# Patient Record
Sex: Female | Born: 1935
Health system: Southern US, Community
[De-identification: ages and names within clinical notes are randomized; demographics above are authoritative.]

## PROBLEM LIST (undated history)

## (undated) DIAGNOSIS — E785 Hyperlipidemia, unspecified: Secondary | ICD-10-CM

## (undated) DIAGNOSIS — K219 Gastro-esophageal reflux disease without esophagitis: Secondary | ICD-10-CM

## (undated) DIAGNOSIS — G8929 Other chronic pain: Secondary | ICD-10-CM

## (undated) DIAGNOSIS — F0390 Unspecified dementia without behavioral disturbance: Secondary | ICD-10-CM

## (undated) DIAGNOSIS — I10 Essential (primary) hypertension: Secondary | ICD-10-CM

## (undated) DIAGNOSIS — R519 Headache, unspecified: Secondary | ICD-10-CM

## (undated) DIAGNOSIS — C4490 Unspecified malignant neoplasm of skin, unspecified: Secondary | ICD-10-CM

## (undated) DIAGNOSIS — I219 Acute myocardial infarction, unspecified: Secondary | ICD-10-CM

## (undated) DIAGNOSIS — J449 Chronic obstructive pulmonary disease, unspecified: Secondary | ICD-10-CM

## (undated) DIAGNOSIS — I639 Cerebral infarction, unspecified: Secondary | ICD-10-CM

## (undated) DIAGNOSIS — E079 Disorder of thyroid, unspecified: Secondary | ICD-10-CM

## (undated) DIAGNOSIS — C801 Malignant (primary) neoplasm, unspecified: Secondary | ICD-10-CM

## (undated) HISTORY — DX: Chronic obstructive pulmonary disease, unspecified: J44.9

## (undated) HISTORY — DX: Other chronic pain: G89.29

## (undated) HISTORY — DX: Gastro-esophageal reflux disease without esophagitis: K21.9

## (undated) HISTORY — DX: Cerebral infarction, unspecified: I63.9

## (undated) HISTORY — DX: Headache, unspecified: R51.9

## (undated) HISTORY — DX: Essential (primary) hypertension: I10

## (undated) HISTORY — DX: Malignant (primary) neoplasm, unspecified: C80.1

## (undated) HISTORY — DX: Unspecified malignant neoplasm of skin, unspecified: C44.90

## (undated) HISTORY — DX: Disorder of thyroid, unspecified: E07.9

## (undated) HISTORY — DX: Unspecified dementia, unspecified severity, without behavioral disturbance, psychotic disturbance, mood disturbance, and anxiety: F03.90

## (undated) HISTORY — DX: Acute myocardial infarction, unspecified: I21.9

## (undated) HISTORY — DX: Hyperlipidemia, unspecified: E78.5

---

## 1973-01-25 HISTORY — PX: DILATION AND CURETTAGE OF UTERUS: SHX78

## 1991-01-26 HISTORY — PX: GALLBLADDER SURGERY: SHX652

## 1993-01-25 HISTORY — PX: BREAST LUMPECTOMY: SHX2

## 1996-01-26 DIAGNOSIS — I219 Acute myocardial infarction, unspecified: Secondary | ICD-10-CM

## 1996-01-26 HISTORY — DX: Acute myocardial infarction, unspecified: I21.9

## 1997-12-09 ENCOUNTER — Other Ambulatory Visit: Admission: RE | Admit: 1997-12-09 | Discharge: 1997-12-09 | Payer: Self-pay | Admitting: Family Medicine

## 1998-06-17 ENCOUNTER — Inpatient Hospital Stay (HOSPITAL_COMMUNITY): Admission: EM | Admit: 1998-06-17 | Discharge: 1998-06-20 | Payer: Self-pay | Admitting: Emergency Medicine

## 1998-06-17 ENCOUNTER — Encounter: Payer: Self-pay | Admitting: Family Medicine

## 1998-06-18 ENCOUNTER — Encounter: Payer: Self-pay | Admitting: General Surgery

## 1998-12-11 ENCOUNTER — Other Ambulatory Visit: Admission: RE | Admit: 1998-12-11 | Discharge: 1998-12-11 | Payer: Self-pay | Admitting: Family Medicine

## 1999-01-05 ENCOUNTER — Encounter: Admission: RE | Admit: 1999-01-05 | Discharge: 1999-01-05 | Payer: Self-pay | Admitting: Family Medicine

## 1999-01-05 ENCOUNTER — Encounter: Payer: Self-pay | Admitting: Family Medicine

## 2000-01-07 ENCOUNTER — Encounter: Payer: Self-pay | Admitting: Family Medicine

## 2000-01-07 ENCOUNTER — Encounter: Admission: RE | Admit: 2000-01-07 | Discharge: 2000-01-07 | Payer: Self-pay | Admitting: Family Medicine

## 2001-01-09 ENCOUNTER — Encounter: Admission: RE | Admit: 2001-01-09 | Discharge: 2001-01-09 | Payer: Self-pay | Admitting: Family Medicine

## 2001-01-09 ENCOUNTER — Encounter: Payer: Self-pay | Admitting: Family Medicine

## 2002-01-10 ENCOUNTER — Encounter: Admission: RE | Admit: 2002-01-10 | Discharge: 2002-01-10 | Payer: Self-pay | Admitting: Family Medicine

## 2002-01-10 ENCOUNTER — Encounter: Payer: Self-pay | Admitting: Family Medicine

## 2003-01-15 ENCOUNTER — Encounter: Payer: Self-pay | Admitting: Family Medicine

## 2003-05-21 ENCOUNTER — Inpatient Hospital Stay (HOSPITAL_COMMUNITY): Payer: Self-pay | Admitting: Internal Medicine

## 2004-01-07 ENCOUNTER — Ambulatory Visit: Payer: Self-pay | Admitting: Family Medicine

## 2004-01-24 ENCOUNTER — Encounter: Payer: Self-pay | Admitting: Family Medicine

## 2004-02-24 ENCOUNTER — Ambulatory Visit: Payer: Self-pay | Admitting: Family Medicine

## 2004-03-09 ENCOUNTER — Ambulatory Visit: Payer: Self-pay | Admitting: Internal Medicine

## 2004-03-23 ENCOUNTER — Ambulatory Visit: Payer: Self-pay | Admitting: Internal Medicine

## 2004-11-05 ENCOUNTER — Ambulatory Visit: Payer: Self-pay | Admitting: Family Medicine

## 2005-02-15 ENCOUNTER — Encounter: Payer: Self-pay | Admitting: Family Medicine

## 2005-02-22 ENCOUNTER — Ambulatory Visit: Payer: Self-pay | Admitting: Family Medicine

## 2005-07-19 ENCOUNTER — Ambulatory Visit: Payer: Self-pay | Admitting: Internal Medicine

## 2005-07-26 ENCOUNTER — Ambulatory Visit: Payer: Self-pay | Admitting: Family Medicine

## 2005-08-16 ENCOUNTER — Ambulatory Visit: Payer: Self-pay | Admitting: Family Medicine

## 2005-12-09 ENCOUNTER — Ambulatory Visit: Payer: Self-pay | Admitting: Family Medicine

## 2006-02-21 ENCOUNTER — Ambulatory Visit: Payer: Self-pay | Admitting: Family Medicine

## 2006-02-21 LAB — CONVERTED CEMR LAB
AST: 23 units/L (ref 0–37)
BUN: 12 mg/dL (ref 6–23)
Basophils Relative: 0.9 % (ref 0.0–1.0)
Direct LDL: 140.6 mg/dL
Eosinophils Absolute: 0.1 10*3/uL (ref 0.0–0.6)
HCT: 42.8 % (ref 36.0–46.0)
Hemoglobin: 14.7 g/dL (ref 12.0–15.0)
Lymphocytes Relative: 34.7 % (ref 12.0–46.0)
MCHC: 34.3 g/dL (ref 30.0–36.0)
Neutrophils Relative %: 55.2 % (ref 43.0–77.0)
Potassium: 3.6 meq/L (ref 3.5–5.1)
RBC: 4.5 M/uL (ref 3.87–5.11)
RDW: 13.7 % (ref 11.5–14.6)
TSH: 4.64 microintl units/mL (ref 0.35–5.50)
WBC: 5.6 10*3/uL (ref 4.5–10.5)

## 2006-02-28 ENCOUNTER — Encounter: Payer: Self-pay | Admitting: Family Medicine

## 2006-10-07 DIAGNOSIS — K219 Gastro-esophageal reflux disease without esophagitis: Secondary | ICD-10-CM | POA: Insufficient documentation

## 2006-10-07 DIAGNOSIS — F0391 Unspecified dementia with behavioral disturbance: Secondary | ICD-10-CM

## 2006-10-07 DIAGNOSIS — J449 Chronic obstructive pulmonary disease, unspecified: Secondary | ICD-10-CM | POA: Insufficient documentation

## 2006-10-07 DIAGNOSIS — I10 Essential (primary) hypertension: Secondary | ICD-10-CM

## 2006-10-07 DIAGNOSIS — J441 Chronic obstructive pulmonary disease with (acute) exacerbation: Secondary | ICD-10-CM | POA: Insufficient documentation

## 2006-10-21 ENCOUNTER — Telehealth: Payer: Self-pay | Admitting: Family Medicine

## 2006-11-17 ENCOUNTER — Ambulatory Visit: Payer: Self-pay | Admitting: Family Medicine

## 2006-12-29 ENCOUNTER — Ambulatory Visit: Payer: Self-pay | Admitting: Family Medicine

## 2006-12-30 DIAGNOSIS — J069 Acute upper respiratory infection, unspecified: Secondary | ICD-10-CM | POA: Insufficient documentation

## 2007-02-23 ENCOUNTER — Ambulatory Visit: Payer: Self-pay | Admitting: Family Medicine

## 2007-02-23 LAB — CONVERTED CEMR LAB
Alkaline Phosphatase: 93 units/L (ref 39–117)
BUN: 13 mg/dL (ref 6–23)
Basophils Absolute: 0 10*3/uL (ref 0.0–0.1)
Bilirubin Urine: NEGATIVE
Calcium: 9.8 mg/dL (ref 8.4–10.5)
Chloride: 105 meq/L (ref 96–112)
Eosinophils Relative: 2.1 % (ref 0.0–5.0)
GFR calc Af Amer: 56 mL/min
Hemoglobin: 14.4 g/dL (ref 12.0–15.0)
Ketones, urine, test strip: NEGATIVE
Lymphocytes Relative: 35.8 % (ref 12.0–46.0)
MCHC: 33.2 g/dL (ref 30.0–36.0)
Neutro Abs: 3.2 10*3/uL (ref 1.4–7.7)
Neutrophils Relative %: 53.1 % (ref 43.0–77.0)
Platelets: 170 10*3/uL (ref 150–400)
Protein, U semiquant: NEGATIVE
RDW: 13.7 % (ref 11.5–14.6)
Sodium: 147 meq/L — ABNORMAL HIGH (ref 135–145)
TSH: 3.87 microintl units/mL (ref 0.35–5.50)
Total Bilirubin: 0.8 mg/dL (ref 0.3–1.2)
Total Protein: 7.1 g/dL (ref 6.0–8.3)

## 2007-03-09 ENCOUNTER — Ambulatory Visit: Payer: Self-pay | Admitting: Family Medicine

## 2007-03-20 ENCOUNTER — Encounter: Payer: Self-pay | Admitting: Family Medicine

## 2007-04-25 ENCOUNTER — Emergency Department (HOSPITAL_COMMUNITY): Payer: Self-pay | Admitting: Emergency Medicine

## 2007-04-25 ENCOUNTER — Ambulatory Visit: Payer: Self-pay | Admitting: Family Medicine

## 2007-05-02 ENCOUNTER — Ambulatory Visit: Payer: Self-pay | Admitting: Family Medicine

## 2007-05-02 ENCOUNTER — Telehealth: Payer: Self-pay | Admitting: Family Medicine

## 2007-09-22 ENCOUNTER — Ambulatory Visit: Payer: Self-pay | Admitting: Internal Medicine

## 2007-09-22 DIAGNOSIS — G459 Transient cerebral ischemic attack, unspecified: Secondary | ICD-10-CM

## 2007-09-26 ENCOUNTER — Ambulatory Visit: Payer: Self-pay

## 2007-09-29 ENCOUNTER — Ambulatory Visit: Payer: Self-pay | Admitting: Family Medicine

## 2007-10-27 ENCOUNTER — Ambulatory Visit: Payer: Self-pay | Admitting: Family Medicine

## 2008-02-26 ENCOUNTER — Ambulatory Visit: Payer: Self-pay | Admitting: Family Medicine

## 2008-02-26 LAB — CONVERTED CEMR LAB
Glucose, Urine, Semiquant: NEGATIVE
Ketones, urine, test strip: NEGATIVE
Nitrite: NEGATIVE
Protein, U semiquant: NEGATIVE
pH: 5.5

## 2008-02-27 LAB — CONVERTED CEMR LAB
ALT: 17 units/L (ref 0–35)
Alkaline Phosphatase: 74 units/L (ref 39–117)
Bilirubin, Direct: 0.2 mg/dL (ref 0.0–0.3)
CO2: 29 meq/L (ref 19–32)
Calcium: 9.5 mg/dL (ref 8.4–10.5)
Eosinophils Relative: 2.2 % (ref 0.0–5.0)
Glucose, Bld: 92 mg/dL (ref 70–99)
Hemoglobin: 14.8 g/dL (ref 12.0–15.0)
Lymphocytes Relative: 32.2 % (ref 12.0–46.0)
Monocytes Relative: 6.1 % (ref 3.0–12.0)
Neutro Abs: 3.6 10*3/uL (ref 1.4–7.7)
Potassium: 3.9 meq/L (ref 3.5–5.1)
RDW: 13.7 % (ref 11.5–14.6)
Sodium: 141 meq/L (ref 135–145)
Total Protein: 7.2 g/dL (ref 6.0–8.3)
WBC: 6 10*3/uL (ref 4.5–10.5)

## 2008-03-28 ENCOUNTER — Encounter: Payer: Self-pay | Admitting: Family Medicine

## 2008-04-08 ENCOUNTER — Ambulatory Visit: Payer: Self-pay | Admitting: Family Medicine

## 2008-10-21 ENCOUNTER — Ambulatory Visit: Payer: Self-pay | Admitting: Family Medicine

## 2008-10-21 ENCOUNTER — Encounter: Payer: Self-pay | Admitting: Family Medicine

## 2009-03-10 ENCOUNTER — Ambulatory Visit: Payer: Self-pay | Admitting: Family Medicine

## 2009-03-10 LAB — CONVERTED CEMR LAB
AST: 22 units/L (ref 0–37)
Alkaline Phosphatase: 89 units/L (ref 39–117)
BUN: 22 mg/dL (ref 6–23)
Basophils Absolute: 0 10*3/uL (ref 0.0–0.1)
Calcium: 9.7 mg/dL (ref 8.4–10.5)
GFR calc non Af Amer: 45.86 mL/min (ref >60)
Glucose, Bld: 97 mg/dL (ref 70–99)
Lymphocytes Relative: 29 % (ref 12.0–46.0)
Lymphs Abs: 1.8 10*3/uL (ref 0.7–4.0)
Monocytes Relative: 6.6 % (ref 3.0–12.0)
Platelets: 197 10*3/uL (ref 150.0–400.0)
RDW: 13.9 % (ref 11.5–14.6)
TSH: 5.98 microintl units/mL — ABNORMAL HIGH (ref 0.35–5.50)
Total Bilirubin: 0.5 mg/dL (ref 0.3–1.2)

## 2009-04-21 ENCOUNTER — Encounter: Payer: Self-pay | Admitting: Family Medicine

## 2009-07-20 ENCOUNTER — Emergency Department (HOSPITAL_COMMUNITY): Payer: Self-pay | Admitting: Family Medicine

## 2009-07-31 ENCOUNTER — Ambulatory Visit: Payer: Self-pay | Admitting: Family Medicine

## 2009-08-01 DIAGNOSIS — M549 Dorsalgia, unspecified: Secondary | ICD-10-CM

## 2009-09-01 ENCOUNTER — Ambulatory Visit: Payer: Self-pay | Admitting: Family Medicine

## 2009-12-01 ENCOUNTER — Ambulatory Visit: Payer: Self-pay | Admitting: Family Medicine

## 2010-01-22 ENCOUNTER — Telehealth: Payer: Self-pay | Admitting: Family Medicine

## 2010-02-14 ENCOUNTER — Encounter: Payer: Self-pay | Admitting: Family Medicine

## 2010-02-24 NOTE — Assessment & Plan Note (Signed)
Summary: flu shot/njr   Nurse Visit   Allergies: 1)  ! Codeine Sulfate (Codeine Sulfate) 2)  ! * Antihistamine Group  Orders Added: 1)  Flu Vaccine 2yrs + MEDICARE PATIENTS [Q2039] 2)  Administration Flu vaccine - MCR [G0008] Flu Vaccine Consent Questions     Do you have a history of severe allergic reactions to this vaccine? no    Any prior history of allergic reactions to egg and/or gelatin? no    Do you have a sensitivity to the preservative Thimersol? no    Do you have a past history of Guillan-Barre Syndrome? no    Do you currently have an acute febrile illness? no    Have you ever had a severe reaction to latex? no    Vaccine information given and explained to patient? yes    Are you currently pregnant? no    Lot Number:[id number]   Exp Date:07/25/2010   Site Given  Left Deltoid [email or web address]

## 2010-02-24 NOTE — Assessment & Plan Note (Signed)
Summary: pt will come in fasting/njr   Vital Signs:  Patient profile:   77 year old female Height:      65 inches Weight:      156 pounds BMI:     26.05 Temp:     98.0 degrees F oral BP sitting:   172 / 98  (left arm) Cuff size:   regular  Vitals Entered By: Kern Reap CMA Duncan Dull) (March 10, 2009 8:45 AM)  Reason for Visit cpx  History of Present Illness: Donna Arias is a 77 year old female, married, nonsmoker, who comes in today accompanied by her daughter, Donna Arias, who is her primary caregiver.  She lives with her husband Donna Arias and Donna Arias handles all there affairs.  We discussed the concepts of health care power-of-attorney, financial power-of-attorney and living wills.  Donna Arias is working with an Pensions consultant to make that happen as we speak.  Her dementia is to you with risperidone 1 mg b.i.d. doing well.  Her hypertension.  History with atenolol 50 mg daily BP at home 137/84.  Last night.  She also has venous insufficiency for which he takes Lasix 30 mg daily and one potassium tablet daily.  She gets routine eye care.  She has an upper dental plate.  Able to food okay.  Tetanus 2010 seasonal flu 2010 Pneumovax 2005.  Allergies: 1)  ! Codeine Sulfate (Codeine Sulfate) 2)  ! * Antihistamine Group  Past History:  Past medical, surgical, family and social histories (including risk factors) reviewed, and no changes noted (except as noted below).  Past Medical History: Reviewed history from 10/07/2006 and no changes required. COPD Dementia GERD Hypertension  Past Surgical History: Reviewed history from 10/07/2006 and no changes required. CB X1  Family History: Reviewed history and no changes required.  Social History: Reviewed history from 02/23/2007 and no changes required. Retired Married Former Smoker Alcohol use-no Drug use-no Regular exercise-no  Review of Systems      See HPI  Physical Exam  General:  Well-developed,well-nourished,in no acute distress;  alert,appropriate and cooperative throughout examination Head:  Normocephalic and atraumatic without obvious abnormalities. No apparent alopecia or balding. Eyes:  No corneal or conjunctival inflammation noted. EOMI. Perrla. Funduscopic exam benign, without hemorrhages, exudates or papilledema. Vision grossly normal.bilateral cataracts Ears:  External ear exam shows no significant lesions or deformities.  Otoscopic examination reveals clear canals, tympanic membranes are intact bilaterally without bulging, retraction, inflammation or discharge. Hearing is grossly normal bilaterally. Nose:  External nasal examination shows no deformity or inflammation. Nasal mucosa are pink and moist without lesions or exudates. Mouth:  Oral mucosa and oropharynx without lesions or exudates.  Teeth in good repair. Neck:  No deformities, masses, or tenderness noted. Chest Wall:  No deformities, masses, or tenderness noted. Breasts:  No mass, nodules, thickening, tenderness, bulging, retraction, inflamation, nipple discharge or skin changes noted.   Lungs:  Normal respiratory effort, chest expands symmetrically. Lungs are clear to auscultation, no crackles or wheezes. Heart:  Normal rate and regular rhythm. S1 and S2 normal without gallop, murmur, click, rub or other extra sounds. Abdomen:  Bowel sounds positive,abdomen soft and non-tender without masses, organomegaly or hernias noted. Msk:  No deformity or scoliosis noted of thoracic or lumbar spine.   Pulses:  R and L carotid,radial,femoral,dorsalis pedis and posterior tibial pulses are full and equal bilaterally Extremities:  No clubbing, cyanosis, edema, or deformity noted with normal full range of motion of all joints.   Skin:  Intact without suspicious lesions or rashes Cervical Nodes:  No lymphadenopathy noted Axillary Nodes:  No palpable lymphadenopathy Inguinal Nodes:  No significant adenopathy Psych:  Cognition and judgment appear intact. Alert and  cooperative with normal attention span and concentration. No apparent delusions, illusions, hallucinations   Impression & Recommendations:  Problem # 1:  HYPERTENSION (ICD-401.9) Assessment Improved  Her updated medication list for this problem includes:    Furosemide 20 Mg Tabs (Furosemide) .[name]... 1 1/2 tabs once daily    Atenolol 50 Mg Tabs (Atenolol) .[name]... Take 1 tablet by mouth every morning  Orders: Venipuncture ([address]) Prescription Created Electronically [phone]) EKG w/ Interpretation (93000) TLB-BMP (Basic Metabolic Panel-BMET) (80048-METABOL) TLB-CBC Platelet - w/Differential (85025-CBCD) TLB-Hepatic/Liver Function Pnl (80076-HEPATIC) TLB-TSH (Thyroid Stimulating Hormone) (84443-TSH)  Problem # 2:  GERD (ICD-530.81) Assessment: Unchanged  Problem # 3:  COPD (ICD-496) Assessment: Unchanged  Orders: Venipuncture (09811) Prescription Created Electronically [id number]) TLB-BMP (Basic Metabolic Panel-BMET) (80048-METABOL) TLB-CBC Platelet - w/Differential (85025-CBCD) TLB-Hepatic/Liver Function Pnl (80076-HEPATIC) TLB-TSH (Thyroid Stimulating Hormone) (84443-TSH)  Complete Medication List: 1)  Furosemide 20 Mg Tabs (Furosemide) .[name].. 1 1/2 tabs once daily 2)  Klor-con M20 20 Meq Tbcr (Potassium chloride crys cr) .... Take 1 tablet by mouth once a day 3)  Risperdal 1 Mg Tabs (Risperidone) .... Take 1 tablet by mouth two times a day 4)  Tylenol Extra Strength 500 Mg Tabs (Acetaminophen) .... One hs as needed 5)  Atenolol 50 Mg Tabs (Atenolol) .... Take 1 tablet by mouth every morning  Patient Instructions: 1)  Please schedule a follow-up appointment in 1 year. Prescriptions: ATENOLOL 50 MG  TABS (ATENOLOL) Take 1 tablet by mouth every morning  #100 x 4   Entered and Authorized by:   Roderick Pee MD   Signed by:   Roderick Pee MD on 03/10/2009   Method used:   [facility] [address] [phone]* (retail)       [address]       [place], [place]  [address]       Ph: [phone] or [phone]       Fax: [phone]   RxID:   [id number] RISPERDAL 1 MG  TABS (RISPERIDONE) Take 1 tablet by mouth two times a day  #200 x 4   Entered and Authorized by:   Roderick Pee MD   Signed by:   Roderick Pee MD on 03/10/2009   Method used:   [facility]        [facility]  [address] [phone]* (retail)       [address]       [place], [place]  [address]       Ph: [phone] or [phone]       Fax: [phone]   RxID:   [id number] KLOR-CON M20 20 MEQ TBCR (POTASSIUM CHLORIDE CRYS CR) Take 1 tablet by mouth once a day  #100 x 4   Entered and Authorized by:   Roderick Pee MD   Signed by:   Roderick Pee MD on 03/10/2009   Method used:   [facility]        [facility] [phone]* (retail)       [address]       [place], [place]  [address]       Ph: [phone] or  [phone]       Fax: [phone]   RxID:   [id number] FUROSEMIDE 20 MG TABS (FUROSEMIDE) 1 1/2 TABS once daily  #150 x 4   Entered and Authorized by:   Roderick Pee MD   Signed by:   Roderick Pee MD on 03/10/2009   Method used:   [facility]        [facility] [phone]* (retail)       [address]       [place], [place]  [address]       Ph: [phone] or [phone]       Fax: [phone]   RxID:   [id number]

## 2010-02-24 NOTE — Assessment & Plan Note (Signed)
Summary: 1 month rov/njr   Vital Signs:  Patient profile:   77 year old female Weight:      168 pounds Temp:     98.3 degrees F oral Pulse rate:   60 / minute BP sitting:   110 / 70  (right arm) Cuff size:   regular  Vitals Entered By: Romualdo Bolk, CMA (AAMA) (September 01, 2009 8:51 AM) CC: follow-up visit   CC:  follow-up visit.  History of Present Illness: Mrs. sharp is a 77 year old, married female, nonsmoker, who comes in today for follow-up of hypertension.  We have adjusted her medication.  Now her blood pressure is back to normal.  It's averaging 135/85.  No hypotension.  Preventive Screening-Counseling & Management  Alcohol-Tobacco     Smoking Status: quit     Year Quit: 1993  Caffeine-Diet-Exercise     Does Patient Exercise: no  Current Medications (verified): 1)  Furosemide 20 Mg Tabs (Furosemide) .[name].. 1 1/2 Tabs Once Daily 2)  Klor-Con M20 20 Meq Tbcr (Potassium Chloride Crys Cr) .... Take 1 Tablet By Mouth Once A Day 3)  Risperdal 1 Mg  Tabs (Risperidone) .... Take 1 Tablet By Mouth Two Times A Day 4)  Tylenol Extra Strength 500 Mg  Tabs (Acetaminophen) .... One Hs As Needed 5)  Atenolol 50 Mg  Tabs (Atenolol) .[name].. 1 & 1/2 Qam 6)  Vicodin Es 7.5-750 Mg Tabs (Hydrocodone-Acetaminophen) .... 1/2 Three Times A Day As Needed 7)  Flexeril 5 Mg Tabs (Cyclobenzaprine Hcl) .... 1/2 Three Times A Day As Needed  Allergies (verified): 1)  ! Codeine Sulfate (Codeine Sulfate) 2)  ! * Antihistamine Group PMH-FH-SH reviewed for relevance  Social History: Reviewed history from 02/23/2007 and no changes required. Retired Married Former Smoker Alcohol use-no Drug use-no Regular exercise-no  Review of Systems      See HPI  Physical Exam  General:  Well-developed,well-nourished,in no acute distress; alert,appropriate and cooperative throughout examination Heart:  140/85   Impression & Recommendations:  Problem # 1:  HYPERTENSION (ICD-401.9) Assessment  Improved  Her updated medication list for this problem includes:    Furosemide 20 Mg Tabs (Furosemide) .[name]... 1 1/2 tabs once daily    Atenolol 50 Mg Tabs (Atenolol) .[name]... 1 & 1/2 qam  Complete Medication List: 1)  Furosemide 20 Mg Tabs (Furosemide) .[name].. 1 1/2 tabs once daily 2)  Klor-con M20 20 Meq Tbcr (Potassium chloride crys cr) .... Take 1 tablet by mouth once a day 3)  Risperdal 1 Mg Tabs (Risperidone) .... Take 1 tablet by mouth two times a day 4)  Tylenol Extra Strength 500 Mg Tabs (Acetaminophen) .... One hs as needed 5)  Atenolol 50 Mg Tabs (Atenolol) .[name].. 1 & 1/2 qam 6)  Vicodin Es 7.5-750 Mg Tabs (Hydrocodone-acetaminophen) .... 1/2 three times a day as needed 7)  Flexeril 5 Mg Tabs (Cyclobenzaprine hcl) .... 1/2 three times a day as needed  Patient Instructions: 1)  continue your current medications.  Check your blood pressure weekly.  Return p.r.n.

## 2010-02-24 NOTE — Assessment & Plan Note (Signed)
Summary: severe neck pain/cjr   Vital Signs:  Patient profile:   77 year old female Weight:      167 pounds Temp:     97.8 degrees F oral BP sitting:   212 / 150  (right arm) Cuff size:   regular  Vitals Entered By: Kathrynn Speed CMA (July 31, 2009 4:30 PM) CC: severe neck pain, src   CC:  severe neck pain and src.  History of Present Illness: brady Donna Arias is here today for evaluation of two problems.  A couple weeks ago.  Her daughter had to take her to an urgent care center for evaluation of severe neck pain.  There was no history of trauma.  She just woke up, turned her head and had severe pain.  Evaluation was negative.  She was told she had a muscle strain.  She was given Flexeril and some pain pills and now is free much pain free.  Neurologic review of systems negative.  Her blood pressure has been slowly creeping up over the last couple weeks.  Today is 212/150.  Married user.  Her medications, which are Lasix, 30 mg daily, and atenolol 50 mg daily.  Current Medications (verified): 1)  Furosemide 20 Mg Tabs (Furosemide) .[name].. 1 1/2 Tabs Once Daily 2)  Klor-Con M20 20 Meq Tbcr (Potassium Chloride Crys Cr) .... Take 1 Tablet By Mouth Once A Day 3)  Risperdal 1 Mg  Tabs (Risperidone) .... Take 1 Tablet By Mouth Two Times A Day 4)  Tylenol Extra Strength 500 Mg  Tabs (Acetaminophen) .... One Hs As Needed 5)  Atenolol 50 Mg  Tabs (Atenolol) .... Take 1 Tablet By Mouth Every Morning  Allergies (verified): 1)  ! Codeine Sulfate (Codeine Sulfate) 2)  ! * Antihistamine Group  Past History:  Past medical, surgical, family and social histories (including risk factors) reviewed for relevance to current acute and chronic problems.  Past Medical History: Reviewed history from 10/07/2006 and no changes required. COPD Dementia GERD Hypertension  Past Surgical History: Reviewed history from 10/07/2006 and no changes required. CB X1  Family History: Reviewed history and no  changes required.  Social History: Reviewed history from 02/23/2007 and no changes required. Retired Married Former Smoker Alcohol use-no Drug use-no Regular exercise-no  Review of Systems      See HPI  Physical Exam  General:  Well-developed,well-nourished,in no acute distress; alert,appropriate and cooperative throughout examination Msk:  No deformity or scoliosis noted of thoracic or lumbar spine.     Impression & Recommendations:  Problem # 1:  HYPERTENSION (ICD-401.9) Assessment Deteriorated  Her updated medication list for this problem includes:    Furosemide 20 Mg Tabs (Furosemide) .[name]... 1 1/2 tabs once daily    Atenolol 50 Mg Tabs (Atenolol) .[name]... 1 & 1/2 qam  Problem # 2:  CERVICAL DISC DISORDER (ICD-722.91) Assessment: New  Complete Medication List: 1)  Furosemide 20 Mg Tabs (Furosemide) .[name].. 1 1/2 tabs once daily 2)  Klor-con M20 20 Meq Tbcr (Potassium chloride crys cr) .... Take 1 tablet by mouth once a day 3)  Risperdal 1 Mg Tabs (Risperidone) .... Take 1 tablet by mouth two times a day 4)  Tylenol Extra Strength 500 Mg Tabs (Acetaminophen) .... One hs as needed 5)  Atenolol 50 Mg Tabs (Atenolol) .[name].. 1 & 1/2 qam 6)  Vicodin Es 7.5-750 Mg Tabs (Hydrocodone-acetaminophen) .... 1/2 three times a day as needed 7)  Flexeril 5 Mg Tabs (Cyclobenzaprine hcl) .... 1/2 three times a day  as needed  Other Orders: Prescription Created Electronically [id number])  Patient Instructions: 1)  1/2 .  Atenolol when you get home now,then starting tomorrow morning take a tablet a half every day.  Check blood pressure every morning.  Return in 4 weeks for follow-up. 2)  If you have a flare of the neck pain, take a half of a 5-mg Flexeril and a half of a Vicodin up to 3 times a day as needed Prescriptions: ATENOLOL 50 MG  TABS (ATENOLOL) 1 & 1/2 qam  #150 x 3   Entered and Authorized by:   Roderick Pee MD   Signed by:   Roderick Pee MD on 07/31/2009   Method used:    [facility]        [facility] [address] [phone]* (retail)       [address]       [place], [place]  [address]       Ph: [phone] or [phone]       Fax: [phone]   RxID:   [id number] FLEXERIL 5 MG TABS (CYCLOBENZAPRINE HCL) 1/2 three times a day as needed  #50 x 2   Entered and Authorized by:   Roderick Pee MD   Signed by:   Roderick Pee MD on 07/31/2009   Method used:   Print then Give to Patient   RxID:   [id number] VICODIN ES 7.5-750 MG TABS (HYDROCODONE-ACETAMINOPHEN) 1/2 three times a day as needed  #50 x 2   Entered and Authorized by:   Roderick Pee MD   Signed by:   Roderick Pee MD on 07/31/2009   Method used:   Print then Give to Patient   RxID:   [id number]

## 2010-02-26 NOTE — Progress Notes (Signed)
Summary: refill request  Phone Note Refill Request Message from:  Fax from Pharmacy on January 22, 2010 4:46 PM  Refills Requested: Medication #1:  RISPERDAL 1 MG  TABS Take 1 tablet by mouth two times a day  Medication #2:  ATENOLOL 50 MG  TABS 1 & 1/2 qam Initial call taken by: Kern Reap CMA Duncan Dull),  January 22, 2010 4:46 PM    Prescriptions: ATENOLOL 50 MG  TABS (ATENOLOL) 1 & 1/2 qam  #150 x 1   Entered by:   Kern Reap CMA (AAMA)   Authorized by:   Roderick Pee MD   Signed by:   Kern Reap CMA (AAMA) on 01/22/2010   Method used:   Electronically to        [facility]* (retail)       [address] [place], [place]  [address]       Ph: [phone] or [phone]       Fax: [phone]   RxID:   [id number] RISPERDAL 1 MG  TABS (RISPERIDONE) Take 1 tablet by mouth two times a day  #100 Tablet x 1   Entered by:   Kern Reap CMA (AAMA)   Authorized by:   Roderick Pee MD   Signed by:   Kern Reap CMA (AAMA) on 01/22/2010   Method used:   Electronically to        [facility]* (retail)       [address] [place], [place]  [address]       Ph: [phone] or [phone]       Fax: [phone]   RxID:   [id number]

## 2010-03-16 ENCOUNTER — Encounter: Payer: Self-pay | Admitting: Family Medicine

## 2010-03-16 ENCOUNTER — Ambulatory Visit (INDEPENDENT_AMBULATORY_CARE_PROVIDER_SITE_OTHER): Payer: Medicare Other | Admitting: Family Medicine

## 2010-03-16 VITALS — BP 140/92 | Temp 97.5°F | Ht 65.0 in | Wt 168.0 lb

## 2010-03-16 DIAGNOSIS — K219 Gastro-esophageal reflux disease without esophagitis: Secondary | ICD-10-CM

## 2010-03-16 DIAGNOSIS — E039 Hypothyroidism, unspecified: Secondary | ICD-10-CM

## 2010-03-16 DIAGNOSIS — I10 Essential (primary) hypertension: Secondary | ICD-10-CM

## 2010-03-16 DIAGNOSIS — F068 Other specified mental disorders due to known physiological condition: Secondary | ICD-10-CM

## 2010-03-16 LAB — POCT URINALYSIS DIPSTICK
Leukocytes, UA: NEGATIVE
Nitrite, UA: NEGATIVE
Protein, UA: NEGATIVE
Urobilinogen, UA: 0.2

## 2010-03-16 LAB — HEPATIC FUNCTION PANEL
AST: 17 U/L (ref 0–37)
Alkaline Phosphatase: 93 U/L (ref 39–117)
Bilirubin, Direct: 0.1 mg/dL (ref 0.0–0.3)

## 2010-03-16 LAB — TSH: TSH: 7.47 u[IU]/mL — ABNORMAL HIGH (ref 0.35–5.50)

## 2010-03-16 LAB — CBC WITH DIFFERENTIAL/PLATELET
Basophils Relative: 0.5 % (ref 0.0–3.0)
Eosinophils Absolute: 0.1 10*3/uL (ref 0.0–0.7)
Eosinophils Relative: 2 % (ref 0.0–5.0)
HCT: 43.6 % (ref 36.0–46.0)
Lymphs Abs: 1.9 10*3/uL (ref 0.7–4.0)
MCHC: 33.5 g/dL (ref 30.0–36.0)
MCV: 94.6 fl (ref 78.0–100.0)
Monocytes Absolute: 0.3 10*3/uL (ref 0.1–1.0)
Neutro Abs: 3.9 10*3/uL (ref 1.4–7.7)
RBC: 4.61 Mil/uL (ref 3.87–5.11)
WBC: 6.3 10*3/uL (ref 4.5–10.5)

## 2010-03-16 MED ORDER — POTASSIUM CHLORIDE CRYS ER 20 MEQ PO TBCR: 20.0000 meq | EXTENDED_RELEASE_TABLET | Freq: Every day | ORAL | Status: DC

## 2010-03-16 MED ORDER — ATENOLOL 50 MG PO TABS: 75.0000 mg | ORAL_TABLET | ORAL | Status: DC

## 2010-03-16 MED ORDER — FUROSEMIDE 20 MG PO TABS: 30.0000 mg | ORAL_TABLET | Freq: Every day | ORAL | Status: DC

## 2010-03-16 MED ORDER — RISPERIDONE 1 MG PO TABS: 1.0000 mg | ORAL_TABLET | Freq: Two times a day (BID) | ORAL | Status: DC

## 2010-03-16 NOTE — Patient Instructions (Signed)
Continue current medications follow-up in one year 

## 2010-03-16 NOTE — Progress Notes (Signed)
  Subjective:    Patient ID: Donna Arias, female    DOB: Feb 05, 1926, 77 y.o.   MRN: [id number]  HPI Donna Arias is an 77 year old, married female, nonsmoker, who comes in today accompanied by her daughter, Donna Arias and her husband Donna Arias for general medical examination.  She has a history of underlying hypertension, for which he takes Tenormin, 50 mg daily, Lasix 20 mg daily, and one potassium supplement.  BP 140/92.  She has an occasional bout of cervical neck pain, for which he takes Flexeril p.r.n.  She has some mild dementia, for which he takes Risperdal 1 mg.  Nightly.  Health maintenance activities.  Vaccinations, etc., all up to date.  Healthcare power of attorney, with married daughter   Review of Systems  Constitutional: Negative.   HENT: Negative.   Eyes: Negative.   Respiratory: Negative.   Cardiovascular: Negative.   Gastrointestinal: Negative.   Genitourinary: Negative.   Musculoskeletal: Negative.   Neurological: Negative.   Hematological: Negative.   Psychiatric/Behavioral: Negative.        Objective:   Physical Exam  Constitutional: She appears well-developed and well-nourished.  HENT:  Head: Normocephalic and atraumatic.  Right Ear: External ear normal.  Left Ear: External ear normal.  Nose: Nose normal.  Mouth/Throat: Oropharynx is clear and moist.  Eyes: EOM are normal. Pupils are equal, round, and reactive to light.  Neck: Normal range of motion. Neck supple. No thyromegaly present.  Cardiovascular: Normal rate, regular rhythm, normal heart sounds and intact distal pulses.  Exam reveals no gallop and no friction rub.   No murmur heard. Pulmonary/Chest: Effort normal and breath sounds normal.  Abdominal: Soft. Bowel sounds are normal. She exhibits no distension and no mass. There is no tenderness. There is no rebound.  Genitourinary: Vagina normal and uterus normal. Guaiac negative stool. No vaginal discharge found.  Musculoskeletal: Normal range of motion.    Lymphadenopathy:    She has no cervical adenopathy.  Neurological: She is alert. She has normal reflexes. No cranial nerve deficit. She exhibits normal muscle tone. Coordination normal.  Skin: Skin is warm and dry.  Psychiatric: She has a normal mood and affect. Her behavior is normal. Judgment and thought content normal.          Assessment & Plan:  Hypertension continue medications.  Cervical neck pain, and Flexeril p.r.n.  Mild dementia.  Continue respond all 1 mg daily.

## 2010-03-17 MED ORDER — LEVOTHYROXINE SODIUM 50 MCG PO TABS: 50.0000 ug | ORAL_TABLET | Freq: Every day | ORAL | Status: DC

## 2010-03-17 NOTE — Progress Notes (Signed)
Addended by: Kern Reap on: 03/17/2010 08:44 AM   Modules accepted: Orders

## 2010-03-23 ENCOUNTER — Ambulatory Visit (INDEPENDENT_AMBULATORY_CARE_PROVIDER_SITE_OTHER): Payer: Medicare Other | Admitting: Family Medicine

## 2010-03-23 ENCOUNTER — Encounter: Payer: Self-pay | Admitting: Family Medicine

## 2010-03-23 VITALS — BP 140/78 | Temp 97.4°F | Wt 168.0 lb

## 2010-03-23 DIAGNOSIS — H900 Conductive hearing loss, bilateral: Secondary | ICD-10-CM

## 2010-03-23 DIAGNOSIS — H6120 Impacted cerumen, unspecified ear: Secondary | ICD-10-CM

## 2010-03-23 NOTE — Progress Notes (Signed)
  Subjective:    Patient ID: Donna Arias, female    DOB: 08-Jul-1926, 77 y.o.   MRN: [id number]  HPI Donna Arias Is a 77 year old, married female, nonsmoker, who comes in today for evaluation of hearing loss.  She noticed gradual hearing loss of the last couple months.  She has a history of cerumen impactions   Review of Systems    Negative Objective:   Physical Exam Well-developed well-nourished, female, in no acute distress.  Examination of the ears shows bilateral cerumen impactions, which were removed by irrigation.  Once the cerumen was removed.  The ear canals were appear normal.  Eardrums normal and her hearing improved       Assessment & Plan:  Hearing loss, secondary to cerumen impactions,,,,,,,,,,,,, resolved

## 2010-03-23 NOTE — Patient Instructions (Signed)
Return prn 

## 2010-06-12 NOTE — Discharge Summary (Signed)
Donna Arias, Donna Arias                          ACCOUNT NO.:  [id number]   MEDICAL RECORD NO.:  [id number]                   PATIENT TYPE:  INP   LOCATION:  5735                                 FACILITY:  [facility]   PHYSICIAN:  Rene Paci, M.D. [name]          DATE OF BIRTH:  Jul 26, 1926   DATE OF ADMISSION:  05/21/2003  DATE OF DISCHARGE:  05/22/2003                                 DISCHARGE SUMMARY   DISCHARGE DIAGNOSES:  1. Inability to walk.  2. Avulsion-type fracture of the medial malleolus.  3. Hypotension.  4. Hypokalemia.  5. Mild leukocytosis.   BRIEF ADMISSION HISTORY:  Donna Arias is a 77 year old white female who  states she fell and lost her balance.  She landed on her right knee and  twisted her left ankle.  This occurred on May 20, 2003.  She slept with  ice packs on it during the night and took hydrocodone for pain, but she  continued to have pain and was unable to bear weight on her left foot.  The  patient presented to the emergency department where plain films of her left  ankle revealed avulsion-type fracture of the medial malleolus with  associated soft tissue swelling and edema.   PAST MEDICAL HISTORY:  1. History of a slipped disk diagnosed clinically in the office by Dr.     Eugenio Hoes. Todd.  2. Dementia.  3. History of TIA.  4. Hypertension.  5. Status post cholecystectomy.  6. Status post left mastectomy.   HOSPITAL COURSE:  PROBLEM #1 - ORTHOPEDICS:  The patient fell, sustaining a  left ankle fracture.  The patient was seen in the emergency department by  orthopedist from Dr. Lenard Galloway[facility].  They recommended placing  her in a left Cam walker.  They made recommendations for outpatient  followup.  The patient was seen by physical therapy.  They recommended  rolling walker and home health PT.  This has been arranged.  Currently, the  patient's pain is controlled.   PROBLEM #2 - HYPOTENSION:  The patient has some relative hypotension  with a  blood pressure of 96/49 during this admission.  The patient's  antihypertensives were held.  The patient was asymptomatic.   PROBLEM #3 - HYPOKALEMIA:  This is likely secondary to her diuretics and we  have been replacing.   PROBLEM #4 - INFECTIOUS DISEASE:  The patient was afebrile but had a mild  white count of 12,000.  There is no evidence of infection and we suspect  this may be secondary to her orthopedic injury.   LABORATORIES AT DISCHARGE:  White count 12.2, hemoglobin 14, potassium 2.6,  BUN 28, creatinine 1.2.  TSH is still pending.   MEDICATIONS AT DISCHARGE:  1. Atenolol and chlorthalidone 50/25 mg daily.  2. Risperidone 1 mg 1/2 in the morning, 1 in the evening.  3. Lasix 20 mg daily.  4. Vicodin and Flexeril as needed.  5. Prednisone as instructed as at home.  6. Potassium 20 mEq daily.   FOLLOWUP:  Follow up with Dr. Tawanna Cooler within 1-2 weeks and follow up with  orthopedist on Thursday, May 23, 2003, as previously instructed.      Cornell Barman, P.A. [facility]                  Rene Paci, M.D. [facility]    LC/MEDQ  D:  05/22/2003  T:  05/22/2003  Job:  [id number]   cc:   Tinnie Gens A. Tawanna Cooler, M.D. [facility]   Thereasa Distance A. Chaney Malling, M.D.  [address] [place]  [place] [address]  Fax: [phone]

## 2010-06-12 NOTE — Consult Note (Signed)
NAME:  Donna Arias, Donna Arias                          ACCOUNT NO.:  [id number]   MEDICAL RECORD NO.:  [id number]                   PATIENT TYPE:  EMS   LOCATION:  [facility]                                 FACILITY:  [facility]   PHYSICIAN:  Mila Homer. Sherlean Foot, M.D.              DATE OF BIRTH:  24-Sep-1926   DATE OF CONSULTATION:  05/21/2003  DATE OF DISCHARGE:                                   CONSULTATION   ADMITTING DIAGNOSIS:  Left ankle pain, right knee pain, giving way, shaking,  weakness.   HISTORY OF PRESENT ILLNESS:  The patient is a 77 year old white female  patient of Dr. Nelida Meuse and has previously been seen by  Dr. Chaney Malling, my partner.  They spoke with Dr. Kelle Darting this morning  regarding her dizziness, weakness, and shaking yesterday.  Evidently, she  has been worked up for a slipped disk in her back.  She also has some  question of whether there is a right knee problem.  She has not been to see  Dr. Chaney Malling recently.  She has a long list of medical problems and  medications listed in the chart.   PHYSICAL EXAMINATION:  GENERAL:  Well-nourished, well-developed and in  minimal distress.  EXTREMITIES:  The left leg is bruised medially and laterally but stable.  X-  rays show a small type 3 ankle sprain.  The right knee is ligamentously  stable with no swelling, full range of motion, and good strength and there  is neurovascularly intact.   IMPRESSION:  Grade 3 ankle sprain of left ankle.  The right knee is  ligamentously stable and strong.  I think that this could be coming from  lumbar pathology or other systemic condition.   TREATMENT:  She needs an equalizer on the left ankle and Dr. Tawanna Cooler is coming  to the emergency room to evaluate her.  Followup with me will most likely  either be in the hospital if he admits her or in the office.  In my opinion,  she needs x-rays and an MRI scan of the lumbar spine as well as MRI scan of  the right knee.               Mila Homer. Sherlean Foot, M.D.    SDL/MEDQ  D:  05/21/2003  T:  05/21/2003  Job:  [id number]

## 2010-10-19 LAB — POCT CARDIAC MARKERS
Operator id: 277751
Troponin i, poc: <0.05

## 2010-10-19 LAB — DIFFERENTIAL
Basophils Absolute: 0
Basophils Relative: 1
Eosinophils Absolute: 0.1
Eosinophils Relative: 1
Lymphocytes Relative: 30
Lymphs Abs: 2.3
Monocytes Absolute: 0.7
Monocytes Relative: 9
Neutro Abs: 4.5
Neutrophils Relative %: 59

## 2010-10-19 LAB — CBC
HCT: 40.8
Platelets: 160
RDW: 14.6

## 2010-10-19 LAB — URINALYSIS, ROUTINE W REFLEX MICROSCOPIC
Bilirubin Urine: NEGATIVE
Glucose, UA: NEGATIVE
Hgb urine dipstick: NEGATIVE
Ketones, ur: NEGATIVE
Nitrite: NEGATIVE
Protein, ur: NEGATIVE
Specific Gravity, Urine: 1.007
Urobilinogen, UA: 0.2
pH: 6

## 2010-10-19 LAB — POCT I-STAT, CHEM 8
BUN: 18
Calcium, Ion: 1.19
Chloride: 106
Creatinine, Ser: 1.2
Glucose, Bld: 85
HCT: 41
Hemoglobin: 13.9
Potassium: 3.5
Sodium: 141
TCO2: 30

## 2010-10-19 LAB — OCCULT BLOOD X 1 CARD TO LAB, STOOL: Fecal Occult Bld: NEGATIVE

## 2010-11-11 ENCOUNTER — Other Ambulatory Visit: Payer: Self-pay | Admitting: *Deleted

## 2010-11-11 DIAGNOSIS — I10 Essential (primary) hypertension: Secondary | ICD-10-CM

## 2010-11-11 MED ORDER — ATENOLOL 50 MG PO TABS: 75.0000 mg | ORAL_TABLET | ORAL | Status: DC

## 2010-12-02 ENCOUNTER — Ambulatory Visit (INDEPENDENT_AMBULATORY_CARE_PROVIDER_SITE_OTHER): Payer: Medicare Other | Admitting: Family Medicine

## 2010-12-02 DIAGNOSIS — Z23 Encounter for immunization: Secondary | ICD-10-CM

## 2011-02-11 ENCOUNTER — Other Ambulatory Visit: Payer: Self-pay

## 2011-02-11 DIAGNOSIS — I10 Essential (primary) hypertension: Secondary | ICD-10-CM

## 2011-02-11 MED ORDER — FUROSEMIDE 20 MG PO TABS: 30.0000 mg | ORAL_TABLET | Freq: Every day | ORAL | Status: DC

## 2011-03-04 ENCOUNTER — Other Ambulatory Visit: Payer: Self-pay | Admitting: Family Medicine

## 2011-03-04 DIAGNOSIS — F068 Other specified mental disorders due to known physiological condition: Secondary | ICD-10-CM

## 2011-03-04 MED ORDER — RISPERIDONE 1 MG PO TABS: 1.0000 mg | ORAL_TABLET | Freq: Two times a day (BID) | ORAL | Status: DC

## 2011-03-04 NOTE — Telephone Encounter (Signed)
Pt needs refill on risperidone 1mg  [facility] [phone]

## 2011-03-29 ENCOUNTER — Ambulatory Visit (INDEPENDENT_AMBULATORY_CARE_PROVIDER_SITE_OTHER): Payer: Medicare Other | Admitting: Family Medicine

## 2011-03-29 ENCOUNTER — Encounter: Payer: Self-pay | Admitting: Family Medicine

## 2011-03-29 VITALS — BP 170/110 | Temp 97.6°F | Wt 166.0 lb

## 2011-03-29 DIAGNOSIS — I10 Essential (primary) hypertension: Secondary | ICD-10-CM

## 2011-03-29 MED ORDER — AMLODIPINE BESYLATE 5 MG PO TABS: 5.0000 mg | ORAL_TABLET | Freq: Every day | ORAL | Status: DC

## 2011-03-29 NOTE — Progress Notes (Signed)
  Subjective:    Patient ID: Donna Arias, female    DOB: 02-03-1926, 79 y.o.   MRN: [id number]  HPI Donna Arias is a 78 year old female who comes in today accompanied by her daughter who is her primary caregiver for evaluation of hypertension  Her blood pressures been in the 130/80 range until about a week ago when it started go up. Today it's 170/110 she is asymptomatic  She takes a atenolol dose is 75 mg daily and she took an extra 50 mg tablet last night because her blood pressure was elevated.   Review of Systems General and neurologic review of systems otherwise negative    Objective:   Physical Exam Well-developed well-nourished female in no acute distress BP right arm sitting position 170/110 pulse 60 and regular       Assessment & Plan:  Hypertension not at goal add Norvasc 5 mg daily followup in 10 days

## 2011-03-29 NOTE — Patient Instructions (Signed)
Continue the atenolol a tab on a half daily in the morning and add 5 mg of Norvasc daily  Blood pressure checked daily the morning  Followup on the 13th

## 2011-04-07 ENCOUNTER — Ambulatory Visit (INDEPENDENT_AMBULATORY_CARE_PROVIDER_SITE_OTHER): Payer: Medicare Other | Admitting: Family Medicine

## 2011-04-07 ENCOUNTER — Encounter: Payer: Self-pay | Admitting: Family Medicine

## 2011-04-07 VITALS — BP 152/84 | Temp 97.4°F | Ht 64.5 in | Wt 166.0 lb

## 2011-04-07 DIAGNOSIS — Z0000 Encounter for general adult medical examination without abnormal findings: Secondary | ICD-10-CM

## 2011-04-07 DIAGNOSIS — M549 Dorsalgia, unspecified: Secondary | ICD-10-CM

## 2011-04-07 DIAGNOSIS — E039 Hypothyroidism, unspecified: Secondary | ICD-10-CM

## 2011-04-07 DIAGNOSIS — F068 Other specified mental disorders due to known physiological condition: Secondary | ICD-10-CM

## 2011-04-07 DIAGNOSIS — G459 Transient cerebral ischemic attack, unspecified: Secondary | ICD-10-CM

## 2011-04-07 DIAGNOSIS — K219 Gastro-esophageal reflux disease without esophagitis: Secondary | ICD-10-CM

## 2011-04-07 DIAGNOSIS — I10 Essential (primary) hypertension: Secondary | ICD-10-CM

## 2011-04-07 DIAGNOSIS — J449 Chronic obstructive pulmonary disease, unspecified: Secondary | ICD-10-CM

## 2011-04-07 LAB — POCT URINALYSIS DIPSTICK
Protein, UA: NEGATIVE
Spec Grav, UA: 1.015
Urobilinogen, UA: 0.2
pH, UA: 5.5

## 2011-04-07 LAB — BASIC METABOLIC PANEL
BUN: 22 mg/dL (ref 6–23)
CO2: 25 mEq/L (ref 19–32)
Calcium: 9.8 mg/dL (ref 8.4–10.5)
Chloride: 102 mEq/L (ref 96–112)
Creatinine, Ser: 1.1 mg/dL (ref 0.4–1.2)
GFR: 53.22 mL/min — ABNORMAL LOW (ref >60.00)
Glucose, Bld: 116 mg/dL — ABNORMAL HIGH (ref 70–99)

## 2011-04-07 LAB — CBC WITH DIFFERENTIAL/PLATELET
Basophils Absolute: 0 10*3/uL (ref 0.0–0.1)
Basophils Relative: 0.5 % (ref 0.0–3.0)
Eosinophils Absolute: 0.1 10*3/uL (ref 0.0–0.7)
Hemoglobin: 14.4 g/dL (ref 12.0–15.0)
Lymphocytes Relative: 24.6 % (ref 12.0–46.0)
MCHC: 32.4 g/dL (ref 30.0–36.0)
MCV: 96.6 fl (ref 78.0–100.0)
Monocytes Absolute: 0.4 10*3/uL (ref 0.1–1.0)
Neutro Abs: 4.9 10*3/uL (ref 1.4–7.7)
RBC: 4.61 Mil/uL (ref 3.87–5.11)
RDW: 15.1 % — ABNORMAL HIGH (ref 11.5–14.6)

## 2011-04-07 LAB — TSH: TSH: 3.12 u[IU]/mL (ref 0.35–5.50)

## 2011-04-07 MED ORDER — HYDROCODONE-ACETAMINOPHEN 7.5-750 MG PO TABS: 0.5000 | ORAL_TABLET | Freq: Three times a day (TID) | ORAL | Status: DC | PRN

## 2011-04-07 MED ORDER — POTASSIUM CHLORIDE CRYS ER 20 MEQ PO TBCR: 20.0000 meq | EXTENDED_RELEASE_TABLET | Freq: Every day | ORAL | Status: DC

## 2011-04-07 MED ORDER — ATENOLOL 50 MG PO TABS: 75.0000 mg | ORAL_TABLET | ORAL | Status: DC

## 2011-04-07 MED ORDER — RISPERIDONE 1 MG PO TABS: 1.0000 mg | ORAL_TABLET | Freq: Two times a day (BID) | ORAL | Status: DC

## 2011-04-07 MED ORDER — LEVOTHYROXINE SODIUM 50 MCG PO TABS: 50.0000 ug | ORAL_TABLET | Freq: Every day | ORAL | Status: DC

## 2011-04-07 MED ORDER — FUROSEMIDE 20 MG PO TABS: 30.0000 mg | ORAL_TABLET | Freq: Every day | ORAL | Status: DC

## 2011-04-07 MED ORDER — AMLODIPINE BESYLATE 5 MG PO TABS: 5.0000 mg | ORAL_TABLET | Freq: Every day | ORAL | Status: DC

## 2011-04-07 MED ORDER — CYCLOBENZAPRINE HCL 5 MG PO TABS: 2.5000 mg | ORAL_TABLET | Freq: Three times a day (TID) | ORAL | Status: DC | PRN

## 2011-04-07 NOTE — Patient Instructions (Signed)
Continue your current medications  Followup in 1 year sooner if any problems 

## 2011-04-07 NOTE — Progress Notes (Signed)
  Subjective:    Patient ID: Donna Arias, female    DOB: 09-Nov-1926, 78 y.o.   MRN: [id number]  HPI Huston Foley is a 78 year old married female ex-smoker who comes in today accompanied by her daughter who is her primary caregiver in addition to her her husband. She's here for a today for a Medicare wellness examination because of a history of hypertension, chronic back pain and neck pain, hypothyroidism, and dementia  Her medications were reviewed in detail and there've been no change see medicine list is accurate. She recently underwent bilateral cataract extraction and lens implants because of severe cataracts.  Cognitive function is marginal she lives at home in with her husband who is also marginal however the daughter [name] watches him and checks them twice a day. She does not walk on a daily basis, home health safety reviewed no issues identified, they do have guns in the house, but not locked up, and they do have a health care power of attorney and living will. Vaccinations up to date   Review of Systems  Constitutional: Negative.   HENT: Negative.   Eyes: Negative.   Respiratory: Negative.   Cardiovascular: Negative.   Gastrointestinal: Negative.   Genitourinary: Negative.   Musculoskeletal: Negative.   Neurological: Negative.   Hematological: Negative.   Psychiatric/Behavioral: Negative.        Objective:   Physical Exam  Constitutional: She appears well-developed and well-nourished.  HENT:  Head: Normocephalic and atraumatic.  Right Ear: External ear normal.  Left Ear: External ear normal.  Nose: Nose normal.  Mouth/Throat: Oropharynx is clear and moist.  Eyes: EOM are normal. Pupils are equal, round, and reactive to light.  Neck: Normal range of motion. Neck supple. No thyromegaly present.  Cardiovascular: Normal rate, regular rhythm, normal heart sounds and intact distal pulses.  Exam reveals no gallop and no friction rub.   No murmur heard. Pulmonary/Chest: Effort  normal and breath sounds normal.  Abdominal: Soft. Bowel sounds are normal. She exhibits no distension and no mass. There is no tenderness. There is no rebound.  Genitourinary:       Bilateral breast exam normal fungal infection in the groin  Musculoskeletal: Normal range of motion.  Lymphadenopathy:    She has no cervical adenopathy.  Neurological: She is alert. She has normal reflexes. No cranial nerve deficit. She exhibits normal muscle tone. Coordination normal.  Skin: Skin is warm and dry.  Psychiatric: She has a normal mood and affect. Her behavior is normal. Judgment and thought content normal.          Assessment & Plan:  Hypertension continue current medication  Bilateral cataract extraction and lens implants 2 weeks ago  Chronic neck and back pain Vicodin and Flexeril when necessary  Hypothyroidism continue Synthroid  Dementia continue Risperdal

## 2011-07-19 ENCOUNTER — Encounter: Payer: Self-pay | Admitting: Family Medicine

## 2011-07-19 ENCOUNTER — Ambulatory Visit (INDEPENDENT_AMBULATORY_CARE_PROVIDER_SITE_OTHER): Payer: Medicare Other | Admitting: Family Medicine

## 2011-07-19 VITALS — BP 180/90 | Temp 97.7°F | Wt 169.0 lb

## 2011-07-19 DIAGNOSIS — N760 Acute vaginitis: Secondary | ICD-10-CM

## 2011-07-19 MED ORDER — NYSTATIN-TRIAMCINOLONE 100000-0.1 UNIT/GM-% EX OINT: TOPICAL_OINTMENT | Freq: Two times a day (BID) | CUTANEOUS | Status: DC

## 2011-07-19 NOTE — Progress Notes (Signed)
  Subjective:    Patient ID: Donna Arias, female    DOB: 1926-11-15, 78 y.o.   MRN: [id number]  HPI Donna Arias is a 78 year old female who comes in today accompanied by her daughter for evaluation of vaginitis  She's had trouble with urinary leakage and has to wear a pad 24 7. Because of this she has chronic fungal infection of the labia. We've been using miconazole twice daily and has improved a lot but it still hasn't completely resolved   Review of Systems Review of systems otherwise negative well-developed well-nourished female in acute distress examination the labia shows erythema externally no internal inflammation    Objective:   Physical Exam Examination the labia show erythema no internal inflammation       Assessment & Plan:  Vaginitis secondary to chronic urinary irritation plan continue antifungal cream add steroid gel

## 2011-07-19 NOTE — Patient Instructions (Signed)
Use small amounts of the medicated cream twice daily  Return when necessary

## 2011-10-07 ENCOUNTER — Ambulatory Visit (INDEPENDENT_AMBULATORY_CARE_PROVIDER_SITE_OTHER): Payer: Medicare Other

## 2011-10-07 DIAGNOSIS — Z23 Encounter for immunization: Secondary | ICD-10-CM

## 2011-11-23 ENCOUNTER — Other Ambulatory Visit: Payer: Self-pay | Admitting: *Deleted

## 2011-11-23 DIAGNOSIS — N760 Acute vaginitis: Secondary | ICD-10-CM

## 2011-11-23 MED ORDER — NYSTATIN-TRIAMCINOLONE 100000-0.1 UNIT/GM-% EX OINT: TOPICAL_OINTMENT | Freq: Two times a day (BID) | CUTANEOUS | Status: DC

## 2012-02-21 ENCOUNTER — Other Ambulatory Visit: Payer: Self-pay | Admitting: *Deleted

## 2012-02-21 DIAGNOSIS — I10 Essential (primary) hypertension: Secondary | ICD-10-CM

## 2012-02-21 MED ORDER — FUROSEMIDE 20 MG PO TABS: 30.0000 mg | ORAL_TABLET | Freq: Every day | ORAL | Status: DC

## 2012-03-20 ENCOUNTER — Telehealth: Payer: Self-pay | Admitting: Family Medicine

## 2012-03-20 MED ORDER — TRAMADOL HCL 50 MG PO TABS: 50.0000 mg | ORAL_TABLET | Freq: Every evening | ORAL | Status: DC | PRN

## 2012-03-20 NOTE — Telephone Encounter (Signed)
Dr Tawanna Cooler spoke with daughter - Tramadol sent

## 2012-03-20 NOTE — Telephone Encounter (Signed)
Patient Information:  Caller Name: [name]  Phone: [phone]  Patient: Donna Arias, Donna Arias  Gender: Female  DOB: 07/29/1926  Age: 79 Years  PCP: Kelle Darting [facility]  Office Follow Up:  Does the office need to follow up with this patient?: Yes  Instructions For The Office: See RN note.  RN Note:  No appointments available today. Daughter of patient says that they will see any physician if needed. Patient is not having any pain at this time, but it was a sudden onset and very severe pain yesterday. Please call daughter with further instructions about what time to bring the patient in for an appointment today. Thanks.  Symptoms  Reason For Call & Symptoms: Reports neck pain, both shoulder, back and hips. No injury reported. Reports severe pain at times. BP 165/88 at 4:30pm yesterday.  Reviewed Health History In EMR: Yes  Reviewed Medications In EMR: Yes  Reviewed Allergies In EMR: Yes  Reviewed Surgeries / Procedures: Yes  Date of Onset of Symptoms: 03/19/2012  Guideline(s) Used:  Back Pain  Disposition Per Guideline:   Go to ED Now (or to Office with PCP Approval)  Reason For Disposition Reached:   Sudden onset of severe back pain and age > 98  Advice Given:  Call Back If:  You become worse.

## 2012-03-20 NOTE — Telephone Encounter (Signed)
Fleet Contras, please advise re possible work in.

## 2012-04-11 ENCOUNTER — Other Ambulatory Visit: Payer: Self-pay

## 2012-04-11 MED ORDER — LEVOTHYROXINE SODIUM 50 MCG PO TABS: 50.0000 ug | ORAL_TABLET | Freq: Every day | ORAL | Status: DC

## 2012-04-12 ENCOUNTER — Other Ambulatory Visit: Payer: Self-pay

## 2012-04-12 MED ORDER — POTASSIUM CHLORIDE CRYS ER 20 MEQ PO TBCR: 20.0000 meq | EXTENDED_RELEASE_TABLET | Freq: Every day | ORAL | Status: DC

## 2012-04-14 ENCOUNTER — Ambulatory Visit (INDEPENDENT_AMBULATORY_CARE_PROVIDER_SITE_OTHER): Payer: Medicare Other | Admitting: Family

## 2012-04-14 ENCOUNTER — Encounter: Payer: Self-pay | Admitting: Family

## 2012-04-14 VITALS — BP 150/90 | HR 55 | Ht 64.0 in | Wt 164.5 lb

## 2012-04-14 DIAGNOSIS — E039 Hypothyroidism, unspecified: Secondary | ICD-10-CM

## 2012-04-14 DIAGNOSIS — Z0000 Encounter for general adult medical examination without abnormal findings: Secondary | ICD-10-CM

## 2012-04-14 DIAGNOSIS — I10 Essential (primary) hypertension: Secondary | ICD-10-CM

## 2012-04-14 DIAGNOSIS — Z1231 Encounter for screening mammogram for malignant neoplasm of breast: Secondary | ICD-10-CM

## 2012-04-14 LAB — TSH: TSH: 1.99 u[IU]/mL (ref 0.35–5.50)

## 2012-04-14 LAB — CBC WITH DIFFERENTIAL/PLATELET
Basophils Absolute: 0 10*3/uL (ref 0.0–0.1)
Lymphocytes Relative: 27.8 % (ref 12.0–46.0)
Lymphs Abs: 1.9 10*3/uL (ref 0.7–4.0)
Monocytes Relative: 6.4 % (ref 3.0–12.0)
Neutrophils Relative %: 63.9 % (ref 43.0–77.0)
Platelets: 188 10*3/uL (ref 150.0–400.0)
RDW: 14.8 % — ABNORMAL HIGH (ref 11.5–14.6)

## 2012-04-14 LAB — LIPID PANEL
HDL: 56.8 mg/dL (ref >39.00)
Triglycerides: 159 mg/dL — ABNORMAL HIGH (ref 0.0–149.0)

## 2012-04-14 LAB — HEPATIC FUNCTION PANEL
AST: 18 U/L (ref 0–37)
Albumin: 4.1 g/dL (ref 3.5–5.2)
Total Protein: 7.9 g/dL (ref 6.0–8.3)

## 2012-04-14 LAB — BASIC METABOLIC PANEL
BUN: 23 mg/dL (ref 6–23)
Calcium: 9.4 mg/dL (ref 8.4–10.5)
GFR: 54.9 mL/min — ABNORMAL LOW (ref >60.00)
Glucose, Bld: 124 mg/dL — ABNORMAL HIGH (ref 70–99)

## 2012-04-14 LAB — LDL CHOLESTEROL, DIRECT: Direct LDL: 148.4 mg/dL

## 2012-04-14 MED ORDER — ATENOLOL 50 MG PO TABS: 75.0000 mg | ORAL_TABLET | ORAL | Status: DC

## 2012-04-14 NOTE — Progress Notes (Signed)
Subjective:    Patient ID: Donna Arias, female    DOB: 1926-03-06, 79 y.o.   MRN: [id number]  HPI   This is a routine physical examination for this healthy  Female. Reviewed all health maintenance protocols including mammography colonoscopy bone density and reviewed appropriate screening labs. Her immunization history was reviewed as well as her current medications and allergies refills of her chronic medications were given and the plan for yearly health maintenance was discussed all orders and referrals were made as appropriate.   Review of Systems  Constitutional: Negative.   Eyes: Negative.   Respiratory: Negative.   Cardiovascular: Negative.   Gastrointestinal: Negative.   Endocrine: Negative.   Genitourinary: Negative.   Musculoskeletal: Negative.   Skin: Negative.   Allergic/Immunologic: Negative.   Neurological: Negative.   Hematological: Negative.   Psychiatric/Behavioral: Negative.    Past Medical History  Diagnosis Date  . COPD (chronic obstructive pulmonary disease)   . Dementia   . GERD (gastroesophageal reflux disease)   . Hypertension     History   Social History  . Marital Status: Married    Spouse Name: N/A    Number of Children: N/A  . Years of Education: N/A   Occupational History  . RETIRED    Social History Main Topics  . Smoking status: Former Games developer  . Smokeless tobacco: Not on file  . Alcohol Use: No  . Drug Use: No  . Sexually Active: Not on file   Other Topics Concern  . Not on file   Social History Narrative   CB x 1      Regular exercise - NO    No past surgical history on file.  No family history on file.  Allergies  Allergen Reactions  . Codeine Sulfate     REACTION: unspecified    Current Outpatient Prescriptions on File Prior to Visit  Medication Sig Dispense Refill  . Acetaminophen (TYLENOL 8 HOUR PO) Take by mouth.      [name] amLODipine (NORVASC) 5 MG tablet Take 1 tablet (5 mg total) by mouth daily.  100 tablet   3  . aspirin 81 MG tablet Take 81 mg by mouth daily.      [name] Besifloxacin HCl (BESIVANCE) 0.6 % SUSP Apply to eye.      . bromfenac (XIBROM) 0.09 % ophthalmic solution 1 drop 2 (two) times daily.      . cyclobenzaprine (FLEXERIL) 5 MG tablet Take 0.5 tablets (2.5 mg total) by mouth 3 (three) times daily as needed.  30 tablet  3  . HYDROcodone-acetaminophen (VICODIN ES) 7.5-750 MG per tablet Take 0.5 tablets by mouth 3 (three) times daily as needed.  30 tablet  3  . ketorolac (ACULAR) 0.4 % SOLN 1 drop 4 (four) times daily.      . NON FORMULARY Ear pain drops      . NON FORMULARY cloracidine      . prednisoLONE acetate (PRED MILD) 0.12 % ophthalmic suspension 1 drop 4 (four) times daily.      . risperiDONE (RISPERDAL) 1 MG tablet Take 1 tablet (1 mg total) by mouth 2 (two) times daily.  200 tablet  3  . furosemide (LASIX) 20 MG tablet Take 1.5 tablets (30 mg total) by mouth daily.  100 tablet  3  . levothyroxine (SYNTHROID, LEVOTHROID) 50 MCG tablet Take 1 tablet (50 mcg total) by mouth daily.  100 tablet  3  . nystatin-triamcinolone ointment (MYCOLOG) Apply topically 2 (two) times daily.  60  g  3  . potassium chloride SA (K-DUR,KLOR-CON) 20 MEQ tablet Take 1 tablet (20 mEq total) by mouth daily.  100 tablet  3  . traMADol (ULTRAM) 50 MG tablet Take 1 tablet (50 mg total) by mouth at bedtime as needed for pain.  50 tablet  2   No current facility-administered medications on file prior to visit.    BP 150/90  Pulse 55  Ht 5\' 4"  (1.626 m)  Wt 164 lb 8 oz (74.617 kg)  BMI 28.22 kg/m2  SpO2 96%chart and    Objective:   Physical Exam  Constitutional: She is oriented to person, place, and time. She appears well-developed and well-nourished.  HENT:  Right Ear: External ear normal.  Left Ear: External ear normal.  Nose: Nose normal.  Mouth/Throat: Oropharynx is clear and moist.  Eyes: Conjunctivae and EOM are normal. Pupils are equal, round, and reactive to light.  Neck: Normal range of  motion. Neck supple. No thyromegaly present.  Cardiovascular: Normal rate, regular rhythm and normal heart sounds.   Pulmonary/Chest: Effort normal and breath sounds normal.  Abdominal: Soft. Bowel sounds are normal. She exhibits no distension. There is no tenderness. There is no rebound.  Musculoskeletal: Normal range of motion.  Neurological: She is alert and oriented to person, place, and time. She has normal reflexes. No cranial nerve deficit.  Skin: Skin is warm and dry.  Psychiatric: She has a normal mood and affect.          Assessment & Plan:  Assessment: 1. CPX 2. Hypertension 3. Dementia  Plan: Lab sent to include CBC, lipids, TSH, LFTs and BMP. Will notify patient pending results. Mammogram ordered. Encouraged healthy diet and exercise. Monthly self breast exams. We'll follow up patient pending results of her labs, in 3 months, and as needed.

## 2012-04-17 ENCOUNTER — Encounter: Payer: Medicare Other | Admitting: Family Medicine

## 2012-06-09 ENCOUNTER — Other Ambulatory Visit: Payer: Self-pay | Admitting: *Deleted

## 2012-06-09 DIAGNOSIS — I10 Essential (primary) hypertension: Secondary | ICD-10-CM

## 2012-06-09 MED ORDER — ATENOLOL 50 MG PO TABS: 75.0000 mg | ORAL_TABLET | ORAL | Status: DC

## 2012-06-13 ENCOUNTER — Other Ambulatory Visit: Payer: Self-pay | Admitting: *Deleted

## 2012-06-13 DIAGNOSIS — I10 Essential (primary) hypertension: Secondary | ICD-10-CM

## 2012-06-13 MED ORDER — AMLODIPINE BESYLATE 5 MG PO TABS: 5.0000 mg | ORAL_TABLET | Freq: Every day | ORAL | Status: DC

## 2012-06-22 ENCOUNTER — Other Ambulatory Visit: Payer: Self-pay | Admitting: *Deleted

## 2012-06-22 DIAGNOSIS — F068 Other specified mental disorders due to known physiological condition: Secondary | ICD-10-CM

## 2012-06-22 MED ORDER — RISPERIDONE 1 MG PO TABS: 1.0000 mg | ORAL_TABLET | Freq: Two times a day (BID) | ORAL | Status: DC

## 2012-11-10 ENCOUNTER — Ambulatory Visit (INDEPENDENT_AMBULATORY_CARE_PROVIDER_SITE_OTHER): Payer: Medicare Other

## 2012-11-10 DIAGNOSIS — Z23 Encounter for immunization: Secondary | ICD-10-CM

## 2012-11-27 ENCOUNTER — Other Ambulatory Visit: Payer: Self-pay | Admitting: *Deleted

## 2012-11-27 DIAGNOSIS — I10 Essential (primary) hypertension: Secondary | ICD-10-CM

## 2012-11-27 MED ORDER — FUROSEMIDE 20 MG PO TABS: 30.0000 mg | ORAL_TABLET | Freq: Every day | ORAL | Status: DC

## 2013-03-12 ENCOUNTER — Other Ambulatory Visit: Payer: Self-pay | Admitting: Family Medicine

## 2013-04-14 ENCOUNTER — Other Ambulatory Visit: Payer: Self-pay | Admitting: Family Medicine

## 2013-04-30 ENCOUNTER — Ambulatory Visit (INDEPENDENT_AMBULATORY_CARE_PROVIDER_SITE_OTHER): Payer: Commercial Managed Care - HMO | Admitting: Family Medicine

## 2013-04-30 ENCOUNTER — Encounter: Payer: Self-pay | Admitting: Family Medicine

## 2013-04-30 VITALS — BP 124/80 | Temp 97.9°F | Ht 66.0 in | Wt 164.0 lb

## 2013-04-30 DIAGNOSIS — M549 Dorsalgia, unspecified: Secondary | ICD-10-CM

## 2013-04-30 DIAGNOSIS — K219 Gastro-esophageal reflux disease without esophagitis: Secondary | ICD-10-CM

## 2013-04-30 DIAGNOSIS — Z23 Encounter for immunization: Secondary | ICD-10-CM

## 2013-04-30 DIAGNOSIS — F068 Other specified mental disorders due to known physiological condition: Secondary | ICD-10-CM

## 2013-04-30 DIAGNOSIS — N760 Acute vaginitis: Secondary | ICD-10-CM

## 2013-04-30 DIAGNOSIS — E039 Hypothyroidism, unspecified: Secondary | ICD-10-CM

## 2013-04-30 DIAGNOSIS — M5090 Cervical disc disorder, unspecified, unspecified cervical region: Secondary | ICD-10-CM

## 2013-04-30 DIAGNOSIS — I10 Essential (primary) hypertension: Secondary | ICD-10-CM

## 2013-04-30 DIAGNOSIS — J449 Chronic obstructive pulmonary disease, unspecified: Secondary | ICD-10-CM

## 2013-04-30 LAB — TSH: TSH: 6.38 u[IU]/mL — AB (ref 0.35–5.50)

## 2013-04-30 LAB — CBC WITH DIFFERENTIAL/PLATELET
BASOS ABS: 0 10*3/uL (ref 0.0–0.1)
Basophils Relative: 0.5 % (ref 0.0–3.0)
EOS ABS: 0.1 10*3/uL (ref 0.0–0.7)
Eosinophils Relative: 1.6 % (ref 0.0–5.0)
HCT: 40.6 % (ref 36.0–46.0)
Hemoglobin: 13.5 g/dL (ref 12.0–15.0)
Lymphocytes Relative: 21.6 % (ref 12.0–46.0)
Lymphs Abs: 1.6 10*3/uL (ref 0.7–4.0)
MCHC: 33.3 g/dL (ref 30.0–36.0)
MCV: 93.7 fl (ref 78.0–100.0)
MONOS PCT: 7.2 % (ref 3.0–12.0)
Monocytes Absolute: 0.5 10*3/uL (ref 0.1–1.0)
NEUTROS PCT: 69.1 % (ref 43.0–77.0)
Neutro Abs: 5.2 10*3/uL (ref 1.4–7.7)
PLATELETS: 191 10*3/uL (ref 150.0–400.0)
RBC: 4.33 Mil/uL (ref 3.87–5.11)
RDW: 15.3 % — ABNORMAL HIGH (ref 11.5–14.6)
WBC: 7.5 10*3/uL (ref 4.5–10.5)

## 2013-04-30 LAB — BASIC METABOLIC PANEL
BUN: 27 mg/dL — AB (ref 6–23)
CO2: 31 mEq/L (ref 19–32)
Calcium: 9.5 mg/dL (ref 8.4–10.5)
Chloride: 104 mEq/L (ref 96–112)
Creatinine, Ser: 1.2 mg/dL (ref 0.4–1.2)
GFR: 44.96 mL/min — ABNORMAL LOW (ref >60.00)
Glucose, Bld: 102 mg/dL — ABNORMAL HIGH (ref 70–99)
Potassium: 4.1 mEq/L (ref 3.5–5.1)
Sodium: 141 mEq/L (ref 135–145)

## 2013-04-30 MED ORDER — TRAMADOL HCL 50 MG PO TABS: 50.0000 mg | ORAL_TABLET | Freq: Every evening | ORAL | Status: DC | PRN

## 2013-04-30 MED ORDER — ATENOLOL 50 MG PO TABS: 75.0000 mg | ORAL_TABLET | ORAL | Status: DC

## 2013-04-30 MED ORDER — FUROSEMIDE 20 MG PO TABS: 30.0000 mg | ORAL_TABLET | Freq: Every day | ORAL | Status: DC

## 2013-04-30 MED ORDER — LEVOTHYROXINE SODIUM 50 MCG PO TABS: 50.0000 ug | ORAL_TABLET | Freq: Every day | ORAL | Status: DC

## 2013-04-30 MED ORDER — AMLODIPINE BESYLATE 5 MG PO TABS: 5.0000 mg | ORAL_TABLET | Freq: Every day | ORAL | Status: DC

## 2013-04-30 MED ORDER — POTASSIUM CHLORIDE CRYS ER 20 MEQ PO TBCR: EXTENDED_RELEASE_TABLET | ORAL | Status: DC

## 2013-04-30 MED ORDER — RISPERIDONE 1 MG PO TABS: 1.0000 mg | ORAL_TABLET | Freq: Two times a day (BID) | ORAL | Status: DC

## 2013-04-30 MED ORDER — NYSTATIN-TRIAMCINOLONE 100000-0.1 UNIT/GM-% EX OINT: TOPICAL_OINTMENT | CUTANEOUS | Status: DC

## 2013-04-30 MED ORDER — CYCLOBENZAPRINE HCL 5 MG PO TABS: 2.5000 mg | ORAL_TABLET | Freq: Three times a day (TID) | ORAL | Status: DC | PRN

## 2013-04-30 NOTE — Progress Notes (Signed)
Pre visit review using our clinic review tool, if applicable. No additional management support is needed unless otherwise documented below in the visit note. 

## 2013-04-30 NOTE — Patient Instructions (Signed)
Continue her current medications  Followup in 1 year sooner if any problems  Use small amounts of the hormonal cream nightly for 4-6 weeks

## 2013-04-30 NOTE — Progress Notes (Signed)
   Subjective:    Patient ID: Donna Arias, female    DOB: August 12, 1926, 80 y.o.   MRN: [id number]  HPI [name] is a 80 year old married female who comes in  Today accompanied by [name]........ who now is her primary caregiver. The sharp and her husband both live with [facility].... for a Medicare wellness examination  Her med list reviewed in detail there've been no changes.  He takes a Vicodin one half tab at bedtime we'll transition to oral TRAM.  The biggest problem is her recurrent vaginitis despite using triamcinolone and a fungal for antifungal cream twice daily. She wears a. Diaper  She gets routine eye care, dental care, BSE monthly, mammogram and colonoscopy no longer required, vaccinations updated by Apolonio Schneiders. Cognitive function stable.......... she has underlying dementia , she does exercise on a regular basis she is homebound, home health home health safety reviewed no issues identified, no guns in the house, she does have a health care power of attorney and living will    Review of Systems  Constitutional: Negative.   HENT: Negative.   Eyes: Negative.   Respiratory: Negative.   Cardiovascular: Negative.   Gastrointestinal: Negative.   Genitourinary: Negative.   Musculoskeletal: Negative.   Neurological: Negative.   Psychiatric/Behavioral: Negative.        Objective:   Physical Exam  Nursing note and vitals reviewed. Constitutional: She appears well-developed and well-nourished.  HENT:  Head: Normocephalic and atraumatic.  Right Ear: External ear normal.  Left Ear: External ear normal.  Nose: Nose normal.  Mouth/Throat: Oropharynx is clear and moist.  Eyes: EOM are normal. Pupils are equal, round, and reactive to light.  Neck: Normal range of motion. Neck supple. No thyromegaly present.  Cardiovascular: Normal rate, regular rhythm, normal heart sounds and intact distal pulses.  Exam reveals no gallop and no friction rub.   No murmur  heard. Pulmonary/Chest: Effort normal and breath sounds normal.  Abdominal: Soft. Bowel sounds are normal. She exhibits no distension and no mass. There is no tenderness. There is no rebound.  Genitourinary: Vagina normal.  Erythema of the labia. Bilateral breast exam normal  Musculoskeletal: Normal range of motion.  Lymphadenopathy:    She has no cervical adenopathy.  Neurological: She is alert. She has normal reflexes. No cranial nerve deficit. She exhibits normal muscle tone. Coordination normal.  Skin: Skin is warm and dry.  Psychiatric: She has a normal mood and affect. Her behavior is normal. Judgment and thought content normal.          Assessment & Hypertension at goal continue current therapy  Hypothyroidism continue current therapy  Chronic pain transition fromPlan: healthy female  Healthy female  Hypertension at goal continue current therapy  Hypothyroidism continue current therapy  Persistent vaginitis had hormonal cream  Dementia continue Risperdal

## 2013-05-01 ENCOUNTER — Telehealth: Payer: Self-pay | Admitting: Family Medicine

## 2013-05-01 NOTE — Telephone Encounter (Signed)
Relevant patient education mailed to patient.  

## 2013-09-18 ENCOUNTER — Ambulatory Visit (INDEPENDENT_AMBULATORY_CARE_PROVIDER_SITE_OTHER): Payer: Commercial Managed Care - HMO | Admitting: *Deleted

## 2013-09-18 DIAGNOSIS — Z23 Encounter for immunization: Secondary | ICD-10-CM

## 2013-11-14 ENCOUNTER — Ambulatory Visit (INDEPENDENT_AMBULATORY_CARE_PROVIDER_SITE_OTHER): Payer: Commercial Managed Care - HMO | Admitting: Family Medicine

## 2013-11-14 ENCOUNTER — Telehealth: Payer: Self-pay | Admitting: Family Medicine

## 2013-11-14 ENCOUNTER — Encounter: Payer: Self-pay | Admitting: Family Medicine

## 2013-11-14 VITALS — BP 150/100

## 2013-11-14 DIAGNOSIS — I10 Essential (primary) hypertension: Secondary | ICD-10-CM

## 2013-11-14 DIAGNOSIS — F0391 Unspecified dementia with behavioral disturbance: Secondary | ICD-10-CM

## 2013-11-14 DIAGNOSIS — F03918 Unspecified dementia, unspecified severity, with other behavioral disturbance: Secondary | ICD-10-CM

## 2013-11-14 MED ORDER — FLUOXETINE HCL 10 MG PO CAPS: 10.0000 mg | ORAL_CAPSULE | Freq: Every day | ORAL | Status: DC

## 2013-11-14 MED ORDER — AMLODIPINE BESYLATE 5 MG PO TABS: 5.0000 mg | ORAL_TABLET | Freq: Every day | ORAL | Status: DC

## 2013-11-14 NOTE — Progress Notes (Signed)
Pre visit review using our clinic review tool, if applicable. No additional management support is needed unless otherwise documented below in the visit note. 

## 2013-11-14 NOTE — Progress Notes (Signed)
   Subjective:    Patient ID: Donna Arias, female    DOB: [birth year], 80 y.o.   MRN: [id number]  HPI Donna Arias is a 80 year old female who comes in today accompanied by her daughter for evaluation depression anxiety and hypertension  Her husband [name] died yesterday at hospice. This morning Donna Arias miles blood pressure was 202/107. She gave her next dose of atenolol. She was supposed to be taking Norvasc 5 mg daily but she doesn't have that prescription. She takes Lasix 20 mg daily.  She has some mild dementia and is on respired all 1 mg twice a day [name] gave her one of her Klonopin sedated because of severe anxiety. She wants to know if a mild antidepressant might be helpful for her mother.  I encouraged both of them to enroll in hospice aftercare grief counseling program   Review of Systems Review of systems otherwise negative    Objective:   Physical Exam  Well-developed well-nourished female no acute distress BP today 150/100...Marland KitchenMarland KitchenMarland Kitchen pulse 70 and regular.      Assessment & Plan:Hypertension............... hypertension hypertension  Hypertension.......[name] elevated because of the death of her husband yesterday........ continue the 100 mg of atenolol daily along with the Lasix. Add the Norvasc 5 mg daily  Depression secondary to the death of her husband....... add Prozac 10 mg daily bedtime....... hospice grief counseling program.

## 2013-11-14 NOTE — Patient Instructions (Signed)
Norvasc 5 mg........[name] 1 daily in the morning  Atenolol 50 mg......... one half tablets daily in the morning  Lasix 20 mg..... one in the morning  BP checked daily  Followup in 2 weeks  Prozac 10 mg at bedtime  Call and enroll in the hospice grief counseling program

## 2013-11-14 NOTE — Telephone Encounter (Signed)
Patient Information:  Caller Name: [name]  Phone: [phone]  Patient: Donna Arias, Donna Arias  Gender: Female  DOB: 1926-07-07  Age: 80 Years  PCP: Stevie Kern [facility])  Office Follow Up:  Does the office need to follow up with this patient?: No  Instructions For The Office: N/A   Symptoms  Reason For Call & Symptoms: Hypertension r/t news of  husband's death this morning @ 0800 November 27, 2013. BP = 202/107 after she was told husband had passed overnight. They had been married for 60 years. She was visibly upset. Daughter, who cares for her, gave her Atenolol 50 mgs 2 tabs( instructed to given extra 1/2 tab if needed) at 0800 and Clonipin 0.5 mgs - took 1 at 08:10. BP now = 205/106 @ 0830. Needing Rx to help her with anxiety and coping with adjustment and grief.   Reviewed Health History In EMR: Yes  Reviewed Medications In EMR: Yes  Reviewed Allergies In EMR: Yes  Reviewed Surgeries / Procedures: No  Date of Onset of Symptoms: 11/27/13  Treatments Tried: Clonipin 0.5 mgs - took 1 at 08:10  Treatments Tried Worked: No  Guideline(s) Used:  High Blood Pressure  Disposition Per Guideline:   See Today in Office  Reason For Disposition Reached:   BP > 180/110  Advice Given:  General:  Untreated high blood pressure may cause damage to the heart, brain, kidneys, and eyes.  Treatment of high blood pressure can reduce the risk of stroke, heart attack, and heart failure.  The goal of blood pressure treatment for most patients with hypertension is to keep the blood pressure under 140/90.  Call Back If:  Headache, blurred vision, difficulty talking, or difficulty walking occurs  Chest pain or difficulty breathing occurs  You want to go in to the office for a blood pressure check  You become worse.  Patient Will Follow Care Advice:  YES  Appointment Scheduled:  Nov 27, 2013 09:15:00 Appointment Scheduled Provider:  Stevie Kern [facility])

## 2013-11-14 NOTE — Telephone Encounter (Signed)
Pt request refill amLODipine (NORVASC) 5 MG tablet [facility] [address]

## 2013-11-15 ENCOUNTER — Telehealth: Payer: Self-pay | Admitting: Family Medicine

## 2013-11-15 NOTE — Telephone Encounter (Signed)
emmi mailed  °

## 2013-11-28 ENCOUNTER — Ambulatory Visit (INDEPENDENT_AMBULATORY_CARE_PROVIDER_SITE_OTHER): Payer: Commercial Managed Care - HMO | Admitting: Family Medicine

## 2013-11-28 ENCOUNTER — Encounter: Payer: Self-pay | Admitting: Family Medicine

## 2013-11-28 VITALS — BP 110/68 | Temp 98.0°F | Wt 162.0 lb

## 2013-11-28 DIAGNOSIS — I10 Essential (primary) hypertension: Secondary | ICD-10-CM

## 2013-11-28 DIAGNOSIS — M5090 Cervical disc disorder, unspecified, unspecified cervical region: Secondary | ICD-10-CM

## 2013-11-28 NOTE — Progress Notes (Signed)
   Subjective:    Patient ID: Donna Arias, female    DOB: 1926-07-19, 80 y.o.   MRN: [id number]  HPI Donna Arias is a 80 year old recently widowed female,,,,,,,, husband [name] died with hospice 2 weeks ago,,,,,,, who comes in today accompanied by [name] her daughter who is her primary caregiver .  Donna Arias lives with [facility] full-time  She's currently taking Tenormin 75 mg daily, Lasix 30 mg daily and Norvasc 5 mg daily for hypertension. [name] brings in a list of her blood pressure reading some of which are in the 100/70 range. BP today 110/68. She says she is not lightheaded when she stands up however I'm concerned about her blood pressure being this low. We will make some adjustments in her medication  She has a history of cervical disc disease has pain in her neck that she is down into her shoulders. She taken tramadol when necessary. We discussed for his options including massage therapy physical therapy and a soft cervical collar.  Her dementia is stable.   Review of Systems    review of systems otherwise negative Objective:   Physical Exam  Well-developed well-nourished female no acute distress vital signs stable she's afebrile BP right arm sitting position 110/68      Assessment & Plan:  Hypertension....... BP too low.......[name] Decrease Tenormin to 50 mg daily and decrease Lasix to 20 mg daily, continue Norvasc 5 mg daily, monitor BP....... BP: 140/80,,,,,, return when necessary  Cervical disc disease,,,,,,,,, conservative therapy with soft cervical collar, Flexeril 5 mg daily at bedtime, tramadol when necessary for pain, question PT and/or massage therapy  Allergic rhinitis,,,,,,,,, OTC Zyrtec plain daily at bedtime

## 2013-11-28 NOTE — Progress Notes (Signed)
Pre visit review using our clinic review tool, if applicable. No additional management support is needed unless otherwise documented below in the visit note. 

## 2013-11-28 NOTE — Patient Instructions (Signed)
Norvasc 5 mg.......[name] 1 daily in the morning  Tenormin 50 mg,,,,,,, 1 daily in the morning  Lasix 20 mg,,,,, 1 daily in the morning  Blood pressure check daily about 5 PM,,,,,,,,,,, blood pressure goal 140/90 range  Plain Claritin and Zyrtec or Allegra,,,,,,,,,,,, 1 at bedtime for allergy symptoms  For her neck I would recommend Flexeril 5 mg at bedtime, soft cervical collar, massage or physical therapy

## 2014-02-25 ENCOUNTER — Ambulatory Visit (INDEPENDENT_AMBULATORY_CARE_PROVIDER_SITE_OTHER): Payer: Commercial Managed Care - HMO | Admitting: Family Medicine

## 2014-02-25 ENCOUNTER — Encounter: Payer: Self-pay | Admitting: Family Medicine

## 2014-02-25 VITALS — BP 164/82 | HR 53 | Temp 97.2°F | Wt 155.6 lb

## 2014-02-25 DIAGNOSIS — G458 Other transient cerebral ischemic attacks and related syndromes: Secondary | ICD-10-CM | POA: Diagnosis not present

## 2014-02-25 DIAGNOSIS — I10 Essential (primary) hypertension: Secondary | ICD-10-CM

## 2014-02-25 NOTE — Patient Instructions (Signed)
Stop the atenolol completely  Norvasc 5 mg,,,,,,,,,, one half tab daily in the morning  Lasix 20 mg,,,,,,,,,,, 1 tab daily in the morning  Check your blood pressure daily in the morning,,,,,,,,, BP goal 140/90,,,,,,,, if in 2 weeks her blood pressure still low then stop the Norvasc completely  Return in one month for follow-up as outlined  Plain Claritin in the morning for allergy symptoms  Tramadol 50 mg,,,,,,,,, 1/2-1 tablet twice daily when necessary for back pain

## 2014-02-25 NOTE — Progress Notes (Signed)
Pre visit review using our clinic review tool, if applicable. No additional management support is needed unless otherwise documented below in the visit note. 

## 2014-02-25 NOTE — Progress Notes (Signed)
   Subjective:    Patient ID: Donna Arias, female    DOB: Aug 29, 1926, 81 y.o.   MRN: [id number]  HPI Donna Arias is a 81 year old widowed female who comes in today accompanied by her daughter Donna Arias who is her primary caregiver with some issues  She has a history of underlying hypertension but recently her blood pressures dropped too low. Saturday she had a TIA last about 60 seconds. She was in the sitting position 100 did occur. Donna Arias appropriately has decreased her blood pressure medicine. She cut her Norvasc from 5 mg to 2.5 and her atenolol from 50 mg to 25 mg daily and decreased her Lasix from 30 mg daily to 20 mg however BP is still too low. Home blood pressures in the morning around [id number] systolic 86-38 diastolic. Like to see her blood pressure in the 140/90 range because of her history of cerebrovascular disease.  She also has allergic rhinitis however married started some plain Claritin which seems to help. She does have recurrent chronic low back pain from degenerative disc disease. She has tramadol at home to take when necessary   Review of Systems Review of systems otherwise negative    Objective:   Physical Exam  Well-developed well-nourished thin female no acute distress BP here today 164/82 however when she goes home her blood pressures drops. So let's go by her home blood pressure readings Donna Arias a great job with that  Neurologic exam unchanged no focal deficit      Assessment & Plan:  Hypertension,,,,,,, BP too low. Stop the beta blocker, continue Norvasc 2.5 mg daily and Lasix 20 mg daily.......[name] monitor BP,,,,,,,,, BP goal 140/90,,,,,,,, if not at goal in 2 weeks stop the Norvasc completely  Allergic rhinitis,,,,,, plain Claritin 10 mg in the morning  Recurrent chronic low back pain,,,,,,,, tramadol twice a day when necessary

## 2014-03-25 ENCOUNTER — Encounter: Payer: Self-pay | Admitting: Family Medicine

## 2014-03-25 ENCOUNTER — Ambulatory Visit (INDEPENDENT_AMBULATORY_CARE_PROVIDER_SITE_OTHER): Payer: Commercial Managed Care - HMO | Admitting: Family Medicine

## 2014-03-25 VITALS — BP 130/90 | HR 63 | Temp 98.2°F | Wt 162.0 lb

## 2014-03-25 DIAGNOSIS — I10 Essential (primary) hypertension: Secondary | ICD-10-CM | POA: Diagnosis not present

## 2014-03-25 NOTE — Patient Instructions (Signed)
Omron pump up digital blood pressure cuff...Marland KitchenMarland KitchenMarland Kitchen [employer]  BP check weekly  Follow-up in May

## 2014-03-25 NOTE — Progress Notes (Signed)
   Subjective:    Patient ID: Donna Arias, female    DOB: 09/19/1926, 81 y.o.   MRN: [id number]  HPI bradycardia is a 81 year old widowed female who comes in today copy by [name] who is her primary caregiver.  On her last visit her blood pressure was too low. We've gradually been decreasing her medications. She's now lost the Norvasc and the Tenormin. BP today 130/90. There additional machine read 162/9 6.......... asked her to get a new machine    Review of Systems Review of systems otherwise negative except she aspirated last week no fever    Objective:   Physical Exam  Well-developed well-nourished female no acute distress vital signs stable she's afebrile BP right arm sitting position 130/90 lungs are clear years normal except for dry ear canals      Assessment & Plan:  Normal pressure............ continue not to give the Norvasc or the beta blocker........ BP check weekly follow-up in one month

## 2014-04-01 ENCOUNTER — Telehealth: Payer: Self-pay | Admitting: Family Medicine

## 2014-04-01 DIAGNOSIS — I10 Essential (primary) hypertension: Secondary | ICD-10-CM

## 2014-04-01 NOTE — Telephone Encounter (Signed)
Pt request refill of the following: potassium chloride SA (K-DUR,KLOR-CON) 20 MEQ tablet, furosemide (LASIX) 20 MG tablet   Phamacy: [facility]

## 2014-04-02 MED ORDER — FUROSEMIDE 20 MG PO TABS: 30.0000 mg | ORAL_TABLET | Freq: Every day | ORAL | Status: DC

## 2014-04-02 MED ORDER — POTASSIUM CHLORIDE CRYS ER 20 MEQ PO TBCR: EXTENDED_RELEASE_TABLET | ORAL | Status: DC

## 2014-04-02 NOTE — Telephone Encounter (Signed)
rx sent

## 2014-06-06 ENCOUNTER — Other Ambulatory Visit: Payer: Self-pay | Admitting: Family Medicine

## 2014-06-13 ENCOUNTER — Encounter: Payer: Self-pay | Admitting: Family Medicine

## 2014-06-13 ENCOUNTER — Ambulatory Visit (INDEPENDENT_AMBULATORY_CARE_PROVIDER_SITE_OTHER): Payer: Commercial Managed Care - HMO | Admitting: Family Medicine

## 2014-06-13 VITALS — BP 130/80 | Temp 98.4°F | Wt 162.0 lb

## 2014-06-13 DIAGNOSIS — M5489 Other dorsalgia: Secondary | ICD-10-CM | POA: Diagnosis not present

## 2014-06-13 DIAGNOSIS — I10 Essential (primary) hypertension: Secondary | ICD-10-CM | POA: Diagnosis not present

## 2014-06-13 DIAGNOSIS — M5090 Cervical disc disorder, unspecified, unspecified cervical region: Secondary | ICD-10-CM

## 2014-06-13 MED ORDER — CYCLOBENZAPRINE HCL 5 MG PO TABS: ORAL_TABLET | ORAL | Status: DC

## 2014-06-13 MED ORDER — TRAMADOL HCL 50 MG PO TABS: ORAL_TABLET | ORAL | Status: DC

## 2014-06-13 MED ORDER — NYSTATIN-TRIAMCINOLONE 100000-0.1 UNIT/GM-% EX OINT
TOPICAL_OINTMENT | CUTANEOUS | Status: DC
Start: 2014-06-13 — End: 2015-08-01

## 2014-06-13 NOTE — Patient Instructions (Signed)
Flexeril 5 mg....... one half tab at bedtime when necessary  Tramadol 50 mg.....[name] one half tab at bedtime when necessary  Continue your other medications  Follow-up in April 2017 for your annual physical exam with [facility]....Marland KitchenMarland Kitchen he is on new nurse practitioner from [employer] extension is [phone]

## 2014-06-13 NOTE — Progress Notes (Signed)
   Subjective:    Patient ID: Donna Arias, female    DOB: May 20, 1926, 81 y.o.   MRN: [id number]  HPI Bradycardia is a 81 year old female.......[name] birthday today.... Who comes in today for follow-up of hypertension  On her last visit we stopped her Norvasc because her blood pressure was too low. Her blood pressures now 130/80.  Advised only to take the Flexeril and tramadol bedtime. When necessary for neck pain   Review of Systems    review of systems otherwise negative Objective:   Physical Exam  Well-developed well-nourished female no acute distress vital signs stable she's afebrile BP 130/80      Assessment & Plan:  Hypertension ago continue current therapy follow-up when necessary

## 2014-06-13 NOTE — Progress Notes (Signed)
Pre visit review using our clinic review tool, if applicable. No additional management support is needed unless otherwise documented below in the visit note. 

## 2014-07-30 ENCOUNTER — Other Ambulatory Visit: Payer: Self-pay | Admitting: Family Medicine

## 2014-10-01 ENCOUNTER — Telehealth: Payer: Self-pay | Admitting: Family Medicine

## 2014-10-01 NOTE — Telephone Encounter (Signed)
error 

## 2014-10-08 ENCOUNTER — Ambulatory Visit: Payer: Medicare HMO | Admitting: *Deleted

## 2014-10-15 ENCOUNTER — Ambulatory Visit (INDEPENDENT_AMBULATORY_CARE_PROVIDER_SITE_OTHER): Payer: Medicare HMO | Admitting: *Deleted

## 2014-10-15 DIAGNOSIS — Z23 Encounter for immunization: Secondary | ICD-10-CM | POA: Diagnosis not present

## 2014-11-08 ENCOUNTER — Other Ambulatory Visit: Payer: Self-pay | Admitting: Family Medicine

## 2014-11-16 ENCOUNTER — Other Ambulatory Visit: Payer: Self-pay | Admitting: Family Medicine

## 2015-01-06 ENCOUNTER — Other Ambulatory Visit: Payer: Self-pay | Admitting: Family Medicine

## 2015-03-27 ENCOUNTER — Encounter: Payer: Self-pay | Admitting: Family Medicine

## 2015-03-27 ENCOUNTER — Ambulatory Visit (INDEPENDENT_AMBULATORY_CARE_PROVIDER_SITE_OTHER): Payer: Commercial Managed Care - HMO | Admitting: Family Medicine

## 2015-03-27 VITALS — BP 138/82 | HR 67 | Temp 97.7°F | Ht 66.0 in | Wt 166.9 lb

## 2015-03-27 DIAGNOSIS — J988 Other specified respiratory disorders: Secondary | ICD-10-CM

## 2015-03-27 DIAGNOSIS — J441 Chronic obstructive pulmonary disease with (acute) exacerbation: Secondary | ICD-10-CM | POA: Diagnosis not present

## 2015-03-27 MED ORDER — BENZONATATE 100 MG PO CAPS: 100.0000 mg | ORAL_CAPSULE | Freq: Three times a day (TID) | ORAL | Status: DC | PRN

## 2015-03-27 MED ORDER — DOXYCYCLINE HYCLATE 100 MG PO CAPS: 100.0000 mg | ORAL_CAPSULE | Freq: Two times a day (BID) | ORAL | Status: DC

## 2015-03-27 MED ORDER — PREDNISONE 10 MG PO TABS: 30.0000 mg | ORAL_TABLET | Freq: Every day | ORAL | Status: DC

## 2015-03-27 NOTE — Patient Instructions (Signed)
Before you leave: Schedule follow-up in 2-3 weeks  Please take the prednisone and the antibiotic, doxycycline, as instructed.  Use the cough medication as needed per instructions.  Please seek care immediately if symptoms are worsening or you have new concerns and follow up sooner if symptoms are not improving on treatment.

## 2015-03-27 NOTE — Progress Notes (Signed)
HPI:  Donna Arias is a very pleasant 82 year old here for an acute visit with her daughter for cough and congestion. Her daughter reports she has had a bad cough productive of white and yellow sputum, low-grade fever up to 100.5 last night and some wheezing for about 3 days. No body aches, lethargy, vomiting, nausea, flu exposure, shortness of breath or decreased appetite. Daughter reports a remote history of smoking, with some underlying COPD and the need for prednisone and antibiotics when she gets sick. She does not use any inhalers on a regular basis.  ROS: See pertinent positives and negatives per HPI.  Past Medical History  Diagnosis Date  . COPD (chronic obstructive pulmonary disease) ([place])   . Dementia   . GERD (gastroesophageal reflux disease)   . Hypertension     No past surgical history on file.  No family history on file.  Social History   Social History  . Marital Status: Married    Spouse Name: N/A  . Number of Children: N/A  . Years of Education: N/A   Occupational History  . RETIRED    Social History Main Topics  . Smoking status: Former Research scientist (life sciences)  . Smokeless tobacco: None  . Alcohol Use: No  . Drug Use: No  . Sexual Activity: Not Asked   Other Topics Concern  . None   Social History Narrative   CB x 1      Regular exercise - NO     Current outpatient prescriptions:  .  Acetaminophen (TYLENOL 8 HOUR PO), Take by mouth., Disp: , Rfl:  .  amLODipine (NORVASC) 5 MG tablet, TAKE 1 TABLET BY MOUTH DAILY, Disp: 100 tablet, Rfl: 3 .  aspirin 81 MG tablet, Take 81 mg by mouth daily., Disp: , Rfl:  .  Besifloxacin HCl (BESIVANCE) 0.6 % SUSP, Apply to eye., Disp: , Rfl:  .  bromfenac (XIBROM) 0.09 % ophthalmic solution, 1 drop 2 (two) times daily., Disp: , Rfl:  .  cyclobenzaprine (FLEXERIL) 5 MG tablet, One half tab daily at bedtime when necessary, Disp: 50 tablet, Rfl: 3 .  FLUoxetine (PROZAC) 10 MG capsule, TAKE 1 CAPSULE BY MOUTH DAILY, Disp: 100 capsule,  Rfl: 3 .  furosemide (LASIX) 20 MG tablet, Take 1.5 tablets (30 mg total) by mouth daily., Disp: 100 tablet, Rfl: 3 .  ketorolac (ACULAR) 0.4 % SOLN, 1 drop 4 (four) times daily., Disp: , Rfl:  .  levothyroxine (SYNTHROID, LEVOTHROID) 50 MCG tablet, TAKE 1 TABLET BY MOUTH DAILY, Disp: 100 tablet, Rfl: 3 .  NON FORMULARY, Ear pain drops, Disp: , Rfl:  .  NON FORMULARY, cloracidine, Disp: , Rfl:  .  nystatin-triamcinolone ointment (MYCOLOG), APPLY TOPICALLY TWICE DAILY, Disp: 60 g, Rfl: 3 .  potassium chloride SA (K-DUR,KLOR-CON) 20 MEQ tablet, TAKE 1 TABLET BY MOUTH ONCE DAILY, Disp: 90 tablet, Rfl: 3 .  prednisoLONE acetate (PRED MILD) 0.12 % ophthalmic suspension, 1 drop 4 (four) times daily., Disp: , Rfl:  .  risperiDONE (RISPERDAL) 1 MG tablet, TAKE 1 TABLET BY MOUTH TWICE DAILY, Disp: 200 tablet, Rfl: 3 .  traMADol (ULTRAM) 50 MG tablet, One half tab daily at bedtime when necessary, Disp: 50 tablet, Rfl: 2 .  benzonatate (TESSALON PERLES) 100 MG capsule, Take 1 capsule (100 mg total) by mouth 3 (three) times daily as needed for cough., Disp: 20 capsule, Rfl: 0 .  doxycycline (VIBRAMYCIN) 100 MG capsule, Take 1 capsule (100 mg total) by mouth 2 (two) times daily., Disp: 20 capsule, Rfl:  0 .  predniSONE (DELTASONE) 10 MG tablet, Take 3 tablets (30 mg total) by mouth daily with breakfast., Disp: 12 tablet, Rfl: 0  EXAM:  Filed Vitals:   03/27/15 1433  BP: 138/82  Pulse: 67  Temp: 97.7 F (36.5 C)    Body mass index is 26.95 kg/(m^2).  GENERAL: vitals reviewed and listed above, alert, oriented, appears well hydrated and in no acute distress  HEENT: atraumatic, conjunttiva clear, no obvious abnormalities on inspection of external nose and ears, normal appearance of ear canals and TMs, clear nasal congestion, mild post oropharyngeal erythema with PND, no tonsillar edema or exudate, no sinus TTP  NECK: no obvious masses on inspection  LUNGS: Scattered expiratory wheezing, rhonchorous  breath sounds RLL  CV: HRRR, no peripheral edema  MS: moves all extremities without noticeable abnormality  PSYCH: pleasant and cooperative, no obvious depression or anxiety  ASSESSMENT AND PLAN:  Discussed the following assessment and plan:  Respiratory infection  COPD exacerbation (HCC)  Suspect acute COPD exacerbation triggered by a respiratory infection and possible mild CAP. She appears well, is breathing comfortably and her oxygen sats are normal on room air. Opted to treat with prednisone and an antibiotic after discussion of risks given her exam findings and the report of a fever and thick sputum. Discussed obtaining chest x-ray and flu testing, however daughter declined. Out of likely helpful treatment window for Tamiflu if this were the flu. Advised follow-up in 2 weeks for prompt evaluation sooner if worsening or not improving as expected.  -of course, we advised to return or notify a doctor immediately if symptoms worsen or persist or new concerns arise.    Patient Instructions  Before you leave: Schedule follow-up in 2-3 weeks  Please take the prednisone and the antibiotic, doxycycline, as instructed.  Use the cough medication as needed per instructions.  Please seek care immediately if symptoms are worsening or you have new concerns and follow up sooner if symptoms are not improving on treatment.     Colin Benton R.

## 2015-03-27 NOTE — Progress Notes (Signed)
Pre visit review using our clinic review tool, if applicable. No additional management support is needed unless otherwise documented below in the visit note. 

## 2015-04-10 ENCOUNTER — Ambulatory Visit (INDEPENDENT_AMBULATORY_CARE_PROVIDER_SITE_OTHER): Payer: Commercial Managed Care - HMO | Admitting: Family Medicine

## 2015-04-10 ENCOUNTER — Encounter: Payer: Self-pay | Admitting: Family Medicine

## 2015-04-10 VITALS — BP 124/80 | HR 75 | Temp 97.7°F | Ht 66.0 in | Wt 164.6 lb

## 2015-04-10 DIAGNOSIS — J449 Chronic obstructive pulmonary disease, unspecified: Secondary | ICD-10-CM

## 2015-04-10 DIAGNOSIS — J309 Allergic rhinitis, unspecified: Secondary | ICD-10-CM

## 2015-04-10 MED ORDER — FLUTICASONE PROPIONATE 50 MCG/ACT NA SUSP: 2.0000 | Freq: Every day | NASAL | Status: DC

## 2015-04-10 MED ORDER — ALBUTEROL SULFATE HFA 108 (90 BASE) MCG/ACT IN AERS: 2.0000 | INHALATION_SPRAY | Freq: Four times a day (QID) | RESPIRATORY_TRACT | Status: DC | PRN

## 2015-04-10 NOTE — Progress Notes (Signed)
Pre visit review using our clinic review tool, if applicable. No additional management support is needed unless otherwise documented below in the visit note. 

## 2015-04-10 NOTE — Patient Instructions (Signed)
I am so glad that you are feeling better.   Please use the Flonase, 2 sprays each nostril daily for 1 month for allergy symptoms, nasal dripping and drainage in the throat.  Can try the albuterol for mild wheezing or excessive cough.   Follow-up with your primary doctor if any persistent or worsening symptoms and for your regular follow-up appointments.

## 2015-04-10 NOTE — Progress Notes (Signed)
HPI:  Donna Arias  here for follow-up regarding her recent respiratory illness. She reports she is doing remarkably well. She has had significant improvement in her energy, breathing and cough. Occasionally has postnasal drip, clear nasal congestion and cough. Occasional wheezing sensation with this, but no shortness of breath, fatigue, fevers, colored sputum or malaise.  ROS: See pertinent positives and negatives per HPI.  Past Medical History  Diagnosis Date  . COPD (chronic obstructive pulmonary disease) ([place])   . Dementia   . GERD (gastroesophageal reflux disease)   . Hypertension     No past surgical history on file.  No family history on file.  Social History   Social History  . Marital Status: Married    Spouse Name: N/A  . Number of Children: N/A  . Years of Education: N/A   Occupational History  . RETIRED    Social History Main Topics  . Smoking status: Former Research scientist (life sciences)  . Smokeless tobacco: None  . Alcohol Use: No  . Drug Use: No  . Sexual Activity: Not Asked   Other Topics Concern  . None   Social History Narrative   CB x 1      Regular exercise - NO     Current outpatient prescriptions:  .  Acetaminophen (TYLENOL 8 HOUR PO), Take by mouth., Disp: , Rfl:  .  amLODipine (NORVASC) 5 MG tablet, TAKE 1 TABLET BY MOUTH DAILY, Disp: 100 tablet, Rfl: 3 .  aspirin 81 MG tablet, Take 81 mg by mouth daily., Disp: , Rfl:  .  benzonatate (TESSALON PERLES) 100 MG capsule, Take 1 capsule (100 mg total) by mouth 3 (three) times daily as needed for cough., Disp: 20 capsule, Rfl: 0 .  Besifloxacin HCl (BESIVANCE) 0.6 % SUSP, Apply to eye., Disp: , Rfl:  .  bromfenac (XIBROM) 0.09 % ophthalmic solution, 1 drop 2 (two) times daily., Disp: , Rfl:  .  cyclobenzaprine (FLEXERIL) 5 MG tablet, One half tab daily at bedtime when necessary, Disp: 50 tablet, Rfl: 3 .  doxycycline (VIBRAMYCIN) 100 MG capsule, Take 1 capsule (100 mg total) by mouth 2 (two) times daily., Disp: 20  capsule, Rfl: 0 .  FLUoxetine (PROZAC) 10 MG capsule, TAKE 1 CAPSULE BY MOUTH DAILY, Disp: 100 capsule, Rfl: 3 .  furosemide (LASIX) 20 MG tablet, Take 1.5 tablets (30 mg total) by mouth daily., Disp: 100 tablet, Rfl: 3 .  ketorolac (ACULAR) 0.4 % SOLN, 1 drop 4 (four) times daily., Disp: , Rfl:  .  levothyroxine (SYNTHROID, LEVOTHROID) 50 MCG tablet, TAKE 1 TABLET BY MOUTH DAILY, Disp: 100 tablet, Rfl: 3 .  NON FORMULARY, Ear pain drops, Disp: , Rfl:  .  NON FORMULARY, cloracidine, Disp: , Rfl:  .  nystatin-triamcinolone ointment (MYCOLOG), APPLY TOPICALLY TWICE DAILY, Disp: 60 g, Rfl: 3 .  potassium chloride SA (K-DUR,KLOR-CON) 20 MEQ tablet, TAKE 1 TABLET BY MOUTH ONCE DAILY, Disp: 90 tablet, Rfl: 3 .  prednisoLONE acetate (PRED MILD) 0.12 % ophthalmic suspension, 1 drop 4 (four) times daily., Disp: , Rfl:  .  predniSONE (DELTASONE) 10 MG tablet, Take 3 tablets (30 mg total) by mouth daily with breakfast., Disp: 12 tablet, Rfl: 0 .  risperiDONE (RISPERDAL) 1 MG tablet, TAKE 1 TABLET BY MOUTH TWICE DAILY, Disp: 200 tablet, Rfl: 3 .  traMADol (ULTRAM) 50 MG tablet, One half tab daily at bedtime when necessary, Disp: 50 tablet, Rfl: 2 .  albuterol (PROVENTIL HFA;VENTOLIN HFA) 108 (90 Base) MCG/ACT inhaler, Inhale 2 puffs into  the lungs every 6 (six) hours as needed., Disp: 1 Inhaler, Rfl: 0 .  fluticasone (FLONASE) 50 MCG/ACT nasal spray, Place 2 sprays into both nostrils daily., Disp: 16 g, Rfl: 1  EXAM:  Filed Vitals:   04/10/15 1346  BP: 124/80  Pulse: 75  Temp: 97.7 F (36.5 C)    Body mass index is 26.58 kg/(m^2).  GENERAL: vitals reviewed and listed above, alert, oriented, appears well hydrated and in no acute distress  HEENT: atraumatic, conjunttiva clear, no obvious abnormalities on inspection of external nose and ears, normal appearance of ear canals and TMs, clear nasal congestion, mild post oropharyngeal erythema with PND, no tonsillar edema or exudate, no sinus TTP  NECK:  no obvious masses on inspection  LUNGS: clear to auscultation bilaterally, no wheezes, rales or rhonchi, good air movement  CV: HRRR, no peripheral edema  MS: moves all extremities without noticeable abnormality  PSYCH: pleasant and cooperative, no obvious depression or anxiety  ASSESSMENT AND PLAN:  Discussed the following assessment and plan:  Allergic rhinitis, unspecified allergic rhinitis type  Chronic obstructive pulmonary disease, unspecified COPD type ([place])  - Her lungs sound great today and she is feeling much better -Trial INS  For the postnasal drip,  Likely seasonal allergies. Trial of albuterol for occasional wheezing. -Patient advised to return or notify a doctor immediately if symptoms worsen or persist or new concerns arise.  Patient Instructions   I am so glad that you are feeling better.   Please use the Flonase, 2 sprays each nostril daily for 1 month for allergy symptoms, nasal dripping and drainage in the throat.  Can try the albuterol for mild wheezing or excessive cough.   Follow-up with your primary doctor if any persistent or worsening symptoms and for your regular follow-up appointments.     Colin Benton R.

## 2015-05-03 DIAGNOSIS — R062 Wheezing: Secondary | ICD-10-CM | POA: Diagnosis not present

## 2015-05-03 DIAGNOSIS — J208 Acute bronchitis due to other specified organisms: Secondary | ICD-10-CM | POA: Diagnosis not present

## 2015-05-03 DIAGNOSIS — R05 Cough: Secondary | ICD-10-CM | POA: Diagnosis not present

## 2015-05-07 ENCOUNTER — Encounter: Payer: Self-pay | Admitting: Adult Health

## 2015-05-07 ENCOUNTER — Ambulatory Visit (INDEPENDENT_AMBULATORY_CARE_PROVIDER_SITE_OTHER): Payer: Commercial Managed Care - HMO | Admitting: Adult Health

## 2015-05-07 VITALS — BP 126/80 | Temp 97.7°F | Wt 158.8 lb

## 2015-05-07 DIAGNOSIS — J441 Chronic obstructive pulmonary disease with (acute) exacerbation: Secondary | ICD-10-CM | POA: Diagnosis not present

## 2015-05-07 MED ORDER — IPRATROPIUM-ALBUTEROL 0.5-2.5 (3) MG/3ML IN SOLN
3.0000 mL | Freq: Once | RESPIRATORY_TRACT | Status: AC
  Administered 2015-05-07: 3 mL via RESPIRATORY_TRACT

## 2015-05-07 MED ORDER — ALBUTEROL SULFATE (2.5 MG/3ML) 0.083% IN NEBU: 2.5000 mg | INHALATION_SOLUTION | Freq: Four times a day (QID) | RESPIRATORY_TRACT | Status: DC | PRN

## 2015-05-07 MED ORDER — PREDNISONE 20 MG PO TABS: ORAL_TABLET | ORAL | Status: DC

## 2015-05-07 NOTE — Patient Instructions (Signed)
It was great meeting you today!  I have sent in a new prescription for prednisone. Take 40 mg ( 2 pills) daily x 7 days.   Use the nebulizer every 6 hours as needed  Use the Anoro Inhaler each morning  Follow up in one week

## 2015-05-07 NOTE — Progress Notes (Signed)
   Subjective:    Patient ID: Donna Arias, female    DOB: 04-Jul-1926, 82 y.o.   MRN: [id number]  HPI  82 year old female who presents to the office for follow up since being seen at [place] on 4 days ago and was diagnosed with bronchitis and was started on Doxycycline and Prednisone 30 mg taper.   Previous to this she was seen bu Dr. Maudie Mercury on 03/27/2015 and was given prednisone and doxycycline for COPD exacerbation.   Today she reports that she is not feeling any better. Her daughter report that the antibiotics are keeping her symptoms at [place].   She continues to have a productive cough ( yellow mucus). She is not running a fever and is not feeling acutely ill. She does complain of headache and a " tickle in my throat.   She does have a history of COPD with emphysema.   She does have seasonal allergies and uses OTC treatment.    Review of Systems  Constitutional: Positive for fatigue. Negative for chills.  HENT: Positive for congestion, ear pain, rhinorrhea and sore throat. Negative for ear discharge and trouble swallowing.   Respiratory: Positive for cough, shortness of breath and wheezing.   Cardiovascular: Negative.   Neurological: Positive for headaches.  All other systems reviewed and are negative.      Objective:   Physical Exam  Constitutional: She is oriented to person, place, and time. She appears well-developed and well-nourished. No distress.  HENT:  Head: Normocephalic and atraumatic.  Right Ear: External ear normal.  Left Ear: External ear normal.  Nose: Nose normal.  Mouth/Throat: Oropharynx is clear and moist. No oropharyngeal exudate.  Eyes: Conjunctivae and EOM are normal. Pupils are equal, round, and reactive to light. Right eye exhibits no discharge. Left eye exhibits no discharge.  Neck: Normal range of motion. Neck supple.  Cardiovascular: Normal rate, regular rhythm, normal heart sounds and intact distal pulses.  Exam reveals no gallop.   No murmur  heard. Pulmonary/Chest: Effort normal. No respiratory distress. She has wheezes in the right upper field, the right middle field, the right lower field, the left upper field, the left middle field and the left lower field. She has no rhonchi. She has no rales. She exhibits no tenderness.  Lymphadenopathy:    She has no cervical adenopathy.  Neurological: She is alert and oriented to person, place, and time.  Skin: Skin is warm and dry. No rash noted. She is not diaphoretic. No erythema. No pallor.  Psychiatric: She has a normal mood and affect. Her behavior is normal. Judgment and thought content normal.  Nursing note and vitals reviewed.     Assessment & Plan:  1. COPD exacerbation ([place]) - ipratropium-albuterol (DUONEB) 0.5-2.5 (3) MG/3ML nebulizer solution 3 mL; Take 3 mLs by nebulization once. - albuterol (PROVENTIL) (2.5 MG/3ML) 0.083% nebulizer solution; Take 3 mLs (2.5 mg total) by nebulization every 6 (six) hours as needed for wheezing or shortness of breath.  Dispense: 75 mL; Refill: 12 - predniSONE (DELTASONE) 20 MG tablet; Take two tablets by mouth daily x 7 days  Dispense: 14 tablet; Refill: 0 - Sample of Anoro given - she is to use one time daily.  - Follow up in one week - She reported being able to breath easier after neb. Wheezing much improved and is moving oxygen better post neb on exam  Dorothyann Peng, NP

## 2015-05-14 ENCOUNTER — Encounter: Payer: Self-pay | Admitting: Adult Health

## 2015-05-14 ENCOUNTER — Ambulatory Visit (INDEPENDENT_AMBULATORY_CARE_PROVIDER_SITE_OTHER): Payer: Commercial Managed Care - HMO | Admitting: Adult Health

## 2015-05-14 DIAGNOSIS — Z09 Encounter for follow-up examination after completed treatment for conditions other than malignant neoplasm: Secondary | ICD-10-CM | POA: Diagnosis not present

## 2015-05-14 DIAGNOSIS — Z760 Encounter for issue of repeat prescription: Secondary | ICD-10-CM | POA: Diagnosis not present

## 2015-05-14 MED ORDER — FUROSEMIDE 20 MG PO TABS: 30.0000 mg | ORAL_TABLET | Freq: Every day | ORAL | Status: DC

## 2015-05-14 MED ORDER — UMECLIDINIUM-VILANTEROL 62.5-25 MCG/INH IN AEPB: 1.0000 | INHALATION_SPRAY | Freq: Every day | RESPIRATORY_TRACT | Status: DC

## 2015-05-14 MED ORDER — POTASSIUM CHLORIDE CRYS ER 20 MEQ PO TBCR: EXTENDED_RELEASE_TABLET | ORAL | Status: DC

## 2015-05-14 MED ORDER — RISPERIDONE 1 MG PO TABS: 1.0000 mg | ORAL_TABLET | Freq: Two times a day (BID) | ORAL | Status: DC

## 2015-05-14 NOTE — Patient Instructions (Signed)
It was great seeing you again and I am so happy you are feeling better!  I have sent in a new prescription for the inhaler, continue to use it daily.   I have also sent in refills for Lasix, potassium, and Risperidone.   Follow up as needed.

## 2015-05-14 NOTE — Progress Notes (Signed)
Subjective:    Patient ID: Donna Arias, female    DOB: 07-10-1926, 82 y.o.   MRN: [id number]  HPI  82 year old female who presents to the office today for one week follow up for COPD flare. I had given her a week of prednisone and a sample of Anoro. She reports that she likes the Anoro inhaler and feels as though it is much easier to use. She also feels like she is breathing better.   She continues to have a slight cough with minimal mucus production.   Review of Systems  Constitutional: Negative.   Respiratory: Positive for cough. Negative for shortness of breath, wheezing and stridor.   Cardiovascular: Negative.   Neurological: Negative.   All other systems reviewed and are negative.  Past Medical History  Diagnosis Date  . COPD (chronic obstructive pulmonary disease) ([place])   . Dementia   . GERD (gastroesophageal reflux disease)   . Hypertension     Social History   Social History  . Marital Status: Married    Spouse Name: N/A  . Number of Children: N/A  . Years of Education: N/A   Occupational History  . RETIRED    Social History Main Topics  . Smoking status: Former Research scientist (life sciences)  . Smokeless tobacco: Not on file  . Alcohol Use: No  . Drug Use: No  . Sexual Activity: Not on file   Other Topics Concern  . Not on file   Social History Narrative   CB x 1      Regular exercise - NO    No past surgical history on file.  No family history on file.  Allergies  Allergen Reactions  . Codeine Sulfate     REACTION: unspecified    Current Outpatient Prescriptions on File Prior to Visit  Medication Sig Dispense Refill  . Acetaminophen (TYLENOL 8 HOUR PO) Take by mouth.    [name] albuterol (PROVENTIL HFA;VENTOLIN HFA) 108 (90 Base) MCG/ACT inhaler Inhale 2 puffs into the lungs every 6 (six) hours as needed. 1 Inhaler 0  . albuterol (PROVENTIL) (2.5 MG/3ML) 0.083% nebulizer solution Take 3 mLs (2.5 mg total) by nebulization every 6 (six) hours as needed for wheezing or  shortness of breath. 75 mL 12  . amLODipine (NORVASC) 5 MG tablet TAKE 1 TABLET BY MOUTH DAILY 100 tablet 3  . aspirin 81 MG tablet Take 81 mg by mouth daily.    [name] Besifloxacin HCl (BESIVANCE) 0.6 % SUSP Apply to eye.    . bromfenac (XIBROM) 0.09 % ophthalmic solution 1 drop 2 (two) times daily.    . cyclobenzaprine (FLEXERIL) 5 MG tablet One half tab daily at bedtime when necessary 50 tablet 3  . FLUoxetine (PROZAC) 10 MG capsule TAKE 1 CAPSULE BY MOUTH DAILY 100 capsule 3  . fluticasone (FLONASE) 50 MCG/ACT nasal spray Place 2 sprays into both nostrils daily. 16 g 1  . ketorolac (ACULAR) 0.4 % SOLN 1 drop 4 (four) times daily.    [name] levothyroxine (SYNTHROID, LEVOTHROID) 50 MCG tablet TAKE 1 TABLET BY MOUTH DAILY 100 tablet 3  . NON FORMULARY Ear pain drops    . NON FORMULARY cloracidine    . nystatin-triamcinolone ointment (MYCOLOG) APPLY TOPICALLY TWICE DAILY 60 g 3  . prednisoLONE acetate (PRED MILD) 0.12 % ophthalmic suspension 1 drop 4 (four) times daily.    . traMADol (ULTRAM) 50 MG tablet One half tab daily at bedtime when necessary 50 tablet 2   No current facility-administered medications  on file prior to visit.    BP 116/70 mmHg  Temp(Src) 98.1 F (36.7 C) (Oral)  Wt 161 lb (73.029 kg)       Objective:   Physical Exam  Constitutional: She is oriented to person, place, and time. She appears well-developed and well-nourished. No distress.  Cardiovascular: Normal rate, regular rhythm, normal heart sounds and intact distal pulses.  Exam reveals no gallop and no friction rub.   No murmur heard. Pulmonary/Chest: Effort normal and breath sounds normal. No respiratory distress. She has no wheezes. She has no rales. She exhibits no tenderness.  Neurological: She is alert and oriented to person, place, and time.  Skin: Skin is warm and dry. No rash noted. She is not diaphoretic. No erythema. No pallor.  Psychiatric: She has a normal mood and affect. Her behavior is normal. Judgment  and thought content normal.  Nursing note and vitals reviewed.     Assessment & Plan:  1. Follow-up exam - Her lung sounds are clear and she looks much improved. Will continue her on Anoro daily.  - umeclidinium-vilanterol (ANORO ELLIPTA) 62.5-25 MCG/INH AEPB; Inhale 1 puff into the lungs daily.  Dispense: 1 each; Refill: 11 - Follow up as needed.   2. Medication refill  - risperiDONE (RISPERDAL) 1 MG tablet; Take 1 tablet (1 mg total) by mouth 2 (two) times daily.  Dispense: 200 tablet; Refill: 3 - furosemide (LASIX) 20 MG tablet; Take 1.5 tablets (30 mg total) by mouth daily.  Dispense: 100 tablet; Refill: 3 - potassium chloride SA (K-DUR,KLOR-CON) 20 MEQ tablet; TAKE 1 TABLET BY MOUTH ONCE DAILY  Dispense: 90 tablet; Refill: 3  Dorothyann Peng, NP

## 2015-08-01 ENCOUNTER — Encounter: Payer: Self-pay | Admitting: Adult Health

## 2015-08-01 ENCOUNTER — Ambulatory Visit (INDEPENDENT_AMBULATORY_CARE_PROVIDER_SITE_OTHER): Payer: Commercial Managed Care - HMO | Admitting: Adult Health

## 2015-08-01 VITALS — BP 102/70 | Temp 97.9°F | Ht 66.0 in | Wt 160.2 lb

## 2015-08-01 DIAGNOSIS — Z0000 Encounter for general adult medical examination without abnormal findings: Secondary | ICD-10-CM

## 2015-08-01 DIAGNOSIS — I10 Essential (primary) hypertension: Secondary | ICD-10-CM

## 2015-08-01 DIAGNOSIS — M549 Dorsalgia, unspecified: Secondary | ICD-10-CM

## 2015-08-01 DIAGNOSIS — M546 Pain in thoracic spine: Secondary | ICD-10-CM

## 2015-08-01 DIAGNOSIS — Z760 Encounter for issue of repeat prescription: Secondary | ICD-10-CM | POA: Diagnosis not present

## 2015-08-01 LAB — BASIC METABOLIC PANEL
BUN: 17 mg/dL (ref 6–23)
CALCIUM: 9.6 mg/dL (ref 8.4–10.5)
CO2: 28 mEq/L (ref 19–32)
CREATININE: 1.2 mg/dL (ref 0.40–1.20)
Chloride: 109 mEq/L (ref 96–112)
GFR: 45.15 mL/min — AB (ref >60.00)
Glucose, Bld: 108 mg/dL — ABNORMAL HIGH (ref 70–99)
Potassium: 4.1 mEq/L (ref 3.5–5.1)
Sodium: 144 mEq/L (ref 135–145)

## 2015-08-01 LAB — CBC WITH DIFFERENTIAL/PLATELET
BASOS ABS: 0 10*3/uL (ref 0.0–0.1)
BASOS PCT: 0.6 % (ref 0.0–3.0)
EOS ABS: 0.3 10*3/uL (ref 0.0–0.7)
Eosinophils Relative: 4.4 % (ref 0.0–5.0)
HCT: 43.1 % (ref 36.0–46.0)
HEMOGLOBIN: 14.3 g/dL (ref 12.0–15.0)
Lymphocytes Relative: 29 % (ref 12.0–46.0)
Lymphs Abs: 1.7 10*3/uL (ref 0.7–4.0)
MCHC: 33.2 g/dL (ref 30.0–36.0)
MCV: 95.5 fl (ref 78.0–100.0)
MONO ABS: 0.5 10*3/uL (ref 0.1–1.0)
Monocytes Relative: 7.5 % (ref 3.0–12.0)
Neutro Abs: 3.5 10*3/uL (ref 1.4–7.7)
Neutrophils Relative %: 58.5 % (ref 43.0–77.0)
Platelets: 191 10*3/uL (ref 150.0–400.0)
RBC: 4.52 Mil/uL (ref 3.87–5.11)
RDW: 15.4 % (ref 11.5–15.5)
WBC: 6 10*3/uL (ref 4.0–10.5)

## 2015-08-01 LAB — LIPID PANEL
CHOLESTEROL: 250 mg/dL — AB (ref 0–200)
HDL: 60.3 mg/dL (ref >39.00)
LDL Cholesterol: 165 mg/dL — ABNORMAL HIGH (ref 0–99)
NonHDL: 190.1
TRIGLYCERIDES: 125 mg/dL (ref 0.0–149.0)
Total CHOL/HDL Ratio: 4
VLDL: 25 mg/dL (ref 0.0–40.0)

## 2015-08-01 LAB — POC URINALSYSI DIPSTICK (AUTOMATED)
BILIRUBIN UA: NEGATIVE
Glucose, UA: NEGATIVE
KETONES UA: NEGATIVE
Nitrite, UA: NEGATIVE
PH UA: 5.5
PROTEIN UA: NEGATIVE
RBC UA: NEGATIVE
Spec Grav, UA: 1.01
Urobilinogen, UA: 0.2

## 2015-08-01 LAB — HEPATIC FUNCTION PANEL
ALBUMIN: 4 g/dL (ref 3.5–5.2)
ALT: 16 U/L (ref 0–35)
AST: 23 U/L (ref 0–37)
Alkaline Phosphatase: 84 U/L (ref 39–117)
Bilirubin, Direct: 0.1 mg/dL (ref 0.0–0.3)
TOTAL PROTEIN: 6.6 g/dL (ref 6.0–8.3)
Total Bilirubin: 0.4 mg/dL (ref 0.2–1.2)

## 2015-08-01 LAB — TSH: TSH: 3.45 u[IU]/mL (ref 0.35–4.50)

## 2015-08-01 MED ORDER — TRAMADOL HCL 50 MG PO TABS: 50.0000 mg | ORAL_TABLET | Freq: Three times a day (TID) | ORAL | Status: DC | PRN

## 2015-08-01 MED ORDER — CYCLOBENZAPRINE HCL 5 MG PO TABS: ORAL_TABLET | ORAL | Status: DC

## 2015-08-01 MED ORDER — NYSTATIN-TRIAMCINOLONE 100000-0.1 UNIT/GM-% EX OINT: TOPICAL_OINTMENT | CUTANEOUS | Status: DC

## 2015-08-01 NOTE — Progress Notes (Signed)
Subjective:    Patient ID: Donna Arias, female    DOB: [birth year], 82 y.o.   MRN: [id number]  HPI  Patient presents for yearly preventative medicine examination. She is a pleasant And remarkable 82 year old female who  has a past medical history of COPD (chronic obstructive pulmonary disease) ([place]); Dementia; GERD (gastroesophageal reflux disease); and Hypertension. Her daughter were present at this examination  All immunizations and health maintenance protocols were reviewed with the patient and needed orders were placed.  Medication reconciliation,  past medical history, social history, problem list and allergies were reviewed in detail with the patient  Goals were established with regard to weight loss, exercise, and  diet in compliance with medications  End of life planning was discussed.  She has no acute issues that she would like to talk about today  Review of Systems  Constitutional: Negative.   HENT: Negative.   Eyes: Negative.   Respiratory: Negative.   Cardiovascular: Negative.   Gastrointestinal: Negative.   Endocrine: Negative.   Genitourinary: Negative.   Musculoskeletal: Positive for back pain, arthralgias and gait problem. Negative for myalgias, joint swelling, neck pain and neck stiffness.  Skin: Negative.   Allergic/Immunologic: Negative.   Neurological: Negative.   Hematological: Negative.   Psychiatric/Behavioral: Negative.   All other systems reviewed and are negative.  Past Medical History  Diagnosis Date  . COPD (chronic obstructive pulmonary disease) ([place])   . Dementia   . GERD (gastroesophageal reflux disease)   . Hypertension     Social History   Social History  . Marital Status: Married    Spouse Name: N/A  . Number of Children: N/A  . Years of Education: N/A   Occupational History  . RETIRED    Social History Main Topics  . Smoking status: Former Research scientist (life sciences)  . Smokeless tobacco: Not on file  . Alcohol Use: No  . Drug Use: No  .  Sexual Activity: Not on file   Other Topics Concern  . Not on file   Social History Narrative   CB x 1      Regular exercise - NO    No past surgical history on file.  No family history on file.  Allergies  Allergen Reactions  . Codeine Sulfate     REACTION: unspecified    Current Outpatient Prescriptions on File Prior to Visit  Medication Sig Dispense Refill  . Acetaminophen (TYLENOL 8 HOUR PO) Take by mouth.    [name] albuterol (PROVENTIL HFA;VENTOLIN HFA) 108 (90 Base) MCG/ACT inhaler Inhale 2 puffs into the lungs every 6 (six) hours as needed. 1 Inhaler 0  . albuterol (PROVENTIL) (2.5 MG/3ML) 0.083% nebulizer solution Take 3 mLs (2.5 mg total) by nebulization every 6 (six) hours as needed for wheezing or shortness of breath. 75 mL 12  . amLODipine (NORVASC) 5 MG tablet TAKE 1 TABLET BY MOUTH DAILY 100 tablet 3  . aspirin 81 MG tablet Take 81 mg by mouth daily.    [name] Besifloxacin HCl (BESIVANCE) 0.6 % SUSP Apply to eye.    . bromfenac (XIBROM) 0.09 % ophthalmic solution 1 drop 2 (two) times daily.    [name] FLUoxetine (PROZAC) 10 MG capsule TAKE 1 CAPSULE BY MOUTH DAILY 100 capsule 3  . fluticasone (FLONASE) 50 MCG/ACT nasal spray Place 2 sprays into both nostrils daily. 16 g 1  . furosemide (LASIX) 20 MG tablet Take 1.5 tablets (30 mg total) by mouth daily. 100 tablet 3  . ketorolac (ACULAR) 0.4 %  SOLN 1 drop 4 (four) times daily.    [name] levothyroxine (SYNTHROID, LEVOTHROID) 50 MCG tablet TAKE 1 TABLET BY MOUTH DAILY 100 tablet 3  . NON FORMULARY Ear pain drops    . NON FORMULARY cloracidine    . potassium chloride SA (K-DUR,KLOR-CON) 20 MEQ tablet TAKE 1 TABLET BY MOUTH ONCE DAILY 90 tablet 3  . prednisoLONE acetate (PRED MILD) 0.12 % ophthalmic suspension 1 drop 4 (four) times daily.    . risperiDONE (RISPERDAL) 1 MG tablet Take 1 tablet (1 mg total) by mouth 2 (two) times daily. 200 tablet 3  . umeclidinium-vilanterol (ANORO ELLIPTA) 62.5-25 MCG/INH AEPB Inhale 1 puff into the lungs  daily. 1 each 11   No current facility-administered medications on file prior to visit.    BP 102/70 mmHg  Temp(Src) 97.9 F (36.6 C) (Oral)  Ht 5\' 6"  (1.676 m)  Wt 160 lb 3.2 oz (72.666 kg)  BMI 25.87 kg/m2       Objective:   Physical Exam  Constitutional: She is oriented to person, place, and time. She appears well-developed and well-nourished. No distress.  HENT:  Head: Normocephalic and atraumatic.  Right Ear: External ear normal.  Left Ear: External ear normal.  Nose: Nose normal.  Mouth/Throat: Oropharynx is clear and moist. No oropharyngeal exudate.  Hard of hearing  Eyes: Conjunctivae and EOM are normal. Pupils are equal, round, and reactive to light. Right eye exhibits no discharge. Left eye exhibits no discharge. No scleral icterus.  Neck: Normal range of motion. Neck supple. No thyromegaly present.  Cardiovascular: Normal rate, regular rhythm, normal heart sounds and intact distal pulses.  Exam reveals no gallop and no friction rub.   No murmur heard. Pulmonary/Chest: Effort normal and breath sounds normal. No respiratory distress. She has no wheezes. She has no rales. She exhibits no tenderness.  Abdominal: Soft. Bowel sounds are normal.  Genitourinary:  Deferred  Musculoskeletal: She exhibits tenderness (Scattered arthritic pain). She exhibits no edema.  She is able to get from seated position onto exam table with minimal assistance  Lymphadenopathy:    She has no cervical adenopathy.  Neurological: She is alert and oriented to person, place, and time. She has normal reflexes. She displays normal reflexes. No cranial nerve deficit. She exhibits normal muscle tone. Coordination normal.  Skin: Skin is warm and dry. No rash noted. She is not diaphoretic. No erythema. No pallor.  Psychiatric: She has a normal mood and affect. Her behavior is normal. Judgment and thought content normal.  Nursing note and vitals reviewed.     Assessment & Plan:  1. Routine general  medical examination at a health care facility - EKG 12-Lead- Sinus  Rhythm  - frequent ectopic ventricular beat s  # VECs = 3 -  Diffuse nonspecific T-abnormality.   Low voltage with rightward P-axis and rotation -possible pulmonary disease.  Rate 77  - POCT Urinalysis Dipstick (Automated) - Basic metabolic panel - CBC with Differential/Platelet - Hepatic function panel - Lipid panel - TSH  2. Essential hypertension - Well-controlled on current medication no change at this time - EKG 12-Lead - POCT Urinalysis Dipstick (Automated) - Basic metabolic panel - CBC with Differential/Platelet - Hepatic function panel - Lipid panel - TSH - Continue  to monitor 3. Upper back pain - cyclobenzaprine (FLEXERIL) 5 MG tablet; One half tab daily at bedtime when necessary  Dispense: 50 tablet; Refill: 3 - traMADol (ULTRAM) 50 MG tablet; Take 1 tablet (50 mg total) by mouth every 8 (  eight) hours as needed.  Dispense: 30 tablet; Refill: 0  4. Medication refill - cyclobenzaprine (FLEXERIL) 5 MG tablet; One half tab daily at bedtime when necessary  Dispense: 50 tablet; Refill: 3 - nystatin-triamcinolone ointment (MYCOLOG); APPLY TOPICALLY TWICE DAILY  Dispense: 60 g; Refill: 3 - traMADol (ULTRAM) 50 MG tablet; Take 1 tablet (50 mg total) by mouth every 8 (eight) hours as needed.  Dispense: 30 tablet; Refill: 0  Dorothyann Peng, NP

## 2015-08-01 NOTE — Patient Instructions (Addendum)
It was great seeing you today   I will follow up with you regarding your labs.   Continue to stay active   Follow up with me in 6 months    Health Maintenance, Female Adopting a healthy lifestyle and getting preventive care can go a long way to promote health and wellness. Talk with your health care provider about what schedule of regular examinations is right for you. This is a good chance for you to check in with your provider about disease prevention and staying healthy. In between checkups, there are plenty of things you can do on your own. Experts have done a lot of research about which lifestyle changes and preventive measures are most likely to keep you healthy. Ask your health care provider for more information. WEIGHT AND DIET  Eat a healthy diet  Be sure to include plenty of vegetables, fruits, low-fat dairy products, and lean protein.  Do not eat a lot of foods high in solid fats, added sugars, or salt.  Get regular exercise. This is one of the most important things you can do for your health.  Most adults should exercise for at least 150 minutes each week. The exercise should increase your heart rate and make you sweat (moderate-intensity exercise).  Most adults should also do strengthening exercises at least twice a week. This is in addition to the moderate-intensity exercise.  Maintain a healthy weight  Body mass index (BMI) is a measurement that can be used to identify possible weight problems. It estimates body fat based on height and weight. Your health care provider can help determine your BMI and help you achieve or maintain a healthy weight.  For females 32 years of age and older:   A BMI below 18.5 is considered underweight.  A BMI of 18.5 to 24.9 is normal.  A BMI of 25 to 29.9 is considered overweight.  A BMI of 30 and above is considered obese.  Watch levels of cholesterol and blood lipids  You should start having your blood tested for lipids and  cholesterol at 80 years of age, then have this test every 5 years.  You may need to have your cholesterol levels checked more often if:  Your lipid or cholesterol levels are high.  You are older than 80 years of age.  You are at high risk for heart disease.  CANCER SCREENING   Lung Cancer  Lung cancer screening is recommended for adults 22-58 years old who are at high risk for lung cancer because of a history of smoking.  A yearly low-dose CT scan of the lungs is recommended for people who:  Currently smoke.  Have quit within the past 15 years.  Have at least a 30-pack-year history of smoking. A pack year is smoking an average of one pack of cigarettes a day for 1 year.  Yearly screening should continue until it has been 15 years since you quit.  Yearly screening should stop if you develop a health problem that would prevent you from having lung cancer treatment.  Breast Cancer  Practice breast self-awareness. This means understanding how your breasts normally appear and feel.  It also means doing regular breast self-exams. Let your health care provider know about any changes, no matter how small.  If you are in your 20s or 30s, you should have a clinical breast exam (CBE) by a health care provider every 1-3 years as part of a regular health exam.  If you are 40 or older, have  a CBE every year. Also consider having a breast X-ray (mammogram) every year.  If you have a family history of breast cancer, talk to your health care provider about genetic screening.  If you are at high risk for breast cancer, talk to your health care provider about having an MRI and a mammogram every year.  Breast cancer gene (BRCA) assessment is recommended for women who have family members with BRCA-related cancers. BRCA-related cancers include:  Breast.  Ovarian.  Tubal.  Peritoneal cancers.  Results of the assessment will determine the need for genetic counseling and BRCA1 and BRCA2  testing. Cervical Cancer Your health care provider may recommend that you be screened regularly for cancer of the pelvic organs (ovaries, uterus, and vagina). This screening involves a pelvic examination, including checking for microscopic changes to the surface of your cervix (Pap test). You may be encouraged to have this screening done every 3 years, beginning at age 64.  For women ages 57-65, health care providers may recommend pelvic exams and Pap testing every 3 years, or they may recommend the Pap and pelvic exam, combined with testing for human papilloma virus (HPV), every 5 years. Some types of HPV increase your risk of cervical cancer. Testing for HPV may also be done on women of any age with unclear Pap test results.  Other health care providers may not recommend any screening for nonpregnant women who are considered low risk for pelvic cancer and who do not have symptoms. Ask your health care provider if a screening pelvic exam is right for you.  If you have had past treatment for cervical cancer or a condition that could lead to cancer, you need Pap tests and screening for cancer for at least 20 years after your treatment. If Pap tests have been discontinued, your risk factors (such as having a new sexual partner) need to be reassessed to determine if screening should resume. Some women have medical problems that increase the chance of getting cervical cancer. In these cases, your health care provider may recommend more frequent screening and Pap tests. Colorectal Cancer  This type of cancer can be detected and often prevented.  Routine colorectal cancer screening usually begins at 80 years of age and continues through 80 years of age.  Your health care provider may recommend screening at an earlier age if you have risk factors for colon cancer.  Your health care provider may also recommend using home test kits to check for hidden blood in the stool.  A small camera at the end of a  tube can be used to examine your colon directly (sigmoidoscopy or colonoscopy). This is done to check for the earliest forms of colorectal cancer.  Routine screening usually begins at age 4.  Direct examination of the colon should be repeated every 5-10 years through 80 years of age. However, you may need to be screened more often if early forms of precancerous polyps or small growths are found. Skin Cancer  Check your skin from head to toe regularly.  Tell your health care provider about any new moles or changes in moles, especially if there is a change in a mole's shape or color.  Also tell your health care provider if you have a mole that is larger than the size of a pencil eraser.  Always use sunscreen. Apply sunscreen liberally and repeatedly throughout the day.  Protect yourself by wearing long sleeves, pants, a wide-brimmed hat, and sunglasses whenever you are outside. HEART DISEASE, DIABETES, AND HIGH  BLOOD PRESSURE   High blood pressure causes heart disease and increases the risk of stroke. High blood pressure is more likely to develop in:  People who have blood pressure in the high end of the normal range (130-139/85-89 mm Hg).  People who are overweight or obese.  People who are African American.  If you are 88-44 years of age, have your blood pressure checked every 3-5 years. If you are 63 years of age or older, have your blood pressure checked every year. You should have your blood pressure measured twice--once when you are at a hospital or clinic, and once when you are not at a hospital or clinic. Record the average of the two measurements. To check your blood pressure when you are not at a hospital or clinic, you can use:  An automated blood pressure machine at a pharmacy.  A home blood pressure monitor.  If you are between 72 years and 20 years old, ask your health care provider if you should take aspirin to prevent strokes.  Have regular diabetes screenings. This  involves taking a blood sample to check your fasting blood sugar level.  If you are at a normal weight and have a low risk for diabetes, have this test once every three years after 80 years of age.  If you are overweight and have a high risk for diabetes, consider being tested at a younger age or more often. PREVENTING INFECTION  Hepatitis B  If you have a higher risk for hepatitis B, you should be screened for this virus. You are considered at high risk for hepatitis B if:  You were born in a country where hepatitis B is common. Ask your health care provider which countries are considered high risk.  Your parents were born in a high-risk country, and you have not been immunized against hepatitis B (hepatitis B vaccine).  You have HIV or AIDS.  You use needles to inject street drugs.  You live with someone who has hepatitis B.  You have had sex with someone who has hepatitis B.  You get hemodialysis treatment.  You take certain medicines for conditions, including cancer, organ transplantation, and autoimmune conditions. Hepatitis C  Blood testing is recommended for:  Everyone born from [birth year] through 1963.  Anyone with known risk factors for hepatitis C. Sexually transmitted infections (STIs)  You should be screened for sexually transmitted infections (STIs) including gonorrhea and chlamydia if:  You are sexually active and are younger than 80 years of age.  You are older than 80 years of age and your health care provider tells you that you are at risk for this type of infection.  Your sexual activity has changed since you were last screened and you are at an increased risk for chlamydia or gonorrhea. Ask your health care provider if you are at risk.  If you do not have HIV, but are at risk, it may be recommended that you take a prescription medicine daily to prevent HIV infection. This is called pre-exposure prophylaxis (PrEP). You are considered at risk if:  You are  sexually active and do not regularly use condoms or know the HIV status of your partner(s).  You take drugs by injection.  You are sexually active with a partner who has HIV. Talk with your health care provider about whether you are at high risk of being infected with HIV. If you choose to begin PrEP, you should first be tested for HIV. You should then be tested every  3 months for as long as you are taking PrEP.  PREGNANCY   If you are premenopausal and you may become pregnant, ask your health care provider about preconception counseling.  If you may become pregnant, take 400 to 800 micrograms (mcg) of folic acid every day.  If you want to prevent pregnancy, talk to your health care provider about birth control (contraception). OSTEOPOROSIS AND MENOPAUSE   Osteoporosis is a disease in which the bones lose minerals and strength with aging. This can result in serious bone fractures. Your risk for osteoporosis can be identified using a bone density scan.  If you are 70 years of age or older, or if you are at risk for osteoporosis and fractures, ask your health care provider if you should be screened.  Ask your health care provider whether you should take a calcium or vitamin D supplement to lower your risk for osteoporosis.  Menopause may have certain physical symptoms and risks.  Hormone replacement therapy may reduce some of these symptoms and risks. Talk to your health care provider about whether hormone replacement therapy is right for you.  HOME CARE INSTRUCTIONS   Schedule regular health, dental, and eye exams.  Stay current with your immunizations.   Do not use any tobacco products including cigarettes, chewing tobacco, or electronic cigarettes.  If you are pregnant, do not drink alcohol.  If you are breastfeeding, limit how much and how often you drink alcohol.  Limit alcohol intake to no more than 1 drink per day for nonpregnant women. One drink equals 12 ounces of beer, 5  ounces of wine, or 1 ounces of hard liquor.  Do not use street drugs.  Do not share needles.  Ask your health care provider for help if you need support or information about quitting drugs.  Tell your health care provider if you often feel depressed.  Tell your health care provider if you have ever been abused or do not feel safe at home.   This information is not intended to replace advice given to you by your health care provider. Make sure you discuss any questions you have with your health care provider.   Document Released: 07/27/2010 Document Revised: 02/01/2014 Document Reviewed: 12/13/2012 Elsevier Interactive Patient Education [employer].

## 2015-08-04 ENCOUNTER — Telehealth: Payer: Self-pay | Admitting: Adult Health

## 2015-08-04 NOTE — Telephone Encounter (Signed)
Pt daughter [name] is returning jo ann call

## 2015-08-04 NOTE — Telephone Encounter (Signed)
See results note. 

## 2015-08-19 ENCOUNTER — Other Ambulatory Visit: Payer: Self-pay | Admitting: Adult Health

## 2015-08-19 ENCOUNTER — Telehealth: Payer: Self-pay | Admitting: Adult Health

## 2015-08-19 MED ORDER — TRIAMCINOLONE ACETONIDE 0.1 % EX CREA: 1.0000 "application " | TOPICAL_CREAM | Freq: Two times a day (BID) | CUTANEOUS | 0 refills | Status: DC

## 2015-08-19 NOTE — Telephone Encounter (Signed)
Please advise 

## 2015-08-19 NOTE — Telephone Encounter (Signed)
Pt can not afford nystatin triamcinolone ointment. Pt daughter is requesting triamcinolone or kenalog. [facility]

## 2015-11-10 ENCOUNTER — Ambulatory Visit (INDEPENDENT_AMBULATORY_CARE_PROVIDER_SITE_OTHER): Payer: Commercial Managed Care - HMO

## 2015-11-10 DIAGNOSIS — Z23 Encounter for immunization: Secondary | ICD-10-CM | POA: Diagnosis not present

## 2015-11-18 ENCOUNTER — Other Ambulatory Visit: Payer: Self-pay | Admitting: Family Medicine

## 2015-12-19 DIAGNOSIS — J0140 Acute pansinusitis, unspecified: Secondary | ICD-10-CM | POA: Diagnosis not present

## 2015-12-19 DIAGNOSIS — J208 Acute bronchitis due to other specified organisms: Secondary | ICD-10-CM | POA: Diagnosis not present

## 2016-02-04 ENCOUNTER — Ambulatory Visit (INDEPENDENT_AMBULATORY_CARE_PROVIDER_SITE_OTHER): Payer: Medicare HMO | Admitting: Adult Health

## 2016-02-04 ENCOUNTER — Encounter: Payer: Self-pay | Admitting: Adult Health

## 2016-02-04 ENCOUNTER — Other Ambulatory Visit: Payer: Self-pay | Admitting: Family Medicine

## 2016-02-04 VITALS — BP 100/56 | Temp 97.6°F | Ht 66.0 in | Wt 154.6 lb

## 2016-02-04 DIAGNOSIS — E039 Hypothyroidism, unspecified: Secondary | ICD-10-CM | POA: Diagnosis not present

## 2016-02-04 DIAGNOSIS — J0110 Acute frontal sinusitis, unspecified: Secondary | ICD-10-CM

## 2016-02-04 DIAGNOSIS — I10 Essential (primary) hypertension: Secondary | ICD-10-CM | POA: Diagnosis not present

## 2016-02-04 MED ORDER — AMLODIPINE BESYLATE 5 MG PO TABS: 5.0000 mg | ORAL_TABLET | Freq: Every day | ORAL | 3 refills | Status: DC

## 2016-02-04 MED ORDER — DOXYCYCLINE HYCLATE 100 MG PO CAPS: 100.0000 mg | ORAL_CAPSULE | Freq: Two times a day (BID) | ORAL | 0 refills | Status: DC

## 2016-02-04 MED ORDER — LEVOTHYROXINE SODIUM 50 MCG PO TABS: ORAL_TABLET | ORAL | 3 refills | Status: DC

## 2016-02-04 NOTE — Progress Notes (Signed)
Subjective:    Patient ID: Donna Arias, female    DOB: April 06, 1926, 83 y.o.   MRN: [id number]  HPI  83 year old female who  has a past medical history of COPD (chronic obstructive pulmonary disease) ([place]); Dementia; GERD (gastroesophageal reflux disease); and Hypertension. She presents to the office today for the acute complaint of sinusitis. She is with her daughter today at this visit. Her daughter reports that [name] is doing well but over the last week she has started to have sinus pain and pressure, non productive cough and rhinorrhea.   She denies any nausea, vomiting, diarrhea or fevers.   She also needs medication refills on Amlodipine and Synthroid    Review of Systems  Constitutional: Negative.   HENT: Positive for congestion, postnasal drip, rhinorrhea, sinus pain and sinus pressure. Negative for sore throat and trouble swallowing.   Respiratory: Positive for cough. Negative for chest tightness, shortness of breath and wheezing.   Cardiovascular: Negative.   Gastrointestinal: Negative.   Neurological: Negative.   All other systems reviewed and are negative.  Past Medical History:  Diagnosis Date  . COPD (chronic obstructive pulmonary disease) ([place])   . Dementia   . GERD (gastroesophageal reflux disease)   . Hypertension     Social History   Social History  . Marital status: Married    Spouse name: N/A  . Number of children: N/A  . Years of education: N/A   Occupational History  . RETIRED    Social History Main Topics  . Smoking status: Former Research scientist (life sciences)  . Smokeless tobacco: Not on file  . Alcohol use No  . Drug use: No  . Sexual activity: Not on file   Other Topics Concern  . Not on file   Social History Narrative   CB x 1      Regular exercise - NO    No past surgical history on file.  No family history on file.  Allergies  Allergen Reactions  . Codeine Sulfate     REACTION: unspecified    Current Outpatient Prescriptions on File Prior to  Visit  Medication Sig Dispense Refill  . Acetaminophen (TYLENOL 8 HOUR PO) Take by mouth.    [name] albuterol (PROVENTIL HFA;VENTOLIN HFA) 108 (90 Base) MCG/ACT inhaler Inhale 2 puffs into the lungs every 6 (six) hours as needed. 1 Inhaler 0  . albuterol (PROVENTIL) (2.5 MG/3ML) 0.083% nebulizer solution Take 3 mLs (2.5 mg total) by nebulization every 6 (six) hours as needed for wheezing or shortness of breath. 75 mL 12  . aspirin 81 MG tablet Take 81 mg by mouth daily.    [name] Besifloxacin HCl (BESIVANCE) 0.6 % SUSP Apply to eye.    . bromfenac (XIBROM) 0.09 % ophthalmic solution 1 drop 2 (two) times daily.    . cyclobenzaprine (FLEXERIL) 5 MG tablet One half tab daily at bedtime when necessary 50 tablet 3  . FLUoxetine (PROZAC) 10 MG capsule TAKE 1 CAPSULE BY MOUTH DAILY 100 capsule 2  . fluticasone (FLONASE) 50 MCG/ACT nasal spray Place 2 sprays into both nostrils daily. 16 g 1  . furosemide (LASIX) 20 MG tablet Take 1.5 tablets (30 mg total) by mouth daily. 100 tablet 3  . ketorolac (ACULAR) 0.4 % SOLN 1 drop 4 (four) times daily.    . NON FORMULARY Ear pain drops    . NON FORMULARY cloracidine    . nystatin-triamcinolone ointment (MYCOLOG) APPLY TOPICALLY TWICE DAILY 60 g 3  . potassium chloride SA (  K-DUR,KLOR-CON) 20 MEQ tablet TAKE 1 TABLET BY MOUTH ONCE DAILY 90 tablet 3  . prednisoLONE acetate (PRED MILD) 0.12 % ophthalmic suspension 1 drop 4 (four) times daily.    . risperiDONE (RISPERDAL) 1 MG tablet Take 1 tablet (1 mg total) by mouth 2 (two) times daily. 200 tablet 3  . traMADol (ULTRAM) 50 MG tablet Take 1 tablet (50 mg total) by mouth every 8 (eight) hours as needed. 30 tablet 0  . triamcinolone cream (KENALOG) 0.1 % Apply 1 application topically 2 (two) times daily. 30 g 0  . umeclidinium-vilanterol (ANORO ELLIPTA) 62.5-25 MCG/INH AEPB Inhale 1 puff into the lungs daily. 1 each 11   No current facility-administered medications on file prior to visit.     BP (!) 100/56   Temp 97.6  F (36.4 C) (Oral)   Ht 5\' 6"  (1.676 m)   Wt 154 lb 9.6 oz (70.1 kg)   BMI 24.95 kg/m       Objective:   Physical Exam  Constitutional: She is oriented to person, place, and time. She appears well-developed and well-nourished. No distress.  HENT:  Head: Normocephalic and atraumatic.  Right Ear: Hearing, tympanic membrane, external ear and ear canal normal. Tympanic membrane is not erythematous and not bulging.  Left Ear: Hearing, tympanic membrane, external ear and ear canal normal. Tympanic membrane is not erythematous and not bulging.  Nose: Mucosal edema present. Right sinus exhibits no maxillary sinus tenderness and no frontal sinus tenderness. Left sinus exhibits no frontal sinus tenderness.  Mouth/Throat: Uvula is midline, oropharynx is clear and moist and mucous membranes are normal. No oropharyngeal exudate, posterior oropharyngeal edema, posterior oropharyngeal erythema or tonsillar abscesses.  Eyes: Conjunctivae and EOM are normal. Pupils are equal, round, and reactive to light. Right eye exhibits no discharge. Left eye exhibits no discharge. No scleral icterus.  Neck: Normal range of motion. Neck supple. No thyromegaly present.  Cardiovascular: Normal rate, regular rhythm, normal heart sounds and intact distal pulses.  Exam reveals no gallop and no friction rub.   No murmur heard. Pulmonary/Chest: Effort normal and breath sounds normal. No respiratory distress. She has no wheezes. She has no rales. She exhibits no tenderness.  Lymphadenopathy:    She has no cervical adenopathy.  Neurological: She is alert and oriented to person, place, and time.  Skin: Skin is warm and dry. No rash noted. She is not diaphoretic. No erythema. No pallor.  Psychiatric: She has a normal mood and affect. Her behavior is normal. Judgment and thought content normal.  Nursing note and vitals reviewed.      Assessment & Plan:  1. Acute frontal sinusitis, recurrence not specified - No signs of  bacterial infection.  - Advised to continue to use Flonase  - Will give paper script that they can get filled if her symptoms become worse.  - doxycycline (VIBRAMYCIN) 100 MG capsule; Take 1 capsule (100 mg total) by mouth 2 (two) times daily.  Dispense: 14 capsule; Refill: 0  2. Essential hypertension - Well controlled on current medication  - amLODipine (NORVASC) 5 MG tablet; Take 1 tablet (5 mg total) by mouth daily.  Dispense: 90 tablet; Refill: 3  3. Hypothyroidism, unspecified type  - levothyroxine (SYNTHROID, LEVOTHROID) 50 MCG tablet; TAKE ONE (1) TABLET BY MOUTH EVERY DAY  Dispense: 90 tablet; Refill: 3  Dorothyann Peng, NP

## 2016-04-05 ENCOUNTER — Ambulatory Visit: Payer: Commercial Managed Care - HMO | Admitting: Family Medicine

## 2016-04-14 ENCOUNTER — Encounter: Payer: Self-pay | Admitting: Adult Health

## 2016-04-14 ENCOUNTER — Ambulatory Visit (INDEPENDENT_AMBULATORY_CARE_PROVIDER_SITE_OTHER): Payer: Medicare HMO | Admitting: Adult Health

## 2016-04-14 VITALS — BP 92/61 | HR 82 | Ht 64.25 in | Wt 154.1 lb

## 2016-04-14 DIAGNOSIS — M549 Dorsalgia, unspecified: Secondary | ICD-10-CM | POA: Diagnosis not present

## 2016-04-14 DIAGNOSIS — G458 Other transient cerebral ischemic attacks and related syndromes: Secondary | ICD-10-CM

## 2016-04-14 DIAGNOSIS — M238X1 Other internal derangements of right knee: Secondary | ICD-10-CM | POA: Diagnosis not present

## 2016-04-14 DIAGNOSIS — I10 Essential (primary) hypertension: Secondary | ICD-10-CM | POA: Diagnosis not present

## 2016-04-14 DIAGNOSIS — Z760 Encounter for issue of repeat prescription: Secondary | ICD-10-CM | POA: Diagnosis not present

## 2016-04-14 DIAGNOSIS — J449 Chronic obstructive pulmonary disease, unspecified: Secondary | ICD-10-CM

## 2016-04-14 MED ORDER — FUROSEMIDE 20 MG PO TABS: 20.0000 mg | ORAL_TABLET | Freq: Every day | ORAL | 3 refills | Status: DC

## 2016-04-14 MED ORDER — TRAMADOL HCL 50 MG PO TABS: 50.0000 mg | ORAL_TABLET | Freq: Three times a day (TID) | ORAL | 0 refills | Status: DC | PRN

## 2016-04-14 MED ORDER — CYCLOBENZAPRINE HCL 5 MG PO TABS: ORAL_TABLET | ORAL | 3 refills | Status: DC

## 2016-04-14 NOTE — Assessment & Plan Note (Signed)
Rx's provided.

## 2016-04-14 NOTE — Assessment & Plan Note (Signed)
Last episode > 10 years ago.

## 2016-04-14 NOTE — Progress Notes (Signed)
Subjective:    Patient ID: Donna Arias, female    DOB: 1927-01-03, 83 y.o.   MRN: [id number]  HPI:  Donna Arias presents to establish as a new pt.  She is a pleasant 83 year old female.  PMH:  HTN, COPD, TIA (last one was> 10 years ago, no deficits noted), Hypothyroidism, Cervical/thoracic pain (chronic > 20 years), GERD, and right knee laxity.  She is compliant on all medications and denies SE.  She ambulates around home without difficulty and wear right knee brace during ambulation and when sleeping to prevent sublaxation (last occurrence > 3years ago, denies referral to Orthopedic Specialist today).  She lives with adult daughter and  reports "feeling pretty good".  Daughter at [facility] during encounter.    Patient Care Team    Relationship Specialty Notifications Start End  Odella Aquas, NP PCP - General Family Medicine  04/14/16     Patient Active Problem List   Diagnosis Date Noted  . Medication refill 04/14/2016  . Joint laxity of right knee 04/14/2016  . Hypothyroidism 02/04/2016  . Vaginitis 07/19/2011  . Upper back pain 08/01/2009  . Transient cerebral ischemia 09/22/2007  . Dementia with behavioral disturbance 10/07/2006  . Essential hypertension 10/07/2006  . COPD (chronic obstructive pulmonary disease) ([place]) 10/07/2006  . GERD 10/07/2006     Past Medical History:  Diagnosis Date  . COPD (chronic obstructive pulmonary disease) ([place])   . Dementia   . Dementia   . GERD (gastroesophageal reflux disease)   . Heart attack 1998  . Hypertension   . Stroke ([place])   . Thyroid disease      Past Surgical History:  Procedure Laterality Date  . BREAST LUMPECTOMY  1995   Left Breast  . DILATION AND CURETTAGE OF UTERUS  1975  . GALLBLADDER SURGERY  1993     Family History  Problem Relation Age of Onset  . Hypertension Mother   . Stroke Mother      History  Drug Use No     History  Alcohol Use No     History  Smoking Status  . Former Smoker  Smokeless  Tobacco  . Never Used     Outpatient Encounter Prescriptions as of 04/14/2016  Medication Sig  . Acetaminophen (TYLENOL 8 HOUR PO) Take by mouth.  [name] amLODipine (NORVASC) 5 MG tablet Take 1 tablet (5 mg total) by mouth daily.  [name] aspirin 81 MG tablet Take 81 mg by mouth daily.  . cyclobenzaprine (FLEXERIL) 5 MG tablet One half tab daily at bedtime when necessary  . FLUoxetine (PROZAC) 10 MG capsule TAKE 1 CAPSULE BY MOUTH DAILY  . fluticasone (FLONASE) 50 MCG/ACT nasal spray Place 2 sprays into both nostrils daily.  . furosemide (LASIX) 20 MG tablet Take 1 tablet (20 mg total) by mouth daily.  [name] ketorolac (ACULAR) 0.4 % SOLN 1 drop 4 (four) times daily.  [name] levothyroxine (SYNTHROID, LEVOTHROID) 50 MCG tablet TAKE ONE (1) TABLET BY MOUTH EVERY DAY  . NON FORMULARY cloracidine  . potassium chloride SA (K-DUR,KLOR-CON) 20 MEQ tablet TAKE 1 TABLET BY MOUTH ONCE DAILY  . prednisoLONE acetate (PRED MILD) 0.12 % ophthalmic suspension 1 drop 4 (four) times daily.  . risperiDONE (RISPERDAL) 1 MG tablet Take 1 tablet (1 mg total) by mouth 2 (two) times daily.  . traMADol (ULTRAM) 50 MG tablet Take 1 tablet (50 mg total) by mouth every 8 (eight) hours as needed.  . triamcinolone cream (KENALOG) 0.1 % Apply 1  application topically 2 (two) times daily.  [name] umeclidinium-vilanterol (ANORO ELLIPTA) 62.5-25 MCG/INH AEPB Inhale 1 puff into the lungs daily.  . [DISCONTINUED] cyclobenzaprine (FLEXERIL) 5 MG tablet One half tab daily at bedtime when necessary  . [DISCONTINUED] furosemide (LASIX) 20 MG tablet Take 1.5 tablets (30 mg total) by mouth daily.  . [DISCONTINUED] traMADol (ULTRAM) 50 MG tablet Take 1 tablet (50 mg total) by mouth every 8 (eight) hours as needed.  [name] albuterol (PROVENTIL HFA;VENTOLIN HFA) 108 (90 Base) MCG/ACT inhaler Inhale 2 puffs into the lungs every 6 (six) hours as needed. (Patient not taking: Reported on 04/14/2016)  . albuterol (PROVENTIL) (2.5 MG/3ML) 0.083% nebulizer solution Take 3  mLs (2.5 mg total) by nebulization every 6 (six) hours as needed for wheezing or shortness of breath. (Patient not taking: Reported on 04/14/2016)  . Besifloxacin HCl (BESIVANCE) 0.6 % SUSP Apply to eye.  . bromfenac (XIBROM) 0.09 % ophthalmic solution 1 drop 2 (two) times daily.  . NON FORMULARY Ear pain drops  . [DISCONTINUED] doxycycline (VIBRAMYCIN) 100 MG capsule Take 1 capsule (100 mg total) by mouth 2 (two) times daily.  . [DISCONTINUED] nystatin-triamcinolone ointment (MYCOLOG) APPLY TOPICALLY TWICE DAILY   No facility-administered encounter medications on file as of 04/14/2016.     Allergies: Codeine sulfate  Body mass index is 26.25 kg/m.  Blood pressure 92/61, pulse 82, height 5' 4.25" (1.632 m), weight 154 lb 1.9 oz (69.9 kg), SpO2 98 %.     Review of Systems  Constitutional: Negative for activity change, appetite change, chills, diaphoresis, fatigue, fever and unexpected weight change.  HENT: Negative for congestion.   Eyes: Negative for visual disturbance.  Respiratory: Negative for cough, chest tightness, shortness of breath, wheezing and stridor.   Cardiovascular: Negative for chest pain, palpitations and leg swelling.  Gastrointestinal: Positive for constipation. Negative for abdominal distention, abdominal pain, blood in stool, diarrhea, nausea and vomiting.  Endocrine: Negative for cold intolerance, heat intolerance, polydipsia, polyphagia and polyuria.  Genitourinary: Negative for difficulty urinating, flank pain and hematuria.  Musculoskeletal: Positive for arthralgias, back pain, gait problem, joint swelling, myalgias, neck pain and neck stiffness.  Skin: Negative for color change, pallor, rash and wound.  Neurological: Negative for dizziness, tremors, syncope, speech difficulty, weakness, light-headedness and headaches.  Psychiatric/Behavioral: Negative for agitation, behavioral problems, confusion and sleep disturbance. The patient is not nervous/anxious.         Objective:   Physical Exam  Constitutional: She is oriented to person, place, and time. She appears well-developed and well-nourished. No distress.  HENT:  Head: Normocephalic and atraumatic.  Right Ear: Tympanic membrane, external ear and ear canal normal. Decreased hearing is noted.  Left Ear: Tympanic membrane, external ear and ear canal normal. Decreased hearing is noted.  Nose: Right sinus exhibits no maxillary sinus tenderness and no frontal sinus tenderness. Left sinus exhibits no maxillary sinus tenderness and no frontal sinus tenderness.  Mouth/Throat: Uvula is midline.  Eyes: Conjunctivae are normal. Pupils are equal, round, and reactive to light.  Neck: Normal range of motion.  Cardiovascular: Normal rate, regular rhythm, normal heart sounds and intact distal pulses.   No murmur heard. Pulmonary/Chest: Effort normal and breath sounds normal. No respiratory distress. She has no wheezes. She has no rales. She exhibits no tenderness.  Abdominal: Soft. Bowel sounds are normal. She exhibits no distension and no mass. There is no tenderness. There is no rebound and no guarding.  Musculoskeletal: She exhibits edema and tenderness.       Right  knee: She exhibits decreased range of motion and swelling. No tenderness found.       Cervical back: She exhibits tenderness. She exhibits no swelling, no edema and no spasm.       Thoracic back: She exhibits tenderness.       Lumbar back: She exhibits tenderness and spasm.  Lymphadenopathy:    She has no cervical adenopathy.  Neurological: She is alert and oriented to person, place, and time.  Skin: Skin is warm and dry. No rash noted. She is not diaphoretic. No erythema. There is pallor.  Very thin skin, no open tissue noted at encounter today.  Psychiatric: She has a normal mood and affect. Her behavior is normal. Judgment and thought content normal.          Assessment & Plan:   1. Essential hypertension   2. Medication  refill   3. Upper back pain   4. Other specified transient cerebral ischemias   5. Chronic obstructive pulmonary disease, unspecified COPD type ([place])   6. Joint laxity of right knee     Essential hypertension Continue on amlodipine daily.  Daughter administers either a 1/2 or full tablet daily. Pt's BP is normally SBP 90-110 DBP 60-80s.   Transient cerebral ischemia Last episode > 10 years ago.   COPD (chronic obstructive pulmonary disease) ([facility]) Continue umeclidinium-vilanterol daily. Last tobacco use > 30 years ago.  Upper back pain Stretch daily. Continue regular movement as tolerated/safe. Tramadol and Cyclobenzaprine as needed.  Medication refill Rx's provided.  Joint laxity of right knee Continue consistent use of right knee brace, take off when relaxing in chair. If laxity worsens will refer to Orthopedic Specialist-declined today.     FOLLOW-UP:  Return in about 6 months (around 10/15/2016) for Regular Follow Up, Lab Work.

## 2016-04-14 NOTE — Assessment & Plan Note (Signed)
Stretch daily. Continue regular movement as tolerated/safe. Tramadol and Cyclobenzaprine as needed.

## 2016-04-14 NOTE — Assessment & Plan Note (Signed)
Continue on amlodipine daily.  Daughter administers either a 1/2 or full tablet daily. Pt's BP is normally SBP 90-110 DBP 60-80s.

## 2016-04-14 NOTE — Assessment & Plan Note (Signed)
Continue consistent use of right knee brace, take off when relaxing in chair. If laxity worsens will refer to Orthopedic Specialist-declined today.

## 2016-04-14 NOTE — Assessment & Plan Note (Signed)
Continue umeclidinium-vilanterol daily. Last tobacco use > 30 years ago.

## 2016-04-14 NOTE — Patient Instructions (Signed)
Heart-Healthy Eating Plan Many factors influence your heart health, including eating and exercise habits. Heart (coronary) risk increases with abnormal blood fat (lipid) levels. Heart-healthy meal planning includes limiting unhealthy fats, increasing healthy fats, and making other small dietary changes. This includes maintaining a healthy body weight to help keep lipid levels within a normal range. What is my plan? Your health care provider recommends that you:  Get no more than _________% of the total calories in your daily diet from fat.  Limit your intake of saturated fat to less than _________% of your total calories each day.  Limit the amount of cholesterol in your diet to less than _________ mg per day. What types of fat should I choose?  Choose healthy fats more often. Choose monounsaturated and polyunsaturated fats, such as olive oil and canola oil, flaxseeds, walnuts, almonds, and seeds.  Eat more omega-3 fats. Good choices include salmon, mackerel, sardines, tuna, flaxseed oil, and ground flaxseeds. Aim to eat fish at least two times each week.  Limit saturated fats. Saturated fats are primarily found in animal products, such as meats, butter, and cream. Plant sources of saturated fats include palm oil, palm kernel oil, and coconut oil.  Avoid foods with partially hydrogenated oils in them. These contain trans fats. Examples of foods that contain trans fats are stick margarine, some tub margarines, cookies, crackers, and other baked goods. What general guidelines do I need to follow?  Check food labels carefully to identify foods with trans fats or high amounts of saturated fat.  Fill one half of your plate with vegetables and green salads. Eat 4-5 servings of vegetables per day. A serving of vegetables equals 1 cup of raw leafy vegetables,  cup of raw or cooked cut-up vegetables, or  cup of vegetable juice.  Fill one fourth of your plate with whole grains. Look for the word  "whole" as the first word in the ingredient list.  Fill one fourth of your plate with lean protein foods.  Eat 4-5 servings of fruit per day. A serving of fruit equals one medium whole fruit,  cup of dried fruit,  cup of fresh, frozen, or canned fruit, or  cup of 100% fruit juice.  Eat more foods that contain soluble fiber. Examples of foods that contain this type of fiber are apples, broccoli, carrots, beans, peas, and barley. Aim to get 20-30 g of fiber per day.  Eat more home-cooked food and less restaurant, buffet, and fast food.  Limit or avoid alcohol.  Limit foods that are high in starch and sugar.  Avoid fried foods.  Cook foods by using methods other than frying. Baking, boiling, grilling, and broiling are all great options. Other fat-reducing suggestions include:  Removing the skin from poultry.  Removing all visible fats from meats.  Skimming the fat off of stews, soups, and gravies before serving them.  Steaming vegetables in water or broth.  Lose weight if you are overweight. Losing just 5-10% of your initial body weight can help your overall health and prevent diseases such as diabetes and heart disease.  Increase your consumption of nuts, legumes, and seeds to 4-5 servings per week. One serving of dried beans or legumes equals  cup after being cooked, one serving of nuts equals 1 ounces, and one serving of seeds equals  ounce or 1 tablespoon.  You may need to monitor your salt (sodium) intake, especially if you have high blood pressure. Talk with your health care provider or dietitian to get more  information about reducing sodium. What foods can I eat? Grains   Breads, including [place], white, pita, wheat, raisin, rye, oatmeal, and [place]. Tortillas that are neither fried nor made with lard or trans fat. Low-fat rolls, including hotdog and hamburger buns and English muffins. Biscuits. Muffins. Waffles. Pancakes. Light popcorn. Whole-grain cereals. Flatbread.  Melba toast. Pretzels. Breadsticks. Rusks. Low-fat snacks and crackers, including oyster, saltine, matzo, graham, animal, and rye. Rice and pasta, including [place] rice and those that are made with whole wheat. Vegetables  All vegetables. Fruits  All fruits, but limit coconut. Meats and Other Protein Sources  Lean, well-trimmed beef, veal, pork, and lamb. Chicken and [place] without skin. All fish and shellfish. Wild duck, rabbit, pheasant, and venison. Egg whites or low-cholesterol egg substitutes. Dried beans, peas, lentils, and tofu.Seeds and most nuts. Dairy  Low-fat or nonfat cheeses, including ricotta, string, and mozzarella. Skim or 1% milk that is liquid, powdered, or evaporated. Buttermilk that is made with low-fat milk. Nonfat or low-fat yogurt. Beverages  Mineral water. Diet carbonated beverages. Sweets and Desserts  Sherbets and fruit ices. Honey, jam, marmalade, jelly, and syrups. Meringues and gelatins. Pure sugar candy, such as hard candy, jelly beans, gumdrops, mints, marshmallows, and small amounts of dark chocolate. [employer] Eat all sweets and desserts in moderation. Fats and Oils  Nonhydrogenated (trans-free) margarines. Vegetable oils, including soybean, sesame, sunflower, olive, peanut, safflower, corn, canola, and cottonseed. Salad dressings or mayonnaise that are made with a vegetable oil. Limit added fats and oils that you use for cooking, baking, salads, and as spreads. Other  Cocoa powder. Coffee and tea. All seasonings and condiments. The items listed above may not be a complete list of recommended foods or beverages. Contact your dietitian for more options.  What foods are not recommended? Grains  Breads that are made with saturated or trans fats, oils, or whole milk. Croissants. Butter rolls. Cheese breads. Sweet rolls. Donuts. Buttered popcorn. Chow mein noodles. High-fat crackers, such as cheese or butter crackers. Meats and Other Protein Sources  Fatty  meats, such as hotdogs, short ribs, sausage, spareribs, bacon, ribeye roast or steak, and mutton. High-fat deli meats, such as salami and bologna. Caviar. Domestic duck and goose. Organ meats, such as kidney, liver, sweetbreads, brains, gizzard, chitterlings, and heart. Dairy  Cream, sour cream, cream cheese, and creamed cottage cheese. Whole milk cheeses, including blue (bleu), Monte[place] Jack, [place], [place], American, [place], Swiss, [place], [place], and [place]. Whole or 2% milk that is liquid, evaporated, or condensed. Whole buttermilk. Cream sauce or high-fat cheese sauce. Yogurt that is made from whole milk. Beverages  Regular sodas and drinks with added sugar. Sweets and Desserts  Frosting. Pudding. Cookies. Cakes other than angel food cake. Candy that has milk chocolate or white chocolate, hydrogenated fat, butter, coconut, or unknown ingredients. Buttered syrups. Full-fat ice cream or ice cream drinks. Fats and Oils  Gravy that has suet, meat fat, or shortening. Cocoa butter, hydrogenated oils, palm oil, coconut oil, palm kernel oil. These can often be found in baked products, candy, fried foods, nondairy creamers, and whipped toppings. Solid fats and shortenings, including bacon fat, salt pork, lard, and butter. Nondairy cream substitutes, such as coffee creamers and sour cream substitutes. Salad dressings that are made of unknown oils, cheese, or sour cream. The items listed above may not be a complete list of foods and beverages to avoid. Contact your dietitian for more information.  This information is not intended to replace advice given to you by  your health care provider. Make sure you discuss any questions you have with your health care provider. Document Released: 10/21/2007 Document Revised: 08/01/2015 Document Reviewed: 07/05/2013 Elsevier Interactive Patient Education  2017 [place].   Hypertension Hypertension, commonly called high blood pressure, is when the force of blood  pumping through the arteries is too strong. The arteries are the blood vessels that carry blood from the heart throughout the body. Hypertension forces the heart to work harder to pump blood and may cause arteries to become narrow or stiff. Having untreated or uncontrolled hypertension can cause heart attacks, strokes, kidney disease, and other problems. A blood pressure reading consists of a higher number over a lower number. Ideally, your blood pressure should be below 120/80. The first ("top") number is called the systolic pressure. It is a measure of the pressure in your arteries as your heart beats. The second ("bottom") number is called the diastolic pressure. It is a measure of the pressure in your arteries as the heart relaxes. What are the causes? The cause of this condition is not known. What increases the risk? Some risk factors for high blood pressure are under your control. Others are not. Factors you can change   Smoking.  Having type 2 diabetes mellitus, high cholesterol, or both.  Not getting enough exercise or physical activity.  Being overweight.  Having too much fat, sugar, calories, or salt (sodium) in your diet.  Drinking too much alcohol. Factors that are difficult or impossible to change   Having chronic kidney disease.  Having a family history of high blood pressure.  Age. Risk increases with age.  Race. You may be at higher risk if you are African-American.  Gender. Men are at higher risk than women before age 34. After age 11, women are at higher risk than men.  Having obstructive sleep apnea.  Stress. What are the signs or symptoms? Extremely high blood pressure (hypertensive crisis) may cause:  Headache.  Anxiety.  Shortness of breath.  Nosebleed.  Nausea and vomiting.  Severe chest pain.  Jerky movements you cannot control (seizures). How is this diagnosed? This condition is diagnosed by measuring your blood pressure while you are  seated, with your arm resting on a surface. The cuff of the blood pressure monitor will be placed directly against the skin of your upper arm at the level of your heart. It should be measured at least twice using the same arm. Certain conditions can cause a difference in blood pressure between your right and left arms. Certain factors can cause blood pressure readings to be lower or higher than normal (elevated) for a short period of time:  When your blood pressure is higher when you are in a health care provider's office than when you are at home, this is called white coat hypertension. Most people with this condition do not need medicines.  When your blood pressure is higher at home than when you are in a health care provider's office, this is called masked hypertension. Most people with this condition may need medicines to control blood pressure. If you have a high blood pressure reading during one visit or you have normal blood pressure with other risk factors:  You may be asked to return on a different day to have your blood pressure checked again.  You may be asked to monitor your blood pressure at home for 1 week or longer. If you are diagnosed with hypertension, you may have other blood or imaging tests to help your  health care provider understand your overall risk for other conditions. How is this treated? This condition is treated by making healthy lifestyle changes, such as eating healthy foods, exercising more, and reducing your alcohol intake. Your health care provider may prescribe medicine if lifestyle changes are not enough to get your blood pressure under control, and if:  Your systolic blood pressure is above 130.  Your diastolic blood pressure is above 80. Your personal target blood pressure may vary depending on your medical conditions, your age, and other factors. Follow these instructions at home: Eating and drinking   Eat a diet that is high in fiber and potassium, and  low in sodium, added sugar, and fat. An example eating plan is called the DASH (Dietary Approaches to Stop Hypertension) diet. To eat this way:  Eat plenty of fresh fruits and vegetables. Try to fill half of your plate at each meal with fruits and vegetables.  Eat whole grains, such as whole wheat pasta, brown rice, or whole grain bread. Fill about one quarter of your plate with whole grains.  Eat or drink low-fat dairy products, such as skim milk or low-fat yogurt.  Avoid fatty cuts of meat, processed or cured meats, and poultry with skin. Fill about one quarter of your plate with lean proteins, such as fish, chicken without skin, beans, eggs, and tofu.  Avoid premade and processed foods. These tend to be higher in sodium, added sugar, and fat.  Reduce your daily sodium intake. Most people with hypertension should eat less than 1,500 mg of sodium a day.  Limit alcohol intake to no more than 1 drink a day for nonpregnant women and 2 drinks a day for men. One drink equals 12 oz of beer, 5 oz of wine, or 1 oz of hard liquor. Lifestyle   Work with your health care provider to maintain a healthy body weight or to lose weight. Ask what an ideal weight is for you.  Get at least 30 minutes of exercise that causes your heart to beat faster (aerobic exercise) most days of the week. Activities may include walking, swimming, or biking.  Include exercise to strengthen your muscles (resistance exercise), such as pilates or lifting weights, as part of your weekly exercise routine. Try to do these types of exercises for 30 minutes at least 3 days a week.  Do not use any products that contain nicotine or tobacco, such as cigarettes and e-cigarettes. If you need help quitting, ask your health care provider.  Monitor your blood pressure at home as told by your health care provider.  Keep all follow-up visits as told by your health care provider. This is important. Medicines   Take over-the-counter and  prescription medicines only as told by your health care provider. Follow directions carefully. Blood pressure medicines must be taken as prescribed.  Do not skip doses of blood pressure medicine. Doing this puts you at risk for problems and can make the medicine less effective.  Ask your health care provider about side effects or reactions to medicines that you should watch for. Contact a health care provider if:  You think you are having a reaction to a medicine you are taking.  You have headaches that keep coming back (recurring).  You feel dizzy.  You have swelling in your ankles.  You have trouble with your vision. Get help right away if:  You develop a severe headache or confusion.  You have unusual weakness or numbness.  You feel faint.  You  have severe pain in your chest or abdomen.  You vomit repeatedly.  You have trouble breathing. Summary  Hypertension is when the force of blood pumping through your arteries is too strong. If this condition is not controlled, it may put you at risk for serious complications.  Your personal target blood pressure may vary depending on your medical conditions, your age, and other factors. For most people, a normal blood pressure is less than 120/80.  Hypertension is treated with lifestyle changes, medicines, or a combination of both. Lifestyle changes include weight loss, eating a healthy, low-sodium diet, exercising more, and limiting alcohol. This information is not intended to replace advice given to you by your health care provider. Make sure you discuss any questions you have with your health care provider. Document Released: 01/11/2005 Document Revised: 12/10/2015 Document Reviewed: 12/10/2015 Elsevier Interactive Patient Education  2017 [place] all medications as directed.  Continue healthy eating and regular movement as tolerated/safe. Please return July 2018 for a full physical and fasting labs, sooner if  needed.

## 2016-04-19 ENCOUNTER — Encounter: Payer: Self-pay | Admitting: Adult Health

## 2016-04-19 ENCOUNTER — Ambulatory Visit (INDEPENDENT_AMBULATORY_CARE_PROVIDER_SITE_OTHER): Payer: Medicare HMO | Admitting: Adult Health

## 2016-04-19 VITALS — BP 162/88 | HR 70 | Ht 64.25 in | Wt 156.2 lb

## 2016-04-19 DIAGNOSIS — M545 Low back pain: Secondary | ICD-10-CM

## 2016-04-19 DIAGNOSIS — M542 Cervicalgia: Secondary | ICD-10-CM | POA: Diagnosis not present

## 2016-04-19 DIAGNOSIS — G8929 Other chronic pain: Secondary | ICD-10-CM | POA: Insufficient documentation

## 2016-04-19 NOTE — Progress Notes (Signed)
Subjective:    Patient ID: Donna Arias, female    DOB: [birth year], 83 y.o.   MRN: [id number]  HPI:  [name] presents with low back pain 2/10 that developed sat am when she was getting out bed.  She denies bowel/bladder dysfunction or numbness/tingling in lower extremities.  Her daughter administered Cyclobenzaprine 5mg  sat am/pm that provided pain/muscle spasm relief.  She has not taken any other medication for pain/muscle spasm since then. She reports sitting helps alleviate pain and standing/walking aggravates pain.  She has long-standing hx chronic neck/low back pain.    Patient Care Team    Relationship Specialty Notifications Start End  Odella Aquas, NP PCP - General Family Medicine  04/14/16     Patient Active Problem List   Diagnosis Date Noted  . Chronic bilateral low back pain 04/19/2016  . Chronic neck pain 04/19/2016  . Medication refill 04/14/2016  . Joint laxity of right knee 04/14/2016  . Hypothyroidism 02/04/2016  . Vaginitis 07/19/2011  . Upper back pain 08/01/2009  . Transient cerebral ischemia 09/22/2007  . Dementia with behavioral disturbance 10/07/2006  . Essential hypertension 10/07/2006  . COPD (chronic obstructive pulmonary disease) ([place]) 10/07/2006  . GERD 10/07/2006     Past Medical History:  Diagnosis Date  . COPD (chronic obstructive pulmonary disease) ([place])   . Dementia   . Dementia   . GERD (gastroesophageal reflux disease)   . Heart attack 1998  . Hypertension   . Stroke ([place])   . Thyroid disease      Past Surgical History:  Procedure Laterality Date  . BREAST LUMPECTOMY  1995   Left Breast  . DILATION AND CURETTAGE OF UTERUS  1975  . GALLBLADDER SURGERY  1993     Family History  Problem Relation Age of Onset  . Hypertension Mother   . Stroke Mother      History  Drug Use No     History  Alcohol Use No     History  Smoking Status  . Former Smoker  Smokeless Tobacco  . Never Used     Outpatient Encounter  Prescriptions as of 04/19/2016  Medication Sig  . Acetaminophen (TYLENOL 8 HOUR PO) Take by mouth.  [name] albuterol (PROVENTIL HFA;VENTOLIN HFA) 108 (90 Base) MCG/ACT inhaler Inhale 2 puffs into the lungs every 6 (six) hours as needed.  [name] albuterol (PROVENTIL) (2.5 MG/3ML) 0.083% nebulizer solution Take 3 mLs (2.5 mg total) by nebulization every 6 (six) hours as needed for wheezing or shortness of breath.  [name] amLODipine (NORVASC) 5 MG tablet Take 1 tablet (5 mg total) by mouth daily.  [name] aspirin 81 MG tablet Take 81 mg by mouth daily.  [name] Besifloxacin HCl (BESIVANCE) 0.6 % SUSP Apply to eye.  . bromfenac (XIBROM) 0.09 % ophthalmic solution 1 drop 2 (two) times daily.  . cyclobenzaprine (FLEXERIL) 5 MG tablet One half tab daily at bedtime when necessary  . FLUoxetine (PROZAC) 10 MG capsule TAKE 1 CAPSULE BY MOUTH DAILY  . fluticasone (FLONASE) 50 MCG/ACT nasal spray Place 2 sprays into both nostrils daily.  . furosemide (LASIX) 20 MG tablet Take 1 tablet (20 mg total) by mouth daily.  [name] ketorolac (ACULAR) 0.4 % SOLN 1 drop 4 (four) times daily.  [name] levothyroxine (SYNTHROID, LEVOTHROID) 50 MCG tablet TAKE ONE (1) TABLET BY MOUTH EVERY DAY  . NON FORMULARY Ear pain drops  . NON FORMULARY cloracidine  . potassium chloride SA (K-DUR,KLOR-CON) 20 MEQ tablet TAKE 1 TABLET BY MOUTH  ONCE DAILY  . prednisoLONE acetate (PRED MILD) 0.12 % ophthalmic suspension 1 drop 4 (four) times daily.  . risperiDONE (RISPERDAL) 1 MG tablet Take 1 tablet (1 mg total) by mouth 2 (two) times daily.  . traMADol (ULTRAM) 50 MG tablet Take 1 tablet (50 mg total) by mouth every 8 (eight) hours as needed.  . triamcinolone cream (KENALOG) 0.1 % Apply 1 application topically 2 (two) times daily.  [name] umeclidinium-vilanterol (ANORO ELLIPTA) 62.5-25 MCG/INH AEPB Inhale 1 puff into the lungs daily.   No facility-administered encounter medications on file as of 04/19/2016.     Allergies: Codeine sulfate  Body mass index is 26.6  kg/m.  Blood pressure (!) 162/88, pulse 70, height 5' 4.25" (1.632 m), weight 156 lb 3.2 oz (70.9 kg).     Review of Systems  Constitutional: Positive for fatigue. Negative for activity change, appetite change, chills, diaphoresis, fever and unexpected weight change.  Respiratory: Negative for cough, chest tightness, shortness of breath, wheezing and stridor.   Cardiovascular: Negative for chest pain, palpitations and leg swelling.  Gastrointestinal: Negative for abdominal distention, abdominal pain, blood in stool, constipation, diarrhea, nausea and vomiting.  Endocrine: Negative for cold intolerance, heat intolerance, polydipsia, polyphagia and polyuria.  Genitourinary: Negative for difficulty urinating and hematuria.  Musculoskeletal: Positive for arthralgias, back pain, gait problem, joint swelling, myalgias, neck pain and neck stiffness.  Skin: Negative for color change, pallor, rash and wound.  Neurological: Negative for dizziness, tremors, weakness, light-headedness and numbness.       Objective:   Physical Exam  Constitutional: She is oriented to person, place, and time. She appears well-developed and well-nourished. No distress.  Abdominal: Soft. Bowel sounds are normal.  Musculoskeletal: She exhibits edema and tenderness.       Cervical back: She exhibits tenderness. She exhibits no pain and no spasm.       Lumbar back: She exhibits decreased range of motion, tenderness and spasm. She exhibits no bony tenderness, no swelling, no edema and no pain.  Neurological: She is alert and oriented to person, place, and time.  Skin: Skin is warm and dry. No rash noted. She is not diaphoretic. No erythema. No pallor.  Psychiatric: She has a normal mood and affect. Her behavior is normal. Judgment and thought content normal.  Nursing note and vitals reviewed.         Assessment & Plan:   1. Chronic bilateral low back pain, with sciatica presence unspecified   2. Chronic neck  pain     Chronic bilateral low back pain Use Cyclobenzaprine 5mg  tonight and tomorrow night. Seated stretching exercises. Use heating pad for 13mins several times daily-be careful not to burn yourself. OTC Acetaminophen or Ibuprofen per manufacturer's instructions.   PT referral placed. Please call clinic wed to update [name] on your condition.    FOLLOW-UP:  Return if symptoms worsen or fail to improve.

## 2016-04-19 NOTE — Patient Instructions (Signed)
Back Pain, Adult Back pain is very common. The pain often gets better over time. The cause of back pain is usually not dangerous. Most people can learn to manage their back pain on their own. Follow these instructions at home: Watch your back pain for any changes. The following actions may help to lessen any pain you are feeling:  Stay active. Start with short walks on flat ground if you can. Try to walk farther each day.  Exercise regularly as told by your doctor. Exercise helps your back heal faster. It also helps avoid future injury by keeping your muscles strong and flexible.  Do not sit, drive, or stand in one place for more than 30 minutes.  Do not stay in bed. Resting more than 1-2 days can slow down your recovery.  Be careful when you bend or lift an object. Use good form when lifting:  Bend at your knees.  Keep the object close to your body.  Do not twist.  Sleep on a firm mattress. Lie on your side, and bend your knees. If you lie on your back, put a pillow under your knees.  Take medicines only as told by your doctor.  Put ice on the injured area.  Put ice in a plastic bag.  Place a towel between your skin and the bag.  Leave the ice on for 20 minutes, 2-3 times a day for the first 2-3 days. After that, you can switch between ice and heat packs.  Avoid feeling anxious or stressed. Find good ways to deal with stress, such as exercise.  Maintain a healthy weight. Extra weight puts stress on your back. Contact a doctor if:  You have pain that does not go away with rest or medicine.  You have worsening pain that goes down into your legs or buttocks.  You have pain that does not get better in one week.  You have pain at night.  You lose weight.  You have a fever or chills. Get help right away if:  You cannot control when you poop (bowel movement) or pee (urinate).  Your arms or legs feel weak.  Your arms or legs lose feeling (numbness).  You feel sick to  your stomach (nauseous) or throw up (vomit).  You have belly (abdominal) pain.  You feel like you may pass out (faint). This information is not intended to replace advice given to you by your health care provider. Make sure you discuss any questions you have with your health care provider. Document Released: 06/30/2007 Document Revised: 06/19/2015 Document Reviewed: 05/15/2013 Elsevier Interactive Patient Education  2017 [employer].  Use Cyclobenzaprine 5mg  tonight and tomorrow night. Seated stretching exercises. Use heating pad for 94mins several times daily-be careful not to burn yourself. OTC Acetaminophen or Ibuprofen per manufacturer's instructions.   PT referral placed. Please call clinic wed to update [name] on your condition.

## 2016-04-19 NOTE — Assessment & Plan Note (Signed)
Use Cyclobenzaprine 5mg  tonight and tomorrow night. Seated stretching exercises. Use heating pad for 1mins several times daily-be careful not to burn yourself. OTC Acetaminophen or Ibuprofen per manufacturer's instructions.   PT referral placed. Please call clinic wed to update [name] on your condition.

## 2016-05-03 ENCOUNTER — Ambulatory Visit: Payer: Medicare HMO | Attending: Adult Health | Admitting: Physical Therapy

## 2016-05-03 ENCOUNTER — Encounter: Payer: Self-pay | Admitting: Physical Therapy

## 2016-05-03 DIAGNOSIS — M545 Low back pain, unspecified: Secondary | ICD-10-CM

## 2016-05-03 DIAGNOSIS — G8929 Other chronic pain: Secondary | ICD-10-CM | POA: Insufficient documentation

## 2016-05-03 DIAGNOSIS — R293 Abnormal posture: Secondary | ICD-10-CM | POA: Insufficient documentation

## 2016-05-03 DIAGNOSIS — M542 Cervicalgia: Secondary | ICD-10-CM | POA: Insufficient documentation

## 2016-05-03 DIAGNOSIS — M6281 Muscle weakness (generalized): Secondary | ICD-10-CM

## 2016-05-03 NOTE — Patient Instructions (Addendum)
Lower Trunk Rotation Stretch    Keeping back flat and feet together, rotate knees to left side. Hold __10-15__ seconds. Repeat __10__ times per set. Do ___1-2_ sets per session. Do __2_ sessions per day.  http://orth.exer.us/123   Copyright  VHI. All rights reserved.   Knee to Chest (Flexion)    Pull knee toward chest. Feel stretch in lower back or buttock area. Breathing deeply, Hold __30__ seconds. Repeat with other knee. Repeat __3__ times. Do ___2_ sessions per day.  http://gt2.exer.us/226   Copyright  VHI. All rights reserved.   Tips on Posture for exercise    Some exercises require proper starting position: Weight distributed evenly on both sides of body whether standing or sitting. (symmetrical) Head is held in straight position and in center of shoulders, chin is tucked. (centered) Back is erect, shoulders level. Both feet are flat on ground, slightly apart, heels down. Do not slouch or jut chin forward.  Copyright  VHI. All rights reserved.  AROM: Neck Rotation    Turn head slowly to look over one shoulder, then the other. Hold each position _20-30___ seconds. Repeat __3__ times per set. Do ___1_ sets per session. Do ___2_ sessions per day.  http://orth.exer.us/295   Copyright  VHI. All rights reserved.   Scapular Retraction (Standing)    With arms at sides, pinch shoulder blades together. Repeat __10__ times per set. Do __1__ sets per session. Do ___2_ sessions per day.  http://orth.exer.us/945   Copyright  VHI. All rights reserved.

## 2016-05-04 NOTE — Therapy (Addendum)
[place], [place], [address] Phone: [phone]   Fax:  [phone]  Physical Therapy Evaluation and Discharge Addendum  Patient Details  Name: Donna Arias MRN: [id number] Date of Birth: Jun 15, 1926 Referring Provider: Dr. Arvil Persons  Encounter Date: 05/03/2016      PT End of Session - 05/03/16 1429    Visit Number 1   Number of Visits 16   Date for PT Re-Evaluation 06/28/16   PT Start Time 6962   PT Stop Time 1435   PT Time Calculation (min) 62 min   Activity Tolerance Patient tolerated treatment well   Behavior During Therapy [facility] for tasks assessed/performed      Past Medical History:  Diagnosis Date  . COPD (chronic obstructive pulmonary disease) ([place])   . Dementia   . Dementia   . GERD (gastroesophageal reflux disease)   . Heart attack 1998  . Hypertension   . Stroke ([place])   . Thyroid disease     Past Surgical History:  Procedure Laterality Date  . BREAST LUMPECTOMY  1995   Left Breast  . DILATION AND CURETTAGE OF UTERUS  1975  . GALLBLADDER SURGERY  1993    There were no vitals filed for this visit.       Subjective Assessment - 05/03/16 1340    Subjective Pt with chronic pain in neck and back, >15 yrs. Pt has difficulty with home tasks due to pain in her neck, shoulder and sometimes low back.  She is sedentary, lives with her daughter and her family since her husband passed 3 yrs ago.      Patient is accompained by: Family member  daughter    Pertinent History dementia, chronic pain,    Limitations Lifting;Standing;Walking;House hold activities   How long can you sit comfortably? not limited    How long can you stand comfortably? only minutes    How long can you walk comfortably? < 10 min uses buggy in the groery store, legs feel like they'll give out.    Diagnostic tests none recent    Patient Stated Goals to be able to walk through the grocery store , less pain    Currently in  Pain? No/denies   Pain Score 0-No pain   Pain Location Neck   Pain Orientation Right;Left;Posterior   Pain Descriptors / Indicators Tightness;Spasm   Pain Type Chronic pain   Pain Frequency Intermittent   Aggravating Factors  activity make her have spasms   Pain Relieving Factors rest, medicine, heating pad    Effect of Pain on Daily Activities limits activity    Multiple Pain Sites Yes   Pain Score 0   Pain Location Back   Pain Orientation Right;Left   Pain Descriptors / Indicators Aching;Sore   Pain Type Chronic pain   Pain Onset More than a month ago   Pain Frequency Intermittent   Aggravating Factors  activity, standing, extension, hanging up clothes   Pain Relieving Factors sitting, laying down    Effect of Pain on Daily Activities limits mobility             [facility] PT Assessment - 05/03/16 1353      Assessment   Medical Diagnosis cervical and lumbar pain    Referring Provider Dr. Arvil Persons   Onset Date/Surgical Date --  chronic    Prior Therapy No      Precautions   Precautions Fall;Other (comment)   Precaution Comments close S    Required  Braces or Orthoses Other Brace/Splint   Other Brace/Splint Rt. knee brace due to instability      Restrictions   Weight Bearing Restrictions No     Balance Screen   Has the patient fallen in the past 6 months No   Has the patient had a decrease in activity level because of a fear of falling?  Yes   Is the patient reluctant to leave their home because of a fear of falling?  No     Home Environment   Living Environment Private residence   Living Arrangements Children;Non-relatives/Friends   Available Help at Discharge Family   Type of [place] with household mobility without device;Needs assistance with ADLs;Needs assistance with homemaking   Vocation Unemployed     Cognition   Overall Cognitive Status History of cognitive impairments - at baseline      Observation/Other Assessments   Focus on Therapeutic Outcomes (FOTO)  64%     Sensation   Light Touch Appears Intact     Posture/Postural Control   Posture/Postural Control Postural limitations   Postural Limitations Rounded Shoulders;Forward head;Increased thoracic kyphosis;Flexed trunk   Posture Comments pain with ext     AROM   Cervical Flexion 55   Cervical Extension <10   Cervical - Right Side Bend min motion   Cervical - Left Side Bend min motion, pain L    Cervical - Right Rotation <20    Cervical - Left Rotation <20 deg    Lumbar Flexion in sitting    Lumbar Extension unable to get to neutral    Lumbar - Right Side Bend 75%   Lumbar - Left Side Bend 75%   Lumbar - Right Rotation 50%   Lumbar - Left Rotation 50%      PROM   Overall PROM Comments unable to rotate cervical spine beyond 30 deg in each direction, pain on L to the L.  Difficulty relaxing head.  Head forward and fixed into flexion , unable to sidebend beyond 20-30 deg.      Strength   Right Shoulder Flexion 3+/5   Left Shoulder Flexion 3+/5   Right Hip Flexion 3+/5   Left Hip Flexion 4-/5   Right Knee Flexion 4/5   Right Knee Extension 4/5   Left Knee Flexion 4+/5   Left Knee Extension 4+/5     Palpation   Palpation comment palpation felt good to patient in lumbar, cervical spine including upper traps, post cervicals and pectoralis major       Bed Mobility   Rolling Right 5: Supervision   Rolling Left 5: Supervision   Left Sidelying to Sit 4: Min guard   Sit to Supine 5: Supervision     Ambulation/Gait   Ambulation/Gait Yes   Ambulation/Gait Assistance 4: Min guard  HHA   Ambulation Distance (Feet) 200 Feet   Assistive device None   Gait Pattern Decreased hip/knee flexion - right;Decreased hip/knee flexion - left;Antalgic   Gait Comments Rt knee flexed and valgus                   [facility] Adult PT Treatment/Exercise - 05/03/16 1353      Self-Care   Posture standing   Heat/Ice  Application heat for stiffness   Other Self-Care Comments  general HEP      Moist Heat Therapy   Number Minutes Moist Heat 10 Minutes   Moist Heat Location Lumbar Spine  PT Education - 2016/05/27 1002    Education provided Yes   Education Details PT/POC, HEP, arthritis and stenosis    Person(s) Educated Patient;Child(ren)   Methods Explanation;Demonstration;Handout;Tactile cues;Verbal cues   Comprehension Verbalized understanding;Returned demonstration;Need further instruction          PT Short Term Goals - 05-27-16 1142      PT SHORT TERM GOAL #1   Title Pt and daughter will be I with HEP for initial HEP including cervical AROM, UE strength and trunk flexibility.    Time 4   Period Weeks   Status New     PT SHORT TERM GOAL #2   Title Pt will be able to get in and out of the bed (mat) without increasing pain.    Time 4   Period Weeks   Status New     PT SHORT TERM GOAL #3   Title Pt will be able to stand to hang laundry and in the kitchen with less pain, weakness in UE (25% improved)    Time 4   Period Weeks   Status New     PT SHORT TERM GOAL #4   Title Pt will complete Balance assessment and set goal if appropriate.    Time 2   Period Weeks   Status New           PT Long Term Goals - 2016-05-27 1144      PT LONG TERM GOAL #1   Title Berg balance goal TBA    Time 8   Period Weeks   Status New     PT LONG TERM GOAL #2   Title Pt will be able to walk through grocery store with daughter for up to 30 min and min pain, fatigue in back, LE    Time 8   Period Weeks   Status New     PT LONG TERM GOAL #3   Title Further goals TBA depending on progress.    Time 8   Period Weeks   Status New             G-Codes - May 27, 2016 March 25, 1202    Functional Assessment Tool Used (Outpatient Only) FOTO   Functional Limitation Mobility: Walking and moving around   Mobility: Walking and Moving Around Current Status [phone]) At least 60 percent but  less than 80 percent impaired, limited or restricted   Mobility: Walking and Moving Around Goal Status [phone]) At least 40 percent but less than 60 percent impaired, limited or restricted            Plan - 05/03/16 1429    Clinical Impression Statement Patient with low complexity eval for stable low back and cervical spine pain which has been progressing over the past year.  She is very limited in her ability to stand and use upper body for functional tasks.  She has dementia and needs consistent cueing for following instructions.  She would like to be able to go out with her family, do more around the house without being in such pain.  She has muscle spasms L side of neck, pain intermittently and weakness in hips, upper body and poor posture.  We will see her for 4 weeks to establish HEP and see if she can benefit from a more structured community class or more skilled PT at that point.    Rehab Potential Fair   Clinical Impairments Affecting Rehab Potential dementia    PT Frequency 2x / week   PT Duration  8 weeks   PT Treatment/Interventions Moist Heat;Therapeutic activities;ADLs/Self Care Home Management;Therapeutic exercise;Balance training;Manual techniques;Cryotherapy;Electrical Stimulation;Gait training;Patient/family education;Passive range of motion;Neuromuscular re-education;DME Instruction   PT Next Visit Plan check HEP, balance and posture C AROM    PT Home Exercise Plan scapular retract, posture, LTR and knee to chest , cervical rotation   Consulted and Agree with Plan of Care Patient;Family member/caregiver   Family Member Consulted Daughter      Patient will benefit from skilled therapeutic intervention in order to improve the following deficits and impairments:  Decreased range of motion, Difficulty walking, Increased fascial restricitons, Obesity, Impaired UE functional use, Increased muscle spasms, Decreased activity tolerance, Pain, Improper body mechanics, Impaired  flexibility, Decreased balance, Decreased cognition, Decreased mobility, Decreased strength, Impaired sensation, Postural dysfunction, Abnormal gait, Decreased endurance  Visit Diagnosis: Muscle weakness (generalized)  Abnormal posture  Chronic low back pain without sciatica, unspecified back pain laterality  Cervicalgia      G-Codes - May 20, 2016 1204    Functional Assessment Tool Used (Outpatient Only) FOTO   Functional Limitation Mobility: Walking and moving around   Mobility: Walking and Moving Around Current Status [phone]) At least 60 percent but less than 80 percent impaired, limited or restricted   Mobility: Walking and Moving Around Goal Status [phone]) At least 40 percent but less than 60 percent impaired, limited or restricted       Problem List Patient Active Problem List   Diagnosis Date Noted  . Chronic bilateral low back pain 04/19/2016  . Chronic neck pain 04/19/2016  . Medication refill 04/14/2016  . Joint laxity of right knee 04/14/2016  . Hypothyroidism 02/04/2016  . Vaginitis 07/19/2011  . Upper back pain 08/01/2009  . Transient cerebral ischemia 09/22/2007  . Dementia with behavioral disturbance 10/07/2006  . Essential hypertension 10/07/2006  . COPD (chronic obstructive pulmonary disease) ([place]) 10/07/2006  . GERD 10/07/2006    PAA,JENNIFER 05/20/2016, 12:05 PM  [facility] [address] [place], [place], [address] Phone: [phone]   Fax:  [phone]  Name: Donna Arias MRN: [id number] Date of Birth: November 07, 1926   Raeford Razor, PT May 20, 2016 12:05 PM Phone: [phone] Fax: [phone]  PHYSICAL THERAPY DISCHARGE SUMMARY  Visits from Start of Care: 1  Current functional level related to goals / functional outcomes: See above   Remaining deficits: See above    Education / Equipment: POC Plan: Patient agrees to discharge.  Patient goals were not met. Patient is being discharged due  to the patient's request.  ?????    Pt said she would do PT at home.   Raeford Razor, PT 05/26/16 10:22 AM Phone: [phone] Fax: [phone]

## 2016-05-07 ENCOUNTER — Ambulatory Visit: Payer: Medicare HMO | Admitting: Physical Therapy

## 2016-05-10 ENCOUNTER — Ambulatory Visit: Payer: Medicare HMO | Admitting: Physical Therapy

## 2016-05-14 ENCOUNTER — Encounter: Payer: Medicare HMO | Admitting: Physical Therapy

## 2016-05-17 ENCOUNTER — Ambulatory Visit: Payer: Medicare HMO | Admitting: Physical Therapy

## 2016-05-19 ENCOUNTER — Ambulatory Visit: Payer: Medicare HMO | Admitting: Physical Therapy

## 2016-05-20 ENCOUNTER — Other Ambulatory Visit: Payer: Self-pay | Admitting: Adult Health

## 2016-05-20 DIAGNOSIS — Z09 Encounter for follow-up examination after completed treatment for conditions other than malignant neoplasm: Secondary | ICD-10-CM

## 2016-05-20 NOTE — Telephone Encounter (Signed)
Ok to refill for one year  

## 2016-05-20 NOTE — Telephone Encounter (Signed)
RX was sent to pharmacy by Dr. Tommi Rumps with refills for one year.  No need to address at this time.  Charyl Bigger, CMA

## 2016-05-20 NOTE — Telephone Encounter (Signed)
Per Tommi Rumps- Patient has est. With new provider. Will forward to [facility] for further refills.

## 2016-05-24 ENCOUNTER — Other Ambulatory Visit: Payer: Self-pay

## 2016-05-24 ENCOUNTER — Telehealth: Payer: Self-pay | Admitting: Adult Health

## 2016-05-24 ENCOUNTER — Ambulatory Visit: Payer: Medicare HMO | Admitting: Physical Therapy

## 2016-05-24 DIAGNOSIS — Z09 Encounter for follow-up examination after completed treatment for conditions other than malignant neoplasm: Secondary | ICD-10-CM

## 2016-05-24 MED ORDER — FLUOXETINE HCL 10 MG PO CAPS: 10.0000 mg | ORAL_CAPSULE | Freq: Every day | ORAL | 2 refills | Status: DC

## 2016-05-24 MED ORDER — UMECLIDINIUM-VILANTEROL 62.5-25 MCG/INH IN AEPB: 1.0000 | INHALATION_SPRAY | Freq: Every day | RESPIRATORY_TRACT | 2 refills | Status: DC

## 2016-05-24 NOTE — Telephone Encounter (Signed)
Rx sent to pharmacy per Laural Benes, [facility]

## 2016-05-24 NOTE — Telephone Encounter (Signed)
Pt notified Rx sent to pharmacy

## 2016-05-24 NOTE — Telephone Encounter (Signed)
Patient is requesting a refill of her fluoxetine 10mg  and Anoro inhaler sent to [facility]. She is currently completely out as the pharmacy was sending the requests the past week to old PCP office.

## 2016-05-24 NOTE — Telephone Encounter (Signed)
Approved. Thanks! Valetta Fuller

## 2016-05-27 ENCOUNTER — Encounter: Payer: Medicare HMO | Admitting: Physical Therapy

## 2016-06-10 ENCOUNTER — Ambulatory Visit (INDEPENDENT_AMBULATORY_CARE_PROVIDER_SITE_OTHER): Payer: Medicare HMO | Admitting: Family Medicine

## 2016-06-10 ENCOUNTER — Encounter: Payer: Self-pay | Admitting: Family Medicine

## 2016-06-10 VITALS — BP 117/71 | HR 65 | Temp 98.8°F | Ht 64.25 in | Wt 153.8 lb

## 2016-06-10 DIAGNOSIS — B379 Candidiasis, unspecified: Secondary | ICD-10-CM

## 2016-06-10 DIAGNOSIS — J449 Chronic obstructive pulmonary disease, unspecified: Secondary | ICD-10-CM | POA: Diagnosis not present

## 2016-06-10 DIAGNOSIS — R05 Cough: Secondary | ICD-10-CM

## 2016-06-10 DIAGNOSIS — J329 Chronic sinusitis, unspecified: Secondary | ICD-10-CM

## 2016-06-10 DIAGNOSIS — L22 Diaper dermatitis: Secondary | ICD-10-CM | POA: Diagnosis not present

## 2016-06-10 DIAGNOSIS — R058 Other specified cough: Secondary | ICD-10-CM

## 2016-06-10 DIAGNOSIS — H6123 Impacted cerumen, bilateral: Secondary | ICD-10-CM | POA: Diagnosis not present

## 2016-06-10 DIAGNOSIS — T3695XA Adverse effect of unspecified systemic antibiotic, initial encounter: Secondary | ICD-10-CM

## 2016-06-10 MED ORDER — AMOXICILLIN-POT CLAVULANATE 875-125 MG PO TABS: 1.0000 | ORAL_TABLET | Freq: Two times a day (BID) | ORAL | 0 refills | Status: AC

## 2016-06-10 MED ORDER — FLUCONAZOLE 150 MG PO TABS: 150.0000 mg | ORAL_TABLET | Freq: Every day | ORAL | 0 refills | Status: DC

## 2016-06-10 MED ORDER — BENZONATATE 100 MG PO CAPS: 100.0000 mg | ORAL_CAPSULE | Freq: Three times a day (TID) | ORAL | 0 refills | Status: AC | PRN

## 2016-06-10 NOTE — Patient Instructions (Signed)
- please see handout on diaper dermatitis that I printed for you for addition al info  -> if dev wheezing- use albuterol inhaler  - if sx continue to worsen despite treatment- pt will need to be seen by [name] or urgent care and will likely need a chest xray and possibly change in her medicines.     Zinc Oxide cream, ointment, paste What is this medicine? ZINC OXIDE (zingk OX ide) is used to treat or prevent minor skin irritations such as burns, cuts, and diaper rash. Some products may be used as a sunscreen. This medicine may be used for other purposes; ask your health care provider or pharmacist if you have questions. COMMON BRAND NAME(S): Aquaphor, Balmex, [place]aux Butt Paste, Carlesta, Desitin, Desitin Maximum Strength, Desitin Rapid Relief, Diaper Rash, Dr. Thompson Caul, DynaShield, [place] Buttocks, Medi-Paste, Novana Protect, Triple Paste, Z-Bum What should I tell my health care provider before I take this medicine? They need to know if you have any of these conditions: -an unusual or allergic reaction to zinc oxide, other medicines, foods, dyes, or preservatives -pregnant or trying to get pregnant -breast-feeding How should I use this medicine? This medicine is for external use only. Do not take by mouth. Follow the directions on the prescription or product label. Wash your hands before and after use. Apply a generous amount to the affected area. Do not cover with a bandage or dressing unless your doctor or health care professional tells you to. Do not get this medicine in your eyes. If you do, rinse out with plenty of cool tap water. Talk to your pediatrician regarding the use of this medicine in children. While this drug may be prescribed for selected conditions, precautions do apply. Overdosage: If you think you have taken too much of this medicine contact a poison control center or emergency room at once. NOTE: This medicine is only for you. Do not share this medicine with others. What if  I miss a dose? If you miss a dose, use it as soon as you can. If it is almost time for your next dose, use only that dose. Do not use double or extra doses. What may interact with this medicine? Interactions are not expected. Do not use other skin products at the same site without asking your doctor or health care professional. This list may not describe all possible interactions. Give your health care provider a list of all the medicines, herbs, non-prescription drugs, or dietary supplements you use. Also tell them if you smoke, drink alcohol, or use illegal drugs. Some items may interact with your medicine. What should I watch for while using this medicine? Tell your doctor or health care professional if the area you are treating does not get better within a week. What side effects may I notice from receiving this medicine? Side effects that you should report to your doctor or health care professional as soon as possible: -allergic reactions like skin rash, itching or hives, swelling of the face, lips, or tongue This list may not describe all possible side effects. Call your doctor for medical advice about side effects. You may report side effects to FDA at 1-800-FDA-1088. Where should I keep my medicine? Keep out of reach of children. Store at room temperature. Keep closed while not in use. Throw away an unused medicine after the expiration date. NOTE: This sheet is a summary. It may not cover all possible information. If you have questions about this medicine, talk to your doctor, pharmacist, or health  care provider.  2018 Elsevier/Gold Standard (2015-02-13 11:23:14)

## 2016-06-10 NOTE — Progress Notes (Addendum)
Impression and Recommendations:    1. Non-productive cough   2. Rhinosinusitis   3. Chronic obstructive pulmonary disease, unspecified COPD type ([place])   4. Diaper dermatitis   5. Antibiotic-induced yeast infection   6. Hearing loss secondary to cerumen impaction, bilateral    - abx / meds as below for URI/ possible early bronchitis.  Lungs- clr and reassured pt and daughter.  Counseling done regarding disease process and various treatment options.   All questions answered.  Handouts given if patient desired them  - educated re: diaper rash and dermatitis.  Preventive strategies r/w daughter and handouts given.   Since she is taking ABX--> will give diflucan preventatively.   - Follow-up sooner than planned if symptoms do not continue to improve or you have additional concerns/questions.  The patient was counseled, risk factors were discussed, anticipatory guidance given.  Hearing loss secondary to cerumen impaction, bilateral Indication: Cerumen impaction of the ear(s) Medical necessity statement:  On physical examination, cerumen impairs clinically significant portions of the external auditory canal, and tympanic membrane.  Noted obstructive, copious cerumen that cannot be removed without magnification and instrumentations requiring physician skills  Consent:  Discussed benefits and risks of procedure and verbal consent obtained from daughter of pt  Procedure:   Patient was prepped for the procedure.  Utilized an otoscope to assess and take note of the ear canal, the tympanic membrane, and the presence, amount, and placement of the cerumen. Gentle water irrigation and soft plastic curette was utilized to remove cerumen to L ear canal only.  Supplies/ tools broke mid-procedure- so pt will have to come back next week once we get additional supplies   Post procedure examination:  shows cerumen was removed L, without trauma or injury to the ear canal or TM, which remains intact.     Post-Procedural Ear Care Instructions:    Patient tolerated procedure well.  Proper ear care d/c pt.   The patient is made aware that they may experience temporary vertigo, temporary hearing loss, and temporary discomfort.  If these symptom last for more than 24 hours to call the clinic or proceed to the ED/Urgent Care.   Meds ordered this encounter  Medications  . amoxicillin-clavulanate (AUGMENTIN) 875-125 MG tablet    Sig: Take 1 tablet by mouth 2 (two) times daily.    Dispense:  20 tablet    Refill:  0  . benzonatate (TESSALON PERLES) 100 MG capsule    Sig: Take 1 capsule (100 mg total) by mouth 3 (three) times daily as needed for cough.    Dispense:  30 capsule    Refill:  0  . fluconazole (DIFLUCAN) 150 MG tablet    Sig: Take 1 tablet (150 mg total) by mouth daily. Take 1 tablets by mouth at end of ABX use and repeat 1 week    Dispense:  2 tablet    Refill:  0     Discontinued Medications   KETOROLAC (ACULAR) 0.4 % SOLN    1 drop 4 (four) times daily.   PREDNISOLONE ACETATE (PRED MILD) 0.12 % OPHTHALMIC SUSPENSION    1 drop 4 (four) times daily.    Gross side effects, risk and benefits, and alternatives of medications and treatment plan in general discussed with patient.  Patient is aware that all medications have potential side effects and we are unable to predict every side effect or drug-drug interaction that may occur.   Patient will call with any questions prior to  using medication if they have concerns.  Expresses verbal understanding and consents to current therapy and treatment regimen.  No barriers to understanding were identified.  Red flag symptoms and signs discussed in detail.  Patient expressed understanding regarding what to do in case of emergency\urgent symptoms  Please see AVS handed out to patient at the end of our visit for further patient instructions/ counseling done pertaining to today's office visit.   Return if symptoms worsen or fail to improve.      Note: This document was prepared using Dragon voice recognition software and may include unintentional dictation errors.  Mellody Dance 11:36 PM --------------------------------------------------------------------------------------------------------------------------------------------------------------------------------------------------------------------------------------------    Subjective:    CC:  Chief Complaint  Patient presents with  . Genital lesion  . URI    HPI: DEFNE GERLING is a 83 y.o. female who presents to [place] at [facility] today for issues as discussed below.   1 wk sx of non-prod cough, RN, sneezing, ST,  Ears stopped up and intchy.  Everyone in family with same thing.  Temp- about 100.0 no more. NO n/v/d,   No wh/sob,  More congestion in head when lyes down at night.  H/o COPD,    Using Neb tmxnts - once daily- just yesterday.    Also has c/o vaginal irritation.  Going on several days.  Gettign more red and irritated   Wt Readings from Last 3 Encounters:  06/10/16 153 lb 12.8 oz (69.8 kg)  04/19/16 156 lb 3.2 oz (70.9 kg)  04/14/16 154 lb 1.9 oz (69.9 kg)   BP Readings from Last 3 Encounters:  06/10/16 117/71  04/19/16 (!) 162/88  04/14/16 92/61   Pulse Readings from Last 3 Encounters:  06/10/16 65  04/19/16 70  04/14/16 82   BMI Readings from Last 3 Encounters:  06/10/16 26.19 kg/m  04/19/16 26.60 kg/m  04/14/16 26.25 kg/m     Patient Care Team    Relationship Specialty Notifications Start End  Odella Aquas, NP PCP - General Family Medicine  04/14/16      Patient Active Problem List   Diagnosis Date Noted  . Diaper dermatitis 06/10/2016  . Antibiotic-induced yeast infection 06/10/2016  . Hearing loss secondary to cerumen impaction, bilateral 06/10/2016  . Chronic bilateral low back pain 04/19/2016  . Chronic neck pain 04/19/2016  . Medication refill 04/14/2016  . Joint laxity of right knee 04/14/2016   . Hypothyroidism 02/04/2016  . Vaginitis 07/19/2011  . Upper back pain 08/01/2009  . Transient cerebral ischemia 09/22/2007  . Dementia with behavioral disturbance 10/07/2006  . Essential hypertension 10/07/2006  . COPD (chronic obstructive pulmonary disease) ([place]) 10/07/2006  . GERD 10/07/2006    Past Medical history, Surgical history, Family history, Social history, Allergies and Medications have been entered into the medical record, reviewed and changed as needed.    Current Meds  Medication Sig  . Acetaminophen (TYLENOL 8 HOUR PO) Take by mouth.  [name] albuterol (PROVENTIL HFA;VENTOLIN HFA) 108 (90 Base) MCG/ACT inhaler Inhale 2 puffs into the lungs every 6 (six) hours as needed.  [name] albuterol (PROVENTIL) (2.5 MG/3ML) 0.083% nebulizer solution Take 3 mLs (2.5 mg total) by nebulization every 6 (six) hours as needed for wheezing or shortness of breath.  [name] amLODipine (NORVASC) 5 MG tablet Take 1 tablet (5 mg total) by mouth daily.  [name] aspirin 81 MG tablet Take 81 mg by mouth daily.  [name] Besifloxacin HCl (BESIVANCE) 0.6 % SUSP Apply to eye.  . cyclobenzaprine (FLEXERIL)  5 MG tablet One half tab daily at bedtime when necessary (Patient taking differently: 5 mg. One half tab daily at bedtime when necessary)  . FLUoxetine (PROZAC) 10 MG capsule Take 1 capsule (10 mg total) by mouth daily.  . fluticasone (FLONASE) 50 MCG/ACT nasal spray Place 2 sprays into both nostrils daily.  . furosemide (LASIX) 20 MG tablet Take 1 tablet (20 mg total) by mouth daily.  [name] levothyroxine (SYNTHROID, LEVOTHROID) 50 MCG tablet TAKE ONE (1) TABLET BY MOUTH EVERY DAY  . NON FORMULARY Ear pain drops  . NON FORMULARY cloracidine  . potassium chloride SA (K-DUR,KLOR-CON) 20 MEQ tablet TAKE 1 TABLET BY MOUTH ONCE DAILY  . risperiDONE (RISPERDAL) 1 MG tablet Take 1 tablet (1 mg total) by mouth 2 (two) times daily.  . traMADol (ULTRAM) 50 MG tablet Take 1 tablet (50 mg total) by mouth every 8 (eight) hours as needed.  .  triamcinolone cream (KENALOG) 0.1 % Apply 1 application topically 2 (two) times daily.  [name] umeclidinium-vilanterol (ANORO ELLIPTA) 62.5-25 MCG/INH AEPB Inhale 1 puff into the lungs daily.    Allergies:  Allergies  Allergen Reactions  . Codeine Sulfate     REACTION: unspecified     Review of Systems: General:   Denies fever, chills, unexplained weight loss.  Optho/Auditory:   Denies visual changes, blurred vision/LOV Respiratory:   Denies wheeze, DOE more than baseline levels.  Cardiovascular:   Denies chest pain, palpitations, new onset peripheral edema  Gastrointestinal:   Denies nausea, vomiting, diarrhea, abd pain.  Genitourinary: Denies dysuria, freq/ urgency, flank pain or discharge from genitals.  Endocrine:     Denies hot or cold intolerance, polyuria, polydipsia. Musculoskeletal:   Denies unexplained myalgias, joint swelling, unexplained arthralgias, gait problems.  Skin:  Denies new onset rash, suspicious lesions Neurological:     Denies dizziness, unexplained weakness, numbness  Psychiatric/Behavioral:   Denies mood changes, suicidal or homicidal ideations, hallucinations    Objective:   Blood pressure 117/71, pulse 65, temperature 98.8 F (37.1 C), temperature source Oral, height 5' 4.25" (1.632 m), weight 153 lb 12.8 oz (69.8 kg). Body mass index is 26.19 kg/m. General:  Well Developed, well nourished, appropriate for stated age.  Neuro:  Alert and oriented,  extra-ocular muscles intact  HEENT:  Normocephalic, atraumatic, neck supple, no carotid bruits appreciated; TM's blocked b/l with cerumen.  O/w WNL's, op- mildly erythematous, no LAD, no sinus TTP, nares- clear d/c and swollen nares Skin:  warm, pink. Excoriated / broken down skin L groin where diaper rests ag skin.  No signs yeast/ cellulitis. No vag d/c notes externally Cardiac:  + S1 S2 Respiratory:  ECTA B/L and A/P, no C/W/R;  Not using accessory muscles, speaking in full sentences- unlabored. Vascular:   Ext warm, no cyanosis apprec.; cap RF less 2 sec. Psych:  No HI/SI, judgement and insight good, Euthymic mood. Full Affect.

## 2016-06-10 NOTE — Assessment & Plan Note (Signed)
Indication: Cerumen impaction of the ear(s) Medical necessity statement:  On physical examination, cerumen impairs clinically significant portions of the external auditory canal, and tympanic membrane.  Noted obstructive, copious cerumen that cannot be removed without magnification and instrumentations requiring physician skills  Consent:  Discussed benefits and risks of procedure and verbal consent obtained from daughter of pt  Procedure:   Patient was prepped for the procedure.  Utilized an otoscope to assess and take note of the ear canal, the tympanic membrane, and the presence, amount, and placement of the cerumen. Gentle water irrigation and soft plastic curette was utilized to remove cerumen to L ear canal only.  Supplies/ tools broke mid-procedure- so pt will have to come back next week once we get additional supplies   Post procedure examination:  shows cerumen was removed L, without trauma or injury to the ear canal or TM, which remains intact.    Post-Procedural Ear Care Instructions:    Patient tolerated procedure well.  Proper ear care d/c pt.   The patient is made aware that they may experience temporary vertigo, temporary hearing loss, and temporary discomfort.  If these symptom last for more than 24 hours to call the clinic or proceed to the ED/Urgent Care.

## 2016-06-18 ENCOUNTER — Ambulatory Visit (INDEPENDENT_AMBULATORY_CARE_PROVIDER_SITE_OTHER): Payer: Medicare HMO | Admitting: Family Medicine

## 2016-06-18 ENCOUNTER — Encounter: Payer: Self-pay | Admitting: Family Medicine

## 2016-06-18 VITALS — BP 131/81 | HR 69 | Temp 98.2°F | Ht 64.25 in | Wt 151.0 lb

## 2016-06-18 DIAGNOSIS — H6120 Impacted cerumen, unspecified ear: Secondary | ICD-10-CM

## 2016-06-18 NOTE — Patient Instructions (Signed)
Earwax Buildup Your ears make a substance called earwax. It may also be called cerumen. Sometimes, too much earwax builds up in your ear canal. This can cause ear pain and make it harder for you to hear. CAUSES This condition is caused by too much earwax production or buildup. RISK FACTORS The following factors may make you more likely to develop this condition:  Cleaning your ears often with swabs.  Having narrow ear canals.  Having earwax that is overly thick or sticky.  Having eczema.  Being dehydrated. SYMPTOMS Symptoms of this condition include:  Reduced hearing.  Ear drainage.  Ear pain.  Ear itch.  A feeling of fullness in the ear or feeling that the ear is plugged.  Ringing in the ear.  Coughing. DIAGNOSIS Your health care provider can diagnose this condition based on your symptoms and medical history. Your health care provider will also do an ear exam to look inside your ear with a scope (otoscope). You may also have a hearing test. TREATMENT Treatment for this condition includes:  Over-the-counter or prescription ear drops to soften the earwax.  Earwax removal by a health care provider. This may be done:  By flushing the ear with body-temperature water.  With a medical instrument that has a loop at the end (earwax curette).  With a suction device. HOME CARE INSTRUCTIONS  Take over-the-counter and prescription medicines only as told by your health care provider.  Do not put any objects, including an ear swab, into your ear. You can clean the opening of your ear canal with a washcloth.  Drink enough water to keep your urine clear or pale yellow.  If you have frequent earwax buildup or you use hearing aids, consider seeing your health care provider every 6-12 months for routine preventive ear cleanings. Keep all follow-up visits as told by your health care provider. SEEK MEDICAL CARE IF:  You have ear pain.  Your condition does not improve with  treatment.  You have hearing loss.  You have blood, pus, or other fluid coming from your ear. This information is not intended to replace advice given to you by your health care provider. Make sure you discuss any questions you have with your health care provider. Document Released: 02/19/2004 Document Revised: 05/05/2015 Document Reviewed: 08/28/2014 Elsevier Interactive Patient Education  2017 [employer].

## 2016-06-18 NOTE — Progress Notes (Signed)
Pt here for an acute care OV today   Impression and Recommendations:    1. Impacted cerumen, unspecified laterality- R   2. Hearing loss due to cerumen impaction, unspecified laterality    Indication: Cerumen impaction of the ear- R Medical necessity statement:  On physical examination, cerumen impairs clinically significant portions of the external auditory canal, and tympanic membrane.  Noted obstructive, copious impacted, hard cerumen that cannot be removed without magnification and instrumentations requiring physician skills Consent:  Discussed benefits and risks of procedure and verbal consent obtained Procedure:   Patient was prepped for the procedure.  Utilized an otoscope to assess R and L and take note of the ear canal, the tympanic membrane, and the presence, amount, and placement of the cerumen.  Manual extraction *3  cerumen from L ear- small amnt done and attempted R *4- but too deep and hard/ compacted.   Gentle water irrigation and subsequent soft plastic curette was utilized to remove cerumen. Post procedure examination:  shows cerumen was removed, without trauma or injury to the ear canal or TM, which remains intact.   Post-Procedural Ear Care Instructions:    Patient tolerated procedure well.  Proper ear care d/c pt and pt's daughter.  They declined AVS.  The patient is made aware that they may experience temporary vertigo, temporary hearing loss, and temporary discomfort.  If these symptom last for more than 24 hours to call the clinic or proceed to the ED/Urgent Care.   The patient was counseled, risk factors were discussed, anticipatory guidance given.  I have seen granddaughter of pt for over a year now- many times.  Brady's daughter expressed her desire to change to me from [place] as pt's PCP due to me having other family members.  They have been very pleased with the care they have received here from all providers at [facility].   I told them Valetta Fuller and I work together and  often see each other's pt's but they need to choose 1 PCP for chronic care mgt and f/up/ med RF's etc.  I told them there are never any hard feelings if pt's change from me to [facility] or vice versa.   Return for per Katy's last note in March- q 71mo f/up chronic conditions.     Note: This document was prepared using Dragon voice recognition software and may include unintentional dictation errors.  Mellody Dance 12:56 PM --------------------------------------------------------------------------------------------------------------------------------------------------------------------------------------------------------------------------------------------    Subjective:    CC:  Chief Complaint  Patient presents with  . Ear Fullness    HPI: Donna Arias is a 83 y.o. female who presents to [place] at [facility] today for issues as discussed below.   C/p hearing difficulties and feeling a little fullness in head/ ear.  No pain/ F/C.  Here with daughter   Wt Readings from Last 3 Encounters:  06/18/16 151 lb (68.5 kg)  06/10/16 153 lb 12.8 oz (69.8 kg)  04/19/16 156 lb 3.2 oz (70.9 kg)   BP Readings from Last 3 Encounters:  06/18/16 131/81  06/10/16 117/71  04/19/16 (!) 162/88   BMI Readings from Last 3 Encounters:  06/18/16 25.72 kg/m  06/10/16 26.19 kg/m  04/19/16 26.60 kg/m     Patient Care Team    Relationship Specialty Notifications Start End  Esaw Grandchild, NP PCP - General Family Medicine  04/14/16      Patient Active Problem Arias   Diagnosis Date Noted  . Diaper dermatitis 06/10/2016  . Antibiotic-induced  yeast infection 06/10/2016  . Hearing loss secondary to cerumen impaction, bilateral 06/10/2016  . Chronic bilateral low back pain 04/19/2016  . Chronic neck pain 04/19/2016  . Medication refill 04/14/2016  . Joint laxity of right knee 04/14/2016  . Hypothyroidism 02/04/2016  . Vaginitis 07/19/2011  . Upper back pain 08/01/2009  .  Transient cerebral ischemia 09/22/2007  . Dementia with behavioral disturbance 10/07/2006  . Essential hypertension 10/07/2006  . COPD (chronic obstructive pulmonary disease) ([place]) 10/07/2006  . GERD 10/07/2006    Past Medical history, Surgical history, Family history, Social history, Allergies and Medications have been entered into the medical record, reviewed and changed as needed.    Current Meds  Medication Sig  . Acetaminophen (TYLENOL 8 HOUR PO) Take by mouth.  [name] albuterol (PROVENTIL HFA;VENTOLIN HFA) 108 (90 Base) MCG/ACT inhaler Inhale 2 puffs into the lungs every 6 (six) hours as needed.  [name] albuterol (PROVENTIL) (2.5 MG/3ML) 0.083% nebulizer solution Take 3 mLs (2.5 mg total) by nebulization every 6 (six) hours as needed for wheezing or shortness of breath.  [name] amLODipine (NORVASC) 5 MG tablet Take 1 tablet (5 mg total) by mouth daily.  [name] amoxicillin-clavulanate (AUGMENTIN) 875-125 MG tablet Take 1 tablet by mouth 2 (two) times daily.  [name] aspirin 81 MG tablet Take 81 mg by mouth daily.  . benzonatate (TESSALON PERLES) 100 MG capsule Take 1 capsule (100 mg total) by mouth 3 (three) times daily as needed for cough.  . Besifloxacin HCl (BESIVANCE) 0.6 % SUSP Apply to eye.  . bromfenac (XIBROM) 0.09 % ophthalmic solution 1 drop 2 (two) times daily.  . cyclobenzaprine (FLEXERIL) 5 MG tablet One half tab daily at bedtime when necessary (Patient taking differently: 5 mg. One half tab daily at bedtime when necessary)  . fluconazole (DIFLUCAN) 150 MG tablet Take 1 tablet (150 mg total) by mouth daily. Take 1 tablets by mouth at end of ABX use and repeat 1 week  . FLUoxetine (PROZAC) 10 MG capsule Take 1 capsule (10 mg total) by mouth daily.  . fluticasone (FLONASE) 50 MCG/ACT nasal spray Place 2 sprays into both nostrils daily.  . furosemide (LASIX) 20 MG tablet Take 1 tablet (20 mg total) by mouth daily.  [name] levothyroxine (SYNTHROID, LEVOTHROID) 50 MCG tablet TAKE ONE (1) TABLET BY MOUTH  EVERY DAY  . NON FORMULARY Ear pain drops  . NON FORMULARY cloracidine  . potassium chloride SA (K-DUR,KLOR-CON) 20 MEQ tablet TAKE 1 TABLET BY MOUTH ONCE DAILY  . risperiDONE (RISPERDAL) 1 MG tablet Take 1 tablet (1 mg total) by mouth 2 (two) times daily.  . traMADol (ULTRAM) 50 MG tablet Take 1 tablet (50 mg total) by mouth every 8 (eight) hours as needed.  . triamcinolone cream (KENALOG) 0.1 % Apply 1 application topically 2 (two) times daily.  [name] umeclidinium-vilanterol (ANORO ELLIPTA) 62.5-25 MCG/INH AEPB Inhale 1 puff into the lungs daily.    Allergies:  Allergies  Allergen Reactions  . Codeine Sulfate     REACTION: unspecified     Review of Systems: General:   Denies fever, chills, unexplained weight loss.  Optho/Auditory:   Denies visual changes, blurred vision/LOV Respiratory:   Denies wheeze, DOE more than baseline levels.  Cardiovascular:   Denies chest pain, palpitations, new onset peripheral edema  Gastrointestinal:   Denies nausea, vomiting, diarrhea, abd pain.  Genitourinary: Denies dysuria, freq/ urgency, flank pain or discharge from genitals.  Endocrine:     Denies hot or cold intolerance, polyuria, polydipsia. Musculoskeletal:  Denies unexplained myalgias, joint swelling, unexplained arthralgias, gait problems.  Skin:  Denies new onset rash, suspicious lesions Neurological:     Denies dizziness, unexplained weakness, numbness  Psychiatric/Behavioral:   Denies mood changes, suicidal or homicidal ideations, hallucinations    Objective:   Blood pressure 131/81, pulse 69, temperature 98.2 F (36.8 C), temperature source Oral, height 5' 4.25" (1.632 m), weight 151 lb (68.5 kg), SpO2 97 %. Body mass index is 25.72 kg/m. General:  Well Developed, well nourished, appropriate for stated age.  Neuro:  Alert and oriented,  extra-ocular muscles intact  HEENT:  Normocephalic, atraumatic, neck supple, L EAC- mild to mod amnt cerumen in inf aspect canal; R- Tm totally  obscured by hardened cerumen.  Skin:  no gross rash, warm, pink. Vascular:  Ext warm, no cyanosis apprec.; cap RF less 2 sec. Psych:  No HI/SI, judgement and insight good, Euthymic mood. Full Affect.

## 2016-06-22 ENCOUNTER — Telehealth: Payer: Self-pay | Admitting: Adult Health

## 2016-06-22 NOTE — Telephone Encounter (Signed)
LVM informing Ms. Baxter Flattery that pt was given a 90 day supply with 3 refills in 01/2016.  Advised to call pharmacy for refills.  Charyl Bigger, CMA

## 2016-06-22 NOTE — Telephone Encounter (Signed)
Patient is requesting a refill of the amlodiine 5 mg sent to [facility]

## 2016-07-08 ENCOUNTER — Telehealth: Payer: Self-pay | Admitting: Adult Health

## 2016-07-08 NOTE — Telephone Encounter (Signed)
Pt's daughter called states she need Rx refill of the Potassium Chloride SA  20 MEQ tablets-- Pls call into [facility]. --glh

## 2016-07-09 ENCOUNTER — Telehealth: Payer: Self-pay

## 2016-07-09 ENCOUNTER — Other Ambulatory Visit: Payer: Self-pay | Admitting: Adult Health

## 2016-07-09 DIAGNOSIS — Z760 Encounter for issue of repeat prescription: Secondary | ICD-10-CM

## 2016-07-09 NOTE — Telephone Encounter (Signed)
Pt's daughter called again at 2:30 inquiring why Pharmacy still says Potassium Rx still not refilled--advised that it was auth'd by provider but unfortunately after MA had left for the day on Friday 07/09/16. --Assured pt's daughter that it would be address on Monday 1st thing. --glh

## 2016-07-09 NOTE — Telephone Encounter (Signed)
[facility]- PCP per chart and OV notes.  [facility]

## 2016-07-09 NOTE — Telephone Encounter (Signed)
Afternoon Shannon, Please refill. Thanks! Valetta Fuller

## 2016-07-09 NOTE — Telephone Encounter (Signed)
Dr. Loleta Books patient.

## 2016-07-09 NOTE — Telephone Encounter (Signed)
Refill request from [facility] for potassium cl er 36meq, take daily.  Please advise.

## 2016-07-12 MED ORDER — POTASSIUM CHLORIDE CRYS ER 20 MEQ PO TBCR: EXTENDED_RELEASE_TABLET | ORAL | 3 refills | Status: DC

## 2016-07-12 NOTE — Telephone Encounter (Signed)
Potassium refilled to [facility].

## 2016-07-12 NOTE — Telephone Encounter (Signed)
K+ refill sent in last week.

## 2016-07-12 NOTE — Telephone Encounter (Signed)
Left message informing patient daughter potassium was sent into pharmacy.

## 2016-08-19 ENCOUNTER — Encounter: Payer: Self-pay | Admitting: Family Medicine

## 2016-08-19 ENCOUNTER — Ambulatory Visit (INDEPENDENT_AMBULATORY_CARE_PROVIDER_SITE_OTHER): Payer: Medicare HMO | Admitting: Family Medicine

## 2016-08-19 ENCOUNTER — Ambulatory Visit: Payer: Medicare HMO

## 2016-08-19 VITALS — BP 129/75 | HR 71 | Temp 98.4°F | Ht 64.25 in | Wt 151.0 lb

## 2016-08-19 DIAGNOSIS — R059 Cough, unspecified: Secondary | ICD-10-CM

## 2016-08-19 DIAGNOSIS — R05 Cough: Secondary | ICD-10-CM | POA: Diagnosis not present

## 2016-08-19 DIAGNOSIS — J029 Acute pharyngitis, unspecified: Secondary | ICD-10-CM

## 2016-08-19 DIAGNOSIS — R0982 Postnasal drip: Secondary | ICD-10-CM | POA: Diagnosis not present

## 2016-08-19 DIAGNOSIS — J309 Allergic rhinitis, unspecified: Secondary | ICD-10-CM

## 2016-08-19 DIAGNOSIS — J3089 Other allergic rhinitis: Secondary | ICD-10-CM

## 2016-08-19 MED ORDER — BENZONATATE 100 MG PO CAPS: ORAL_CAPSULE | ORAL | 0 refills | Status: DC

## 2016-08-19 MED ORDER — FLUTICASONE PROPIONATE 50 MCG/ACT NA SUSP: 1.0000 | Freq: Two times a day (BID) | NASAL | 6 refills | Status: DC

## 2016-08-19 NOTE — Patient Instructions (Addendum)
Please use the Flonase nasal spray every day.  1 spray each nostril twice daily.  This may have to be continued daily until allergy season is over  Also for the first 2-3 days max Afrin nasal spray will help with the nose but stop after 3 days of use  Then usual Tessalon Perles for that dry hacking cough.

## 2016-08-19 NOTE — Progress Notes (Signed)
Pt here for an acute care OV today   Impression and Recommendations:    1. Cough      No problem-specific Assessment & Plan notes found for this encounter.   The patient was counseled, risk factors were discussed, anticipatory guidance given.  New Prescriptions   No medications on file    Discontinued Medications   No medications on file     Orders Placed This Encounter  Procedures  . DG Chest 2 View     Gross side effects, risk and benefits, and alternatives of medications and treatment plan in general discussed with patient.  Patient is aware that all medications have potential side effects and we are unable to predict every side effect or drug-drug interaction that may occur.   Patient will call with any questions prior to using medication if they have concerns.  Expresses verbal understanding and consents to current therapy and treatment regimen.  No barriers to understanding were identified.  Red flag symptoms and signs discussed in detail.  Patient expressed understanding regarding what to do in case of emergency\urgent symptoms  Please see AVS handed out to patient at the end of our visit for further patient instructions/ counseling done pertaining to today's office visit.   No Follow-up on file.     Note: This document was prepared using Dragon voice recognition software and may include unintentional dictation errors.  Mellody Dance 10:51 AM --------------------------------------------------------------------------------------------------------------------------------------------------------------------------------------------------------------------------------------------    Subjective:    CC:  Chief Complaint  Patient presents with  . URI    HPI: Donna Arias is a 83 y.o. female who presents to [place] at [facility] today for issues as discussed below.  Sx started yesterday with ST, coughing, sneezing, coughed all night  last night and felt wheezy, no pleurtic CP,  Clear phlegm occ but mostly dry hacking cough with PND.  Chilled but no fevers.   Tried-- tussin DM given and helped some    No problems updated.   Wt Readings from Last 3 Encounters:  08/19/16 151 lb (68.5 kg)  06/18/16 151 lb (68.5 kg)  06/10/16 153 lb 12.8 oz (69.8 kg)   BP Readings from Last 3 Encounters:  08/19/16 129/75  06/18/16 131/81  06/10/16 117/71   BMI Readings from Last 3 Encounters:  08/19/16 25.72 kg/m  06/18/16 25.72 kg/m  06/10/16 26.19 kg/m     Patient Care Team    Relationship Specialty Notifications Start End  Esaw Grandchild, NP PCP - General Family Medicine  04/14/16      Patient Active Problem List   Diagnosis Date Noted  . Diaper dermatitis 06/10/2016  . Antibiotic-induced yeast infection 06/10/2016  . Hearing loss secondary to cerumen impaction, bilateral 06/10/2016  . Chronic bilateral low back pain 04/19/2016  . Chronic neck pain 04/19/2016  . Medication refill 04/14/2016  . Joint laxity of right knee 04/14/2016  . Hypothyroidism 02/04/2016  . Vaginitis 07/19/2011  . Upper back pain 08/01/2009  . Transient cerebral ischemia 09/22/2007  . Dementia with behavioral disturbance 10/07/2006  . Essential hypertension 10/07/2006  . COPD (chronic obstructive pulmonary disease) ([place]) 10/07/2006  . GERD 10/07/2006    Past Medical history, Surgical history, Family history, Social history, Allergies and Medications have been entered into the medical record, reviewed and changed as needed.    Current Meds  Medication Sig  . Acetaminophen (TYLENOL 8 HOUR PO) Take by mouth.  [name] albuterol (PROVENTIL HFA;VENTOLIN HFA) 108 (90 Base) MCG/ACT inhaler Inhale 2  puffs into the lungs every 6 (six) hours as needed.  [name] albuterol (PROVENTIL) (2.5 MG/3ML) 0.083% nebulizer solution Take 3 mLs (2.5 mg total) by nebulization every 6 (six) hours as needed for wheezing or shortness of breath.  [name] amLODipine (NORVASC) 5  MG tablet Take 1 tablet (5 mg total) by mouth daily.  [name] aspirin 81 MG tablet Take 81 mg by mouth daily.  [name] Besifloxacin HCl (BESIVANCE) 0.6 % SUSP Apply to eye.  . bromfenac (XIBROM) 0.09 % ophthalmic solution 1 drop 2 (two) times daily.  . cyclobenzaprine (FLEXERIL) 5 MG tablet One half tab daily at bedtime when necessary (Patient taking differently: 5 mg. One half tab daily at bedtime when necessary)  . fluconazole (DIFLUCAN) 150 MG tablet Take 1 tablet (150 mg total) by mouth daily. Take 1 tablets by mouth at end of ABX use and repeat 1 week  . FLUoxetine (PROZAC) 10 MG capsule Take 1 capsule (10 mg total) by mouth daily.  . fluticasone (FLONASE) 50 MCG/ACT nasal spray Place 2 sprays into both nostrils daily.  . furosemide (LASIX) 20 MG tablet Take 1 tablet (20 mg total) by mouth daily.  [name] levothyroxine (SYNTHROID, LEVOTHROID) 50 MCG tablet TAKE ONE (1) TABLET BY MOUTH EVERY DAY  . NON FORMULARY Ear pain drops  . NON FORMULARY cloracidine  . potassium chloride SA (K-DUR,KLOR-CON) 20 MEQ tablet TAKE 1 TABLET BY MOUTH ONCE DAILY  . risperiDONE (RISPERDAL) 1 MG tablet Take 1 tablet (1 mg total) by mouth 2 (two) times daily.  . traMADol (ULTRAM) 50 MG tablet Take 1 tablet (50 mg total) by mouth every 8 (eight) hours as needed.  . triamcinolone cream (KENALOG) 0.1 % Apply 1 application topically 2 (two) times daily.  [name] umeclidinium-vilanterol (ANORO ELLIPTA) 62.5-25 MCG/INH AEPB Inhale 1 puff into the lungs daily.    Allergies:  Allergies  Allergen Reactions  . Codeine Sulfate     REACTION: unspecified     Review of Systems: General:   Denies fever, chills, unexplained weight loss.  Optho/Auditory:   Denies visual changes, blurred vision/LOV Respiratory:   Denies wheeze, DOE more than baseline levels.  Cardiovascular:   Denies chest pain, palpitations, new onset peripheral edema  Gastrointestinal:   Denies nausea, vomiting, diarrhea, abd pain.  Genitourinary: Denies dysuria, freq/  urgency, flank pain or discharge from genitals.  Endocrine:     Denies hot or cold intolerance, polyuria, polydipsia. Musculoskeletal:   Denies unexplained myalgias, joint swelling, unexplained arthralgias, gait problems.  Skin:  Denies new onset rash, suspicious lesions Neurological:     Denies dizziness, unexplained weakness, numbness  Psychiatric/Behavioral:   Denies mood changes, suicidal or homicidal ideations, hallucinations    Objective:   Blood pressure 129/75, pulse 71, temperature 98.4 F (36.9 C), temperature source Oral, height 5' 4.25" (1.632 m), weight 151 lb (68.5 kg). Body mass index is 25.72 kg/m. General:  Well Developed, well nourished, appropriate for stated age.  Neuro:  Alert and oriented,  extra-ocular muscles intact  HEENT:  Normocephalic, atraumatic, neck supple Skin:  no gross rash, warm, pink. Cardiac:  RRR, S1 S2 Respiratory:  ECTA B/L and A/P, Not using accessory muscles, speaking in full sentences- unlabored. Vascular:  Ext warm, no cyanosis apprec.; cap RF less 2 sec. Psych:  No HI/SI, judgement and insight good, Euthymic mood. Full Affect.

## 2016-08-23 ENCOUNTER — Encounter: Payer: Self-pay | Admitting: Adult Health

## 2016-08-23 ENCOUNTER — Ambulatory Visit (INDEPENDENT_AMBULATORY_CARE_PROVIDER_SITE_OTHER): Payer: Medicare HMO | Admitting: Adult Health

## 2016-08-23 VITALS — BP 131/78 | HR 65 | Ht 64.25 in | Wt 150.3 lb

## 2016-08-23 DIAGNOSIS — Z0000 Encounter for general adult medical examination without abnormal findings: Secondary | ICD-10-CM

## 2016-08-23 DIAGNOSIS — I10 Essential (primary) hypertension: Secondary | ICD-10-CM | POA: Diagnosis not present

## 2016-08-23 DIAGNOSIS — Z09 Encounter for follow-up examination after completed treatment for conditions other than malignant neoplasm: Secondary | ICD-10-CM | POA: Diagnosis not present

## 2016-08-23 MED ORDER — UMECLIDINIUM-VILANTEROL 62.5-25 MCG/INH IN AEPB: 1.0000 | INHALATION_SPRAY | Freq: Every day | RESPIRATORY_TRACT | 3 refills | Status: DC

## 2016-08-23 MED ORDER — FLUOXETINE HCL 10 MG PO CAPS: 10.0000 mg | ORAL_CAPSULE | Freq: Every day | ORAL | 2 refills | Status: DC

## 2016-08-23 NOTE — Assessment & Plan Note (Signed)
BP at goal 131/78 Continue amlodipine 5 mg

## 2016-08-23 NOTE — Patient Instructions (Signed)
Heart-Healthy Eating Plan Many factors influence your heart health, including eating and exercise habits. Heart (coronary) risk increases with abnormal blood fat (lipid) levels. Heart-healthy meal planning includes limiting unhealthy fats, increasing healthy fats, and making other small dietary changes. This includes maintaining a healthy body weight to help keep lipid levels within a normal range. What is my plan? Your health care provider recommends that you:  Get no more than _________% of the total calories in your daily diet from fat.  Limit your intake of saturated fat to less than _________% of your total calories each day.  Limit the amount of cholesterol in your diet to less than _________ mg per day.  What types of fat should I choose?  Choose healthy fats more often. Choose monounsaturated and polyunsaturated fats, such as olive oil and canola oil, flaxseeds, walnuts, almonds, and seeds.  Eat more omega-3 fats. Good choices include salmon, mackerel, sardines, tuna, flaxseed oil, and ground flaxseeds. Aim to eat fish at least two times each week.  Limit saturated fats. Saturated fats are primarily found in animal products, such as meats, butter, and cream. Plant sources of saturated fats include palm oil, palm kernel oil, and coconut oil.  Avoid foods with partially hydrogenated oils in them. These contain trans fats. Examples of foods that contain trans fats are stick margarine, some tub margarines, cookies, crackers, and other baked goods. What general guidelines do I need to follow?  Check food labels carefully to identify foods with trans fats or high amounts of saturated fat.  Fill one half of your plate with vegetables and green salads. Eat 4-5 servings of vegetables per day. A serving of vegetables equals 1 cup of raw leafy vegetables,  cup of raw or cooked cut-up vegetables, or  cup of vegetable juice.  Fill one fourth of your plate with whole grains. Look for the word  "whole" as the first word in the ingredient list.  Fill one fourth of your plate with lean protein foods.  Eat 4-5 servings of fruit per day. A serving of fruit equals one medium whole fruit,  cup of dried fruit,  cup of fresh, frozen, or canned fruit, or  cup of 100% fruit juice.  Eat more foods that contain soluble fiber. Examples of foods that contain this type of fiber are apples, broccoli, carrots, beans, peas, and barley. Aim to get 20-30 g of fiber per day.  Eat more home-cooked food and less restaurant, buffet, and fast food.  Limit or avoid alcohol.  Limit foods that are high in starch and sugar.  Avoid fried foods.  Cook foods by using methods other than frying. Baking, boiling, grilling, and broiling are all great options. Other fat-reducing suggestions include: ? Removing the skin from poultry. ? Removing all visible fats from meats. ? Skimming the fat off of stews, soups, and gravies before serving them. ? Steaming vegetables in water or broth.  Lose weight if you are overweight. Losing just 5-10% of your initial body weight can help your overall health and prevent diseases such as diabetes and heart disease.  Increase your consumption of nuts, legumes, and seeds to 4-5 servings per week. One serving of dried beans or legumes equals  cup after being cooked, one serving of nuts equals 1 ounces, and one serving of seeds equals  ounce or 1 tablespoon.  You may need to monitor your salt (sodium) intake, especially if you have high blood pressure. Talk with your health care provider or dietitian to get  more information about reducing sodium. What foods can I eat? Grains  Breads, including [place], white, pita, wheat, raisin, rye, oatmeal, and [place]. Tortillas that are neither fried nor made with lard or trans fat. Low-fat rolls, including hotdog and hamburger buns and English muffins. Biscuits. Muffins. Waffles. Pancakes. Light popcorn. Whole-grain cereals. Flatbread.  Melba toast. Pretzels. Breadsticks. Rusks. Low-fat snacks and crackers, including oyster, saltine, matzo, graham, animal, and rye. Rice and pasta, including brown rice and those that are made with whole wheat. Vegetables All vegetables. Fruits All fruits, but limit coconut. Meats and Other Protein Sources Lean, well-trimmed beef, veal, pork, and lamb. Chicken and [place] without skin. All fish and shellfish. Wild duck, rabbit, pheasant, and venison. Egg whites or low-cholesterol egg substitutes. Dried beans, peas, lentils, and tofu.Seeds and most nuts. Dairy Low-fat or nonfat cheeses, including ricotta, string, and mozzarella. Skim or 1% milk that is liquid, powdered, or evaporated. Buttermilk that is made with low-fat milk. Nonfat or low-fat yogurt. Beverages Mineral water. Diet carbonated beverages. Sweets and Desserts Sherbets and fruit ices. Honey, jam, marmalade, jelly, and syrups. Meringues and gelatins. Pure sugar candy, such as hard candy, jelly beans, gumdrops, mints, marshmallows, and small amounts of dark chocolate. [employer] Eat all sweets and desserts in moderation. Fats and Oils Nonhydrogenated (trans-free) margarines. Vegetable oils, including soybean, sesame, sunflower, olive, peanut, safflower, corn, canola, and cottonseed. Salad dressings or mayonnaise that are made with a vegetable oil. Limit added fats and oils that you use for cooking, baking, salads, and as spreads. Other Cocoa powder. Coffee and tea. All seasonings and condiments. The items listed above may not be a complete list of recommended foods or beverages. Contact your dietitian for more options. What foods are not recommended? Grains Breads that are made with saturated or trans fats, oils, or whole milk. Croissants. Butter rolls. Cheese breads. Sweet rolls. Donuts. Buttered popcorn. Chow mein noodles. High-fat crackers, such as cheese or butter crackers. Meats and Other Protein Sources Fatty meats, such  as hotdogs, short ribs, sausage, spareribs, bacon, ribeye roast or steak, and mutton. High-fat deli meats, such as salami and bologna. Caviar. Domestic duck and goose. Organ meats, such as kidney, liver, sweetbreads, brains, gizzard, chitterlings, and heart. Dairy Cream, sour cream, cream cheese, and creamed cottage cheese. Whole milk cheeses, including blue (bleu), Monte[place] Jack, [place], [place], [place], [place], [place], [place], [place], and [place]. Whole or 2% milk that is liquid, evaporated, or condensed. Whole buttermilk. Cream sauce or high-fat cheese sauce. Yogurt that is made from whole milk. Beverages Regular sodas and drinks with added sugar. Sweets and Desserts Frosting. Pudding. Cookies. Cakes other than angel food cake. Candy that has milk chocolate or white chocolate, hydrogenated fat, butter, coconut, or unknown ingredients. Buttered syrups. Full-fat ice cream or ice cream drinks. Fats and Oils Gravy that has suet, meat fat, or shortening. Cocoa butter, hydrogenated oils, palm oil, coconut oil, palm kernel oil. These can often be found in baked products, candy, fried foods, nondairy creamers, and whipped toppings. Solid fats and shortenings, including bacon fat, salt pork, lard, and butter. Nondairy cream substitutes, such as coffee creamers and sour cream substitutes. Salad dressings that are made of unknown oils, cheese, or sour cream. The items listed above may not be a complete list of foods and beverages to avoid. Contact your dietitian for more information. This information is not intended to replace advice given to you by your health care provider. Make sure you discuss any questions you have with your health care  provider. Document Released: 10/21/2007 Document Revised: 08/01/2015 Document Reviewed: 07/05/2013 Elsevier Interactive Patient Education  2017 [place].    Hypertension Hypertension, commonly called high blood pressure, is when the force of blood pumping  through the arteries is too strong. The arteries are the blood vessels that carry blood from the heart throughout the body. Hypertension forces the heart to work harder to pump blood and may cause arteries to become narrow or stiff. Having untreated or uncontrolled hypertension can cause heart attacks, strokes, kidney disease, and other problems. A blood pressure reading consists of a higher number over a lower number. Ideally, your blood pressure should be below 120/80. The first ("top") number is called the systolic pressure. It is a measure of the pressure in your arteries as your heart beats. The second ("bottom") number is called the diastolic pressure. It is a measure of the pressure in your arteries as the heart relaxes. What are the causes? The cause of this condition is not known. What increases the risk? Some risk factors for high blood pressure are under your control. Others are not. Factors you can change  Smoking.  Having type 2 diabetes mellitus, high cholesterol, or both.  Not getting enough exercise or physical activity.  Being overweight.  Having too much fat, sugar, calories, or salt (sodium) in your diet.  Drinking too much alcohol. Factors that are difficult or impossible to change  Having chronic kidney disease.  Having a family history of high blood pressure.  Age. Risk increases with age.  Race. You may be at higher risk if you are African-American.  Gender. Men are at higher risk than women before age 63. After age 9, women are at higher risk than men.  Having obstructive sleep apnea.  Stress. What are the signs or symptoms? Extremely high blood pressure (hypertensive crisis) may cause:  Headache.  Anxiety.  Shortness of breath.  Nosebleed.  Nausea and vomiting.  Severe chest pain.  Jerky movements you cannot control (seizures).  How is this diagnosed? This condition is diagnosed by measuring your blood pressure while you are seated, with  your arm resting on a surface. The cuff of the blood pressure monitor will be placed directly against the skin of your upper arm at the level of your heart. It should be measured at least twice using the same arm. Certain conditions can cause a difference in blood pressure between your right and left arms. Certain factors can cause blood pressure readings to be lower or higher than normal (elevated) for a short period of time:  When your blood pressure is higher when you are in a health care provider's office than when you are at home, this is called white coat hypertension. Most people with this condition do not need medicines.  When your blood pressure is higher at home than when you are in a health care provider's office, this is called masked hypertension. Most people with this condition may need medicines to control blood pressure.  If you have a high blood pressure reading during one visit or you have normal blood pressure with other risk factors:  You may be asked to return on a different day to have your blood pressure checked again.  You may be asked to monitor your blood pressure at home for 1 week or longer.  If you are diagnosed with hypertension, you may have other blood or imaging tests to help your health care provider understand your overall risk for other conditions. How is this treated?  This condition is treated by making healthy lifestyle changes, such as eating healthy foods, exercising more, and reducing your alcohol intake. Your health care provider may prescribe medicine if lifestyle changes are not enough to get your blood pressure under control, and if:  Your systolic blood pressure is above 130.  Your diastolic blood pressure is above 80.  Your personal target blood pressure may vary depending on your medical conditions, your age, and other factors. Follow these instructions at home: Eating and drinking  Eat a diet that is high in fiber and potassium, and low in  sodium, added sugar, and fat. An example eating plan is called the DASH (Dietary Approaches to Stop Hypertension) diet. To eat this way: ? Eat plenty of fresh fruits and vegetables. Try to fill half of your plate at each meal with fruits and vegetables. ? Eat whole grains, such as whole wheat pasta, brown rice, or whole grain bread. Fill about one quarter of your plate with whole grains. ? Eat or drink low-fat dairy products, such as skim milk or low-fat yogurt. ? Avoid fatty cuts of meat, processed or cured meats, and poultry with skin. Fill about one quarter of your plate with lean proteins, such as fish, chicken without skin, beans, eggs, and tofu. ? Avoid premade and processed foods. These tend to be higher in sodium, added sugar, and fat.  Reduce your daily sodium intake. Most people with hypertension should eat less than 1,500 mg of sodium a day.  Limit alcohol intake to no more than 1 drink a day for nonpregnant women and 2 drinks a day for men. One drink equals 12 oz of beer, 5 oz of wine, or 1 oz of hard liquor. Lifestyle  Work with your health care provider to maintain a healthy body weight or to lose weight. Ask what an ideal weight is for you.  Get at least 30 minutes of exercise that causes your heart to beat faster (aerobic exercise) most days of the week. Activities may include walking, swimming, or biking.  Include exercise to strengthen your muscles (resistance exercise), such as pilates or lifting weights, as part of your weekly exercise routine. Try to do these types of exercises for 30 minutes at least 3 days a week.  Do not use any products that contain nicotine or tobacco, such as cigarettes and e-cigarettes. If you need help quitting, ask your health care provider.  Monitor your blood pressure at home as told by your health care provider.  Keep all follow-up visits as told by your health care provider. This is important. Medicines  Take over-the-counter and  prescription medicines only as told by your health care provider. Follow directions carefully. Blood pressure medicines must be taken as prescribed.  Do not skip doses of blood pressure medicine. Doing this puts you at risk for problems and can make the medicine less effective.  Ask your health care provider about side effects or reactions to medicines that you should watch for. Contact a health care provider if:  You think you are having a reaction to a medicine you are taking.  You have headaches that keep coming back (recurring).  You feel dizzy.  You have swelling in your ankles.  You have trouble with your vision. Get help right away if:  You develop a severe headache or confusion.  You have unusual weakness or numbness.  You feel faint.  You have severe pain in your chest or abdomen.  You vomit repeatedly.  You have trouble  breathing. Summary  Hypertension is when the force of blood pumping through your arteries is too strong. If this condition is not controlled, it may put you at risk for serious complications.  Your personal target blood pressure may vary depending on your medical conditions, your age, and other factors. For most people, a normal blood pressure is less than 120/80.  Hypertension is treated with lifestyle changes, medicines, or a combination of both. Lifestyle changes include weight loss, eating a healthy, low-sodium diet, exercising more, and limiting alcohol. This information is not intended to replace advice given to you by your health care provider. Make sure you discuss any questions you have with your health care provider. Document Released: 01/11/2005 Document Revised: 12/10/2015 Document Reviewed: 12/10/2015 Elsevier Interactive Patient Education  2018 [place] all medications as directed. Please call when you require medication refills. Please schedule complete physical with fasting labs in Sept. GREAT TO SEE YOU!

## 2016-08-23 NOTE — Progress Notes (Signed)
Subjective:   Donna Arias is a 83 y.o. female who presents for Medicare Annual (Subsequent) preventive examination. Activities of Daily Living In your present state of health, do you have difficulty performing the following activities?  1- Driving - no 2- Managing money - no 3- Feeding yourself - no 4- Getting from the bed to the chair - no 5- Climbing a flight of stairs - no 6- Preparing food and eating - no 7- Bathing or showering - no 8- Getting dressed - no 9- Getting to the toilet - no 10- Using the toilet - no 11- Moving around from place to place - no The 6CIT Dementia Test   How the test works Question Score range Weighting Weighted score        What Year is it 0-1 x4   What month is it 0-1 x3   Give the memory phrase e.g. (John/Smith/[address]/[place])     About what time is it 0-1 x3   Count back from 20-1 0-2 x2   Say months in reverse 0-2 x2   Repeat the memory phrase 0-5 x2   Total score for 6CIT 0-28         0-7 = normal - referral not necessary at present  8- 9 = mild cognitive impairment - probably refer  10-28 = significant cognitive impairment - refer Depression screen [facility] 2/9 08/23/2016 08/23/2016 04/19/2016 04/14/2016 06/13/2014  Decreased Interest 0 0 0 0 1  Down, Depressed, Hopeless 0 0 0 0 -  PHQ - 2 Score 0 0 0 0 1  Altered sleeping 1 - - - -  Tired, decreased energy 3 - - - -  Change in appetite 0 - - - -  Feeling bad or failure about yourself  0 - - - -  Trouble concentrating 0 - - - -  Moving slowly or fidgety/restless 0 - - - -  Suicidal thoughts 0 - - - -  PHQ-9 Score 4 - - - -    Review of Systems: General:   Denies fever, chills, unexplained weight loss.  Optho/Auditory:   Denies visual changes, blurred vision/LOV Respiratory:   Denies SOB, DOE more than baseline levels.  Cardiovascular:   Denies chest pain, palpitations, new onset peripheral edema  Gastrointestinal:   Denies nausea, vomiting, diarrhea.  Genitourinary:  Denies dysuria, freq/ urgency, flank pain or discharge from genitals.  Endocrine:     Denies hot or cold intolerance, polyuria, polydipsia. Musculoskeletal:   Denies unexplained myalgias, new joint swelling, unexplained arthralgias, gait problems.  Skin:  Denies rash, suspicious lesions Neurological:     Denies dizziness, unexplained weakness, numbness  Psychiatric/Behavioral:   Denies mood changes, suicidal or homicidal ideations, hallucinations          Objective:     Vitals: BP 131/78   Pulse 65   Ht 5' 4.25" (1.632 m)   Wt 150 lb 4.8 oz (68.2 kg)   BMI 25.60 kg/m   Body mass index is 25.6 kg/m.   Tobacco History  Smoking Status  . Former Smoker  Smokeless Tobacco  . Never Used     Counseling given: Not Answered   Past Medical History:  Diagnosis Date  . COPD (chronic obstructive pulmonary disease) ([place])   . Dementia   . Dementia   . GERD (gastroesophageal reflux disease)   . Heart attack ([place]) 1998  . Hypertension   . Stroke ([place])   . Thyroid disease    Past Surgical History:  Procedure  Laterality Date  . BREAST LUMPECTOMY  1995   Left Breast  . DILATION AND CURETTAGE OF UTERUS  1975  . GALLBLADDER SURGERY  1993   Family History  Problem Relation Age of Onset  . Hypertension Mother   . Stroke Mother    History  Sexual Activity  . Sexual activity: No    Outpatient Encounter Prescriptions as of 08/23/2016  Medication Sig  . Acetaminophen (TYLENOL 8 HOUR PO) Take by mouth.  [name] albuterol (PROVENTIL HFA;VENTOLIN HFA) 108 (90 Base) MCG/ACT inhaler Inhale 2 puffs into the lungs every 6 (six) hours as needed.  [name] albuterol (PROVENTIL) (2.5 MG/3ML) 0.083% nebulizer solution Take 3 mLs (2.5 mg total) by nebulization every 6 (six) hours as needed for wheezing or shortness of breath.  [name] amLODipine (NORVASC) 5 MG tablet Take 1 tablet (5 mg total) by mouth daily. (Patient taking differently: Take 2.5 mg by mouth daily. )  . aspirin 81 MG tablet Take 81 mg by  mouth daily.  . benzonatate (TESSALON) 100 MG capsule 2 tablets 3 times a day as needed cough  . cyclobenzaprine (FLEXERIL) 5 MG tablet One half tab daily at bedtime when necessary (Patient taking differently: 5 mg. One half tab daily at bedtime when necessary)  . FLUoxetine (PROZAC) 10 MG capsule Take 1 capsule (10 mg total) by mouth daily.  . fluticasone (FLONASE) 50 MCG/ACT nasal spray Place 1 spray into both nostrils 2 (two) times daily.  . furosemide (LASIX) 20 MG tablet Take 1 tablet (20 mg total) by mouth daily.  [name] levothyroxine (SYNTHROID, LEVOTHROID) 50 MCG tablet TAKE ONE (1) TABLET BY MOUTH EVERY DAY  . loratadine (CLARITIN) 10 MG tablet Take 10 mg by mouth daily.  . potassium chloride SA (K-DUR,KLOR-CON) 20 MEQ tablet TAKE 1 TABLET BY MOUTH ONCE DAILY  . risperiDONE (RISPERDAL) 1 MG tablet Take 1 tablet (1 mg total) by mouth 2 (two) times daily.  . traMADol (ULTRAM) 50 MG tablet Take 1 tablet (50 mg total) by mouth every 8 (eight) hours as needed.  . triamcinolone cream (KENALOG) 0.1 % Apply 1 application topically 2 (two) times daily.  [name] umeclidinium-vilanterol (ANORO ELLIPTA) 62.5-25 MCG/INH AEPB Inhale 1 puff into the lungs daily.  . [DISCONTINUED] FLUoxetine (PROZAC) 10 MG capsule Take 1 capsule (10 mg total) by mouth daily.  . [DISCONTINUED] umeclidinium-vilanterol (ANORO ELLIPTA) 62.5-25 MCG/INH AEPB Inhale 1 puff into the lungs daily.  . [DISCONTINUED] Besifloxacin HCl (BESIVANCE) 0.6 % SUSP Apply to eye.  . [DISCONTINUED] bromfenac (XIBROM) 0.09 % ophthalmic solution 1 drop 2 (two) times daily.  . [DISCONTINUED] fluconazole (DIFLUCAN) 150 MG tablet Take 1 tablet (150 mg total) by mouth daily. Take 1 tablets by mouth at end of ABX use and repeat 1 week  . [DISCONTINUED] NON FORMULARY Ear pain drops  . [DISCONTINUED] NON FORMULARY cloracidine   No facility-administered encounter medications on file as of 08/23/2016.     Activities of Daily Living In your present state of  health, do you have any difficulty performing the following activities: 08/23/2016  Hearing? Y  Vision? N  Difficulty concentrating or making decisions? N  Walking or climbing stairs? Y  Dressing or bathing? N  Doing errands, shopping? N  Some recent data might be hidden    Patient Care Team: Esaw Grandchild, NP as PCP - General (Family Medicine)    Assessment:     Exercise Activities and Dietary recommendations Current Exercise Habits: The patient does not participate in regular exercise at present  Goals    None     Fall Risk Fall Risk  08/23/2016 04/19/2016 06/13/2014 04/30/2013  Falls in the past year? No No No No   Depression Screen PHQ 2/9 Scores 08/23/2016 08/23/2016 04/19/2016 04/14/2016  PHQ - 2 Score 0 0 0 0  PHQ- 9 Score 4 - - -     Cognitive Function     6CIT Screen 08/23/2016  What Year? 0 points  What month? 0 points  What time? 0 points  Count back from 20 4 points  Months in reverse 4 points  Repeat phrase 10 points  Total Score 18    Immunization History  Administered Date(s) Administered  . H1N1 02/26/2008  . Influenza Split 12/02/2010, 10/07/2011  . Influenza Whole 11/17/2006, 10/27/2007, 12/01/2009  . Influenza, High Dose Seasonal PF 10/15/2014, 11/10/2015  . Influenza,inj,Quad PF,36+ Mos 11/10/2012, 09/18/2013  . Pneumococcal Conjugate-13 04/30/2013  . Pneumococcal Polysaccharide-23 01/26/2003  . Td 01/25/1997, 02/26/2008  . Zoster 04/08/2008   Screening Tests Health Maintenance  Topic Date Due  . DEXA SCAN  06/12/1993  . INFLUENZA VACCINE  08/25/2016  . TETANUS/TDAP  02/25/2018  . PNA vac Low Risk Adult  Completed      Plan:   Continue all medications as directed. Continue excellent water intake and regular movement (as tolerated). CPE in 3 months.    I have personally reviewed and noted the following in the patient's chart:   . Medical and social history . Use of alcohol, tobacco or illicit drugs  . Current medications and  supplements . Functional ability and status . Nutritional status . Physical activity . Advanced directives . List of other physicians . Hospitalizations, surgeries, and ER visits in previous 12 months . Vitals . Screenings to include cognitive, depression, and falls . Referrals and appointments  In addition, I have reviewed and discussed with patient certain preventive protocols, quality metrics, and best practice recommendations. A written personalized care plan for preventive services as well as general preventive health recommendations were provided to patient.     Esaw Grandchild, NP  08/23/2016

## 2016-09-16 ENCOUNTER — Other Ambulatory Visit: Payer: Self-pay | Admitting: Adult Health

## 2016-09-16 DIAGNOSIS — Z760 Encounter for issue of repeat prescription: Secondary | ICD-10-CM

## 2016-09-16 MED ORDER — RISPERIDONE 1 MG PO TABS: 1.0000 mg | ORAL_TABLET | Freq: Two times a day (BID) | ORAL | 3 refills | Status: DC

## 2016-09-16 NOTE — Telephone Encounter (Signed)
Patient is requesting a refill of her Risperdal sent to [facility] (new pharmacy)

## 2016-09-16 NOTE — Telephone Encounter (Signed)
We have not prescribed these medications for the patient previously.  Please review and refill if appropriate.  T. Nelson, CMA  

## 2016-09-16 NOTE — Addendum Note (Signed)
Addended by: Fonnie Mu on: 09/16/2016 10:24 AM   Modules accepted: Orders

## 2016-10-14 ENCOUNTER — Ambulatory Visit (INDEPENDENT_AMBULATORY_CARE_PROVIDER_SITE_OTHER): Payer: Medicare HMO | Admitting: Adult Health

## 2016-10-14 ENCOUNTER — Encounter: Payer: Self-pay | Admitting: Adult Health

## 2016-10-14 VITALS — BP 113/78 | HR 72 | Ht 64.25 in | Wt 151.7 lb

## 2016-10-14 DIAGNOSIS — J449 Chronic obstructive pulmonary disease, unspecified: Secondary | ICD-10-CM | POA: Diagnosis not present

## 2016-10-14 DIAGNOSIS — Z0000 Encounter for general adult medical examination without abnormal findings: Secondary | ICD-10-CM | POA: Diagnosis not present

## 2016-10-14 DIAGNOSIS — G8929 Other chronic pain: Secondary | ICD-10-CM

## 2016-10-14 DIAGNOSIS — F0391 Unspecified dementia with behavioral disturbance: Secondary | ICD-10-CM

## 2016-10-14 DIAGNOSIS — Z780 Asymptomatic menopausal state: Secondary | ICD-10-CM

## 2016-10-14 DIAGNOSIS — M542 Cervicalgia: Secondary | ICD-10-CM

## 2016-10-14 DIAGNOSIS — I10 Essential (primary) hypertension: Secondary | ICD-10-CM

## 2016-10-14 MED ORDER — FEXOFENADINE HCL 60 MG PO TABS: 60.0000 mg | ORAL_TABLET | Freq: Every day | ORAL | 1 refills | Status: DC

## 2016-10-14 MED ORDER — TRIAMCINOLONE ACETONIDE 0.1 % EX CREA: 1.0000 "application " | TOPICAL_CREAM | Freq: Two times a day (BID) | CUTANEOUS | 1 refills | Status: DC

## 2016-10-14 MED ORDER — KETOCONAZOLE 2 % EX SHAM: 1.0000 "application " | MEDICATED_SHAMPOO | CUTANEOUS | 1 refills | Status: DC

## 2016-10-14 NOTE — Patient Instructions (Addendum)
Heart-Healthy Eating Plan Many factors influence your heart health, including eating and exercise habits. Heart (coronary) risk increases with abnormal blood fat (lipid) levels. Heart-healthy meal planning includes limiting unhealthy fats, increasing healthy fats, and making other small dietary changes. This includes maintaining a healthy body weight to help keep lipid levels within a normal range. What is my plan? Your health care provider recommends that you:  Get no more than __25_% of the total calories in your daily diet from fat.  Limit your intake of saturated fat to less than _5__% of your total calories each day.  Limit the amount of cholesterol in your diet to less than _300__ mg per day.  What types of fat should I choose?  Choose healthy fats more often. Choose monounsaturated and polyunsaturated fats, such as olive oil and canola oil, flaxseeds, walnuts, almonds, and seeds.  Eat more omega-3 fats. Good choices include salmon, mackerel, sardines, tuna, flaxseed oil, and ground flaxseeds. Aim to eat fish at least two times each week.  Limit saturated fats. Saturated fats are primarily found in animal products, such as meats, butter, and cream. Plant sources of saturated fats include palm oil, palm kernel oil, and coconut oil.  Avoid foods with partially hydrogenated oils in them. These contain trans fats. Examples of foods that contain trans fats are stick margarine, some tub margarines, cookies, crackers, and other baked goods. What general guidelines do I need to follow?  Check food labels carefully to identify foods with trans fats or high amounts of saturated fat.  Fill one half of your plate with vegetables and green salads. Eat 4-5 servings of vegetables per day. A serving of vegetables equals 1 cup of raw leafy vegetables,  cup of raw or cooked cut-up vegetables, or  cup of vegetable juice.  Fill one fourth of your plate with whole grains. Look for the word "whole" as  the first word in the ingredient list.  Fill one fourth of your plate with lean protein foods.  Eat 4-5 servings of fruit per day. A serving of fruit equals one medium whole fruit,  cup of dried fruit,  cup of fresh, frozen, or canned fruit, or  cup of 100% fruit juice.  Eat more foods that contain soluble fiber. Examples of foods that contain this type of fiber are apples, broccoli, carrots, beans, peas, and barley. Aim to get 20-30 g of fiber per day.  Eat more home-cooked food and less restaurant, buffet, and fast food.  Limit or avoid alcohol.  Limit foods that are high in starch and sugar.  Avoid fried foods.  Cook foods by using methods other than frying. Baking, boiling, grilling, and broiling are all great options. Other fat-reducing suggestions include: ? Removing the skin from poultry. ? Removing all visible fats from meats. ? Skimming the fat off of stews, soups, and gravies before serving them. ? Steaming vegetables in water or broth.  Lose weight if you are overweight. Losing just 5-10% of your initial body weight can help your overall health and prevent diseases such as diabetes and heart disease.  Increase your consumption of nuts, legumes, and seeds to 4-5 servings per week. One serving of dried beans or legumes equals  cup after being cooked, one serving of nuts equals 1 ounces, and one serving of seeds equals  ounce or 1 tablespoon.  You may need to monitor your salt (sodium) intake, especially if you have high blood pressure. Talk with your health care provider or dietitian to get  more information about reducing sodium. What foods can I eat? Grains  Breads, including [place], white, pita, wheat, raisin, rye, oatmeal, and [place]. Tortillas that are neither fried nor made with lard or trans fat. Low-fat rolls, including hotdog and hamburger buns and English muffins. Biscuits. Muffins. Waffles. Pancakes. Light popcorn. Whole-grain cereals. Flatbread. Melba toast.  Pretzels. Breadsticks. Rusks. Low-fat snacks and crackers, including oyster, saltine, matzo, graham, animal, and rye. Rice and pasta, including brown rice and those that are made with whole wheat. Vegetables All vegetables. Fruits All fruits, but limit coconut. Meats and Other Protein Sources Lean, well-trimmed beef, veal, pork, and lamb. Chicken and [place] without skin. All fish and shellfish. Wild duck, rabbit, pheasant, and venison. Egg whites or low-cholesterol egg substitutes. Dried beans, peas, lentils, and tofu.Seeds and most nuts. Dairy Low-fat or nonfat cheeses, including ricotta, string, and mozzarella. Skim or 1% milk that is liquid, powdered, or evaporated. Buttermilk that is made with low-fat milk. Nonfat or low-fat yogurt. Beverages Mineral water. Diet carbonated beverages. Sweets and Desserts Sherbets and fruit ices. Honey, jam, marmalade, jelly, and syrups. Meringues and gelatins. Pure sugar candy, such as hard candy, jelly beans, gumdrops, mints, marshmallows, and small amounts of dark chocolate. [employer] Eat all sweets and desserts in moderation. Fats and Oils Nonhydrogenated (trans-free) margarines. Vegetable oils, including soybean, sesame, sunflower, olive, peanut, safflower, corn, canola, and cottonseed. Salad dressings or mayonnaise that are made with a vegetable oil. Limit added fats and oils that you use for cooking, baking, salads, and as spreads. Other Cocoa powder. Coffee and tea. All seasonings and condiments. The items listed above may not be a complete list of recommended foods or beverages. Contact your dietitian for more options. What foods are not recommended? Grains Breads that are made with saturated or trans fats, oils, or whole milk. Croissants. Butter rolls. Cheese breads. Sweet rolls. Donuts. Buttered popcorn. Chow mein noodles. High-fat crackers, such as cheese or butter crackers. Meats and Other Protein Sources Fatty meats, such as hotdogs,  short ribs, sausage, spareribs, bacon, ribeye roast or steak, and mutton. High-fat deli meats, such as salami and bologna. Caviar. Domestic duck and goose. Organ meats, such as kidney, liver, sweetbreads, brains, gizzard, chitterlings, and heart. Dairy Cream, sour cream, cream cheese, and creamed cottage cheese. Whole milk cheeses, including blue (bleu), Monterey Jack, [place], [place], American, [place], Swiss, [place], [place], and [place]. Whole or 2% milk that is liquid, evaporated, or condensed. Whole buttermilk. Cream sauce or high-fat cheese sauce. Yogurt that is made from whole milk. Beverages Regular sodas and drinks with added sugar. Sweets and Desserts Frosting. Pudding. Cookies. Cakes other than angel food cake. Candy that has milk chocolate or white chocolate, hydrogenated fat, butter, coconut, or unknown ingredients. Buttered syrups. Full-fat ice cream or ice cream drinks. Fats and Oils Gravy that has suet, meat fat, or shortening. Cocoa butter, hydrogenated oils, palm oil, coconut oil, palm kernel oil. These can often be found in baked products, candy, fried foods, nondairy creamers, and whipped toppings. Solid fats and shortenings, including bacon fat, salt pork, lard, and butter. Nondairy cream substitutes, such as coffee creamers and sour cream substitutes. Salad dressings that are made of unknown oils, cheese, or sour cream. The items listed above may not be a complete list of foods and beverages to avoid. Contact your dietitian for more information. This information is not intended to replace advice given to you by your health care provider. Make sure you discuss any questions you have with your health care  provider. Document Released: 10/21/2007 Document Revised: 08/01/2015 Document Reviewed: 07/05/2013 Elsevier Interactive Patient Education  2017 [employer] Inc.   Neck Exercises Neck exercises can be important for many reasons:  They can help you to improve and maintain  flexibility in your neck. This can be especially important as you age.  They can help to make your neck stronger. This can make movement easier.  They can reduce or prevent neck pain.  They may help your upper back.  Ask your health care provider which neck exercises would be best for you. Exercises Neck Press Repeat this exercise 10 times. Do it first thing in the morning and right before bed or as told by your health care provider. 1. Lie on your back on a firm bed or on the floor with a pillow under your head. 2. Use your neck muscles to push your head down on the pillow and straighten your spine. 3. Hold the position as well as you can. Keep your head facing up and your chin tucked. 4. Slowly count to 5 while holding this position. 5. Relax for a few seconds. Then repeat.  Isometric Strengthening Do a full set of these exercises 2 times a day or as told by your health care provider. 1. Sit in a supportive chair and place your hand on your forehead. 2. Push forward with your head and neck while pushing back with your hand. Hold for 10 seconds. 3. Relax. Then repeat the exercise 3 times. 4. Next, do thesequence again, this time putting your hand against the back of your head. Use your head and neck to push backward against the hand pressure. 5. Finally, do the same exercise on either side of your head, pushing sideways against the pressure of your hand.  Prone Head Lifts Repeat this exercise 5 times. Do this 2 times a day or as told by your health care provider. 1. Lie face-down, resting on your elbows so that your chest and upper back are raised. 2. Start with your head facing downward, near your chest. Position your chin either on or near your chest. 3. Slowly lift your head upward. Lift until you are looking straight ahead. Then continue lifting your head as far back as you can stretch. 4. Hold your head up for 5 seconds. Then slowly lower it to your starting position.  Supine  Head Lifts Repeat this exercise 8-10 times. Do this 2 times a day or as told by your health care provider. 1. Lie on your back, bending your knees to point to the ceiling and keeping your feet flat on the floor. 2. Lift your head slowly off the floor, raising your chin toward your chest. 3. Hold for 5 seconds. 4. Relax and repeat.  Scapular Retraction Repeat this exercise 5 times. Do this 2 times a day or as told by your health care provider. 1. Stand with your arms at your sides. Look straight ahead. 2. Slowly pull both shoulders backward and downward until you feel a stretch between your shoulder blades in your upper back. 3. Hold for 10-30 seconds. 4. Relax and repeat.  Contact a health care provider if:  Your neck pain or discomfort gets much worse when you do an exercise.  Your neck pain or discomfort does not improve within 2 hours after you exercise. If you have any of these problems, stop exercising right away. Do not do the exercises again unless your health care provider says that you can. Get help right away if:  You develop sudden, severe neck pain. If this happens, stop exercising right away. Do not do the exercises again unless your health care provider says that you can. Exercises Neck Stretch  Repeat this exercise 3-5 times. 1. Do this exercise while standing or while sitting in a chair. 2. Place your feet flat on the floor, shoulder-width apart. 3. Slowly turn your head to the right. Turn it all the way to the right so you can look over your right shoulder. Do not tilt or tip your head. 4. Hold this position for 10-30 seconds. 5. Slowly turn your head to the left, to look over your left shoulder. 6. Hold this position for 10-30 seconds.  Neck Retraction Repeat this exercise 8-10 times. Do this 3-4 times a day or as told by your health care provider. 1. Do this exercise while standing or while sitting in a sturdy chair. 2. Look straight ahead. Do not bend your  neck. 3. Use your fingers to push your chin backward. Do not bend your neck for this movement. Continue to face straight ahead. If you are doing the exercise properly, you will feel a slight sensation in your throat and a stretch at the back of your neck. 4. Hold the stretch for 1-2 seconds. Relax and repeat.  This information is not intended to replace advice given to you by your health care provider. Make sure you discuss any questions you have with your health care provider. Document Released: 12/23/2014 Document Revised: 06/19/2015 Document Reviewed: 07/22/2014 Elsevier Interactive Patient Education  2017 [place] and start daily Allegra 60mg -insurance may not cover this, then please just buy OTC. Refills sent in, with new rx of Ketoconazole shamppo. Continue excellent hydration and regular movement-as tolerated. Follow-up in 6 months, sooner if needed.  NICE TO SEE YOU!

## 2016-10-14 NOTE — Assessment & Plan Note (Signed)
She was started on Risperidone 1mg  BID decades ago. Her mood/behavior is stable without liable swings. Discussed at length whether Neurological assessment bu specialist was necessary.  Determined that if she continues to tolerate medication without SE and mood remains well controlled, we will continue to manage here.

## 2016-10-14 NOTE — Assessment & Plan Note (Signed)
Stable on Anoro, continues to abstain from tobacco use.

## 2016-10-14 NOTE — Assessment & Plan Note (Signed)
Stretch daily and use heating pad as needed.  Continue to use Tramadol and Cyclobenzaprine sparingly as she has been.

## 2016-10-14 NOTE — Assessment & Plan Note (Signed)
Needs full set of fasting labs-please call and schedule nurse visit to obtain.
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
## 2016-10-14 NOTE — Assessment & Plan Note (Addendum)
BP at goal 113/78, HR 72 Continue on Furosemide 20mg  daily and K-Dur 20 MeQ daily. Continue to follow heart healthy diet and stay hydrated.

## 2016-10-18 ENCOUNTER — Other Ambulatory Visit: Payer: Self-pay | Admitting: Adult Health

## 2016-10-18 DIAGNOSIS — E2839 Other primary ovarian failure: Secondary | ICD-10-CM

## 2016-10-20 ENCOUNTER — Ambulatory Visit: Payer: Medicare HMO

## 2016-10-25 ENCOUNTER — Ambulatory Visit (INDEPENDENT_AMBULATORY_CARE_PROVIDER_SITE_OTHER): Payer: Medicare HMO

## 2016-10-25 VITALS — BP 145/82 | HR 63 | Temp 98.1°F

## 2016-10-25 DIAGNOSIS — Z23 Encounter for immunization: Secondary | ICD-10-CM | POA: Diagnosis not present

## 2016-10-25 DIAGNOSIS — Z0000 Encounter for general adult medical examination without abnormal findings: Secondary | ICD-10-CM | POA: Diagnosis not present

## 2016-10-26 LAB — COMPREHENSIVE METABOLIC PANEL
ALBUMIN: 4.1 g/dL (ref 3.5–4.7)
ALT: 16 IU/L (ref 0–32)
AST: 24 IU/L (ref 0–40)
Albumin/Globulin Ratio: 1.6 (ref 1.2–2.2)
Alkaline Phosphatase: 106 IU/L (ref 39–117)
BUN / CREAT RATIO: 16 (ref 12–28)
BUN: 17 mg/dL (ref 8–27)
Bilirubin Total: 0.3 mg/dL (ref 0.0–1.2)
CALCIUM: 9.4 mg/dL (ref 8.7–10.3)
CO2: 26 mmol/L (ref 20–29)
Chloride: 108 mmol/L — ABNORMAL HIGH (ref 96–106)
Creatinine, Ser: 1.07 mg/dL — ABNORMAL HIGH (ref 0.57–1.00)
GFR, EST AFRICAN AMERICAN: 54 mL/min/{1.73_m2} — AB (ref >59)
GFR, EST NON AFRICAN AMERICAN: 46 mL/min/{1.73_m2} — AB (ref >59)
GLUCOSE: 97 mg/dL (ref 65–99)
Globulin, Total: 2.6 g/dL (ref 1.5–4.5)
Potassium: 4.4 mmol/L (ref 3.5–5.2)
Sodium: 147 mmol/L — ABNORMAL HIGH (ref 134–144)
TOTAL PROTEIN: 6.7 g/dL (ref 6.0–8.5)

## 2016-10-26 LAB — HEMOGLOBIN A1C
Est. average glucose Bld gHb Est-mCnc: 111 mg/dL
Hgb A1c MFr Bld: 5.5 % (ref 4.8–5.6)

## 2016-10-26 LAB — CBC WITH DIFFERENTIAL/PLATELET
BASOS: 0 %
Basophils Absolute: 0 10*3/uL (ref 0.0–0.2)
EOS (ABSOLUTE): 0.1 10*3/uL (ref 0.0–0.4)
Eos: 2 %
HEMOGLOBIN: 13.8 g/dL (ref 11.1–15.9)
Hematocrit: 41.7 % (ref 34.0–46.6)
IMMATURE GRANS (ABS): 0 10*3/uL (ref 0.0–0.1)
Immature Granulocytes: 0 %
Lymphocytes Absolute: 1.7 10*3/uL (ref 0.7–3.1)
Lymphs: 32 %
MCH: 31.2 pg (ref 26.6–33.0)
MCHC: 33.1 g/dL (ref 31.5–35.7)
MCV: 94 fL (ref 79–97)
MONOCYTES: 8 %
Monocytes Absolute: 0.4 10*3/uL (ref 0.1–0.9)
NEUTROS ABS: 3 10*3/uL (ref 1.4–7.0)
Neutrophils: 58 %
Platelets: 187 10*3/uL (ref 150–379)
RBC: 4.42 x10E6/uL (ref 3.77–5.28)
RDW: 14.8 % (ref 12.3–15.4)
WBC: 5.3 10*3/uL (ref 3.4–10.8)

## 2016-10-26 LAB — LIPID PANEL
CHOL/HDL RATIO: 3.7 ratio (ref 0.0–4.4)
CHOLESTEROL TOTAL: 234 mg/dL — AB (ref 100–199)
HDL: 63 mg/dL (ref >39)
LDL CALC: 149 mg/dL — AB (ref 0–99)
TRIGLYCERIDES: 108 mg/dL (ref 0–149)
VLDL Cholesterol Cal: 22 mg/dL (ref 5–40)

## 2016-10-26 LAB — TSH: TSH: 4.34 u[IU]/mL (ref 0.450–4.500)

## 2016-10-26 LAB — VITAMIN D 25 HYDROXY (VIT D DEFICIENCY, FRACTURES): Vit D, 25-Hydroxy: 12.9 ng/mL — ABNORMAL LOW (ref 30.0–100.0)

## 2016-10-26 LAB — VITAMIN B12: Vitamin B-12: 321 pg/mL (ref 232–1245)

## 2016-11-01 ENCOUNTER — Other Ambulatory Visit: Payer: Self-pay | Admitting: Adult Health

## 2016-11-01 DIAGNOSIS — E559 Vitamin D deficiency, unspecified: Secondary | ICD-10-CM

## 2016-11-01 DIAGNOSIS — Z79899 Other long term (current) drug therapy: Secondary | ICD-10-CM

## 2016-11-01 DIAGNOSIS — E789 Disorder of lipoprotein metabolism, unspecified: Secondary | ICD-10-CM

## 2016-11-01 MED ORDER — ATORVASTATIN CALCIUM 10 MG PO TABS: 10.0000 mg | ORAL_TABLET | Freq: Every day | ORAL | 1 refills | Status: DC

## 2016-11-01 MED ORDER — VITAMIN D (ERGOCALCIFEROL) 1.25 MG (50000 UNIT) PO CAPS: 50000.0000 [IU] | ORAL_CAPSULE | ORAL | 0 refills | Status: DC

## 2016-11-02 NOTE — Progress Notes (Signed)
Lipid panel and subsequent ASCVD risk recommended statin therapy. She is 60, however longevity runs in her family and she has been doing quite well for the last few decades. Life expectancy 5-10 years more. Started on Atorvastatin 10mg  daily and will check ALT in 6 weeks, lipids in 4 months. If she does not tolerated medication, will d/c and not further pursue chol tx. Discussed this with Dr. Raliegh Scarlet

## 2016-11-08 ENCOUNTER — Ambulatory Visit
Admission: RE | Admit: 2016-11-08 | Discharge: 2016-11-08 | Disposition: A | Discharge status: Home / Self Care | Payer: Medicare HMO | Source: Ambulatory Visit | Attending: Adult Health | Admitting: Adult Health

## 2016-11-08 DIAGNOSIS — M810 Age-related osteoporosis without current pathological fracture: Secondary | ICD-10-CM | POA: Diagnosis not present

## 2016-11-08 DIAGNOSIS — Z780 Asymptomatic menopausal state: Secondary | ICD-10-CM | POA: Diagnosis not present

## 2016-11-08 DIAGNOSIS — E2839 Other primary ovarian failure: Secondary | ICD-10-CM

## 2016-11-10 ENCOUNTER — Other Ambulatory Visit: Payer: Self-pay | Admitting: Adult Health

## 2016-11-10 DIAGNOSIS — M810 Age-related osteoporosis without current pathological fracture: Secondary | ICD-10-CM

## 2016-11-10 NOTE — Progress Notes (Signed)
Will discuss results and determine is bisphosphonate is appropriate at OV. She is a relatively healthy 83 year old with life expectancy 5-10 yrs

## 2016-11-16 ENCOUNTER — Ambulatory Visit (INDEPENDENT_AMBULATORY_CARE_PROVIDER_SITE_OTHER): Payer: Medicare HMO | Admitting: Adult Health

## 2016-11-16 ENCOUNTER — Encounter: Payer: Self-pay | Admitting: Adult Health

## 2016-11-16 VITALS — BP 113/73 | HR 80 | Ht 64.25 in | Wt 152.0 lb

## 2016-11-16 DIAGNOSIS — F0391 Unspecified dementia with behavioral disturbance: Secondary | ICD-10-CM | POA: Diagnosis not present

## 2016-11-16 DIAGNOSIS — M810 Age-related osteoporosis without current pathological fracture: Secondary | ICD-10-CM | POA: Diagnosis not present

## 2016-11-16 DIAGNOSIS — R05 Cough: Secondary | ICD-10-CM | POA: Diagnosis not present

## 2016-11-16 DIAGNOSIS — R059 Cough, unspecified: Secondary | ICD-10-CM | POA: Insufficient documentation

## 2016-11-16 DIAGNOSIS — Z0000 Encounter for general adult medical examination without abnormal findings: Secondary | ICD-10-CM

## 2016-11-16 DIAGNOSIS — I10 Essential (primary) hypertension: Secondary | ICD-10-CM

## 2016-11-16 MED ORDER — BENZONATATE 100 MG PO CAPS: 100.0000 mg | ORAL_CAPSULE | Freq: Three times a day (TID) | ORAL | 0 refills | Status: DC | PRN

## 2016-11-16 NOTE — Assessment & Plan Note (Addendum)
Continue your excellent water intake and heart healthy diet. Please return for labs - 11/2016 (ALT) and 02/2017 (lipids) Regular follow-up in 6 months, sooner if needed. Daughter at [facility] and all questions/concerns addressed at length.

## 2016-11-16 NOTE — Assessment & Plan Note (Signed)
Stable

## 2016-11-16 NOTE — Assessment & Plan Note (Signed)
Hx of cough-none during OV. One rx of Tessalon sent in if you need prior to your family trip to [place] next month.

## 2016-11-16 NOTE — Progress Notes (Signed)
Subjective:    Patient ID: Donna Arias, female    DOB: 1927/01/04, 83 y.o.   MRN: [id number]  HPI:  Donna Arias is here to discuss results of Bone Density Screening and how she it tolerating Atorvastatin 10mg  daily.  She drinks water and juice all day and denies myalgia's.  She abstains from ETOH and denies RUQ pain. Her only musculoskeletal complaint is chronic neck pain with stiffness.   ASSESSMENT on 11/08/2016: The BMD measured at Femur Total Right is 0.585 g/cm2 with a T-score of -3.4. This patient is considered osteoporotic according to [facility]) criteria.  Site Region Measured Date Measured Age YA BMD Significant CHANGE T-score DualFemur Total Right 11/08/2016    88.4         -3.4    0.585 g/cm2  AP Spine  L1-L4       11/08/2016    88.6         -1.4    1.017 g/cm2  World Health Organization [facility]) criteria for post-menopausal, Caucasian Women: Normal       T-score at or above -1 SD Osteopenia   T-score between -1 and -2.5 SD Osteoporosis T-score at or below -2.5 SD  RECOMMENDATION: [place] recommends that FDA-approved medical therapies be considered in postmenopausal women and men age 33 or older with a:  1. Hip or vertebral (clinical or morphometric) fracture. 2. T-score of <-2.5 at the spine or hip. 3. Ten-year fracture probability by FRAX of 3% or greater for hip fracture or 20% or greater for major osteoporotic fracture.  All treatment decisions require clinical judgment and consideration of individual patient factors, including patient preferences, co-morbidities, previous drug use, risk factors not captured in the FRAX model (e.g. falls, vitamin D deficiency, increased bone turnover, interval significant decline in bone density) and possible under - or over-estimation of fracture risk by FRAX.  All patients should ensure an adequate intake of dietary calcium (1200 mg/d) and vitamin D (800 IU daily) unless  contraindicated.  FOLLOW-UP: People with diagnosed cases of osteoporosis or at high risk for fracture should have regular bone mineral density tests. For patients eligible for Medicare, routine testing is allowed once every 2 years. The testing frequency can be increased to one year for patients who have rapidly progressing disease, those who are receiving or discontinuing medical therapy to restore bone mass, or have additional risk factors.   Daughter and great-granddaughter at [facility] during [place].  Patient Care Team    Relationship Specialty Notifications Start End  Esaw Grandchild, NP PCP - General Family Medicine  04/14/16     Patient Active Problem List   Diagnosis Date Noted  . Age-related osteoporosis without current pathological fracture 11/16/2016  . Cough in adult 11/16/2016  . Healthcare maintenance 10/14/2016  . Diaper dermatitis 06/10/2016  . Antibiotic-induced yeast infection 06/10/2016  . Hearing loss secondary to cerumen impaction, bilateral 06/10/2016  . Chronic bilateral low back pain 04/19/2016  . Chronic neck pain 04/19/2016  . Medication refill 04/14/2016  . Joint laxity of right knee 04/14/2016  . Hypothyroidism 02/04/2016  . Vaginitis 07/19/2011  . Upper back pain 08/01/2009  . Transient cerebral ischemia 09/22/2007  . Dementia with behavioral disturbance 10/07/2006  . Essential hypertension 10/07/2006  . COPD (chronic obstructive pulmonary disease) ([place]) 10/07/2006  . GERD 10/07/2006     Past Medical History:  Diagnosis Date  . COPD (chronic obstructive pulmonary disease) ([place])   . Dementia   . Dementia   .  GERD (gastroesophageal reflux disease)   . Heart attack ([place]) 1998  . Hypertension   . Stroke ([place])   . Thyroid disease      Past Surgical History:  Procedure Laterality Date  . BREAST LUMPECTOMY  1995   Left Breast  . DILATION AND CURETTAGE OF UTERUS  1975  . GALLBLADDER SURGERY  1993     Family History  Problem Relation Age of  Onset  . Hypertension Mother   . Stroke Mother      History  Drug Use No     History  Alcohol Use No     History  Smoking Status  . Former Smoker  Smokeless Tobacco  . Never Used     Outpatient Encounter Prescriptions as of 11/16/2016  Medication Sig  . Acetaminophen (TYLENOL 8 HOUR PO) Take by mouth.  [name] albuterol (PROVENTIL HFA;VENTOLIN HFA) 108 (90 Base) MCG/ACT inhaler Inhale 2 puffs into the lungs every 6 (six) hours as needed.  [name] albuterol (PROVENTIL) (2.5 MG/3ML) 0.083% nebulizer solution Take 3 mLs (2.5 mg total) by nebulization every 6 (six) hours as needed for wheezing or shortness of breath.  [name] amLODipine (NORVASC) 5 MG tablet Take 1 tablet (5 mg total) by mouth daily. (Patient taking differently: Take 2.5 mg by mouth daily. )  . aspirin 81 MG tablet Take 81 mg by mouth daily.  [name] atorvastatin (LIPITOR) 10 MG tablet Take 1 tablet (10 mg total) by mouth daily.  . fexofenadine (ALLEGRA) 60 MG tablet Take 1 tablet (60 mg total) by mouth daily.  [name] FLUoxetine (PROZAC) 10 MG capsule Take 1 capsule (10 mg total) by mouth daily.  . fluticasone (FLONASE) 50 MCG/ACT nasal spray Place 1 spray into both nostrils 2 (two) times daily.  . furosemide (LASIX) 20 MG tablet Take 1 tablet (20 mg total) by mouth daily.  [name] ketoconazole (NIZORAL) 2 % shampoo Apply 1 application topically 2 (two) times a week.  . levothyroxine (SYNTHROID, LEVOTHROID) 50 MCG tablet TAKE ONE (1) TABLET BY MOUTH EVERY DAY  . potassium chloride SA (K-DUR,KLOR-CON) 20 MEQ tablet TAKE 1 TABLET BY MOUTH ONCE DAILY  . risperiDONE (RISPERDAL) 1 MG tablet Take 1 tablet (1 mg total) by mouth 2 (two) times daily.  [name] triamcinolone cream (KENALOG) 0.1 % Apply 1 application topically 2 (two) times daily.  [name] umeclidinium-vilanterol (ANORO ELLIPTA) 62.5-25 MCG/INH AEPB Inhale 1 puff into the lungs daily.  . Vitamin D, Ergocalciferol, (DRISDOL) 50000 units CAPS capsule Take 1 capsule (50,000 Units total) by mouth every 7  (seven) days.  . [DISCONTINUED] benzonatate (TESSALON) 100 MG capsule 2 tablets 3 times a day as needed cough  . [DISCONTINUED] cyclobenzaprine (FLEXERIL) 5 MG tablet One half tab daily at bedtime when necessary (Patient taking differently: 5 mg. One half tab daily at bedtime when necessary)  . [DISCONTINUED] traMADol (ULTRAM) 50 MG tablet Take 1 tablet (50 mg total) by mouth every 8 (eight) hours as needed.  . benzonatate (TESSALON) 100 MG capsule Take 1 capsule (100 mg total) by mouth 3 (three) times daily as needed for cough.   No facility-administered encounter medications on file as of 11/16/2016.     Allergies: Codeine sulfate  Body mass index is 25.89 kg/m.  Blood pressure 113/73, pulse 80, height 5' 4.25" (1.632 m), weight 152 lb (68.9 kg).     Review of Systems  Constitutional: Positive for fatigue. Negative for activity change, appetite change, chills, diaphoresis, fever and unexpected weight change.  Eyes: Positive for visual disturbance.  Respiratory: Negative for cough, chest tightness, shortness of breath, wheezing and stridor.   Cardiovascular: Negative for chest pain, palpitations and leg swelling.  Gastrointestinal: Negative for abdominal distention, abdominal pain, blood in stool, constipation, diarrhea, nausea and vomiting.  Musculoskeletal: Positive for arthralgias, gait problem, neck pain and neck stiffness. Negative for back pain, joint swelling and myalgias.  Neurological: Negative for dizziness and headaches.  Psychiatric/Behavioral: Negative for agitation.       Objective:   Physical Exam  Constitutional: She is oriented to person, place, and time. She appears well-developed and well-nourished. No distress.  HENT:  Right Ear: Decreased hearing is noted.  Left Ear: Decreased hearing is noted.  Cardiovascular: Normal rate, regular rhythm, normal heart sounds and intact distal pulses.   No murmur heard. Pulmonary/Chest: Effort normal and breath sounds  normal. No respiratory distress. She has no wheezes. She has no rales. She exhibits no tenderness.  Neurological: She is alert and oriented to person, place, and time.  Skin: Skin is warm and dry. No rash noted. She is not diaphoretic. No erythema. No pallor.  Psychiatric: She has a normal mood and affect. Her behavior is normal. Judgment and thought content normal.  Speech garbled- which is her baseline  Nursing note and vitals reviewed.         Assessment & Plan:   1. Age-related osteoporosis without current pathological fracture   2. Healthcare maintenance   3. Essential hypertension   4. Dementia with behavioral disturbance, unspecified dementia type   5. Cough in adult     Healthcare maintenance Continue your excellent water intake and heart healthy diet. Please return for labs - 11/2016 (ALT) and 02/2017 (lipids) Regular follow-up in 6 months, sooner if needed. Daughter at [facility] and all questions/concerns addressed at length.  Essential hypertension BP at goal 113/73, HR 80 Continue furosemide 20mg , Amlodipine 2.5mg  daily   Dementia with behavioral disturbance Stable  Age-related osteoporosis without current pathological fracture Discussed at length Bone Density results and benefits/risks of Bisphosphonate therapy. Pt declined bisphosphate medication and requests to tx with Vit d, Calcium and increase daily walking.   Cough in adult Hx of cough-none during OV. One rx of Tessalon sent in if you need prior to your family trip to [place] next month.    FOLLOW-UP:  Return in about 6 months (around 05/17/2017) for Regular Follow Up.

## 2016-11-16 NOTE — Patient Instructions (Signed)
Osteoporosis Osteoporosis is the thinning and loss of density in the bones. Osteoporosis makes the bones more brittle, fragile, and likely to break (fracture). Over time, osteoporosis can cause the bones to become so weak that they fracture after a simple fall. The bones most likely to fracture are the bones in the hip, wrist, and spine. What are the causes? The exact cause is not known. What increases the risk? Anyone can develop osteoporosis. You may be at greater risk if you have a family history of the condition or have poor nutrition. You may also have a higher risk if you are:  Female.  81 years old or older.  A smoker.  Not physically active.  White or Asian.  Slender.  What are the signs or symptoms? A fracture might be the first sign of the disease, especially if it results from a fall or injury that would not usually cause a bone to break. Other signs and symptoms include:  Low back and neck pain.  Stooped posture.  Height loss.  How is this diagnosed? To make a diagnosis, your health care provider may:  Take a medical history.  Perform a physical exam.  Order tests, such as: ? A bone mineral density test. ? A dual-energy X-ray absorptiometry test.  How is this treated? The goal of osteoporosis treatment is to strengthen your bones to reduce your risk of a fracture. Treatment may involve:  Making lifestyle changes, such as: ? Eating a diet rich in calcium. ? Doing weight-bearing and muscle-strengthening exercises. ? Stopping tobacco use. ? Limiting alcohol intake.  Taking medicine to slow the process of bone loss or to increase bone density.  Monitoring your levels of calcium and vitamin D.  Follow these instructions at home:  Include calcium and vitamin D in your diet. Calcium is important for bone health, and vitamin D helps the body absorb calcium.  Perform weight-bearing and muscle-strengthening exercises as directed by your health care  provider.  Do not use any tobacco products, including cigarettes, chewing tobacco, and electronic cigarettes. If you need help quitting, ask your health care provider.  Limit your alcohol intake.  Take medicines only as directed by your health care provider.  Keep all follow-up visits as directed by your health care provider. This is important.  Take precautions at home to lower your risk of falling, such as: ? Keeping rooms well lit and clutter free. ? Installing safety rails on stairs. ? Using rubber mats in the bathroom and other areas that are often wet or slippery. Get help right away if: You fall or injure yourself. This information is not intended to replace advice given to you by your health care provider. Make sure you discuss any questions you have with your health care provider. Document Released: 10/21/2004 Document Revised: 06/16/2015 Document Reviewed: 06/21/2013 Elsevier Interactive Patient Education  2017 [employer]  Neck Exercises Neck exercises can be important for many reasons:  They can help you to improve and maintain flexibility in your neck. This can be especially important as you age.  They can help to make your neck stronger. This can make movement easier.  They can reduce or prevent neck pain.  They may help your upper back.  Ask your health care provider which neck exercises would be best for you. Exercises Neck Press Repeat this exercise 10 times. Do it first thing in the morning and right before bed or as told by your health care provider. 1. Lie on your back on a  firm bed or on the floor with a pillow under your head. 2. Use your neck muscles to push your head down on the pillow and straighten your spine. 3. Hold the position as well as you can. Keep your head facing up and your chin tucked. 4. Slowly count to 5 while holding this position. 5. Relax for a few seconds. Then repeat.  Isometric Strengthening Do a full set of these exercises 2  times a day or as told by your health care provider. 1. Sit in a supportive chair and place your hand on your forehead. 2. Push forward with your head and neck while pushing back with your hand. Hold for 10 seconds. 3. Relax. Then repeat the exercise 3 times. 4. Next, do thesequence again, this time putting your hand against the back of your head. Use your head and neck to push backward against the hand pressure. 5. Finally, do the same exercise on either side of your head, pushing sideways against the pressure of your hand.  Prone Head Lifts Repeat this exercise 5 times. Do this 2 times a day or as told by your health care provider. 1. Lie face-down, resting on your elbows so that your chest and upper back are raised. 2. Start with your head facing downward, near your chest. Position your chin either on or near your chest. 3. Slowly lift your head upward. Lift until you are looking straight ahead. Then continue lifting your head as far back as you can stretch. 4. Hold your head up for 5 seconds. Then slowly lower it to your starting position.  Supine Head Lifts Repeat this exercise 8-10 times. Do this 2 times a day or as told by your health care provider. 1. Lie on your back, bending your knees to point to the ceiling and keeping your feet flat on the floor. 2. Lift your head slowly off the floor, raising your chin toward your chest. 3. Hold for 5 seconds. 4. Relax and repeat.  Scapular Retraction Repeat this exercise 5 times. Do this 2 times a day or as told by your health care provider. 1. Stand with your arms at your sides. Look straight ahead. 2. Slowly pull both shoulders backward and downward until you feel a stretch between your shoulder blades in your upper back. 3. Hold for 10-30 seconds. 4. Relax and repeat.  Contact a health care provider if:  Your neck pain or discomfort gets much worse when you do an exercise.  Your neck pain or discomfort does not improve within 2  hours after you exercise. If you have any of these problems, stop exercising right away. Do not do the exercises again unless your health care provider says that you can. Get help right away if:  You develop sudden, severe neck pain. If this happens, stop exercising right away. Do not do the exercises again unless your health care provider says that you can. Exercises Neck Stretch  Repeat this exercise 3-5 times. 1. Do this exercise while standing or while sitting in a chair. 2. Place your feet flat on the floor, shoulder-width apart. 3. Slowly turn your head to the right. Turn it all the way to the right so you can look over your right shoulder. Do not tilt or tip your head. 4. Hold this position for 10-30 seconds. 5. Slowly turn your head to the left, to look over your left shoulder. 6. Hold this position for 10-30 seconds.  Neck Retraction Repeat this exercise 8-10 times. Do this  3-4 times a day or as told by your health care provider. 1. Do this exercise while standing or while sitting in a sturdy chair. 2. Look straight ahead. Do not bend your neck. 3. Use your fingers to push your chin backward. Do not bend your neck for this movement. Continue to face straight ahead. If you are doing the exercise properly, you will feel a slight sensation in your throat and a stretch at the back of your neck. 4. Hold the stretch for 1-2 seconds. Relax and repeat.  This information is not intended to replace advice given to you by your health care provider. Make sure you discuss any questions you have with your health care provider. Document Released: 12/23/2014 Document Revised: 06/19/2015 Document Reviewed: 07/22/2014 Elsevier Interactive Patient Education  2017 [place] your excellent water intake and heart healthy diet. Continue vit d and start once daily OTC Calcium 1200mg  supplement. Try to stand and walk every 1-2 hrs. Perform neck exercises daily and continue use of heating  pad. One rx of Tessalon sent in if you need prior to your family trip to [place]. Please return for labs - 11/2016 and 02/2017 Regular follow-up in 6 months, sooner if needed. GREAT TO SEE YOU!

## 2016-11-16 NOTE — Assessment & Plan Note (Signed)
BP at goal 113/73, HR 80 Continue furosemide 20mg , Amlodipine 2.5mg  daily

## 2016-11-16 NOTE — Assessment & Plan Note (Signed)
Discussed at length Bone Density results and benefits/risks of Bisphosphonate therapy. Pt declined bisphosphate medication and requests to tx with Vit d, Calcium and increase daily walking.

## 2016-11-18 ENCOUNTER — Encounter: Payer: Self-pay | Admitting: Adult Health

## 2016-11-18 ENCOUNTER — Ambulatory Visit (INDEPENDENT_AMBULATORY_CARE_PROVIDER_SITE_OTHER): Payer: Medicare HMO | Admitting: Adult Health

## 2016-11-18 VITALS — BP 135/76 | HR 75 | Ht 64.25 in | Wt 154.3 lb

## 2016-11-18 DIAGNOSIS — Z8673 Personal history of transient ischemic attack (TIA), and cerebral infarction without residual deficits: Secondary | ICD-10-CM

## 2016-11-18 DIAGNOSIS — G458 Other transient cerebral ischemic attacks and related syndromes: Secondary | ICD-10-CM

## 2016-11-18 DIAGNOSIS — R600 Localized edema: Secondary | ICD-10-CM | POA: Diagnosis not present

## 2016-11-18 DIAGNOSIS — I10 Essential (primary) hypertension: Secondary | ICD-10-CM | POA: Diagnosis not present

## 2016-11-18 DIAGNOSIS — R635 Abnormal weight gain: Secondary | ICD-10-CM

## 2016-11-18 NOTE — Assessment & Plan Note (Addendum)
BP at goal 134//76, HR 75 Currently on Furosemide 20mg  and Amlodipine 2.5mg  daily.

## 2016-11-18 NOTE — Assessment & Plan Note (Addendum)
No deviations from Neurological baseline noted (have seen pt multiple times in last few months). Last contact with Neurology was in 2000s- referral placed.

## 2016-11-18 NOTE — Assessment & Plan Note (Addendum)
Wt today - 154, wt 11/16/16 - 152 Advised to return Monday to re-check weight and possible CXR if increased or dyspnea sx's noted. She denies acute cardiac sx's or dyspnea.

## 2016-11-18 NOTE — Assessment & Plan Note (Signed)
Rest/Ice/Elevate/Compression of R foot Due to pain on lateral knee and edema of R foot- [name] RLE placed.

## 2016-11-18 NOTE — Progress Notes (Signed)
Subjective:    Patient ID: Donna Arias, female    DOB: [birth year], 83 y.o.   MRN: [id number]  HPI:  Donna Arias is awoke this morning with R ankle/foot edema and tenderness this morning.  She denies acute injury/accident prior to onset of sx's. She now denies any pain but reports a slight limp and that the edema is still present.  She denies change in sensation in R foot.   Her daughter shared: Yesterday Donna Arias felt slightly nauseated and then had a large, formed BM without hematochezia.   Per her daughter this occurred prior to all her TIAs that she experienced in 65s and 2000s.  Her daughter is quite concerned that she had a TIA during the night and claims that her mother called out to her during the early morning (that she was able to hear via the baby monitor in Ms. Schollmeyer's bedroom).   Pt denies CP/dyspnea/cough/feeling of weakness or any other acute complaints, other than R foot edema.  Patient Care Team    Relationship Specialty Notifications Start End  Esaw Grandchild, NP PCP - General Family Medicine  04/14/16     Patient Active Problem List   Diagnosis Date Noted  . Weight gain 11/18/2016  . Edema of right foot 11/18/2016  . Age-related osteoporosis without current pathological fracture 11/16/2016  . Cough in adult 11/16/2016  . Healthcare maintenance 10/14/2016  . Diaper dermatitis 06/10/2016  . Antibiotic-induced yeast infection 06/10/2016  . Hearing loss secondary to cerumen impaction, bilateral 06/10/2016  . Chronic bilateral low back pain 04/19/2016  . Chronic neck pain 04/19/2016  . Medication refill 04/14/2016  . Joint laxity of right knee 04/14/2016  . Hypothyroidism 02/04/2016  . Vaginitis 07/19/2011  . Upper back pain 08/01/2009  . Transient cerebral ischemia 09/22/2007  . Dementia with behavioral disturbance 10/07/2006  . Essential hypertension 10/07/2006  . COPD (chronic obstructive pulmonary disease) ([place]) 10/07/2006  . GERD 10/07/2006     Past  Medical History:  Diagnosis Date  . COPD (chronic obstructive pulmonary disease) ([place])   . Dementia   . Dementia   . GERD (gastroesophageal reflux disease)   . Heart attack ([place]) 1998  . Hypertension   . Stroke ([place])   . Thyroid disease      Past Surgical History:  Procedure Laterality Date  . BREAST LUMPECTOMY  1995   Left Breast  . DILATION AND CURETTAGE OF UTERUS  1975  . GALLBLADDER SURGERY  1993     Family History  Problem Relation Age of Onset  . Hypertension Mother   . Stroke Mother      History  Drug Use No     History  Alcohol Use No     History  Smoking Status  . Former Smoker  Smokeless Tobacco  . Never Used     Outpatient Encounter Prescriptions as of 11/18/2016  Medication Sig  . Acetaminophen (TYLENOL 8 HOUR PO) Take by mouth.  [name] albuterol (PROVENTIL HFA;VENTOLIN HFA) 108 (90 Base) MCG/ACT inhaler Inhale 2 puffs into the lungs every 6 (six) hours as needed.  [name] albuterol (PROVENTIL) (2.5 MG/3ML) 0.083% nebulizer solution Take 3 mLs (2.5 mg total) by nebulization every 6 (six) hours as needed for wheezing or shortness of breath.  [name] amLODipine (NORVASC) 5 MG tablet Take 1 tablet (5 mg total) by mouth daily. (Patient taking differently: Take 2.5 mg by mouth daily. )  . aspirin 81 MG tablet Take 81 mg by mouth daily.  [name] atorvastatin (  LIPITOR) 10 MG tablet Take 1 tablet (10 mg total) by mouth daily.  . benzonatate (TESSALON) 100 MG capsule Take 1 capsule (100 mg total) by mouth 3 (three) times daily as needed for cough.  . fexofenadine (ALLEGRA) 60 MG tablet Take 1 tablet (60 mg total) by mouth daily.  [name] FLUoxetine (PROZAC) 10 MG capsule Take 1 capsule (10 mg total) by mouth daily.  . fluticasone (FLONASE) 50 MCG/ACT nasal spray Place 1 spray into both nostrils 2 (two) times daily.  . furosemide (LASIX) 20 MG tablet Take 1 tablet (20 mg total) by mouth daily.  [name] ketoconazole (NIZORAL) 2 % shampoo Apply 1 application topically 2 (two) times a week.    . levothyroxine (SYNTHROID, LEVOTHROID) 50 MCG tablet TAKE ONE (1) TABLET BY MOUTH EVERY DAY  . potassium chloride SA (K-DUR,KLOR-CON) 20 MEQ tablet TAKE 1 TABLET BY MOUTH ONCE DAILY  . risperiDONE (RISPERDAL) 1 MG tablet Take 1 tablet (1 mg total) by mouth 2 (two) times daily.  [name] triamcinolone cream (KENALOG) 0.1 % Apply 1 application topically 2 (two) times daily.  [name] umeclidinium-vilanterol (ANORO ELLIPTA) 62.5-25 MCG/INH AEPB Inhale 1 puff into the lungs daily.  . Vitamin D, Ergocalciferol, (DRISDOL) 50000 units CAPS capsule Take 1 capsule (50,000 Units total) by mouth every 7 (seven) days.   No facility-administered encounter medications on file as of 11/18/2016.     Allergies: Codeine sulfate  Body mass index is 26.28 kg/m.  Blood pressure 135/76, pulse 75, height 5' 4.25" (1.632 m), weight 154 lb 4.8 oz (70 kg).      Review of Systems  Constitutional: Positive for fatigue. Negative for activity change, appetite change, chills, diaphoresis, fever and unexpected weight change.  Respiratory: Negative for cough, chest tightness, shortness of breath, wheezing and stridor.   Cardiovascular: Negative for chest pain, palpitations and leg swelling.  Gastrointestinal: Negative for abdominal pain, blood in stool, diarrhea, nausea and vomiting.  Musculoskeletal: Positive for arthralgias and gait problem. Negative for myalgias.  Neurological: Negative for dizziness, tremors, weakness, light-headedness, numbness and headaches.       Baseline garbled, loud speech  Psychiatric/Behavioral: Negative for dysphoric mood, self-injury, sleep disturbance and suicidal ideas. The patient is not nervous/anxious.        Objective:   Physical Exam  Constitutional: She is oriented to person, place, and time. She appears well-developed and well-nourished. No distress.  Cardiovascular: Normal rate, regular rhythm, normal heart sounds and intact distal pulses.   No murmur heard. Pulmonary/Chest:  Effort normal and breath sounds normal. No respiratory distress. She has no wheezes. She has no rales. She exhibits no tenderness.  Musculoskeletal: She exhibits edema and tenderness.       Right knee: She exhibits bony tenderness. She exhibits normal range of motion and no swelling. Tenderness found. Lateral joint line tenderness noted. No patellar tendon tenderness noted.       Right ankle: She exhibits swelling. She exhibits normal range of motion and normal pulse. Achilles tendon exhibits no pain.       Left ankle: She exhibits normal range of motion, no swelling and normal pulse.       Feet:  1+ edema around base of toes and on lateral R foot. No palpable mass/excessive warmth noted over R gastrocnemius.   Neurological: She is alert and oriented to person, place, and time. No cranial nerve deficit. She exhibits normal muscle tone. Coordination normal.  Slow, steady, guarded gait-which is her baseline. She was able to correctly state where she was,the  year and why she was in the clinic today.   Skin: She is not diaphoretic.  Psychiatric: She has a normal mood and affect. Her behavior is normal. Judgment and thought content normal.          Assessment & Plan:   1. Hx of transient ischemic attack (TIA)   2. Edema of right lower extremity   3. Other specified transient cerebral ischemias   4. Essential hypertension   5. Weight gain   6. Edema of right foot     Transient cerebral ischemia No deviations from Neurological baseline noted (have seen pt multiple times in last few months). Last contact with Neurology was in 2000s- referral placed.  Essential hypertension BP at goal 134//76, HR 75 Currently on Furosemide 20mg  and Amlodipine 2.5mg  daily.    Weight gain Wt today - 154, wt 11/16/16 - 152 Advised to return Monday to re-check weight and possible CXR if increased or dyspnea sx's noted. She denies acute cardiac sx's or dyspnea.  Edema of right  foot Rest/Ice/Elevate/Compression of R foot Due to pain on lateral knee and edema of R foot- [name] RLE placed.    FOLLOW-UP:  Return in about 4 days (around 11/22/2016).

## 2016-11-18 NOTE — Patient Instructions (Addendum)
RICE for Routine Care of Injuries Theroutine careofmanyinjuriesincludes rest, ice, compression, and elevation (RICE therapy). RICE therapy is often recommended for injuries to soft tissues, such as a muscle strain, ligament injuries, bruises, and overuse injuries. It can also be used for some bony injuries. Using RICE therapy can help to relieve pain, lessen swelling, and enable your body to heal. Rest Rest is required to allow your body to heal. This usually involves reducing your normal activities and avoiding use of the injured part of your body. Generally, you can return to your normal activities when you are comfortable and have been given permission by your health care provider. Ice  Icing your injury helps to keep the swelling down, and it lessens pain. Do not apply ice directly to your skin.  Put ice in a plastic bag.  Place a towel between your skin and the bag.  Leave the ice on for 20 minutes, 2-3 times a day.  Do this for as long as you are directed by your health care provider. Compression Compression means putting pressure on the injured area. Compression helps to keep swelling down, gives support, and helps with discomfort. Compression may be done with an elastic bandage. If an elastic bandage has been applied, follow these general tips:  Remove and reapply the bandage every 3-4 hours or as directed by your health care provider.  Make sure the bandage is not wrapped too tightly, because this can cut off circulation. If part of your body beyond the bandage becomes blue, numb, cold, swollen, or more painful, your bandage is most likely too tight. If this occurs, remove your bandage and reapply it more loosely.  See your health care provider if the bandage seems to be making your problems worse rather than better.  Elevation  Elevation means keeping the injured area raised. This helps to lessen swelling and decrease pain. If possible, your injured area should be elevated at  or above the level of your heart or the center of your chest. When should I seek medical care?  If your pain and swelling continue.  If your symptoms are getting worse rather than improving. These symptoms may indicate that further evaluation or further X-rays are needed. Sometimes, X-rays may not show a small broken bone (fracture) until a number of days later. Make a follow-up appointment with your health care provider. When should I seek immediate medical care?  If you have sudden severe pain at or below the area of your injury.  If you have redness or increased swelling around your injury.  If you have tingling or numbness at or below the area of your injury that does not improve after you remove the elastic bandage. This information is not intended to replace advice given to you by your health care provider. Make sure you discuss any questions you have with your health care provider. Document Released: 04/25/2000 Document Revised: 06/17/2015 Document Reviewed: 12/19/2013 Elsevier Interactive Patient Education  2017 [employer]  Right lower extremity Ultrasound order placed. Neurology referral placed. Please follow-up Monday Oct 29,2018 and we will re-check weight and right foot. Please call with any questions/concerns.

## 2016-11-22 ENCOUNTER — Encounter: Payer: Self-pay | Admitting: Neurology

## 2016-11-22 ENCOUNTER — Ambulatory Visit (INDEPENDENT_AMBULATORY_CARE_PROVIDER_SITE_OTHER): Payer: Medicare HMO | Admitting: Neurology

## 2016-11-22 ENCOUNTER — Encounter: Payer: Self-pay | Admitting: Adult Health

## 2016-11-22 ENCOUNTER — Ambulatory Visit (INDEPENDENT_AMBULATORY_CARE_PROVIDER_SITE_OTHER): Payer: Medicare HMO | Admitting: Adult Health

## 2016-11-22 VITALS — BP 105/71 | HR 96 | Wt 150.5 lb

## 2016-11-22 VITALS — BP 137/81 | HR 76 | Wt 152.0 lb

## 2016-11-22 DIAGNOSIS — Z0000 Encounter for general adult medical examination without abnormal findings: Secondary | ICD-10-CM

## 2016-11-22 DIAGNOSIS — R471 Dysarthria and anarthria: Secondary | ICD-10-CM | POA: Diagnosis not present

## 2016-11-22 DIAGNOSIS — E785 Hyperlipidemia, unspecified: Secondary | ICD-10-CM | POA: Diagnosis not present

## 2016-11-22 DIAGNOSIS — F419 Anxiety disorder, unspecified: Secondary | ICD-10-CM | POA: Insufficient documentation

## 2016-11-22 DIAGNOSIS — R600 Localized edema: Secondary | ICD-10-CM

## 2016-11-22 DIAGNOSIS — I951 Orthostatic hypotension: Secondary | ICD-10-CM

## 2016-11-22 NOTE — Patient Instructions (Signed)
RICE for Routine Care of Injuries Theroutine careofmanyinjuriesincludes rest, ice, compression, and elevation (RICE therapy). RICE therapy is often recommended for injuries to soft tissues, such as a muscle strain, ligament injuries, bruises, and overuse injuries. It can also be used for some bony injuries. Using RICE therapy can help to relieve pain, lessen swelling, and enable your body to heal. Rest Rest is required to allow your body to heal. This usually involves reducing your normal activities and avoiding use of the injured part of your body. Generally, you can return to your normal activities when you are comfortable and have been given permission by your health care provider. Ice  Icing your injury helps to keep the swelling down, and it lessens pain. Do not apply ice directly to your skin.  Put ice in a plastic bag.  Place a towel between your skin and the bag.  Leave the ice on for 20 minutes, 2-3 times a day.  Do this for as long as you are directed by your health care provider. Compression Compression means putting pressure on the injured area. Compression helps to keep swelling down, gives support, and helps with discomfort. Compression may be done with an elastic bandage. If an elastic bandage has been applied, follow these general tips:  Remove and reapply the bandage every 3-4 hours or as directed by your health care provider.  Make sure the bandage is not wrapped too tightly, because this can cut off circulation. If part of your body beyond the bandage becomes blue, numb, cold, swollen, or more painful, your bandage is most likely too tight. If this occurs, remove your bandage and reapply it more loosely.  See your health care provider if the bandage seems to be making your problems worse rather than better.  Elevation  Elevation means keeping the injured area raised. This helps to lessen swelling and decrease pain. If possible, your injured area should be elevated at  or above the level of your heart or the center of your chest. When should I seek medical care?  If your pain and swelling continue.  If your symptoms are getting worse rather than improving. These symptoms may indicate that further evaluation or further X-rays are needed. Sometimes, X-rays may not show a small broken bone (fracture) until a number of days later. Make a follow-up appointment with your health care provider. When should I seek immediate medical care?  If you have sudden severe pain at or below the area of your injury.  If you have redness or increased swelling around your injury.  If you have tingling or numbness at or below the area of your injury that does not improve after you remove the elastic bandage. This information is not intended to replace advice given to you by your health care provider. Make sure you discuss any questions you have with your health care provider. Document Released: 04/25/2000 Document Revised: 06/17/2015 Document Reviewed: 12/19/2013 Elsevier Interactive Patient Education  2017 [place].  Please keep Neurology appt this afternoon and [name] appt tomorrow. Continue to ice and elevate as needed. Please call office with any questions/concerns. NICE TO SEE YOU!

## 2016-11-22 NOTE — Assessment & Plan Note (Signed)
Wt stable at 150 today She denies dyspnea at rest Please keep Neurology appt this afternoon

## 2016-11-22 NOTE — Progress Notes (Signed)
Subjective:    Patient ID: Donna Arias, female    DOB: 05-04-1926, 83 y.o.   MRN: [id number]  HPI: 10/25/20108 OV Notes: Donna Arias is awoke this morning with R ankle/foot edema and tenderness this morning.  She denies acute injury/accident prior to onset of sx's. She now denies any pain but reports a slight limp and that the edema is still present.  She denies change in sensation in R foot.   Her daughter shared: Yesterday Donna Arias felt slightly nauseated and then had a large, formed BM without hematochezia.   Per her daughter this occurred prior to all her TIAs that she experienced in 66s and 2000s.  Her daughter is quite concerned that she had a TIA during the night and claims that her mother called out to her during the early morning (that she was able to hear via the baby monitor in Donna Arias's bedroom).   Pt denies CP/dyspnea/cough/feeling of weakness or any other acute complaints, other than R foot edema.  Today's OV Notes: Donna Arias is here for f/u for edema of R foot.  She has been using the RICE techniques and denies any current pain and reports improved mobility and reduced edema.  She denies CP/dyspnea at rest/change in memory/speech.  Her daughter feels that she is behaving normally and that her gait has improved.   Patient Care Team    Relationship Specialty Notifications Start End  Esaw Grandchild, NP PCP - General Family Medicine  04/14/16     Patient Active Problem List   Diagnosis Date Noted  . Weight gain 11/18/2016  . Edema of right foot 11/18/2016  . Age-related osteoporosis without current pathological fracture 11/16/2016  . Cough in adult 11/16/2016  . Healthcare maintenance 10/14/2016  . Diaper dermatitis 06/10/2016  . Antibiotic-induced yeast infection 06/10/2016  . Hearing loss secondary to cerumen impaction, bilateral 06/10/2016  . Chronic bilateral low back pain 04/19/2016  . Chronic neck pain 04/19/2016  . Medication refill 04/14/2016  . Joint  laxity of right knee 04/14/2016  . Hypothyroidism 02/04/2016  . Vaginitis 07/19/2011  . Upper back pain 08/01/2009  . Transient cerebral ischemia 09/22/2007  . Dementia with behavioral disturbance 10/07/2006  . Essential hypertension 10/07/2006  . COPD (chronic obstructive pulmonary disease) ([place]) 10/07/2006  . GERD 10/07/2006     Past Medical History:  Diagnosis Date  . COPD (chronic obstructive pulmonary disease) ([place])   . Dementia   . Dementia   . GERD (gastroesophageal reflux disease)   . Heart attack ([employer]) 1998  . Hypertension   . Stroke ([place])   . Thyroid disease      Past Surgical History:  Procedure Laterality Date  . BREAST LUMPECTOMY  1995   Left Breast  . DILATION AND CURETTAGE OF UTERUS  1975  . GALLBLADDER SURGERY  1993     Family History  Problem Relation Age of Onset  . Hypertension Mother   . Stroke Mother      History  Drug Use No     History  Alcohol Use No     History  Smoking Status  . Former Smoker  Smokeless Tobacco  . Never Used     Outpatient Encounter Prescriptions as of 11/22/2016  Medication Sig  . Acetaminophen (TYLENOL 8 HOUR PO) Take by mouth.  [name] albuterol (PROVENTIL HFA;VENTOLIN HFA) 108 (90 Base) MCG/ACT inhaler Inhale 2 puffs into the lungs every 6 (six) hours as needed.  [name] albuterol (PROVENTIL) (2.5 MG/3ML) 0.083% nebulizer solution  Take 3 mLs (2.5 mg total) by nebulization every 6 (six) hours as needed for wheezing or shortness of breath.  [name] amLODipine (NORVASC) 5 MG tablet Take 1 tablet (5 mg total) by mouth daily. (Patient taking differently: Take 2.5 mg by mouth daily. )  . aspirin 81 MG tablet Take 81 mg by mouth daily.  [name] atorvastatin (LIPITOR) 10 MG tablet Take 1 tablet (10 mg total) by mouth daily.  . benzonatate (TESSALON) 100 MG capsule Take 1 capsule (100 mg total) by mouth 3 (three) times daily as needed for cough.  . fexofenadine (ALLEGRA) 60 MG tablet Take 1 tablet (60 mg total) by mouth daily.  [name]  FLUoxetine (PROZAC) 10 MG capsule Take 1 capsule (10 mg total) by mouth daily.  . fluticasone (FLONASE) 50 MCG/ACT nasal spray Place 1 spray into both nostrils 2 (two) times daily.  . furosemide (LASIX) 20 MG tablet Take 1 tablet (20 mg total) by mouth daily.  [name] ketoconazole (NIZORAL) 2 % shampoo Apply 1 application topically 2 (two) times a week.  . levothyroxine (SYNTHROID, LEVOTHROID) 50 MCG tablet TAKE ONE (1) TABLET BY MOUTH EVERY DAY  . potassium chloride SA (K-DUR,KLOR-CON) 20 MEQ tablet TAKE 1 TABLET BY MOUTH ONCE DAILY  . risperiDONE (RISPERDAL) 1 MG tablet Take 1 tablet (1 mg total) by mouth 2 (two) times daily.  [name] triamcinolone cream (KENALOG) 0.1 % Apply 1 application topically 2 (two) times daily.  [name] umeclidinium-vilanterol (ANORO ELLIPTA) 62.5-25 MCG/INH AEPB Inhale 1 puff into the lungs daily.  . Vitamin D, Ergocalciferol, (DRISDOL) 50000 units CAPS capsule Take 1 capsule (50,000 Units total) by mouth every 7 (seven) days.   No facility-administered encounter medications on file as of 11/22/2016.     Allergies: Codeine sulfate  Body mass index is 25.63 kg/m.  Blood pressure 105/71, pulse 96, weight 150 lb 8 oz (68.3 kg).   Patient Care Team    Relationship Specialty Notifications Start End  Esaw Grandchild, NP PCP - General Family Medicine  04/14/16     Patient Active Problem List   Diagnosis Date Noted  . Weight gain 11/18/2016  . Edema of right foot 11/18/2016  . Age-related osteoporosis without current pathological fracture 11/16/2016  . Cough in adult 11/16/2016  . Healthcare maintenance 10/14/2016  . Diaper dermatitis 06/10/2016  . Antibiotic-induced yeast infection 06/10/2016  . Hearing loss secondary to cerumen impaction, bilateral 06/10/2016  . Chronic bilateral low back pain 04/19/2016  . Chronic neck pain 04/19/2016  . Medication refill 04/14/2016  . Joint laxity of right knee 04/14/2016  . Hypothyroidism 02/04/2016  . Vaginitis 07/19/2011  . Upper  back pain 08/01/2009  . Transient cerebral ischemia 09/22/2007  . Dementia with behavioral disturbance 10/07/2006  . Essential hypertension 10/07/2006  . COPD (chronic obstructive pulmonary disease) ([place]) 10/07/2006  . GERD 10/07/2006     Past Medical History:  Diagnosis Date  . COPD (chronic obstructive pulmonary disease) ([place])   . Dementia   . Dementia   . GERD (gastroesophageal reflux disease)   . Heart attack ([place]) 1998  . Hypertension   . Stroke ([place])   . Thyroid disease      Past Surgical History:  Procedure Laterality Date  . BREAST LUMPECTOMY  1995   Left Breast  . DILATION AND CURETTAGE OF UTERUS  1975  . GALLBLADDER SURGERY  1993     Family History  Problem Relation Age of Onset  . Hypertension Mother   . Stroke Mother  History  Drug Use No     History  Alcohol Use No     History  Smoking Status  . Former Smoker  Smokeless Tobacco  . Never Used     Outpatient Encounter Prescriptions as of 11/22/2016  Medication Sig  . Acetaminophen (TYLENOL 8 HOUR PO) Take by mouth.  [name] albuterol (PROVENTIL HFA;VENTOLIN HFA) 108 (90 Base) MCG/ACT inhaler Inhale 2 puffs into the lungs every 6 (six) hours as needed.  [name] albuterol (PROVENTIL) (2.5 MG/3ML) 0.083% nebulizer solution Take 3 mLs (2.5 mg total) by nebulization every 6 (six) hours as needed for wheezing or shortness of breath.  [name] amLODipine (NORVASC) 5 MG tablet Take 1 tablet (5 mg total) by mouth daily. (Patient taking differently: Take 2.5 mg by mouth daily. )  . aspirin 81 MG tablet Take 81 mg by mouth daily.  [name] atorvastatin (LIPITOR) 10 MG tablet Take 1 tablet (10 mg total) by mouth daily.  . benzonatate (TESSALON) 100 MG capsule Take 1 capsule (100 mg total) by mouth 3 (three) times daily as needed for cough.  . fexofenadine (ALLEGRA) 60 MG tablet Take 1 tablet (60 mg total) by mouth daily.  [name] FLUoxetine (PROZAC) 10 MG capsule Take 1 capsule (10 mg total) by mouth daily.  . fluticasone  (FLONASE) 50 MCG/ACT nasal spray Place 1 spray into both nostrils 2 (two) times daily.  . furosemide (LASIX) 20 MG tablet Take 1 tablet (20 mg total) by mouth daily.  [name] ketoconazole (NIZORAL) 2 % shampoo Apply 1 application topically 2 (two) times a week.  . levothyroxine (SYNTHROID, LEVOTHROID) 50 MCG tablet TAKE ONE (1) TABLET BY MOUTH EVERY DAY  . potassium chloride SA (K-DUR,KLOR-CON) 20 MEQ tablet TAKE 1 TABLET BY MOUTH ONCE DAILY  . risperiDONE (RISPERDAL) 1 MG tablet Take 1 tablet (1 mg total) by mouth 2 (two) times daily.  [name] triamcinolone cream (KENALOG) 0.1 % Apply 1 application topically 2 (two) times daily.  [name] umeclidinium-vilanterol (ANORO ELLIPTA) 62.5-25 MCG/INH AEPB Inhale 1 puff into the lungs daily.  . Vitamin D, Ergocalciferol, (DRISDOL) 50000 units CAPS capsule Take 1 capsule (50,000 Units total) by mouth every 7 (seven) days.   No facility-administered encounter medications on file as of 11/22/2016.     Allergies: Codeine sulfate  Body mass index is 25.63 kg/m.  Blood pressure 105/71, pulse 96, weight 150 lb 8 oz (68.3 kg).      Review of Systems  Constitutional: Positive for fatigue. Negative for activity change, appetite change, chills, diaphoresis, fever and unexpected weight change.  Respiratory: Negative for cough, chest tightness, shortness of breath, wheezing and stridor.   Cardiovascular: Negative for chest pain, palpitations and leg swelling.  Gastrointestinal: Negative for abdominal pain, blood in stool, diarrhea, nausea and vomiting.  Musculoskeletal: Positive for arthralgias and gait problem. Negative for myalgias.  Neurological: Negative for dizziness, tremors, weakness, light-headedness, numbness and headaches.       Baseline garbled, loud speech  Psychiatric/Behavioral: Negative for dysphoric mood, self-injury, sleep disturbance and suicidal ideas. The patient is not nervous/anxious.        Objective:   Physical Exam  Constitutional: She is  oriented to person, place, and time. She appears well-developed and well-nourished. No distress.  Cardiovascular: Normal rate, regular rhythm, normal heart sounds and intact distal pulses.   No murmur heard. Pulmonary/Chest: Effort normal and breath sounds normal. No respiratory distress. She has no wheezes. She has no rales. She exhibits no tenderness.  Musculoskeletal: She exhibits edema and tenderness.  Right knee: She exhibits bony tenderness. She exhibits normal range of motion and no swelling. Tenderness found. Lateral joint line tenderness noted. No patellar tendon tenderness noted.       Right ankle: She exhibits swelling. She exhibits normal range of motion and normal pulse. Achilles tendon exhibits no pain.       Left ankle: She exhibits normal range of motion, no swelling and normal pulse.       Feet:  Trace edema around base of toes and on lateral R foot. No palpable mass/excessive warmth noted over R gastrocnemius.   Neurological: She is alert and oriented to person, place, and time. No cranial nerve deficit. She exhibits normal muscle tone. Coordination normal.  Slow, steady, guarded gait-which is her baseline. She was able to correctly state where she was,the year and why she was in the clinic today.   Skin: Skin is warm and dry. No rash noted. She is not diaphoretic. No erythema. No pallor.  Psychiatric: She has a normal mood and affect. Her behavior is normal. Judgment and thought content normal.          Assessment & Plan:   1. Edema of right foot   2. Healthcare maintenance     Edema of right foot Please keep [name] appt tomorrow. Continue to ice and elevate as needed. Please call office with any questions/concerns.  Healthcare maintenance Wt stable at 150 today She denies dyspnea at rest Please keep Neurology appt this afternoon    FOLLOW-UP:  Return if symptoms worsen or fail to improve.

## 2016-11-22 NOTE — Progress Notes (Signed)
NEUROLOGY CLINIC NEW PATIENT NOTE  NAME: Donna Arias DOB: Aug 05, 1926 REFERRING PHYSICIAN: Esaw Grandchild, NP  I saw Donna Arias as a new consult in the neurovascular clinic today regarding  Chief Complaint  Patient presents with  . New Patient (Initial Visit)    Referral from [name] for possible neurological sympts, saw Dr. [name] in 1995 for stroke   .  HPI: Donna Arias is a 83 y.o. female with PMH of HTN, HLD, anxiety, "TIAs" who presents as a new patient for history of "TIAs".   Daughter stated that pt in 1990s to 2000s had several episodes of sudden onset lightheadedness, feeling of passing out and hot, diffuse diaphoresis, looks really sick, needs to sit down or lie down. Lasting a few minutes and resolved. It has happened 3 times since she lives with daughter since 10. Last Wednesday she was in bathroom and had again hot feeling, nauseous, daughter was afraid of another "TIA". She went to see her PCP and was referred over here.   Pt has been following with PCP for right leg cramps with braces, intermittent swelling of right leg, will have LE venous doppler tomorrow. Still has right knee pain, on brace at night. Able to walk with cane or without device for short distance. She is on risperdol for behavior issue for many years, so far tolerating well.   Past Medical History:  Diagnosis Date  . COPD (chronic obstructive pulmonary disease) ([place])   . Dementia   . Dementia   . GERD (gastroesophageal reflux disease)   . Heart attack ([place]) 1998  . Hypertension   . Stroke ([place])   . Thyroid disease    Past Surgical History:  Procedure Laterality Date  . BREAST LUMPECTOMY  1995   Left Breast  . DILATION AND CURETTAGE OF UTERUS  1975  . GALLBLADDER SURGERY  1993   Family History  Problem Relation Age of Onset  . Hypertension Mother   . Stroke Mother   . Stroke Sister   . Stroke Sister   . Stroke Sister    Current Outpatient Prescriptions  Medication  Sig Dispense Refill  . Acetaminophen (TYLENOL 8 HOUR PO) Take by mouth.    [name] albuterol (PROVENTIL HFA;VENTOLIN HFA) 108 (90 Base) MCG/ACT inhaler Inhale 2 puffs into the lungs every 6 (six) hours as needed. 1 Inhaler 0  . albuterol (PROVENTIL) (2.5 MG/3ML) 0.083% nebulizer solution Take 3 mLs (2.5 mg total) by nebulization every 6 (six) hours as needed for wheezing or shortness of breath. 75 mL 12  . amLODipine (NORVASC) 5 MG tablet Take 1 tablet (5 mg total) by mouth daily. (Patient taking differently: Take 2.5 mg by mouth daily. ) 90 tablet 3  . aspirin 81 MG tablet Take 81 mg by mouth daily.    [name] atorvastatin (LIPITOR) 10 MG tablet Take 1 tablet (10 mg total) by mouth daily. 90 tablet 1  . benzonatate (TESSALON) 100 MG capsule Take 1 capsule (100 mg total) by mouth 3 (three) times daily as needed for cough. 20 capsule 0  . Calcium Carbonate-Vit D-Min (CALCIUM 1200 PO) Take by mouth.    . fexofenadine (ALLEGRA) 60 MG tablet Take 1 tablet (60 mg total) by mouth daily. 90 tablet 1  . FLUoxetine (PROZAC) 10 MG capsule Take 1 capsule (10 mg total) by mouth daily. 90 capsule 2  . fluticasone (FLONASE) 50 MCG/ACT nasal spray Place 1 spray into both nostrils 2 (two) times daily. 16 g 6  .  furosemide (LASIX) 20 MG tablet Take 1 tablet (20 mg total) by mouth daily. 90 tablet 3  . ketoconazole (NIZORAL) 2 % shampoo Apply 1 application topically 2 (two) times a week. 120 mL 1  . levothyroxine (SYNTHROID, LEVOTHROID) 50 MCG tablet TAKE ONE (1) TABLET BY MOUTH EVERY DAY 90 tablet 3  . potassium chloride SA (K-DUR,KLOR-CON) 20 MEQ tablet TAKE 1 TABLET BY MOUTH ONCE DAILY 90 tablet 3  . risperiDONE (RISPERDAL) 1 MG tablet Take 1 tablet (1 mg total) by mouth 2 (two) times daily. 200 tablet 3  . triamcinolone cream (KENALOG) 0.1 % Apply 1 application topically 2 (two) times daily. 30 g 1  . umeclidinium-vilanterol (ANORO ELLIPTA) 62.5-25 MCG/INH AEPB Inhale 1 puff into the lungs daily. 1 each 3  . Vitamin D,  Ergocalciferol, (DRISDOL) 50000 units CAPS capsule Take 1 capsule (50,000 Units total) by mouth every 7 (seven) days. 16 capsule 0   No current facility-administered medications for this visit.    Allergies  Allergen Reactions  . Codeine Sulfate     REACTION: unspecified   Social History   Social History  . Marital status: Widowed    Spouse name: N/A  . Number of children: N/A  . Years of education: N/A   Occupational History  . RETIRED    Social History Main Topics  . Smoking status: Former Research scientist (life sciences)  . Smokeless tobacco: Never Used  . Alcohol use No  . Drug use: No  . Sexual activity: No   Other Topics Concern  . Not on file   Social History Narrative   CB x 1      Regular exercise - NO    Review of Systems Full 14 system review of systems performed and notable only for those listed, all others are neg:  Constitutional:   Cardiovascular:  Ear/Nose/Throat:   Skin:  Eyes:   Respiratory:  SOB Gastroitestinal:   Genitourinary:  Hematology/Lymphatic:   Endocrine: feeling hot Musculoskeletal:  Cramps, aching muscles Allergy/Immunology:   Neurological:  HA Psychiatric:  Sleep: snoring   Physical Exam  Vitals:   11/22/16 1316  BP: 137/81  Pulse: 76   Lying 128/70-75, sitting 112/69 - 74, standing 98/61-79  General - Well nourished, well developed, in no apparent distress.  Ophthalmologic - Sharp disc margins OU.  Cardiovascular - Regular rate and rhythm with no murmur.   Mental Status -  Level of arousal and orientation to month, place, and person were intact, wrong on year. Language including expression, naming, repetition, comprehension was assessed and found intact. Moderate dysarthria with denture.  Fund of Knowledge was assessed and was intact.  Cranial Nerves II - XII - II - Visual field intact OU. III, IV, VI - Extraocular movements intact. V - Facial sensation intact bilaterally. VII - Facial movement intact bilaterally. VIII - Hearing &  vestibular intact bilaterally. X - Palate elevates symmetrically, moderate dysarthria due to denture XI - Chin turning & shoulder shrug intact bilaterally. XII - Tongue protrusion intact.  Motor Strength - The patient's strength was normal in all extremities and pronator drift was absent.  Bulk was normal and fasciculations were absent.   Motor Tone - Muscle tone was assessed at the neck and appendages and was normal.  Reflexes - The patient's reflexes were normal in all extremities and she had no pathological reflexes.  Sensory - Light touch, temperature/pinprick were assessed and were normal.    Coordination - The patient had normal movements in the hands and feet with  no ataxia or dysmetria.  Tremor was absent.  Gait and Station - The patient's transfers, posture, gait, station, and turns were observed as normal.   Imaging none  Lab Review Component     Latest Ref Rng & Units 10/25/2016  Cholesterol, Total     100 - 199 mg/dL 234 (H)  Triglycerides     0 - 149 mg/dL 108  HDL Cholesterol     >39 mg/dL 63  VLDL Cholesterol Cal     5 - 40 mg/dL 22  LDL (calc)     0 - 99 mg/dL 149 (H)  Total CHOL/HDL Ratio     0.0 - 4.4 ratio 3.7  Hemoglobin A1C     4.8 - 5.6 % 5.5  Est. average glucose Bld gHb Est-mCnc     mg/dL 111  Vitamin B12     232 - 1,245 pg/mL 321  TSH     0.450 - 4.500 uIU/mL 4.340     Assessment and Plan:   In summary, JAICE LAGUE is a 83 y.o. female with PMH of HTN, HLD, anxiety, "TIAs" who presents as a new patient for history of "TIAs". "TIAs" were episodes of sudden onset lightheadedness, feeling of passing out and hot, diffuse diaphoresis, looks really sick, needs to sit down or lie down. Lasting a few minutes and resolved, typical for near syncope. Her orthostatic BP dropped 30 from lying to standing, consistent with the diagnosis.    - continue ASA and lipitor for stroke prevention - avoid dehydration. Stand up slowly and avoid to stand too long -  check BP if any further episode occurs. When it happens, sit down or lie down to get better - may consider EEG to rule out seizure if needed later - check BP at home and record  - Follow up with your primary care physician for stroke risk factor modification. Recommend maintain blood pressure goal <130/80, diabetes with hemoglobin A1c goal below 7.0% and lipids with LDL cholesterol goal below 70 mg/dL.  - follow up in 3 months.   Thank you very much for the opportunity to participate in the care of this patient.  Please do not hesitate to call if any questions or concerns arise.  No orders of the defined types were placed in this encounter.   Meds ordered this encounter  Medications  . Calcium Carbonate-Vit D-Min (CALCIUM 1200 PO)    Sig: Take by mouth.    Patient Instructions  - continue ASA and lipitor for stroke prevention - avoid dehydration. Stand up slowly and avoid to stand too long - use brace for long distance walking  - check BP if any further episode occurs. When it happens, sit down or lie down to get better - may consider EEG to rule out seizure if needed later - check BP at home and record and may try lying and sitting and standing for monitoring - Follow up with your primary care physician for stroke risk factor modification. Recommend maintain blood pressure goal <130/80, diabetes with hemoglobin A1c goal below 7.0% and lipids with LDL cholesterol goal below 70 mg/dL.  - follow up in 3 months.    Rosalin Hawking, MD PhD [facility] [address], [place], [place] [address] [phone]

## 2016-11-22 NOTE — Assessment & Plan Note (Addendum)
Please keep [name] appt tomorrow. Continue to ice and elevate as needed. Please call office with any questions/concerns.

## 2016-11-22 NOTE — Patient Instructions (Signed)
-   continue ASA and lipitor for stroke prevention - avoid dehydration. Stand up slowly and avoid to stand too long - use brace for long distance walking  - check BP if any further episode occurs. When it happens, sit down or lie down to get better - may consider EEG to rule out seizure if needed later - check BP at home and record and may try lying and sitting and standing for monitoring - Follow up with your primary care physician for stroke risk factor modification. Recommend maintain blood pressure goal <130/80, diabetes with hemoglobin A1c goal below 7.0% and lipids with LDL cholesterol goal below 70 mg/dL.  - follow up in 3 months.

## 2016-11-23 ENCOUNTER — Ambulatory Visit (HOSPITAL_COMMUNITY)
Admission: RE | Admit: 2016-11-23 | Discharge: 2016-11-23 | Disposition: A | Discharge status: Home / Self Care | Payer: Medicare HMO | Source: Ambulatory Visit | Attending: Cardiovascular Disease | Admitting: Cardiovascular Disease

## 2016-11-23 DIAGNOSIS — R600 Localized edema: Secondary | ICD-10-CM | POA: Diagnosis not present

## 2016-11-29 ENCOUNTER — Telehealth: Payer: Self-pay | Admitting: Adult Health

## 2016-11-29 ENCOUNTER — Other Ambulatory Visit: Payer: Self-pay | Admitting: Adult Health

## 2016-11-29 DIAGNOSIS — Z09 Encounter for follow-up examination after completed treatment for conditions other than malignant neoplasm: Secondary | ICD-10-CM

## 2016-11-29 MED ORDER — UMECLIDINIUM-VILANTEROL 62.5-25 MCG/INH IN AEPB: 1.0000 | INHALATION_SPRAY | Freq: Every day | RESPIRATORY_TRACT | 3 refills | Status: DC

## 2016-11-29 NOTE — Telephone Encounter (Signed)
Pt's daughter/ Tia Alert cld to request Rx refill on (2) meds for patient. Umeclindinium/Vilanterol(Anoro Ellipta 62.5- 25 MCG (inhaler)  &  Prozac 10 MG  Be sent/ called into [facility] for pick.  --glh

## 2016-11-29 NOTE — Telephone Encounter (Signed)
Pt's daughter/ mary snider called to request Rx refill on FLUxetine/Prozac 10MG  capsules & umeclidinium- vilanterol / Anoro-Ellipta 62.5-25 MCG/ inhaler--- Send Refill request to  [facility]. --glh

## 2016-11-29 NOTE — Telephone Encounter (Signed)
[facility] and asked that they have refills for Prozac transferred to their pharmacy.  Pharmacy agreeable.  Refill for Anoro sent to pharmacy.  Pt's daughter informed.  Charyl Bigger, CMA

## 2016-11-30 NOTE — Telephone Encounter (Signed)
LVM for pt's daughter informing her that refills are waiting at the pharmacy.  Charyl Bigger, CMA

## 2016-12-01 ENCOUNTER — Telehealth: Payer: Self-pay

## 2016-12-01 NOTE — Telephone Encounter (Signed)
-----   Message from Esaw Grandchild, NP sent at 11/30/2016  9:07 PM EST ----- Regarding: RE: Right Lower Extremity Venous Duplex Good Evening Amere Bricco, Can you please call Ms. Funches and share that her RLE venous duplex was negative for DVT. Thanks! Valetta Fuller ----- Message ----- From: Charlaine Dalton, RVT Sent: 11/23/2016   3:37 PM To: Esaw Grandchild, NP Subject: Right Lower Extremity Venous Duplex            Today's right lower extremity venous duplex is negative for DVT.

## 2016-12-01 NOTE — Telephone Encounter (Signed)
Pt's daughter informed of results.  Daughter expressed understanding.  Charyl Bigger, CMA

## 2016-12-06 DIAGNOSIS — H1013 Acute atopic conjunctivitis, bilateral: Secondary | ICD-10-CM | POA: Diagnosis not present

## 2016-12-06 DIAGNOSIS — H40033 Anatomical narrow angle, bilateral: Secondary | ICD-10-CM | POA: Diagnosis not present

## 2016-12-13 ENCOUNTER — Other Ambulatory Visit: Payer: Medicare HMO

## 2016-12-13 ENCOUNTER — Ambulatory Visit (INDEPENDENT_AMBULATORY_CARE_PROVIDER_SITE_OTHER): Payer: Medicare HMO | Admitting: Adult Health

## 2016-12-13 ENCOUNTER — Encounter: Payer: Self-pay | Admitting: Adult Health

## 2016-12-13 VITALS — BP 109/67 | HR 68 | Temp 97.8°F | Wt 150.5 lb

## 2016-12-13 DIAGNOSIS — R0982 Postnasal drip: Secondary | ICD-10-CM

## 2016-12-13 DIAGNOSIS — J209 Acute bronchitis, unspecified: Secondary | ICD-10-CM

## 2016-12-13 DIAGNOSIS — J440 Chronic obstructive pulmonary disease with acute lower respiratory infection: Secondary | ICD-10-CM

## 2016-12-13 MED ORDER — AZITHROMYCIN 250 MG PO TABS: ORAL_TABLET | ORAL | 0 refills | Status: DC

## 2016-12-13 MED ORDER — FLUTICASONE PROPIONATE 50 MCG/ACT NA SUSP: 1.0000 | Freq: Two times a day (BID) | NASAL | 6 refills | Status: DC

## 2016-12-13 NOTE — Assessment & Plan Note (Signed)
Please take Azithromycin, Tessalon, and Flonase as directed. Increase fluids/rest/vit c If symptoms persist after Azithromycin completed then please call clinic.

## 2016-12-13 NOTE — Progress Notes (Signed)
Subjective:    Patient ID: Donna Arias, female    DOB: [birth year], 83 y.o.   MRN: [id number]  HPI:  Donna Arias is here for non-productive cough, yellow/thick nasal congestion, sore throat, and increased fatigue that started >1.5 weeks ago and have been steadily worsening.  Donna Arias has had some relief with Tessalon 100mg  Q8H and Donna Arias has been pushing water and juice.  Donna Arias denies fever/CP/paliptations/dyspnea.  Donna Arias denies any GI complaints.    Patient Care Team    Relationship Specialty Notifications Start End  Esaw Grandchild, NP PCP - General Family Medicine  04/14/16     Patient Active Problem List   Diagnosis Date Noted  . Postnasal drip 12/13/2016  . Acute bronchitis 12/13/2016  . Orthostatic hypotension 11/22/2016  . Hyperlipidemia 11/22/2016  . Anxiety 11/22/2016  . Dysarthria 11/22/2016  . Weight gain 11/18/2016  . Edema of right foot 11/18/2016  . Age-related osteoporosis without current pathological fracture 11/16/2016  . Cough in adult 11/16/2016  . Healthcare maintenance 10/14/2016  . Diaper dermatitis 06/10/2016  . Antibiotic-induced yeast infection 06/10/2016  . Hearing loss secondary to cerumen impaction, bilateral 06/10/2016  . Chronic bilateral low back pain 04/19/2016  . Chronic neck pain 04/19/2016  . Medication refill 04/14/2016  . Joint laxity of right knee 04/14/2016  . Hypothyroidism 02/04/2016  . Vaginitis 07/19/2011  . Upper back pain 08/01/2009  . Transient cerebral ischemia 09/22/2007  . Dementia with behavioral disturbance 10/07/2006  . Essential hypertension 10/07/2006  . COPD (chronic obstructive pulmonary disease) ([place]) 10/07/2006  . GERD 10/07/2006     Past Medical History:  Diagnosis Date  . COPD (chronic obstructive pulmonary disease) ([place])   . Dementia   . Dementia   . GERD (gastroesophageal reflux disease)   . Heart attack ([place]) 1998  . Hypertension   . Stroke ([place])   . Thyroid disease      Past Surgical History:  Procedure  Laterality Date  . BREAST LUMPECTOMY  1995   Left Breast  . DILATION AND CURETTAGE OF UTERUS  1975  . GALLBLADDER SURGERY  1993     Family History  Problem Relation Age of Onset  . Hypertension Mother   . Stroke Mother   . Stroke Sister   . Stroke Sister   . Stroke Sister      Social History   Substance and Sexual Activity  Drug Use No     Social History   Substance and Sexual Activity  Alcohol Use No     Social History   Tobacco Use  Smoking Status Former Smoker  Smokeless Tobacco Never Used     Outpatient Encounter Medications as of 12/13/2016  Medication Sig  . Acetaminophen (TYLENOL 8 HOUR PO) Take by mouth.  [name] albuterol (PROVENTIL HFA;VENTOLIN HFA) 108 (90 Base) MCG/ACT inhaler Inhale 2 puffs into the lungs every 6 (six) hours as needed.  [name] albuterol (PROVENTIL) (2.5 MG/3ML) 0.083% nebulizer solution Take 3 mLs (2.5 mg total) by nebulization every 6 (six) hours as needed for wheezing or shortness of breath.  [name] amLODipine (NORVASC) 5 MG tablet Take 1 tablet (5 mg total) by mouth daily. (Patient taking differently: Take 2.5 mg by mouth daily. )  . aspirin 81 MG tablet Take 81 mg by mouth daily.  [name] atorvastatin (LIPITOR) 10 MG tablet Take 1 tablet (10 mg total) by mouth daily.  . benzonatate (TESSALON) 100 MG capsule Take 1 capsule (100 mg total) by mouth 3 (three) times daily as needed for  cough.  . Calcium Carbonate-Vit D-Min (CALCIUM 1200 PO) Take by mouth.  . fexofenadine (ALLEGRA) 60 MG tablet Take 1 tablet (60 mg total) by mouth daily.  [name] FLUoxetine (PROZAC) 10 MG capsule Take 1 capsule (10 mg total) by mouth daily.  . fluticasone (FLONASE) 50 MCG/ACT nasal spray Place 1 spray 2 (two) times daily into both nostrils.  . furosemide (LASIX) 20 MG tablet Take 1 tablet (20 mg total) by mouth daily.  [name] ketoconazole (NIZORAL) 2 % shampoo Apply 1 application topically 2 (two) times a week.  . levothyroxine (SYNTHROID, LEVOTHROID) 50 MCG tablet TAKE ONE (1)  TABLET BY MOUTH EVERY DAY  . potassium chloride SA (K-DUR,KLOR-CON) 20 MEQ tablet TAKE 1 TABLET BY MOUTH ONCE DAILY  . risperiDONE (RISPERDAL) 1 MG tablet Take 1 tablet (1 mg total) by mouth 2 (two) times daily.  [name] triamcinolone cream (KENALOG) 0.1 % Apply 1 application topically 2 (two) times daily.  [name] umeclidinium-vilanterol (ANORO ELLIPTA) 62.5-25 MCG/INH AEPB Inhale 1 puff daily into the lungs.  . Vitamin D, Ergocalciferol, (DRISDOL) 50000 units CAPS capsule Take 1 capsule (50,000 Units total) by mouth every 7 (seven) days.  . [DISCONTINUED] fluticasone (FLONASE) 50 MCG/ACT nasal spray Place 1 spray into both nostrils 2 (two) times daily.  [name] azithromycin (ZITHROMAX) 250 MG tablet 2 tabs by mouth day one.  Then 1 tab daily days two-five.   No facility-administered encounter medications on file as of 12/13/2016.     Allergies: Codeine sulfate  Body mass index is 25.63 kg/m.  Blood pressure 109/67, pulse 68, temperature 97.8 F (36.6 C), temperature source Oral, weight 150 lb 8 oz (68.3 kg), SpO2 97 %.     Review of Systems  Constitutional: Positive for activity change. Negative for appetite change, chills, diaphoresis, fever and unexpected weight change.  HENT: Positive for congestion, postnasal drip, rhinorrhea and sore throat. Negative for sinus pressure and sinus pain.   Respiratory: Positive for cough. Negative for chest tightness, shortness of breath, wheezing and stridor.   Cardiovascular: Negative for chest pain, palpitations and leg swelling.  Gastrointestinal: Negative for abdominal distention, abdominal pain, blood in stool, constipation, diarrhea, nausea and vomiting.  Endocrine: Negative for cold intolerance, heat intolerance, polydipsia, polyphagia and polyuria.  Genitourinary: Negative for difficulty urinating, flank pain and hematuria.  Neurological: Negative for dizziness and headaches.       Objective:   Physical Exam  Constitutional: Donna Arias is oriented to  person, place, and time. Donna Arias appears well-developed and well-nourished. No distress.  HENT:  Head: Normocephalic and atraumatic.  Right Ear: Tympanic membrane and external ear normal. Tympanic membrane is not erythematous and not bulging. Decreased hearing is noted.  Left Ear: Tympanic membrane and external ear normal. Tympanic membrane is not erythematous and not bulging. Decreased hearing is noted.  Nose: Mucosal edema and rhinorrhea present. Right sinus exhibits no maxillary sinus tenderness and no frontal sinus tenderness. Left sinus exhibits no maxillary sinus tenderness and no frontal sinus tenderness.  Mouth/Throat: Uvula is midline. Donna Arias has dentures. Posterior oropharyngeal edema and posterior oropharyngeal erythema present.  Eyes: Conjunctivae are normal. Pupils are equal, round, and reactive to light.  Neck: Normal range of motion. Neck supple.  Cardiovascular: Normal rate, regular rhythm, normal heart sounds and intact distal pulses.  Pulmonary/Chest: Effort normal and breath sounds normal. No respiratory distress. Donna Arias has no wheezes. Donna Arias has no rales. Donna Arias exhibits no tenderness.  Pt ambulated on RA, sats remained >97% No distress noted  Lymphadenopathy:    Donna Arias has  no cervical adenopathy.  Neurological: Donna Arias is alert and oriented to person, place, and time.  Skin: Skin is warm and dry. No rash noted. Donna Arias is not diaphoretic. No erythema. No pallor.  Psychiatric: Donna Arias has a normal mood and affect. Her behavior is normal. Judgment and thought content normal.          Assessment & Plan:   1. Postnasal drip   2. Acute bronchitis, unspecified organism     Acute bronchitis Pt ambulated on RA, sats >97%, no resp distress noted. Please take Azithromycin, Tessalon, and Flonase as directed. Increase fluids/rest/vit c If symptoms persist after Azithromycin completed then please call clinic.  Postnasal drip Please take Azithromycin, Tessalon, and Flonase as directed. Increase  fluids/rest/vit c If symptoms persist after Azithromycin completed then please call clinic.    FOLLOW-UP:  Return if symptoms worsen or fail to improve.

## 2016-12-13 NOTE — Assessment & Plan Note (Addendum)
Pt ambulated on RA, sats >97%, no resp distress noted. Please take Azithromycin, Tessalon, and Flonase as directed. Increase fluids/rest/vit c If symptoms persist after Azithromycin completed then please call clinic.

## 2016-12-13 NOTE — Patient Instructions (Signed)

## 2017-01-20 ENCOUNTER — Other Ambulatory Visit: Payer: Medicare HMO

## 2017-01-20 DIAGNOSIS — E559 Vitamin D deficiency, unspecified: Secondary | ICD-10-CM | POA: Diagnosis not present

## 2017-01-20 DIAGNOSIS — Z79899 Other long term (current) drug therapy: Secondary | ICD-10-CM

## 2017-01-20 DIAGNOSIS — E789 Disorder of lipoprotein metabolism, unspecified: Secondary | ICD-10-CM

## 2017-01-21 LAB — ALT: ALT: 24 IU/L (ref 0–32)

## 2017-01-21 LAB — LIPID PANEL
CHOL/HDL RATIO: 2.3 ratio (ref 0.0–4.4)
Cholesterol, Total: 150 mg/dL (ref 100–199)
HDL: 64 mg/dL (ref >39)
LDL CALC: 66 mg/dL (ref 0–99)
TRIGLYCERIDES: 98 mg/dL (ref 0–149)
VLDL Cholesterol Cal: 20 mg/dL (ref 5–40)

## 2017-01-21 LAB — VITAMIN D 25 HYDROXY (VIT D DEFICIENCY, FRACTURES): Vit D, 25-Hydroxy: 37.5 ng/mL (ref 30.0–100.0)

## 2017-02-02 ENCOUNTER — Encounter: Payer: Self-pay | Admitting: Family Medicine

## 2017-02-02 ENCOUNTER — Ambulatory Visit (INDEPENDENT_AMBULATORY_CARE_PROVIDER_SITE_OTHER): Payer: Medicare HMO | Admitting: Family Medicine

## 2017-02-02 VITALS — BP 112/74 | HR 69 | Temp 98.3°F | Ht 64.25 in | Wt 153.0 lb

## 2017-02-02 DIAGNOSIS — R059 Cough, unspecified: Secondary | ICD-10-CM

## 2017-02-02 DIAGNOSIS — J449 Chronic obstructive pulmonary disease, unspecified: Secondary | ICD-10-CM | POA: Diagnosis not present

## 2017-02-02 DIAGNOSIS — J0110 Acute frontal sinusitis, unspecified: Secondary | ICD-10-CM

## 2017-02-02 DIAGNOSIS — R05 Cough: Secondary | ICD-10-CM

## 2017-02-02 DIAGNOSIS — R51 Headache: Secondary | ICD-10-CM

## 2017-02-02 DIAGNOSIS — R519 Headache, unspecified: Secondary | ICD-10-CM

## 2017-02-02 DIAGNOSIS — J329 Chronic sinusitis, unspecified: Secondary | ICD-10-CM

## 2017-02-02 MED ORDER — AMOXICILLIN-POT CLAVULANATE 875-125 MG PO TABS: 1.0000 | ORAL_TABLET | Freq: Two times a day (BID) | ORAL | 0 refills | Status: AC

## 2017-02-02 MED ORDER — PREDNISONE 10 MG (21) PO TBPK
ORAL_TABLET | ORAL | 0 refills | Status: DC
Start: 2017-02-02 — End: 2017-02-28

## 2017-02-02 MED ORDER — BENZONATATE 100 MG PO CAPS
100.0000 mg | ORAL_CAPSULE | Freq: Three times a day (TID) | ORAL | 0 refills | Status: DC | PRN
Start: 2017-02-02 — End: 2017-02-28

## 2017-02-02 NOTE — Patient Instructions (Signed)
Upper respiratory viral infection, Adult Adenoviruses are common viruses that cause many different types of infections. The viruses usually affect the lungs, but they can also affect other parts of the body, including the eyes, stomach, bowels, bladder, and brain. The most common type of adenovirus infection is the common cold. Usually, adenovirus infections are not severe unless you have another health problem that makes it hard for your body to fight off infection. What are the causes? You can get this condition if you:  Touch a surface or object that has an adenovirus on it and then touch your mouth, nose, or eyes with unwashed hands.  Come into close physical contact with an infected person, such as by hugging or shaking hands.  Breathe in droplets that fly through the air when an infected person talks, coughs, or sneezes.  Have contact with infected stool.  Swim in a pool that does not have enough chlorine.  Adenoviruses can live outside the body for many weeks. They spread easily from person to person (are contagious). What increases the risk? This condition is more likely to develop in:  People who spend a lot of time in places where there are many people, such as schools, summer camps, daycare centers, community centers, and [employer] recruit training centers.  Elderly adults.  People with a weak body defense system (immune system).  People with a lung disease.  People with a heart condition.  What are the signs or symptoms? Adenovirus infections usually cause flu-like symptoms. Once the virus gets into the body, symptoms of this condition can take up to 14 days to develop. Symptoms may include:  Headache.  Stiff neck.  Sleepiness or fatigue.  Confusion or disorientation.  Fever.  Sore throat.  Cough.  Trouble breathing.  Runny nose or congestion.  Pink eye (conjunctivitis).  Bleeding into the covering of the eye.  Stomachache or diarrhea.  Nausea or  vomiting.  Blood in the urine or pain while urinating.  Ear pain or fullness.  How is this diagnosed? This condition may be diagnosed based on your symptoms and a physical exam. Your health care provider may order tests to make sure your symptoms are not caused by another type of problem. Tests can include:  Blood tests.  Urine tests.  Stool tests.  Chest X-ray.  Tissue or throat culture.  How is this treated? This condition goes away on its own with time. Treatment for this condition involves managing symptoms until the condition goes away. Your health care provider may recommend:  Rest.  Drinking more fluids.  Taking over-the-counter medicine to help relieve a sore throat, fever, or headache.  Follow these instructions at home:  Rest at home until your symptoms go away.  Drink enough fluid to keep your urine clear or pale yellow.  Take over-the-counter and prescription medicines only as told by your health care provider.  Keep all follow-up visits as told by your health care provider. This is important. How is this prevented? Adenoviruses are resistant to many cleaning products and can remain on surfaces for long periods of time. To help prevent infection:  Wash your hands often with soap and water.  Cover your nose or mouth when you sneeze or cough.  Do not touch your eyes, nose, or mouth with unwashed hands.  Clean commonly used objects often.  Do not swim in a pool that is not properly chlorinated.  Avoid close contact with people who are sick.  Do not go to school or work when  you are sick.  Contact a health care provider if:  Your symptoms do not improve after 10 days.  Your symptoms get worse.  You cannot eat or drink without vomiting. Get help right away if:  You have trouble breathing or you are breathing rapidly.  Your skin, lips, or fingernails look blue (cyanosis).  You have a rapid heart rate.  You become confused.  You lose  consciousness. This information is not intended to replace advice given to you by your health care provider. Make sure you discuss any questions you have with your health care provider. Document Released: 04/03/2002 Document Revised: 09/08/2015 Document Reviewed: 09/08/2015 Elsevier Interactive Patient Education  [employer].

## 2017-02-02 NOTE — Progress Notes (Signed)
Acute Care Office visit  Assessment and plan:  1. Cough   2. Rhinosinusitis   3. Nonintractable headache, unspecified chronicity pattern, unspecified headache type   4. Acute frontal sinusitis, recurrence not specified   5. Chronic obstructive pulmonary disease, unspecified COPD type ([place])     - Viral vs Allergic vs Bacterial causes for pt's symptoms reveiwed.    - Supportive care and various OTC medications discussed in addition to any prescribed. - Augmentin prescribed to treat symptoms suspicious of early bronchitis. - She should hold on to the ABX medicine until she hits day 7, and only begin if symptoms worsen. - We discussed in detail the need to be prudent about use of antibiotics.  - Tessalon Perles for cough. - Will start prednisone taper for her cough, sinusitis and due to h/o COPD. - OTC alka seltzer and cold medicines recommended for congestion relief.  - Call or RTC if new symptoms, or if no improvement or worse over next several days.   - If wheezes or SOB develop, she will return for further evaluation.    Meds ordered this encounter  Medications  . predniSONE (STERAPRED UNI-PAK 21 TAB) 10 MG (21) TBPK tablet    Sig: 12 day taper pack, use as directed    Dispense:  1 tablet    Refill:  0  . amoxicillin-clavulanate (AUGMENTIN) 875-125 MG tablet    Sig: Take 1 tablet by mouth 2 (two) times daily for 10 days.    Dispense:  20 tablet    Refill:  0  . benzonatate (TESSALON) 100 MG capsule    Sig: Take 1 capsule (100 mg total) by mouth 3 (three) times daily as needed for cough.    Dispense:  20 capsule    Refill:  0    No orders of the defined types were placed in this encounter.   Gross side effects, risk and benefits, and alternatives of medications discussed with patient.  Patient is aware that all medications have potential side effects and we are unable to predict every sideeffect or drug-drug interaction that may occur.  Expresses verbal understanding  and consents to current therapy plan and treatment regiment.   Education and routine counseling performed. Handouts provided.  Anticipatory guidance and routine counseling done re: condition, txmnt options and need for follow up. All questions of patient's were answered.  Return if symptoms worsen or fail to improve.  Please see AVS handed out to patient at the end of our visit for additional patient instructions/ counseling done pertaining to today's office visit.  Note: This document was partially repared using Dragon voice recognition software and may include unintentional dictation errors.  This document serves as a record of services personally performed by Mellody Dance, DO. It was created on her behalf by Toni Amend, a trained medical scribe. The creation of this record is based on the scribe's personal observations and the provider's statements to them.   I have reviewed the above medical documentation for accuracy and completeness and I concur.  Mellody Dance 02/02/17 11:03 AM    Subjective:    Chief Complaint  Patient presents with  . Cough    non productive cough, head and chest congestion, earache bilat, headaches   She is accompanied today by her daughter.  HPI:  Pt presents with Sx for the past six days. Her daughter notes that her symptoms never completely went away from her last visit, which was on 12/13/2016. Daughter notes that the Z-pack prescribed  then "helped a great deal," but there was the residual of coughing and sneezing every day.  Daughter says that she noticed her mother was wheezing last night, and was helping treat her symptoms with Vicks vapo rub and humidifier.  Her daughter has not checked her for fever.  Daughter says that the patient sleeps under a lot of blankets.  C/o: Headaches in both temples, congestion, fever, sore throat, wheezing, coughing.  Denies: Urinary issues, sinus pain, denies symptoms in her chest.    For  symptoms patient has tried:  Vicks vapo rub, humidifier. Daughter says that Ladona Ridgel work for her cough. Daughter notes that prednisone has helped symptoms in the past. Not currently taking anything OTC for cough and cold.  Overall getting:   Daughter says that her symptoms have been getting worse.  Patient has a remote history of smoking.   Patient Care Team    Relationship Specialty Notifications Start End  [place], Valetta Fuller D, NP PCP - General Family Medicine  04/14/16     Past medical history, Surgical history, Family history reviewed and noted below, Social history, Allergies, and Medications have been entered into the medical record, reviewed and changed as needed.   Allergies  Allergen Reactions  . Codeine Sulfate     REACTION: unspecified    Review of Systems: - see above HPI for pertinent positives General:   No F/C, wt loss Pulm:   No DIB, pleuritic chest pain Card:  No CP, palpitations Abd:  No n/v/d or pain Ext:  No inc edema from baseline   Objective:   Blood pressure 112/74, pulse 69, temperature 98.3 F (36.8 C), height 5' 4.25" (1.632 m), weight 153 lb (69.4 kg), SpO2 95 %. Body mass index is 26.06 kg/m. General: Well Developed, well nourished, appropriate for stated age.  Neuro: Alert and oriented x3, extra-ocular muscles intact, sensation grossly intact.  HEENT: Normocephalic, atraumatic, pupils equal round reactive to light, neck supple, no masses, no painful lymphadenopathy, TM's intact B/L, mild opacity of bilateral TM. Nares- patent, clear d/c, OP- Erythema posterior oropharynx, No TTP sinuses. Skin: Warm and dry, no gross rash. Cardiac: RRR, S1 S2,  no murmurs rubs or gallops.  Respiratory: Mild crackles in the base of left side. Not using accessory muscles, speaking in full sentences- unlabored. Vascular:  No gross lower ext edema, cap RF less 2 sec. Psych: No HI/SI, judgement and insight good, Euthymic mood. Full Affect.

## 2017-02-09 ENCOUNTER — Other Ambulatory Visit: Payer: Self-pay | Admitting: Adult Health

## 2017-02-09 MED ORDER — ALBUTEROL SULFATE HFA 108 (90 BASE) MCG/ACT IN AERS: 2.0000 | INHALATION_SPRAY | Freq: Four times a day (QID) | RESPIRATORY_TRACT | 1 refills | Status: DC | PRN

## 2017-02-09 NOTE — Telephone Encounter (Signed)
[name] Snider/ patient's daughter called to request Rx refill on: (says inhaler given in 2017 expired in 2018)--  albuterol (PROVENTIL) (2.5 MG/3ML) 0.083% nebulizer solution [[id number]]  Order Details  Dose: 2.5 mg Route: Nebulization Frequency: Every 6 hours PRN for wheezing, shortness of breath  Dispense Quantity: 75 mL Refills: 12 Fills remaining: --        Sig: Take 3 mLs (2.5 mg total) by nebulization every 6 (six) hours as needed for wheezing or shortness of breath.     ---- Please send Rx refill to:  Pharmacy:  [place], [place].  --Call patient if any questions.  --glh

## 2017-02-24 NOTE — Progress Notes (Signed)
[facility]  PATIENT: Donna Arias DOB: December 23, 1926   REASON FOR VISIT: Follow-up for orthostatic hypotension HISTORY FROM: Patient and daughter Donna Arias    HISTORY OF PRESENT ILLNESS:UPDATE 2/4/2019CM Donna Arias, 84 year old female returns for follow-up with a history of orthostatic hypotension.  She has not had any episodes of dizziness passing out.  She now knows to slowly get up when changing positions.  Her blood pressure continues to drop when she goes from lying to seated to standing.  She remains on aspirin and Lipitor for secondary stroke prevention she walks around the house for exercise.  She lives with her daughter.  She has increased her water intake since last seen .  She returns for reevaluation  11/22/16 Dr. Karsten Ro is a 84 y.o. female with PMH of HTN, HLD, anxiety, "TIAs" who presents as a new patient for history of "TIAs".   Daughter stated that pt in 1990s to 2000s had several episodes of sudden onset lightheadedness, feeling of passing out and hot, diffuse diaphoresis, looks really sick, needs to sit down or lie down. Lasting a few minutes and resolved. It has happened 3 times since she lives with daughter since 79. Last Wednesday she was in bathroom and had again hot feeling, nauseous, daughter was afraid of another "TIA". She went to see her PCP and was referred over here.   Pt has been following with PCP for right leg cramps with braces, intermittent swelling of right leg, will have LE venous doppler tomorrow. Still has right knee pain, on brace at night. Able to walk with cane or without device for short distance. She is on risperdol for behavior issue for many years, so far tolerating well.    REVIEW OF SYSTEMS: Full 14 system review of systems performed and notable only for those listed, all others are neg:  Constitutional: neg  Cardiovascular: neg Ear/Nose/Throat: neg  Skin: neg Eyes: neg Respiratory: Cough Gastroitestinal: neg    Hematology/Lymphatic: neg  Endocrine: neg Musculoskeletal:neg Allergy/Immunology: neg Neurological: Headache Psychiatric: neg Sleep : neg   ALLERGIES: Allergies  Allergen Reactions  . Codeine Sulfate     REACTION: unspecified    HOME MEDICATIONS: Outpatient Medications Prior to Visit  Medication Sig Dispense Refill  . Acetaminophen (TYLENOL 8 HOUR PO) Take by mouth.    [name] albuterol (PROVENTIL HFA;VENTOLIN HFA) 108 (90 Base) MCG/ACT inhaler Inhale 2 puffs into the lungs every 6 (six) hours as needed. 1 Inhaler 1  . albuterol (PROVENTIL) (2.5 MG/3ML) 0.083% nebulizer solution Take 3 mLs (2.5 mg total) by nebulization every 6 (six) hours as needed for wheezing or shortness of breath. 75 mL 12  . amLODipine (NORVASC) 5 MG tablet Take 1 tablet (5 mg total) by mouth daily. (Patient taking differently: Take 2.5 mg by mouth daily. ) 90 tablet 3  . aspirin 81 MG tablet Take 81 mg by mouth daily.    [name] atorvastatin (LIPITOR) 10 MG tablet Take 1 tablet (10 mg total) by mouth daily. 90 tablet 1  . Calcium Carbonate-Vit D-Min (CALCIUM 1200 PO) Take 1 tablet by mouth.    . fexofenadine (ALLEGRA) 60 MG tablet Take 1 tablet (60 mg total) by mouth daily. 90 tablet 1  . FLUoxetine (PROZAC) 10 MG capsule Take 1 capsule (10 mg total) by mouth daily. 90 capsule 2  . fluticasone (FLONASE) 50 MCG/ACT nasal spray Place 1 spray 2 (two) times daily into both nostrils. 16 g 6  . furosemide (LASIX) 20 MG tablet Take 1 tablet (  20 mg total) by mouth daily. 90 tablet 3  . ketoconazole (NIZORAL) 2 % shampoo Apply 1 application topically 2 (two) times a week. 120 mL 1  . levothyroxine (SYNTHROID, LEVOTHROID) 50 MCG tablet TAKE ONE (1) TABLET BY MOUTH EVERY DAY 90 tablet 3  . potassium chloride SA (K-DUR,KLOR-CON) 20 MEQ tablet TAKE 1 TABLET BY MOUTH ONCE DAILY 90 tablet 3  . risperiDONE (RISPERDAL) 1 MG tablet Take 1 tablet (1 mg total) by mouth 2 (two) times daily. 200 tablet 3  . triamcinolone cream (KENALOG) 0.1  % Apply 1 application topically 2 (two) times daily. 30 g 1  . umeclidinium-vilanterol (ANORO ELLIPTA) 62.5-25 MCG/INH AEPB Inhale 1 puff daily into the lungs. 1 each 3  . benzonatate (TESSALON) 100 MG capsule Take 1 capsule (100 mg total) by mouth 3 (three) times daily as needed for cough. 20 capsule 0  . predniSONE (STERAPRED UNI-PAK 21 TAB) 10 MG (21) TBPK tablet 12 day taper pack, use as directed 1 tablet 0   No facility-administered medications prior to visit.     PAST MEDICAL HISTORY: Past Medical History:  Diagnosis Date  . COPD (chronic obstructive pulmonary disease) ([place])   . Dementia   . Dementia   . GERD (gastroesophageal reflux disease)   . Heart attack ([place]) 1998  . Hypertension   . Stroke ([place])   . Thyroid disease     PAST SURGICAL HISTORY: Past Surgical History:  Procedure Laterality Date  . BREAST LUMPECTOMY  1995   Left Breast  . DILATION AND CURETTAGE OF UTERUS  1975  . GALLBLADDER SURGERY  1993    FAMILY HISTORY: Family History  Problem Relation Age of Onset  . Hypertension Mother   . Stroke Mother   . Stroke Sister   . Stroke Sister   . Stroke Sister     SOCIAL HISTORY: Social History   Socioeconomic History  . Marital status: Widowed    Spouse name: Not on file  . Number of children: 1  . Years of education: Not on file  . Highest education level: Not on file  Social Needs  . Financial resource strain: Not on file  . Food insecurity - worry: Not on file  . Food insecurity - inability: Not on file  . Transportation needs - medical: Not on file  . Transportation needs - non-medical: Not on file  Occupational History  . Occupation: RETIRED  Tobacco Use  . Smoking status: Former Research scientist (life sciences)  . Smokeless tobacco: Never Used  Substance and Sexual Activity  . Alcohol use: No  . Drug use: No  . Sexual activity: No    Birth control/protection: None  Other Topics Concern  . Not on file  Social History Narrative   CB x 1   Lives at home with  her daughter (since 02/05/2013)   Right handed   Regular exercise - NO     PHYSICAL EXAM  Vitals:   02/28/17 1309  Weight: 149 lb (67.6 kg)  Height: 5' 4.25" (1.632 m)   Body mass index is 25.38 kg/m. LYING B/P 166/90 Seated 132/79 standing 117/71 Generalized: Well developed, in no acute distress  Head: normocephalic and atraumatic,. Oropharynx benign  Neck: Supple, no carotid bruits  Cardiac: Regular rate rhythm, no murmur  Musculoskeletal: No deformity   Neurological examination   Mentation: Alert oriented to time, place, history primarily from daughter Donna Arias .[name] Recent and remote memory intact.  Follows all commands speech with mild dysarthria due to dentures  Cranial  nerve II-XII: .Pupils were equal round reactive to light extraocular movements were full, visual field were full on confrontational test. Facial sensation and strength were normal. hearing was intact to finger rubbing bilaterally. Uvula tongue midline. head turning and shoulder shrug were normal and symmetric.Tongue protrusion into cheek strength was normal. Motor: normal bulk and tone, full strength in the BUE, BLE, fine finger movements normal, no pronator drift. No focal weakness Sensory: normal and symmetric to light touch, pinprick, and  Vibration, in the upper and lower extremities Coordination: finger-nose-finger, heel-to-shin bilaterally, no dysmetria, no tremor Reflexes: Symmetric upper and lower plantar responses were flexor bilaterally. Gait and Station: Rising up from seated position without assistance, normal stance,  moderate stride, good arm swing, smooth turning, able to perform tiptoe, and heel walking without difficulty. Tandem gait is unsteady.  No assistive device  DIAGNOSTIC DATA (LABS, IMAGING, TESTING) - I reviewed patient records, labs, notes, testing and imaging myself where available.  Lab Results  Component Value Date   WBC 5.3 10/25/2016   HGB 13.8 10/25/2016   HCT 41.7 10/25/2016    MCV 94 10/25/2016   PLT 187 10/25/2016      Component Value Date/Time   NA 147 (H) 10/25/2016 0946   K 4.4 10/25/2016 0946   CL 108 (H) 10/25/2016 0946   CO2 26 10/25/2016 0946   GLUCOSE 97 10/25/2016 0946   GLUCOSE 108 (H) 08/01/2015 0933   BUN 17 10/25/2016 0946   CREATININE 1.07 (H) 10/25/2016 0946   CALCIUM 9.4 10/25/2016 0946   PROT 6.7 10/25/2016 0946   ALBUMIN 4.1 10/25/2016 0946   AST 24 10/25/2016 0946   ALT 24 01/20/2017 0948   ALKPHOS 106 10/25/2016 0946   BILITOT 0.3 10/25/2016 0946   GFRNONAA 46 (L) 10/25/2016 0946   GFRAA 54 (L) 10/25/2016 0946   Lab Results  Component Value Date   CHOL 150 01/20/2017   HDL 64 01/20/2017   LDLCALC 66 01/20/2017   LDLDIRECT 148.4 04/14/2012   TRIG 98 01/20/2017   CHOLHDL 2.3 01/20/2017   Lab Results  Component Value Date   HGBA1C 5.5 10/25/2016   Lab Results  Component Value Date   VITAMINB12 321 10/25/2016   Lab Results  Component Value Date   TSH 4.340 10/25/2016      ASSESSMENT AND PLAN SARHA BARTELT is a 85 y.o. female with PMH of HTN, HLD, anxiety, "TIAs" who presents as a new patient for history of "TIAs". "TIAs" were episodes of sudden onset lightheadedness, feeling of passing out and hot, diffuse diaphoresis, looks really sick, needs to sit down or lie down. Lasting a few minutes and resolved, typical for near syncope. Her orthostatic BP dropped 49 from lying to standing, consistent with the diagnosis.  .The patient is a current patient of Dr. Erlinda Hong  who is out of the office today . This note is sent to the work in doctor.     Continue ASA and lipitor for stroke prevention Avoid dehydration. Stand up slowly and avoid to stand too long Check BP if any further episode occurs. When it happens, sit down or lie down to get better-  Check BP at home and record  Follow up with your primary care physician for stroke risk factor modification. Recommend maintain blood pressure goal <130/80, diabetes with hemoglobin A1c  goal below 7.0% and lipids with LDL cholesterol goal below 70 mg/dL.  Follow up in 6 months.  I spent 25 minutes in total face to face time with the  patient more than 50% of which was spent counseling and coordination of care, reviewing test results reviewing medications and discussing and reviewing the diagnosis of orthostatic hypotension, written information given to patient and daughter and this was reviewed with them and given a copy for further reference Dennie Bible, [facility], [facility], [facility] [address] [place], [place] [address] [phone]

## 2017-02-28 ENCOUNTER — Encounter: Payer: Self-pay | Admitting: Nurse Practitioner

## 2017-02-28 ENCOUNTER — Ambulatory Visit: Payer: Medicare HMO | Admitting: Nurse Practitioner

## 2017-02-28 VITALS — Ht 64.25 in | Wt 149.0 lb

## 2017-02-28 DIAGNOSIS — E785 Hyperlipidemia, unspecified: Secondary | ICD-10-CM

## 2017-02-28 DIAGNOSIS — G458 Other transient cerebral ischemic attacks and related syndromes: Secondary | ICD-10-CM

## 2017-02-28 DIAGNOSIS — I951 Orthostatic hypotension: Secondary | ICD-10-CM

## 2017-02-28 NOTE — Progress Notes (Signed)
I agree with the assessment and plan as directed by NP .The patient is known to me .   Donna Sturdivant, MD  

## 2017-02-28 NOTE — Patient Instructions (Addendum)
Continue ASA and lipitor for stroke prevention Avoid dehydration. Stand up slowly and avoid to stand too long Check BP if any further episode occurs. When it happens, sit down or lie down to get better-  Check BP at home and record  Follow up with your primary care physician for stroke risk factor modification. Recommend maintain blood pressure goal <130/80, diabetes with hemoglobin A1c goal below 7.0% and lipids with LDL cholesterol goal below 70 mg/dL.  Follow up in 6 months.   Orthostatic Hypotension Orthostatic hypotension is a sudden drop in blood pressure that happens when you quickly change positions, such as when you get up from a seated or lying position. Blood pressure is a measurement of how strongly, or weakly, your blood is pressing against the walls of your arteries. Arteries are blood vessels that carry blood from your heart throughout your body. When blood pressure is too low, you may not get enough blood to your brain or to the rest of your organs. This can cause weakness, light-headedness, rapid heartbeat, and fainting. This can last for just a few seconds or for up to a few minutes. Orthostatic hypotension is usually not a serious problem. However, if it happens frequently or gets worse, it may be a sign of something more serious. What are the causes? This condition may be caused by:  Sudden changes in posture, such as standing up quickly after you have been sitting or lying down.  Blood loss.  Loss of body fluids (dehydration).  Heart problems.  Hormone (endocrine) problems.  Pregnancy.  Severe infection.  Lack of certain nutrients.  Severe allergic reactions (anaphylaxis).  Certain medicines, such as blood pressure medicine or medicines that make the body lose excess fluids (diuretics). Sometimes, this condition can be caused by not taking medicine as directed, such as taking too much of a certain medicine.  What increases the risk? Certain factors can make you  more likely to develop orthostatic hypotension, including:  Age. Risk increases as you get older.  Conditions that affect the heart or the central nervous system.  Taking certain medicines, such as blood pressure medicine or diuretics.  Being pregnant.  What are the signs or symptoms? Symptoms of this condition may include:  Weakness.  Light-headedness.  Dizziness.  Blurred vision.  Fatigue.  Rapid heartbeat.  Fainting, in severe cases.  How is this diagnosed? This condition is diagnosed based on:  Your medical history.  Your symptoms.  Your blood pressure measurement. Your health care provider will check your blood pressure when you are: ? Lying down. ? Sitting. ? Standing.  A blood pressure reading is recorded as two numbers, such as "120 over 80" (or 120/80). The first ("top") number is called the systolic pressure. It is a measure of the pressure in your arteries as your heart beats. The second ("bottom") number is called the diastolic pressure. It is a measure of the pressure in your arteries when your heart relaxes between beats. Blood pressure is measured in a unit called mm Hg. Healthy blood pressure for adults is 120/80. If your blood pressure is below 90/60, you may be diagnosed with hypotension. Other information or tests that may be used to diagnose orthostatic hypotension include:  Your other vital signs, such as your heart rate and temperature.  Blood tests.  Tilt table test. For this test, you will be safely secured to a table that moves you from a lying position to an upright position. Your heart rhythm and blood pressure will be  monitored during the test.  How is this treated? Treatment for this condition may include:  Changing your diet. This may involve eating more salt (sodium) or drinking more water.  Taking medicines to raise your blood pressure.  Changing the dosage of certain medicines you are taking that might be lowering your blood  pressure.  Wearing compression stockings. These stockings help to prevent blood clots and reduce swelling in your legs.  In some cases, you may need to go to the hospital for:  Fluid replacement. This means you will receive fluids through an IV tube.  Blood replacement. This means you will receive donated blood through an IV tube (transfusion).  Treating an infection or heart problems, if this applies.  Monitoring. You may need to be monitored while medicines that you are taking wear off.  Follow these instructions at home: Eating and drinking   Drink enough fluid to keep your urine clear or pale yellow.  Eat a healthy diet and follow instructions from your health care provider about eating or drinking restrictions. A healthy diet includes: ? Fresh fruits and vegetables. ? Whole grains. ? Lean meats. ? Low-fat dairy products.  Eat extra salt only as directed. Do not add extra salt to your diet unless your health care provider told you to do that.  Eat frequent, small meals.  Avoid standing up suddenly after eating. Medicines  Take over-the-counter and prescription medicines only as told by your health care provider. ? Follow instructions from your health care provider about changing the dosage of your current medicines, if this applies. ? Do not stop or adjust any of your medicines on your own. General instructions  Wear compression stockings as told by your health care provider.  Get up slowly from lying down or sitting positions. This gives your blood pressure a chance to adjust.  Avoid hot showers and excessive heat as directed by your health care provider.  Return to your normal activities as told by your health care provider. Ask your health care provider what activities are safe for you.  Do not use any products that contain nicotine or tobacco, such as cigarettes and e-cigarettes. If you need help quitting, ask your health care provider.  Keep all follow-up  visits as told by your health care provider. This is important. Contact a health care provider if:  You vomit.  You have diarrhea.  You have a fever for more than 2-3 days.  You feel more thirsty than usual.  You feel weak and tired. Get help right away if:  You have chest pain.  You have a fast or irregular heartbeat.  You develop numbness in any part of your body.  You cannot move your arms or your legs.  You have trouble speaking.  You become sweaty or feel lightheaded.  You faint.  You feel short of breath.  You have trouble staying awake.  You feel confused. This information is not intended to replace advice given to you by your health care provider. Make sure you discuss any questions you have with your health care provider. Document Released: 01/01/2002 Document Revised: 09/30/2015 Document Reviewed: 07/04/2015 Elsevier Interactive Patient Education  2018 [employer].

## 2017-03-07 ENCOUNTER — Other Ambulatory Visit: Payer: Medicare HMO

## 2017-03-22 ENCOUNTER — Telehealth: Payer: Self-pay | Admitting: Adult Health

## 2017-04-20 ENCOUNTER — Other Ambulatory Visit: Payer: Medicare HMO

## 2017-04-28 NOTE — Progress Notes (Addendum)
Subjective:    Patient ID: Donna Arias, female    DOB: [birth year], 84 y.o.   MRN: [id number]  HPI:  Ms. Cutbirth is here for f/u:HTN,HLD, Hypothyroidism, lower ext edema,  She reports medication compliance and denies SE She was seen by Neurology 02/28/17 for "lightheadedness" and hx of TIA She was advised to continue current rx regime and BP gaol <130/80, A1c<7, LDL goal 70mg /dL, neuro recommended f/u in 6 months. Only acute compliant today- minor swelling around ankles noted this morning. She had grilled cheese and tomato soup last night for dinner She denies CP/dyspnea/palpitations/increased fatigue. She reports strong appetite and sound sleep. Daughter at [facility] during [place]  Patient Care Team    Relationship Specialty Notifications Start End  Esaw Grandchild, NP PCP - General Family Medicine  04/14/16     Patient Active Problem List   Diagnosis Date Noted  . Postnasal drip 12/13/2016  . Acute bronchitis 12/13/2016  . Orthostatic hypotension 11/22/2016  . Hyperlipidemia 11/22/2016  . Anxiety 11/22/2016  . Dysarthria 11/22/2016  . Weight gain 11/18/2016  . Edema of right foot 11/18/2016  . Age-related osteoporosis without current pathological fracture 11/16/2016  . Cough in adult 11/16/2016  . Healthcare maintenance 10/14/2016  . Diaper dermatitis 06/10/2016  . Antibiotic-induced yeast infection 06/10/2016  . Hearing loss secondary to cerumen impaction, bilateral 06/10/2016  . Chronic bilateral low back pain 04/19/2016  . Chronic neck pain 04/19/2016  . Medication refill 04/14/2016  . Joint laxity of right knee 04/14/2016  . Hypothyroidism 02/04/2016  . Vaginitis 07/19/2011  . Upper back pain 08/01/2009  . Transient cerebral ischemia 09/22/2007  . Dementia with behavioral disturbance 10/07/2006  . Essential hypertension 10/07/2006  . COPD (chronic obstructive pulmonary disease) ([place]) 10/07/2006  . GERD 10/07/2006     Past Medical History:  Diagnosis Date  . COPD  (chronic obstructive pulmonary disease) ([place])   . Dementia   . Dementia   . GERD (gastroesophageal reflux disease)   . Heart attack ([place]) 1998  . Hypertension   . Stroke ([place])   . Thyroid disease      Past Surgical History:  Procedure Laterality Date  . BREAST LUMPECTOMY  1995   Left Breast  . DILATION AND CURETTAGE OF UTERUS  1975  . GALLBLADDER SURGERY  1993     Family History  Problem Relation Age of Onset  . Hypertension Mother   . Stroke Mother   . Stroke Sister   . Stroke Sister   . Stroke Sister      Social History   Substance and Sexual Activity  Drug Use No     Social History   Substance and Sexual Activity  Alcohol Use No     Social History   Tobacco Use  Smoking Status Former Smoker  Smokeless Tobacco Never Used     Outpatient Encounter Medications as of 05/02/2017  Medication Sig  . Acetaminophen (TYLENOL 8 HOUR PO) Take by mouth.  [name] albuterol (PROVENTIL HFA;VENTOLIN HFA) 108 (90 Base) MCG/ACT inhaler Inhale 2 puffs into the lungs every 6 (six) hours as needed.  [name] albuterol (PROVENTIL) (2.5 MG/3ML) 0.083% nebulizer solution Take 3 mLs (2.5 mg total) by nebulization every 6 (six) hours as needed for wheezing or shortness of breath.  [name] aspirin 81 MG tablet Take 81 mg by mouth daily.  [name] atorvastatin (LIPITOR) 10 MG tablet Take 1 tablet (10 mg total) by mouth daily.  . Calcium Carbonate-Vit D-Min (CALCIUM 1200 PO) Take 1 tablet by mouth.  [name]  FLUoxetine (PROZAC) 10 MG capsule Take 1 capsule (10 mg total) by mouth daily.  . fluticasone (FLONASE) 50 MCG/ACT nasal spray Place 1 spray 2 (two) times daily into both nostrils.  . furosemide (LASIX) 20 MG tablet Take 1 tablet (20 mg total) by mouth daily.  [name] ketoconazole (NIZORAL) 2 % shampoo Apply 1 application topically 2 (two) times a week.  . levothyroxine (SYNTHROID, LEVOTHROID) 50 MCG tablet TAKE ONE (1) TABLET BY MOUTH EVERY DAY  . loratadine (CLARITIN) 10 MG tablet Take 10 mg by mouth daily.  .  potassium chloride SA (K-DUR,KLOR-CON) 20 MEQ tablet TAKE 1 TABLET BY MOUTH ONCE DAILY  . risperiDONE (RISPERDAL) 1 MG tablet Take 1 tablet (1 mg total) by mouth 2 (two) times daily.  [name] triamcinolone cream (KENALOG) 0.1 % Apply 1 application topically 2 (two) times daily.  [name] umeclidinium-vilanterol (ANORO ELLIPTA) 62.5-25 MCG/INH AEPB Inhale 1 puff into the lungs daily.  . [DISCONTINUED] amLODipine (NORVASC) 5 MG tablet Take 1 tablet (5 mg total) by mouth daily. (Patient taking differently: Take 2.5 mg by mouth daily. )  . [DISCONTINUED] atorvastatin (LIPITOR) 10 MG tablet Take 1 tablet (10 mg total) by mouth daily.  . [DISCONTINUED] FLUoxetine (PROZAC) 10 MG capsule Take 1 capsule (10 mg total) by mouth daily.  . [DISCONTINUED] furosemide (LASIX) 20 MG tablet Take 1 tablet (20 mg total) by mouth daily.  . [DISCONTINUED] levothyroxine (SYNTHROID, LEVOTHROID) 50 MCG tablet TAKE ONE (1) TABLET BY MOUTH EVERY DAY  . [DISCONTINUED] potassium chloride SA (K-DUR,KLOR-CON) 20 MEQ tablet TAKE 1 TABLET BY MOUTH ONCE DAILY  . [DISCONTINUED] umeclidinium-vilanterol (ANORO ELLIPTA) 62.5-25 MCG/INH AEPB Inhale 1 puff daily into the lungs.  [name] amLODipine (NORVASC) 2.5 MG tablet Take 1 tablet (2.5 mg total) by mouth daily.  . cyclobenzaprine (FLEXERIL) 5 MG tablet Take 0.5 tablets (2.5 mg total) by mouth at bedtime as needed for muscle spasms.  . [DISCONTINUED] fexofenadine (ALLEGRA) 60 MG tablet Take 1 tablet (60 mg total) by mouth daily.   No facility-administered encounter medications on file as of 05/02/2017.     Allergies: Codeine sulfate  Body mass index is 26.3 kg/m.  Blood pressure 116/76, pulse 64, height 5' 4.25" (1.632 m), weight 154 lb 6.4 oz (70 kg), SpO2 97 %.    Review of Systems  Constitutional: Negative for activity change, appetite change, chills, diaphoresis, fatigue, fever and unexpected weight change.  Respiratory: Negative for cough, chest tightness, shortness of breath, wheezing and  stridor.   Cardiovascular: Positive for leg swelling. Negative for chest pain and palpitations.  Gastrointestinal: Negative for abdominal distention, abdominal pain, blood in stool, constipation, diarrhea, nausea and vomiting.  Genitourinary: Negative for difficulty urinating, flank pain and hematuria.  Musculoskeletal: Positive for arthralgias, back pain, gait problem, joint swelling, myalgias, neck pain and neck stiffness.  Neurological: Negative for dizziness and headaches.  Hematological: Does not bruise/bleed easily.  Psychiatric/Behavioral: Negative for hallucinations, self-injury, sleep disturbance and suicidal ideas. The patient is not nervous/anxious and is not hyperactive.        Objective:   Physical Exam  Constitutional: She is oriented to person, place, and time. She appears well-developed and well-nourished. No distress.  HENT:  Head: Normocephalic and atraumatic.  Right Ear: External ear normal.  Left Ear: External ear normal.  Cardiovascular: Normal rate, regular rhythm, normal heart sounds and intact distal pulses.  No murmur heard. Pulmonary/Chest: Effort normal and breath sounds normal. No respiratory distress. She has no wheezes. She has no rales. She exhibits no tenderness.  Musculoskeletal: She exhibits edema. She exhibits no tenderness.       Right ankle: She exhibits swelling.       Left ankle: She exhibits swelling.       Right foot: There is no bony tenderness.       Left foot: There is no swelling.  Palpable pedal pulses, brisk cap refill  Neurological: She is alert and oriented to person, place, and time.  Skin: Skin is warm and dry. No rash noted. She is not diaphoretic. No erythema. No pallor.  Psychiatric: She has a normal mood and affect. Her behavior is normal. Judgment and thought content normal.  Nursing note and vitals reviewed.     Assessment & Plan:   1. Hyperlipidemia, unspecified hyperlipidemia type   2. Medication refill   3.  Hypothyroidism, unspecified type   4. Essential hypertension   5. Follow-up exam   6. Healthcare maintenance   7. Ankle edema, bilateral     Essential hypertension BP at goal 116/76  HR 79 Continue amlodipine 2.5mg  QD, Furosemide 20mg  QD, K+ 20 mEq QD   Hypothyroidism Last TSH 10/2016- 4.340 Currently on levothyroxine 77mcg  Ankle edema, bilateral Reduce Na++ intake Recommend compression socks 8-1mmHg Elevated feet several times/day  Hyperlipidemia  Ref Range & Units 20mo ago 01/20/17  Cholesterol, Total 100 - 199 mg/dL 150   Triglycerides 0 - 149 mg/dL 98   HDL >39 mg/dL 64   VLDL Cholesterol Cal 5 - 40 mg/dL 20   LDL Calculated 0 - 99 mg/dL 66   Chol/HDL Ratio 0.0 - 4.4 ratio 2.3    LDL goal <70 Continue atorvastatin 10mg  QD Follow heart healthy diet  Healthcare maintenance Please continue all medications as directed. Continue to stay well hydrated and follow heart healthy diet- reduce salt intake. Recommend compression socks for lower extremity swelling, 8-72mmHg strength. Follow-up 6 months for chronic medical conditions/fasting labs.    FOLLOW-UP:  Return in about 6 months (around 11/01/2017) for Regular Follow Up, Fasting Labs.

## 2017-05-02 ENCOUNTER — Ambulatory Visit: Payer: Medicare HMO | Admitting: Adult Health

## 2017-05-02 ENCOUNTER — Encounter: Payer: Self-pay | Admitting: Adult Health

## 2017-05-02 ENCOUNTER — Other Ambulatory Visit: Payer: Self-pay | Admitting: Adult Health

## 2017-05-02 VITALS — BP 116/76 | HR 64 | Ht 64.25 in | Wt 154.4 lb

## 2017-05-02 DIAGNOSIS — E039 Hypothyroidism, unspecified: Secondary | ICD-10-CM

## 2017-05-02 DIAGNOSIS — I10 Essential (primary) hypertension: Secondary | ICD-10-CM

## 2017-05-02 DIAGNOSIS — E785 Hyperlipidemia, unspecified: Secondary | ICD-10-CM | POA: Diagnosis not present

## 2017-05-02 DIAGNOSIS — Z0000 Encounter for general adult medical examination without abnormal findings: Secondary | ICD-10-CM | POA: Diagnosis not present

## 2017-05-02 DIAGNOSIS — Z09 Encounter for follow-up examination after completed treatment for conditions other than malignant neoplasm: Secondary | ICD-10-CM

## 2017-05-02 DIAGNOSIS — Z760 Encounter for issue of repeat prescription: Secondary | ICD-10-CM | POA: Diagnosis not present

## 2017-05-02 DIAGNOSIS — M25472 Effusion, left ankle: Secondary | ICD-10-CM | POA: Diagnosis not present

## 2017-05-02 DIAGNOSIS — M25471 Effusion, right ankle: Secondary | ICD-10-CM | POA: Diagnosis not present

## 2017-05-02 MED ORDER — POTASSIUM CHLORIDE CRYS ER 20 MEQ PO TBCR: EXTENDED_RELEASE_TABLET | ORAL | 3 refills | Status: DC

## 2017-05-02 MED ORDER — FLUOXETINE HCL 10 MG PO CAPS: 10.0000 mg | ORAL_CAPSULE | Freq: Every day | ORAL | 2 refills | Status: DC

## 2017-05-02 MED ORDER — ATORVASTATIN CALCIUM 10 MG PO TABS: 10.0000 mg | ORAL_TABLET | Freq: Every day | ORAL | 1 refills | Status: DC

## 2017-05-02 MED ORDER — UMECLIDINIUM-VILANTEROL 62.5-25 MCG/INH IN AEPB: 1.0000 | INHALATION_SPRAY | Freq: Every day | RESPIRATORY_TRACT | 3 refills | Status: DC

## 2017-05-02 MED ORDER — CYCLOBENZAPRINE HCL 5 MG PO TABS: 0.5000 mg | ORAL_TABLET | Freq: Every evening | ORAL | 1 refills | Status: DC | PRN

## 2017-05-02 MED ORDER — LEVOTHYROXINE SODIUM 50 MCG PO TABS: ORAL_TABLET | ORAL | 3 refills | Status: DC

## 2017-05-02 MED ORDER — FUROSEMIDE 20 MG PO TABS
20.0000 mg | ORAL_TABLET | Freq: Every day | ORAL | 3 refills | Status: DC
Start: 2017-05-02 — End: 2018-05-09

## 2017-05-02 MED ORDER — AMLODIPINE BESYLATE 2.5 MG PO TABS: 2.5000 mg | ORAL_TABLET | Freq: Every day | ORAL | 3 refills | Status: DC

## 2017-05-02 NOTE — Assessment & Plan Note (Signed)
Last TSH 10/2016- 4.340 Currently on levothyroxine 64mcg

## 2017-05-02 NOTE — Assessment & Plan Note (Signed)
Please continue all medications as directed. Continue to stay well hydrated and follow heart healthy diet- reduce salt intake. Recommend compression socks for lower extremity swelling, 8-56mmHg strength. Follow-up 6 months for chronic medical conditions/fasting labs.

## 2017-05-02 NOTE — Assessment & Plan Note (Signed)
Reduce Na++ intake Recommend compression socks 8-28mmHg Elevated feet several times/day

## 2017-05-02 NOTE — Assessment & Plan Note (Signed)
BP at goal 116/76  HR 79 Continue amlodipine 2.5mg  QD, Furosemide 20mg  QD, K+ 20 mEq QD

## 2017-05-02 NOTE — Assessment & Plan Note (Signed)
Ref Range & Units 71mo ago 01/20/17  Cholesterol, Total 100 - 199 mg/dL 150   Triglycerides 0 - 149 mg/dL 98   HDL >39 mg/dL 64   VLDL Cholesterol Cal 5 - 40 mg/dL 20   LDL Calculated 0 - 99 mg/dL 66   Chol/HDL Ratio 0.0 - 4.4 ratio 2.3    LDL goal <70 Continue atorvastatin 10mg  QD Follow heart healthy diet

## 2017-05-02 NOTE — Patient Instructions (Signed)
Heart-Healthy Eating Plan Heart-healthy meal planning includes:  Limiting unhealthy fats.  Increasing healthy fats.  Making other small dietary changes.  You may need to talk with your doctor or a diet specialist (dietitian) to create an eating plan that is right for you. What types of fat should I choose?  Choose healthy fats. These include olive oil and canola oil, flaxseeds, walnuts, almonds, and seeds.  Eat more omega-3 fats. These include salmon, mackerel, sardines, tuna, flaxseed oil, and ground flaxseeds. Try to eat fish at least twice each week.  Limit saturated fats. ? Saturated fats are often found in animal products, such as meats, butter, and cream. ? Plant sources of saturated fats include palm oil, palm kernel oil, and coconut oil.  Avoid foods with partially hydrogenated oils in them. These include stick margarine, some tub margarines, cookies, crackers, and other baked goods. These contain trans fats. What general guidelines do I need to follow?  Check food labels carefully. Identify foods with trans fats or high amounts of saturated fat.  Fill one half of your plate with vegetables and green salads. Eat 4-5 servings of vegetables per day. A serving of vegetables is: ? 1 cup of raw leafy vegetables. ?  cup of raw or cooked cut-up vegetables. ?  cup of vegetable juice.  Fill one fourth of your plate with whole grains. Look for the word "whole" as the first word in the ingredient list.  Fill one fourth of your plate with lean protein foods.  Eat 4-5 servings of fruit per day. A serving of fruit is: ? One medium whole fruit. ?  cup of dried fruit. ?  cup of fresh, frozen, or canned fruit. ?  cup of 100% fruit juice.  Eat more foods that contain soluble fiber. These include apples, broccoli, carrots, beans, peas, and barley. Try to get 20-30 g of fiber per day.  Eat more home-cooked food. Eat less restaurant, buffet, and fast food.  Limit or avoid  alcohol.  Limit foods high in starch and sugar.  Avoid fried foods.  Avoid frying your food. Try baking, boiling, grilling, or broiling it instead. You can also reduce fat by: ? Removing the skin from poultry. ? Removing all visible fats from meats. ? Skimming the fat off of stews, soups, and gravies before serving them. ? Steaming vegetables in water or broth.  Lose weight if you are overweight.  Eat 4-5 servings of nuts, legumes, and seeds per week: ? One serving of dried beans or legumes equals  cup after being cooked. ? One serving of nuts equals 1 ounces. ? One serving of seeds equals  ounce or one tablespoon.  You may need to keep track of how much salt or sodium you eat. This is especially true if you have high blood pressure. Talk with your doctor or dietitian to get more information. What foods can I eat? Grains Breads, including French, white, pita, wheat, raisin, rye, oatmeal, and Italian. Tortillas that are neither fried nor made with lard or trans fat. Low-fat rolls, including hotdog and hamburger buns and English muffins. Biscuits. Muffins. Waffles. Pancakes. Light popcorn. Whole-grain cereals. Flatbread. Melba toast. Pretzels. Breadsticks. Rusks. Low-fat snacks. Low-fat crackers, including oyster, saltine, matzo, graham, animal, and rye. Rice and pasta, including brown rice and pastas that are made with whole wheat. Vegetables All vegetables. Fruits All fruits, but limit coconut. Meats and Other Protein Sources Lean, well-trimmed beef, veal, pork, and lamb. Chicken and turkey without skin. All fish and shellfish.   Wild duck, rabbit, pheasant, and venison. Egg whites or low-cholesterol egg substitutes. Dried beans, peas, lentils, and tofu. Seeds and most nuts. Dairy Low-fat or nonfat cheeses, including ricotta, string, and mozzarella. Skim or 1% milk that is liquid, powdered, or evaporated. Buttermilk that is made with low-fat milk. Nonfat or low-fat  yogurt. Beverages Mineral water. Diet carbonated beverages. Sweets and Desserts Sherbets and fruit ices. Honey, jam, marmalade, jelly, and syrups. Meringues and gelatins. Pure sugar candy, such as hard candy, jelly beans, gumdrops, mints, marshmallows, and small amounts of dark chocolate. [employer] Eat all sweets and desserts in moderation. Fats and Oils Nonhydrogenated (trans-free) margarines. Vegetable oils, including soybean, sesame, sunflower, olive, peanut, safflower, corn, canola, and cottonseed. Salad dressings or mayonnaise made with a vegetable oil. Limit added fats and oils that you use for cooking, baking, salads, and as spreads. Other Cocoa powder. Coffee and tea. All seasonings and condiments. The items listed above may not be a complete list of recommended foods or beverages. Contact your dietitian for more options. What foods are not recommended? Grains Breads that are made with saturated or trans fats, oils, or whole milk. Croissants. Butter rolls. Cheese breads. Sweet rolls. Donuts. Buttered popcorn. Chow mein noodles. High-fat crackers, such as cheese or butter crackers. Meats and Other Protein Sources Fatty meats, such as hotdogs, short ribs, sausage, spareribs, bacon, rib eye roast or steak, and mutton. High-fat deli meats, such as salami and bologna. Caviar. Domestic duck and goose. Organ meats, such as kidney, liver, sweetbreads, and heart. Dairy Cream, sour cream, cream cheese, and creamed cottage cheese. Whole-milk cheeses, including blue (bleu), Monte[place] Jack, [place], [place], American, [place], Swiss, cheddar, [place], and [place]. Whole or 2% milk that is liquid, evaporated, or condensed. Whole buttermilk. Cream sauce or high-fat cheese sauce. Yogurt that is made from whole milk. Beverages Regular sodas and juice drinks with added sugar. Sweets and Desserts Frosting. Pudding. Cookies. Cakes other than angel food cake. Candy that has milk chocolate or white  chocolate, hydrogenated fat, butter, coconut, or unknown ingredients. Buttered syrups. Full-fat ice cream or ice cream drinks. Fats and Oils Gravy that has suet, meat fat, or shortening. Cocoa butter, hydrogenated oils, palm oil, coconut oil, palm kernel oil. These can often be found in baked products, candy, fried foods, nondairy creamers, and whipped toppings. Solid fats and shortenings, including bacon fat, salt pork, lard, and butter. Nondairy cream substitutes, such as coffee creamers and sour cream substitutes. Salad dressings that are made of unknown oils, cheese, or sour cream. The items listed above may not be a complete list of foods and beverages to avoid. Contact your dietitian for more information. This information is not intended to replace advice given to you by your health care provider. Make sure you discuss any questions you have with your health care provider. Document Released: 07/13/2011 Document Revised: 06/19/2015 Document Reviewed: 07/05/2013 Elsevier Interactive Patient Education  2018 [employer].   How to Use Compression Stockings Compression stockings are elastic socks that squeeze the legs. They help to increase blood flow to the legs, decrease swelling in the legs, and reduce the chance of developing blood clots in the lower legs. Compression stockings are often used by people who:  Are recovering from surgery.  Have poor circulation in their legs.  Are prone to getting blood clots in their legs.  Have varicose veins.  Sit or stay in bed for long periods of time.  How to use compression stockings Before you put on your compression stockings:  Make sure that they are the correct size. If you do not know your size, ask your health care provider.  Make sure that they are clean, dry, and in good condition.  Check them for rips and tears. Do not put them on if they are ripped or torn.  Put your stockings on first thing in the morning, before you get out of  bed. Keep them on for as long as your health care provider advises. When you are wearing your stockings:  Keep them as smooth as possible. Do not allow them to bunch up. It is especially important to prevent the stockings from bunching up around your toes or behind your knees.  Do not roll the stockings downward and leave them rolled down. This can decrease blood flow to your leg.  Change them right away if they become wet or dirty.  When you take off your stockings, inspect your legs and feet. Anything that does not seem normal may require medical attention. Look for:  Open sores.  Red spots.  Swelling.  Information and tips  Do not stop wearing your compression stockings without talking to your health care provider first.  Wash your stockings every day with mild detergent in cold or warm water. Do not use bleach. Air-dry your stockings or dry them in a clothes dryer on low heat.  Replace your stockings every 3-6 months.  If skin moisturizing is part of your treatment plan, apply lotion or cream at night so that your skin will be dry when you put on the stockings in the morning. It is harder to put the stockings on when you have lotion on your legs or feet. Contact a health care provider if: Remove your stockings and seek medical care if:  You have a feeling of pins and needles in your feet or legs.  You have any new changes in your skin.  You have skin lesions that are getting worse.  You have swelling or pain that is getting worse.  Get help right away if:  You have numbness or tingling in your lower legs that does not get better right after you take the stockings off.  Your toes or feet become cold and blue.  You develop open sores or red spots on your legs that do not go away.  You see or feel a warm spot on your leg.  You have new swelling or soreness in your leg.  You are short of breath or you have chest pain for no reason.  You have a rapid or irregular  heartbeat.  You feel light-headed or dizzy. This information is not intended to replace advice given to you by your health care provider. Make sure you discuss any questions you have with your health care provider. Document Released: 11/08/2008 Document Revised: 06/11/2015 Document Reviewed: 12/19/2013 Elsevier Interactive Patient Education  [employer].  Please continue all medications as directed. Continue to stay well hydrated and follow heart healthy diet- reduce salt intake. Recommend compression socks for lower extremity swelling, 8-33mmHg strength. Follow-up 6 months for chronic medical conditions/fasting labs. NICE TO SEE YOU!

## 2017-05-24 ENCOUNTER — Telehealth: Payer: Self-pay | Admitting: Adult Health

## 2017-05-24 NOTE — Telephone Encounter (Signed)
LVM for [facility] to return call.  Donna Arias, CMA

## 2017-05-24 NOTE — Telephone Encounter (Signed)
Good Morning Tonya, Can you please call family and ask for specific sx's. If she is acutely ill she should be seen/evaluated, not just sx treatment. Thanks! Valetta Fuller

## 2017-05-24 NOTE — Telephone Encounter (Signed)
Michiel Sites pt's daughter called to request Rx refill of:   benzonatate (TESSALON PERLES) 100 MG capsule [[id number]] ENDED   Order Details  Dose: 100 mg Route: Oral Frequency: 3 times daily PRN for cough  Indications of Use: Cough  Dispense Quantity: 30 capsule Refills: 0 Fills remaining: --        Sig: Take 1 capsule (100 mg total) by mouth 3 (three) times daily as needed for cough.     Please send to :  Preferred Pharmacies      [place], [place] - [place] [phone] (Phone) [phone] (Fax)    ----Patient & family going out of town to [place] for a week and has been coughing spasms.  Call [name] if any questions @ [phone].  --glh

## 2017-05-24 NOTE — Telephone Encounter (Signed)
Pt's daughter, [name], states that they are planning a trip to [place] next week and she would like to have an RX for Tessalon just in case Donna Arias develops a cough while on the trip.  Please advise.  Charyl Bigger, CMA

## 2017-05-24 NOTE — Telephone Encounter (Signed)
Please review and refill if appropriate.  T. Nelson, CMA  

## 2017-05-25 ENCOUNTER — Other Ambulatory Visit: Payer: Self-pay | Admitting: Adult Health

## 2017-05-25 MED ORDER — BENZONATATE 100 MG PO CAPS: 100.0000 mg | ORAL_CAPSULE | Freq: Two times a day (BID) | ORAL | 0 refills | Status: DC | PRN

## 2017-05-25 NOTE — Telephone Encounter (Signed)
Good Morning Tonya, Can you please call Donna Arias and share- I sent in short rx of Tessalon. Please tell her to have a nice trip to [place]. Thanks! Valetta Fuller

## 2017-05-25 NOTE — Telephone Encounter (Signed)
Pt's daughter informed.  Charyl Bigger, CMA

## 2017-06-23 ENCOUNTER — Ambulatory Visit: Payer: Medicare HMO | Admitting: Family Medicine

## 2017-06-23 ENCOUNTER — Encounter: Payer: Self-pay | Admitting: Family Medicine

## 2017-06-23 DIAGNOSIS — D229 Melanocytic nevi, unspecified: Secondary | ICD-10-CM | POA: Insufficient documentation

## 2017-06-23 NOTE — Patient Instructions (Signed)
Half hydrogen peroxide half rubbing alcohol solution justpour a little bit into both ears 2 days/week to keep clean. No Q-tips etc.  -Please call the office of Dr. Clydie Braun and [facility] or the office of Dr. Danella Sensing etc. for the dermatology office visit.

## 2017-06-23 NOTE — Progress Notes (Signed)
Pt here for an acute care OV today   Impression and Recommendations:    1. Multiple atypical nevi     1. Skin Growth on Left Cheek - STRONGLY advised patient to visit dermatology.  Patient and her daughter know to call to schedule an appointment.  - Advised the patient to avoid touching or scratching the area.  Advised use of bandage and ointment over the area to prevent further irritation.  - Discussed with patient and her daughter that they do not need to use bacitracin/neomycin on the area, especially not if it irritates her skin; but continued application of bandage over the area advised to protect the region.  2. Bilateral Ear Care - Begin using mixture of half hydrogen peroxide, half rubbing alcohol to clean the ears 2 days per week.  - Do not use Q-tips or anything inserted into the ear.  Indication: Cerumen impaction of the ear(s) bilaterally Medical necessity statement:  On physical examination, cerumen impairs clinically significant portions of the external auditory canal, and tympanic membrane.  Noted obstructive, copious cerumen that cannot be removed without magnification and instrumentations requiring physician skills Consent:  Discussed benefits and risks of procedure and verbal consent obtained Procedure:   Patient was prepped for the procedure.  Utilized an otoscope to assess and take note of the ear canal, the tympanic membrane, and the presence, amount, and placement of the cerumen. Gentle water irrigation was performed, and soft plastic curette was utilized by physician to remove cerumen.  Post procedure examination:  shows cerumen was removed, without trauma or injury to the ear canal or TM, which remains intact.   Post-Procedural Ear Care Instructions:    Patient tolerated procedure well.  Proper ear care d/c pt.   The patient is made aware that they may experience temporary vertigo, temporary hearing loss, and temporary discomfort.  If these symptom last for  more than 24 hours to call the clinic or proceed to the ED/Urgent Care.   No orders of the defined types were placed in this encounter.   No orders of the defined types were placed in this encounter.    Education and routine counseling performed. Handouts provided  Gross side effects, risk and benefits, and alternatives of medications and treatment plan in general discussed with patient.  Patient is aware that all medications have potential side effects and we are unable to predict every side effect or drug-drug interaction that may occur.   Patient will call with any questions prior to using medication if they have concerns.  Expresses verbal understanding and consents to current therapy and treatment regimen.  No barriers to understanding were identified.  Red flag symptoms and signs discussed in detail.  Patient expressed understanding regarding what to do in case of emergency\urgent symptoms   Please see AVS handed out to patient at the end of our visit for further patient instructions/ counseling done pertaining to today's office visit.   Return for f/up with your PCP as previously discussed.     Note: This document was prepared occasionally using Dragon voice recognition software and may include unintentional dictation errors in addition to a scribe.  This document serves as a record of services personally performed by Mellody Dance, DO. It was created on her behalf by Toni Amend, a trained medical scribe. The creation of this record is based on the scribe's personal observations and the provider's statements to them.   I have reviewed the above medical documentation for accuracy and completeness  and I concur.  Mellody Dance 06/30/17 12:12 PM   ---------------------------------------------------------------------------------------------------------------------------------------------   Subjective:    CC:  Chief Complaint  Patient presents with  . Facial  Laceration  . Ear Pain    HPI: Donna Arias is a 84 y.o. female who presents to [facility] today for issues as discussed below.  Skin Growth on Left Cheek The patient has a spot on her left cheek which, per daughter, has been bothering her for at least a year.  Patient's daughter describes the skin growth/lesion and notes that it's changed over time.  Notes that patient did not want to go to dermatology in the past to have the spot evaluated.    Patient woke up Wednesday morning (yesterday) and it was bleeding.  This past time it erupted, patient's daughter notes that it got sore, beginning as "little blistery bumps," and then "cemented over," "and now it's coming off."  Ear Pain, Bilateral Patient is experiencing itching and pain in both ears.  Symptoms started 4-5 days ago over the weekend, with pain and itching.   No problems updated.   Wt Readings from Last 3 Encounters:  06/23/17 153 lb 6.4 oz (69.6 kg)  05/02/17 154 lb 6.4 oz (70 kg)  02/28/17 149 lb (67.6 kg)   BP Readings from Last 3 Encounters:  06/23/17 122/81  05/02/17 116/76  02/02/17 112/74   BMI Readings from Last 3 Encounters:  06/23/17 26.13 kg/m  05/02/17 26.30 kg/m  02/28/17 25.38 kg/m     Patient Care Team    Relationship Specialty Notifications Start End  Esaw Grandchild, NP PCP - General Family Medicine  04/14/16      Patient Active Problem List   Diagnosis Date Noted  . Multiple atypical nevi 06/23/2017  . Ankle edema, bilateral 05/02/2017  . Postnasal drip 12/13/2016  . Acute bronchitis 12/13/2016  . Orthostatic hypotension 11/22/2016  . Hyperlipidemia 11/22/2016  . Anxiety 11/22/2016  . Dysarthria 11/22/2016  . Weight gain 11/18/2016  . Edema of right foot 11/18/2016  . Age-related osteoporosis without current pathological fracture 11/16/2016  . Cough in adult 11/16/2016  . Healthcare maintenance 10/14/2016  . Diaper dermatitis 06/10/2016  .  Antibiotic-induced yeast infection 06/10/2016  . Hearing loss secondary to cerumen impaction, bilateral 06/10/2016  . Chronic bilateral low back pain 04/19/2016  . Chronic neck pain 04/19/2016  . Medication refill 04/14/2016  . Joint laxity of right knee 04/14/2016  . Hypothyroidism 02/04/2016  . Vaginitis 07/19/2011  . Upper back pain 08/01/2009  . Transient cerebral ischemia 09/22/2007  . Dementia with behavioral disturbance 10/07/2006  . Essential hypertension 10/07/2006  . COPD (chronic obstructive pulmonary disease) ([place]) 10/07/2006  . GERD 10/07/2006    Past Medical history, Surgical history, Family history, Social history, Allergies and Medications have been entered into the medical record, reviewed and changed as needed.    Current Meds  Medication Sig  . albuterol (PROVENTIL HFA;VENTOLIN HFA) 108 (90 Base) MCG/ACT inhaler Inhale 2 puffs into the lungs every 6 (six) hours as needed.  [name] albuterol (PROVENTIL) (2.5 MG/3ML) 0.083% nebulizer solution Take 3 mLs (2.5 mg total) by nebulization every 6 (six) hours as needed for wheezing or shortness of breath.  [name] amLODipine (NORVASC) 2.5 MG tablet Take 1 tablet (2.5 mg total) by mouth daily.  [name] aspirin 81 MG tablet Take 81 mg by mouth daily.  [name] atorvastatin (LIPITOR) 10 MG tablet Take 1 tablet (10 mg total) by mouth daily.  [name]  Calcium Carbonate-Vit D-Min (CALCIUM 1200 PO) Take 1 tablet by mouth.  . cyclobenzaprine (FLEXERIL) 5 MG tablet Take 0.5 tablets (2.5 mg total) by mouth at bedtime as needed for muscle spasms.  [name] FLUoxetine (PROZAC) 10 MG capsule Take 1 capsule (10 mg total) by mouth daily.  . fluticasone (FLONASE) 50 MCG/ACT nasal spray Place 1 spray 2 (two) times daily into both nostrils.  . furosemide (LASIX) 20 MG tablet Take 1 tablet (20 mg total) by mouth daily.  [name] ketoconazole (NIZORAL) 2 % shampoo Apply 1 application topically 2 (two) times a week.  . levothyroxine (SYNTHROID, LEVOTHROID) 50 MCG tablet TAKE ONE (1) TABLET  BY MOUTH EVERY DAY  . loratadine (CLARITIN) 10 MG tablet Take 10 mg by mouth daily.  . potassium chloride SA (K-DUR,KLOR-CON) 20 MEQ tablet TAKE 1 TABLET BY MOUTH ONCE DAILY  . risperiDONE (RISPERDAL) 1 MG tablet Take 1 tablet (1 mg total) by mouth 2 (two) times daily.  [name] triamcinolone cream (KENALOG) 0.1 % Apply 1 application topically 2 (two) times daily.  [name] umeclidinium-vilanterol (ANORO ELLIPTA) 62.5-25 MCG/INH AEPB Inhale 1 puff into the lungs daily.    Allergies:  Allergies  Allergen Reactions  . Codeine Sulfate     REACTION: unspecified     Review of Systems: General:   Denies fever, chills, unexplained weight loss.  Optho/Auditory:   Denies visual changes, blurred vision/LOV Respiratory:   Denies wheeze, DOE more than baseline levels.  Cardiovascular:   Denies chest pain, palpitations, new onset peripheral edema  Gastrointestinal:   Denies nausea, vomiting, diarrhea, abd pain.  Genitourinary: Denies dysuria, freq/ urgency, flank pain or discharge from genitals.  Endocrine:     Denies hot or cold intolerance, polyuria, polydipsia. Musculoskeletal:   Denies unexplained myalgias, joint swelling, unexplained arthralgias, gait problems.  Skin:  Denies new onset rash, suspicious lesions Neurological:     Denies dizziness, unexplained weakness, numbness  Psychiatric/Behavioral:   Denies mood changes, suicidal or homicidal ideations, hallucinations    Objective:   Blood pressure 122/81, pulse 64, height 5' 4.25" (1.632 m), weight 153 lb 6.4 oz (69.6 kg), SpO2 97 %. Body mass index is 26.13 kg/m. General:  Well Developed, well nourished, appropriate for stated age.  Neuro:  Alert and oriented,  extra-ocular muscles intact  HEENT:  Normocephalic, atraumatic, neck supple Skin:  Hyperkeratotic skin lesion on the left cheek, approximately 2.5 sonometer to 3 sonometer diameter with central fissure.  Appears to be healing from recent bleed.  Several other atypical keratoses throughout  her face, neck, arms, etc.  Otherwise, no gross rash, warm, pink. Cardiac:  RRR, S1 S2 Respiratory:  ECTA B/L and A/P, Not using accessory muscles, speaking in full sentences- unlabored. Vascular:  Ext warm, no cyanosis apprec.; cap RF less 2 sec. Psych:  No HI/SI, judgement and insight good, Euthymic mood. Full Affect.

## 2017-06-24 ENCOUNTER — Telehealth: Payer: Self-pay | Admitting: Adult Health

## 2017-06-24 NOTE — Telephone Encounter (Signed)
Please review for appropriateness of "urgent" referral.  Charyl Bigger, CMA

## 2017-06-24 NOTE — Telephone Encounter (Signed)
Pt's daughter/ Tia Alert called states she called both Dermatologist office for appt they are booked out till Oct 2019--- Dr. Danella Sensing requires office notes & an Urgent referral request from provider before appt can be secured.  --forwarding message to medical assistant for review w/ Dr. Raliegh Scarlet.  --glh

## 2017-06-27 NOTE — Telephone Encounter (Signed)
I don't have a reason for an urgent referral.   She was referred to derm by her PCP in past but pt refused to go.  They can call around to various other places - to see if they can get in sooner.  Please tel them sorry.

## 2017-06-28 ENCOUNTER — Telehealth: Payer: Self-pay | Admitting: Adult Health

## 2017-06-28 NOTE — Telephone Encounter (Signed)
Called and left message to call the office back. MPulliam, CMA/RT(R)  

## 2017-06-28 NOTE — Telephone Encounter (Signed)
Patient's daughter called states she is returning Melissa's message to call office.  --forwarding to medical assistant.  -glh

## 2017-06-29 NOTE — Telephone Encounter (Signed)
Patient's  Daughter notified. MPulliam, CMA/RT(R)

## 2017-06-29 NOTE — Telephone Encounter (Signed)
Called daughter back and notified of pervious message. MPulliam, CMA/RT(R)

## 2017-07-26 DIAGNOSIS — C44329 Squamous cell carcinoma of skin of other parts of face: Secondary | ICD-10-CM | POA: Diagnosis not present

## 2017-07-26 DIAGNOSIS — L218 Other seborrheic dermatitis: Secondary | ICD-10-CM | POA: Diagnosis not present

## 2017-07-26 DIAGNOSIS — D485 Neoplasm of uncertain behavior of skin: Secondary | ICD-10-CM | POA: Diagnosis not present

## 2017-08-01 DIAGNOSIS — D0439 Carcinoma in situ of skin of other parts of face: Secondary | ICD-10-CM | POA: Diagnosis not present

## 2017-08-10 DIAGNOSIS — L570 Actinic keratosis: Secondary | ICD-10-CM | POA: Diagnosis not present

## 2017-08-26 NOTE — Progress Notes (Signed)
[facility]  PATIENT: Donna Arias DOB: [birth year]   REASON FOR VISIT: Follow-up for orthostatic hypotension HISTORY FROM: Patient and daughter [name]    HISTORY OF PRESENT ILLNESS:UPDATE 8/5/2019CM Donna Arias, 84 year old female returns for follow-up with a history of orthostatic hypotension.  She has not had any further episodes of dizziness or passing out.  This is only happened 3 times since 2015.  She now stays well-hydrated.  Daughter also says she checks her blood pressure at home and it has been fairly stable when changing positions.  She remains on aspirin and Lipitor for secondary stroke prevention.  She only walks in the house, going outside she walks with assistance.  No new neurologic complaints.  She returns for reevaluation   UPDATE 2/4/2019CM Donna Arias, 84 year old female returns for follow-up with a history of orthostatic hypotension.  She has not had any episodes of dizziness passing out.  She now knows to slowly get up when changing positions.  Her blood pressure continues to drop when she goes from lying to seated to standing.  She remains on aspirin and Lipitor for secondary stroke prevention she walks around the house for exercise.  She lives with her daughter.  She has increased her water intake since last seen .  She returns for reevaluation  11/22/16 Dr. Karsten Ro is a 84 y.o. female with PMH of HTN, HLD, anxiety, "TIAs" who presents as a new patient for history of "TIAs".   Daughter stated that pt in 1990s to 2000s had several episodes of sudden onset lightheadedness, feeling of passing out and hot, diffuse diaphoresis, looks really sick, needs to sit down or lie down. Lasting a few minutes and resolved. It has happened 3 times since she lives with daughter since 67. Last Wednesday she was in bathroom and had again hot feeling, nauseous, daughter was afraid of another "TIA". She went to see her PCP and was referred over here.   Pt has  been following with PCP for right leg cramps with braces, intermittent swelling of right leg, will have LE venous doppler tomorrow. Still has right knee pain, on brace at night. Able to walk with cane or without device for short distance. She is on risperdol for behavior issue for many years, so far tolerating well.    REVIEW OF SYSTEMS: Full 14 system review of systems performed and notable only for those listed, all others are neg:  Constitutional: neg  Cardiovascular: neg Ear/Nose/Throat: neg  Skin: neg Eyes: neg Respiratory: neg Gastroitestinal: neg  Hematology/Lymphatic: neg  Endocrine: neg Musculoskeletal:neg Allergy/Immunology: neg Neurological: neg Psychiatric: neg Sleep : neg   ALLERGIES: Allergies  Allergen Reactions  . Codeine Sulfate     REACTION: unspecified    HOME MEDICATIONS: Outpatient Medications Prior to Visit  Medication Sig Dispense Refill  . albuterol (PROVENTIL HFA;VENTOLIN HFA) 108 (90 Base) MCG/ACT inhaler Inhale 2 puffs into the lungs every 6 (six) hours as needed. 1 Inhaler 1  . albuterol (PROVENTIL) (2.5 MG/3ML) 0.083% nebulizer solution Take 3 mLs (2.5 mg total) by nebulization every 6 (six) hours as needed for wheezing or shortness of breath. 75 mL 12  . amLODipine (NORVASC) 2.5 MG tablet Take 1 tablet (2.5 mg total) by mouth daily. 90 tablet 3  . aspirin 81 MG tablet Take 81 mg by mouth daily.    [name] atorvastatin (LIPITOR) 10 MG tablet Take 1 tablet (10 mg total) by mouth daily. 90 tablet 1  . benzonatate (TESSALON) 100 MG capsule Take 1  capsule (100 mg total) by mouth 2 (two) times daily as needed for cough. 10 capsule 0  . Calcium Carbonate-Vit D-Min (CALCIUM 1200 PO) Take 1 tablet by mouth.    . cyclobenzaprine (FLEXERIL) 5 MG tablet Take 0.5 tablets (2.5 mg total) by mouth at bedtime as needed for muscle spasms. 30 tablet 1  . FLUoxetine (PROZAC) 10 MG capsule Take 1 capsule (10 mg total) by mouth daily. 90 capsule 2  . fluticasone (FLONASE) 50  MCG/ACT nasal spray Place 1 spray 2 (two) times daily into both nostrils. 16 g 6  . furosemide (LASIX) 20 MG tablet Take 1 tablet (20 mg total) by mouth daily. 90 tablet 3  . ketoconazole (NIZORAL) 2 % shampoo Apply 1 application topically 2 (two) times a week. 120 mL 1  . levothyroxine (SYNTHROID, LEVOTHROID) 50 MCG tablet TAKE ONE (1) TABLET BY MOUTH EVERY DAY 90 tablet 3  . loratadine (CLARITIN) 10 MG tablet Take 10 mg by mouth daily.    . potassium chloride SA (K-DUR,KLOR-CON) 20 MEQ tablet TAKE 1 TABLET BY MOUTH ONCE DAILY 90 tablet 3  . risperiDONE (RISPERDAL) 1 MG tablet Take 1 tablet (1 mg total) by mouth 2 (two) times daily. 200 tablet 3  . triamcinolone cream (KENALOG) 0.1 % Apply 1 application topically 2 (two) times daily. 30 g 1  . umeclidinium-vilanterol (ANORO ELLIPTA) 62.5-25 MCG/INH AEPB Inhale 1 puff into the lungs daily. 1 each 3   No facility-administered medications prior to visit.     PAST MEDICAL HISTORY: Past Medical History:  Diagnosis Date  . COPD (chronic obstructive pulmonary disease) ([place])   . Dementia   . Dementia   . GERD (gastroesophageal reflux disease)   . Heart attack ([place]) 1998  . Hypertension   . Stroke ([place])   . Thyroid disease     PAST SURGICAL HISTORY: Past Surgical History:  Procedure Laterality Date  . BREAST LUMPECTOMY  1995   Left Breast  . DILATION AND CURETTAGE OF UTERUS  1975  . GALLBLADDER SURGERY  1993    FAMILY HISTORY: Family History  Problem Relation Age of Onset  . Hypertension Mother   . Stroke Mother   . Stroke Sister   . Stroke Sister   . Stroke Sister     SOCIAL HISTORY: Social History   Socioeconomic History  . Marital status: Widowed    Spouse name: Not on file  . Number of children: 1  . Years of education: Not on file  . Highest education level: Not on file  Occupational History  . Occupation: RETIRED  Social Needs  . Financial resource strain: Not on file  . Food insecurity:    Worry: Not on file      Inability: Not on file  . Transportation needs:    Medical: Not on file    Non-medical: Not on file  Tobacco Use  . Smoking status: Former Research scientist (life sciences)  . Smokeless tobacco: Never Used  Substance and Sexual Activity  . Alcohol use: No  . Drug use: No  . Sexual activity: Never    Birth control/protection: None  Lifestyle  . Physical activity:    Days per week: Not on file    Minutes per session: Not on file  . Stress: Not on file  Relationships  . Social connections:    Talks on phone: Not on file    Gets together: Not on file    Attends religious service: Not on file    Active member of club  or organization: Not on file    Attends meetings of clubs or organizations: Not on file    Relationship status: Not on file  . Intimate partner violence:    Fear of current or ex partner: Not on file    Emotionally abused: Not on file    Physically abused: Not on file    Forced sexual activity: Not on file  Other Topics Concern  . Not on file  Social History Narrative   CB x 1   Lives at home with her daughter (since 02/05/2013)   Right handed   Regular exercise - NO     PHYSICAL EXAM  Vitals:   08/29/17 0915  BP: 129/70  Pulse: 70  Weight: 155 lb 3.2 oz (70.4 kg)  Height: 5' 4.5" (1.638 m)   Body mass index is 26.23 kg/m.  Generalized: Well developed, in no acute distress  Head: normocephalic and atraumatic,. Oropharynx benign  Neck: Supple, no carotid bruits  Cardiac: Regular rate rhythm, no murmur  Musculoskeletal: No deformity   Neurological examination   Mentation: Alert oriented to time, place, history primarily from daughter [name] .[name] Recent and remote memory intact.  Follows all commands speech with mild dysarthria due to dentures  Cranial nerve II-XII: .Pupils were equal round reactive to light extraocular movements were full, visual field were full on confrontational test. Facial sensation and strength were normal. hearing was intact to finger rubbing  bilaterally. Uvula tongue midline. head turning and shoulder shrug were normal and symmetric.Tongue protrusion into cheek strength was normal. Motor: normal bulk and tone, full strength in the BUE, BLE, Sensory: normal and symmetric to light touch,  Coordination: finger-nose-finger,  no dysmetria, no tremor Reflexes: Symmetric upper and lower plantar responses were flexor bilaterally. Gait and Station: Rising up from seated position without assistance, normal stance,  moderate stride,  walks with stand by assist, no difficulty with turns, unable to tandem, no assistive device  DIAGNOSTIC DATA (LABS, IMAGING, TESTING) - I reviewed patient records, labs, notes, testing and imaging myself where available.  Lab Results  Component Value Date   WBC 5.3 10/25/2016   HGB 13.8 10/25/2016   HCT 41.7 10/25/2016   MCV 94 10/25/2016   PLT 187 10/25/2016      Component Value Date/Time   NA 147 (H) 10/25/2016 0946   K 4.4 10/25/2016 0946   CL 108 (H) 10/25/2016 0946   CO2 26 10/25/2016 0946   GLUCOSE 97 10/25/2016 0946   GLUCOSE 108 (H) 08/01/2015 0933   BUN 17 10/25/2016 0946   CREATININE 1.07 (H) 10/25/2016 0946   CALCIUM 9.4 10/25/2016 0946   PROT 6.7 10/25/2016 0946   ALBUMIN 4.1 10/25/2016 0946   AST 24 10/25/2016 0946   ALT 24 01/20/2017 0948   ALKPHOS 106 10/25/2016 0946   BILITOT 0.3 10/25/2016 0946   GFRNONAA 46 (L) 10/25/2016 0946   GFRAA 54 (L) 10/25/2016 0946   Lab Results  Component Value Date   CHOL 150 01/20/2017   HDL 64 01/20/2017   LDLCALC 66 01/20/2017   LDLDIRECT 148.4 04/14/2012   TRIG 98 01/20/2017   CHOLHDL 2.3 01/20/2017   Lab Results  Component Value Date   HGBA1C 5.5 10/25/2016   Lab Results  Component Value Date   VITAMINB12 321 10/25/2016   Lab Results  Component Value Date   TSH 4.340 10/25/2016      ASSESSMENT AND PLAN Donna Arias is a 84 y.o. female with PMH of HTN, HLD, anxiety, "TIAs" who  presents as a new patient for history of  "TIAs". "TIAs" were episodes of sudden onset lightheadedness, feeling of passing out and hot, diffuse diaphoresis, looks really sick, needs to sit down or lie down. Lasting a few minutes and resolved, typical for near syncope. Her orthostatic BP dropped 49 from lying to standing, consistent with the diagnosis.  .The patient is a current patient of Dr. Erlinda Hong  who is out of the office today . This note is sent to the work in doctor.  Patient now stays well-hydrated no further events.     Continue ASA and lipitor for stroke prevention Avoid dehydration. Stand up slowly and avoid to stand too long Check BP at home and record when an episode occurs sit or lie down until better Follow up with your primary care physician for stroke risk factor modification. Recommend maintain blood pressure goal <130/80, diabetes with hemoglobin A1c goal below 7.0% and lipids with LDL cholesterol goal below 70 mg/dL.  Discharge from stroke clinic.  I spent 25 minutes in total face to face time with the patient more than 50% of which was spent counseling and coordination of care, reviewing test results reviewing medications and discussing and reviewing the diagnosis of orthostatic hypotension, written information given to patient and daughter and this was reviewed with them and given a copy for further reference Dennie Bible, [facility], [facility], [facility] [address] [place], [place] [address] [phone]

## 2017-08-29 ENCOUNTER — Ambulatory Visit: Payer: Medicare HMO | Admitting: Nurse Practitioner

## 2017-08-29 ENCOUNTER — Encounter: Payer: Self-pay | Admitting: Nurse Practitioner

## 2017-08-29 VITALS — BP 129/70 | HR 70 | Ht 64.5 in | Wt 155.2 lb

## 2017-08-29 DIAGNOSIS — E785 Hyperlipidemia, unspecified: Secondary | ICD-10-CM

## 2017-08-29 DIAGNOSIS — I951 Orthostatic hypotension: Secondary | ICD-10-CM

## 2017-08-29 NOTE — Progress Notes (Signed)
I have reviewed and agreed above plan. 

## 2017-08-29 NOTE — Patient Instructions (Addendum)
Continue ASA and lipitor for stroke prevention Avoid dehydration. Stand up slowly and avoid to stand too long Check BP at home and record when an episode occurs sit or lie down until better Follow up with your primary care physician for stroke risk factor modification. Recommend maintain blood pressure goal <130/80, diabetes with hemoglobin A1c goal below 7.0% and lipids with LDL cholesterol goal below 70 mg/dL.  Discharge from stroke clinic.   Orthostatic Hypotension Orthostatic hypotension is a sudden drop in blood pressure that happens when you quickly change positions, such as when you get up from a seated or lying position. Blood pressure is a measurement of how strongly, or weakly, your blood is pressing against the walls of your arteries. Arteries are blood vessels that carry blood from your heart throughout your body. When blood pressure is too low, you may not get enough blood to your brain or to the rest of your organs. This can cause weakness, light-headedness, rapid heartbeat, and fainting. This can last for just a few seconds or for up to a few minutes. Orthostatic hypotension is usually not a serious problem. However, if it happens frequently or gets worse, it may be a sign of something more serious. What are the causes? This condition may be caused by:  Sudden changes in posture, such as standing up quickly after you have been sitting or lying down.  Blood loss.  Loss of body fluids (dehydration).  Heart problems.  Hormone (endocrine) problems.  Pregnancy.  Severe infection.  Lack of certain nutrients.  Severe allergic reactions (anaphylaxis).  Certain medicines, such as blood pressure medicine or medicines that make the body lose excess fluids (diuretics). Sometimes, this condition can be caused by not taking medicine as directed, such as taking too much of a certain medicine.  What increases the risk? Certain factors can make you more likely to develop orthostatic  hypotension, including:  Age. Risk increases as you get older.  Conditions that affect the heart or the central nervous system.  Taking certain medicines, such as blood pressure medicine or diuretics.  Being pregnant.  What are the signs or symptoms? Symptoms of this condition may include:  Weakness.  Light-headedness.  Dizziness.  Blurred vision.  Fatigue.  Rapid heartbeat.  Fainting, in severe cases.  How is this diagnosed? This condition is diagnosed based on:  Your medical history.  Your symptoms.  Your blood pressure measurement. Your health care provider will check your blood pressure when you are: ? Lying down. ? Sitting. ? Standing.  A blood pressure reading is recorded as two numbers, such as "120 over 80" (or 120/80). The first ("top") number is called the systolic pressure. It is a measure of the pressure in your arteries as your heart beats. The second ("bottom") number is called the diastolic pressure. It is a measure of the pressure in your arteries when your heart relaxes between beats. Blood pressure is measured in a unit called mm Hg. Healthy blood pressure for adults is 120/80. If your blood pressure is below 90/60, you may be diagnosed with hypotension. Other information or tests that may be used to diagnose orthostatic hypotension include:  Your other vital signs, such as your heart rate and temperature.  Blood tests.  Tilt table test. For this test, you will be safely secured to a table that moves you from a lying position to an upright position. Your heart rhythm and blood pressure will be monitored during the test.  How is this treated? Treatment for  this condition may include:  Changing your diet. This may involve eating more salt (sodium) or drinking more water.  Taking medicines to raise your blood pressure.  Changing the dosage of certain medicines you are taking that might be lowering your blood pressure.  Wearing compression  stockings. These stockings help to prevent blood clots and reduce swelling in your legs.  In some cases, you may need to go to the hospital for:  Fluid replacement. This means you will receive fluids through an IV tube.  Blood replacement. This means you will receive donated blood through an IV tube (transfusion).  Treating an infection or heart problems, if this applies.  Monitoring. You may need to be monitored while medicines that you are taking wear off.  Follow these instructions at home: Eating and drinking   Drink enough fluid to keep your urine clear or pale yellow.  Eat a healthy diet and follow instructions from your health care provider about eating or drinking restrictions. A healthy diet includes: ? Fresh fruits and vegetables. ? Whole grains. ? Lean meats. ? Low-fat dairy products.  Eat extra salt only as directed. Do not add extra salt to your diet unless your health care provider told you to do that.  Eat frequent, small meals.  Avoid standing up suddenly after eating. Medicines  Take over-the-counter and prescription medicines only as told by your health care provider. ? Follow instructions from your health care provider about changing the dosage of your current medicines, if this applies. ? Do not stop or adjust any of your medicines on your own. General instructions  Wear compression stockings as told by your health care provider.  Get up slowly from lying down or sitting positions. This gives your blood pressure a chance to adjust.  Avoid hot showers and excessive heat as directed by your health care provider.  Return to your normal activities as told by your health care provider. Ask your health care provider what activities are safe for you.  Do not use any products that contain nicotine or tobacco, such as cigarettes and e-cigarettes. If you need help quitting, ask your health care provider.  Keep all follow-up visits as told by your health care  provider. This is important. Contact a health care provider if:  You vomit.  You have diarrhea.  You have a fever for more than 2-3 days.  You feel more thirsty than usual.  You feel weak and tired. Get help right away if:  You have chest pain.  You have a fast or irregular heartbeat.  You develop numbness in any part of your body.  You cannot move your arms or your legs.  You have trouble speaking.  You become sweaty or feel lightheaded.  You faint.  You feel short of breath.  You have trouble staying awake.  You feel confused. This information is not intended to replace advice given to you by your health care provider. Make sure you discuss any questions you have with your health care provider. Document Released: 01/01/2002 Document Revised: 09/30/2015 Document Reviewed: 07/04/2015 Elsevier Interactive Patient Education  2018 [employer].

## 2017-09-15 DIAGNOSIS — L570 Actinic keratosis: Secondary | ICD-10-CM | POA: Diagnosis not present

## 2017-09-20 ENCOUNTER — Other Ambulatory Visit: Payer: Self-pay | Admitting: Adult Health

## 2017-09-20 DIAGNOSIS — Z760 Encounter for issue of repeat prescription: Secondary | ICD-10-CM

## 2017-09-29 ENCOUNTER — Other Ambulatory Visit: Payer: Self-pay | Admitting: Adult Health

## 2017-09-29 DIAGNOSIS — Z09 Encounter for follow-up examination after completed treatment for conditions other than malignant neoplasm: Secondary | ICD-10-CM

## 2017-10-17 DIAGNOSIS — D0439 Carcinoma in situ of skin of other parts of face: Secondary | ICD-10-CM | POA: Diagnosis not present

## 2017-10-17 DIAGNOSIS — C44329 Squamous cell carcinoma of skin of other parts of face: Secondary | ICD-10-CM | POA: Diagnosis not present

## 2017-10-27 ENCOUNTER — Other Ambulatory Visit: Payer: Self-pay | Admitting: Adult Health

## 2017-10-27 ENCOUNTER — Ambulatory Visit (INDEPENDENT_AMBULATORY_CARE_PROVIDER_SITE_OTHER): Payer: Medicare HMO

## 2017-10-27 VITALS — BP 103/68 | HR 69 | Temp 97.7°F

## 2017-10-27 DIAGNOSIS — Z23 Encounter for immunization: Secondary | ICD-10-CM

## 2017-10-31 NOTE — Progress Notes (Signed)
Subjective:    Patient ID: Donna Arias, female    DOB: 10/22/1926, 84 y.o.   MRN: [id number]  HPI:  05/02/17 OV: Donna Arias is here for f/u:HTN,HLD, Hypothyroidism, lower ext edema,  She reports medication compliance and denies SE She was seen by Neurology 02/28/17 for "lightheadedness" and hx of TIA She was advised to continue current rx regime and BP gaol <130/80, A1c<7, LDL goal 70mg /dL, neuro recommended f/u in 6 months. Only acute compliant today- minor swelling around ankles noted this morning. She had grilled cheese and tomato soup last night for dinner She denies CP/dyspnea/palpitations/increased fatigue. She reports strong appetite and sound sleep. Daughter at [facility] during [place]  10/819 OV: Donna Arias is here for 6 month f/u: HTN,HLD, Hypothyroidism, lower ext edema. She reports medication compliance and only has one acute complaint- Non-productive cough the last three days She denies CP/palpitations/fever/night sweats/chills She reports reduction in lower extremity edema and has been wearing compression stockings most days of the week She reports strong appetite and excellent sleep and denies any recent falls Daughter at [facility] during [place]  Patient Care Team    Relationship Specialty Notifications Start End  Esaw Grandchild, NP PCP - General Family Medicine  04/14/16     Patient Active Problem List   Diagnosis Date Noted  . Multiple atypical nevi 06/23/2017  . Ankle edema, bilateral 05/02/2017  . Postnasal drip 12/13/2016  . Acute bronchitis 12/13/2016  . Orthostatic hypotension 11/22/2016  . Hyperlipidemia 11/22/2016  . Anxiety 11/22/2016  . Dysarthria 11/22/2016  . Weight gain 11/18/2016  . Edema of right foot 11/18/2016  . Age-related osteoporosis without current pathological fracture 11/16/2016  . Cough in adult 11/16/2016  . Healthcare maintenance 10/14/2016  . Diaper dermatitis 06/10/2016  . Antibiotic-induced yeast infection 06/10/2016  . Hearing loss secondary to  cerumen impaction, bilateral 06/10/2016  . Chronic bilateral low back pain 04/19/2016  . Chronic neck pain 04/19/2016  . Medication refill 04/14/2016  . Joint laxity of right knee 04/14/2016  . Hypothyroidism 02/04/2016  . Vaginitis 07/19/2011  . Upper back pain 08/01/2009  . Transient cerebral ischemia 09/22/2007  . Dementia with behavioral disturbance ([place]) 10/07/2006  . Essential hypertension 10/07/2006  . COPD (chronic obstructive pulmonary disease) ([place]) 10/07/2006  . GERD 10/07/2006     Past Medical History:  Diagnosis Date  . Cancer (HCC)    squamous cell of left face  . COPD (chronic obstructive pulmonary disease) ([place])   . Dementia ([place])   . Dementia ([place])   . GERD (gastroesophageal reflux disease)   . Heart attack ([place]) 1998  . Hypertension   . Stroke ([place])   . Thyroid disease      Past Surgical History:  Procedure Laterality Date  . BREAST LUMPECTOMY  1995   Left Breast  . DILATION AND CURETTAGE OF UTERUS  1975  . GALLBLADDER SURGERY  1993     Family History  Problem Relation Age of Onset  . Hypertension Mother   . Stroke Mother   . Stroke Sister   . Stroke Sister   . Stroke Sister      Social History   Substance and Sexual Activity  Drug Use No     Social History   Substance and Sexual Activity  Alcohol Use No     Social History   Tobacco Use  Smoking Status Former Smoker  Smokeless Tobacco Never Used     Outpatient Encounter Medications as of 11/01/2017  Medication Sig  . albuterol (  PROVENTIL HFA;VENTOLIN HFA) 108 (90 Base) MCG/ACT inhaler Inhale 2 puffs into the lungs every 6 (six) hours as needed.  [name] albuterol (PROVENTIL) (2.5 MG/3ML) 0.083% nebulizer solution Take 3 mLs (2.5 mg total) by nebulization every 6 (six) hours as needed for wheezing or shortness of breath.  [name] amLODipine (NORVASC) 2.5 MG tablet Take 1 tablet (2.5 mg total) by mouth daily.  Jearl Klinefelter ELLIPTA 62.5-25 MCG/INH AEPB INHALE 1 PUFF INTO THE LUNGS DAILY.  [name]  aspirin 81 MG tablet Take 81 mg by mouth daily.  [name] atorvastatin (LIPITOR) 10 MG tablet TAKE 1 TABLET BY MOUTH DAILY.  . Calcium Carbonate-Vit D-Min (CALCIUM 1200 PO) Take 1 tablet by mouth.  . cyclobenzaprine (FLEXERIL) 5 MG tablet Take 0.5 tablets (2.5 mg total) by mouth at bedtime as needed for muscle spasms.  . fluticasone (FLONASE) 50 MCG/ACT nasal spray Place 1 spray 2 (two) times daily into both nostrils.  . furosemide (LASIX) 20 MG tablet Take 1 tablet (20 mg total) by mouth daily.  [name] ketoconazole (NIZORAL) 2 % shampoo APPLY TO AFFECTED AREA AS DIRECTED 2 TIMES A WEEK.  [name] levothyroxine (SYNTHROID, LEVOTHROID) 50 MCG tablet TAKE ONE (1) TABLET BY MOUTH EVERY DAY  . loratadine (CLARITIN) 10 MG tablet Take 10 mg by mouth daily.  . potassium chloride SA (K-DUR,KLOR-CON) 20 MEQ tablet TAKE 1 TABLET BY MOUTH ONCE DAILY  . risperiDONE (RISPERDAL) 1 MG tablet TAKE 1 TABLET BY MOUTH TWICE A DAY.  [name] triamcinolone cream (KENALOG) 0.1 % Apply 1 application topically 2 (two) times daily.  . benzonatate (TESSALON) 100 MG capsule Take 1 capsule (100 mg total) by mouth 2 (two) times daily as needed for cough. (Patient not taking: Reported on 11/01/2017)  . [DISCONTINUED] FLUoxetine (PROZAC) 10 MG capsule Take 1 capsule (10 mg total) by mouth daily.   No facility-administered encounter medications on file as of 11/01/2017.     Allergies: Codeine sulfate  Body mass index is 26.28 kg/m.  Blood pressure 134/81, pulse 75, height 5' 4.5" (1.638 m), weight 155 lb 8 oz (70.5 kg), SpO2 97 %.    Review of Systems  Constitutional: Negative for activity change, appetite change, chills, diaphoresis, fatigue, fever and unexpected weight change.  Respiratory: Negative for cough, chest tightness, shortness of breath, wheezing and stridor.   Cardiovascular: Positive for leg swelling. Negative for chest pain and palpitations.  Gastrointestinal: Negative for abdominal distention, abdominal pain, blood in stool,  constipation, diarrhea, nausea and vomiting.  Genitourinary: Negative for difficulty urinating, flank pain and hematuria.  Musculoskeletal: Positive for arthralgias, back pain, gait problem, joint swelling, myalgias, neck pain and neck stiffness.  Neurological: Negative for dizziness and headaches.  Hematological: Does not bruise/bleed easily.  Psychiatric/Behavioral: Negative for hallucinations, self-injury, sleep disturbance and suicidal ideas. The patient is not nervous/anxious and is not hyperactive.        Objective:   Physical Exam  Constitutional: She is oriented to person, place, and time. She appears well-developed and well-nourished. No distress.  HENT:  Head: Normocephalic and atraumatic.  Right Ear: External ear normal. Tympanic membrane is not erythematous and not bulging. Decreased hearing is noted.  Left Ear: External ear normal. Tympanic membrane is not erythematous and not bulging. Decreased hearing is noted.  Nose: Mucosal edema present. No rhinorrhea. Right sinus exhibits no maxillary sinus tenderness and no frontal sinus tenderness. Left sinus exhibits no maxillary sinus tenderness and no frontal sinus tenderness.  Mouth/Throat: Uvula is midline, oropharynx is clear and moist and mucous membranes are  normal. She has dentures.  Eyes: Pupils are equal, round, and reactive to light. Conjunctivae are normal.  Cardiovascular: Normal rate, regular rhythm, normal heart sounds and intact distal pulses.  No murmur heard. Pulmonary/Chest: Effort normal. No respiratory distress. She has no decreased breath sounds. She has wheezes in the right upper field. She has no rhonchi. She has no rales. She exhibits no tenderness.  Musculoskeletal: She exhibits edema. She exhibits no tenderness.       Right ankle: She exhibits swelling.       Left ankle: She exhibits swelling.       Right foot: There is no bony tenderness.       Left foot: There is no swelling.  Palpable pedal pulses, brisk  cap refill  Neurological: She is alert and oriented to person, place, and time.  Skin: Skin is warm and dry. No rash noted. She is not diaphoretic. No erythema. No pallor.  Psychiatric: She has a normal mood and affect. Her behavior is normal. Judgment and thought content normal.  Nursing note and vitals reviewed.     Assessment & Plan:   1. Essential hypertension   2. Healthcare maintenance   3. Dementia with behavioral disturbance, unspecified dementia type ([place])     Essential hypertension BP at goal 134/84, HR 75 Continue Amlodipine 2.56mg  QD, Furosemide 20mg  QD, K Dur 20 meq QD  Dementia with behavioral disturbance Continue Risperidone 1mg  BID Mood stable  Healthcare maintenance  Continue all medications as directed. Continue to drink plenty of fluids, follow Mediterranean diet. To help with cough/wheezing, please use Albuterol inhaler as directed. We will call you when lab results are available. Please schedule physical in 6 months.  Hyperlipidemia Fasting labs drawn today Currently taking Atorvastatin 10mg  QD    FOLLOW-UP:  Return in about 6 months (around 05/03/2018) for CPE.

## 2017-11-01 ENCOUNTER — Encounter: Payer: Self-pay | Admitting: Adult Health

## 2017-11-01 ENCOUNTER — Ambulatory Visit (INDEPENDENT_AMBULATORY_CARE_PROVIDER_SITE_OTHER): Payer: Medicare HMO | Admitting: Adult Health

## 2017-11-01 VITALS — BP 134/81 | HR 75 | Ht 64.5 in | Wt 155.5 lb

## 2017-11-01 DIAGNOSIS — E039 Hypothyroidism, unspecified: Secondary | ICD-10-CM | POA: Diagnosis not present

## 2017-11-01 DIAGNOSIS — I10 Essential (primary) hypertension: Secondary | ICD-10-CM | POA: Diagnosis not present

## 2017-11-01 DIAGNOSIS — E785 Hyperlipidemia, unspecified: Secondary | ICD-10-CM

## 2017-11-01 DIAGNOSIS — F0391 Unspecified dementia with behavioral disturbance: Secondary | ICD-10-CM | POA: Diagnosis not present

## 2017-11-01 DIAGNOSIS — Z0000 Encounter for general adult medical examination without abnormal findings: Secondary | ICD-10-CM | POA: Diagnosis not present

## 2017-11-01 NOTE — Assessment & Plan Note (Signed)
  Continue all medications as directed. Continue to drink plenty of fluids, follow Mediterranean diet. To help with cough/wheezing, please use Albuterol inhaler as directed. We will call you when lab results are available. Please schedule physical in 6 months.

## 2017-11-01 NOTE — Assessment & Plan Note (Signed)
Fasting labs drawn today Currently taking Atorvastatin 10mg  QD

## 2017-11-01 NOTE — Assessment & Plan Note (Signed)
Use Albuterol as needed

## 2017-11-01 NOTE — Patient Instructions (Signed)
Mediterranean Diet A Mediterranean diet refers to food and lifestyle choices that are based on the traditions of countries located on the [place]. This way of eating has been shown to help prevent certain conditions and improve outcomes for people who have chronic diseases, like kidney disease and heart disease. What are tips for following this plan? Lifestyle  Cook and eat meals together with your family, when possible.  Drink enough fluid to keep your urine clear or pale yellow.  Be physically active every day. This includes: ? Aerobic exercise like running or swimming. ? Leisure activities like gardening, walking, or housework.  Get 7-8 hours of sleep each night.  If recommended by your health care provider, drink red wine in moderation. This means 1 glass a day for nonpregnant women and 2 glasses a day for men. A glass of wine equals 5 oz (150 mL). Reading food labels  Check the serving size of packaged foods. For foods such as rice and pasta, the serving size refers to the amount of cooked product, not dry.  Check the total fat in packaged foods. Avoid foods that have saturated fat or trans fats.  Check the ingredients list for added sugars, such as corn syrup. Shopping  At the grocery store, buy most of your food from the areas near the walls of the store. This includes: ? Fresh fruits and vegetables (produce). ? Grains, beans, nuts, and seeds. Some of these may be available in unpackaged forms or large amounts (in bulk). ? Fresh seafood. ? Poultry and eggs. ? Low-fat dairy products.  Buy whole ingredients instead of prepackaged foods.  Buy fresh fruits and vegetables in-season from local farmers markets.  Buy frozen fruits and vegetables in resealable bags.  If you do not have access to quality fresh seafood, buy precooked frozen shrimp or canned fish, such as tuna, salmon, or sardines.  Buy small amounts of raw or cooked vegetables, salads, or olives from the  deli or salad bar at your store.  Stock your pantry so you always have certain foods on hand, such as olive oil, canned tuna, canned tomatoes, rice, pasta, and beans. Cooking  Cook foods with extra-virgin olive oil instead of using butter or other vegetable oils.  Have meat as a side dish, and have vegetables or grains as your main dish. This means having meat in small portions or adding small amounts of meat to foods like pasta or stew.  Use beans or vegetables instead of meat in common dishes like chili or lasagna.  Experiment with different cooking methods. Try roasting or broiling vegetables instead of steaming or sauteing them.  Add frozen vegetables to soups, stews, pasta, or rice.  Add nuts or seeds for added healthy fat at each meal. You can add these to yogurt, salads, or vegetable dishes.  Marinate fish or vegetables using olive oil, lemon juice, garlic, and fresh herbs. Meal planning  Plan to eat 1 vegetarian meal one day each week. Try to work up to 2 vegetarian meals, if possible.  Eat seafood 2 or more times a week.  Have healthy snacks readily available, such as: ? Vegetable sticks with hummus. ? Greek yogurt. ? Fruit and nut trail mix.  Eat balanced meals throughout the week. This includes: ? Fruit: 2-3 servings a day ? Vegetables: 4-5 servings a day ? Low-fat dairy: 2 servings a day ? Fish, poultry, or lean meat: 1 serving a day ? Beans and legumes: 2 or more servings a week ? Nuts   and seeds: 1-2 servings a day ? Whole grains: 6-8 servings a day ? Extra-virgin olive oil: 3-4 servings a day  Limit red meat and sweets to only a few servings a month What are my food choices?  Mediterranean diet ? Recommended ? Grains: Whole-grain pasta. Brown rice. Bulgar wheat. Polenta. Couscous. Whole-wheat bread. [place]. ? Vegetables: Artichokes. Beets. Broccoli. Cabbage. Carrots. Eggplant. Green beans. Chard. Kale. Spinach. Onions. Leeks. Peas. Squash.  Tomatoes. Peppers. Radishes. ? Fruits: Apples. Apricots. Avocado. Berries. Bananas. Cherries. Dates. Figs. Grapes. Lemons. Melon. Oranges. Peaches. Plums. Pomegranate. ? Meats and other protein foods: Beans. Almonds. Sunflower seeds. Pine nuts. Peanuts. [place]. Salmon. Scall[place]. Shrimp. [place]. Tilapia. Clams. Oysters. Eggs. ? Dairy: Low-fat milk. Cheese. Greek yogurt. ? Beverages: Water. Red wine. Herbal tea. ? Fats and oils: Extra virgin olive oil. Avocado oil. Grape seed oil. ? Sweets and desserts: [place] yogurt with honey. Baked apples. Poached pears. Trail mix. ? Seasoning and other foods: Basil. Cilantro. Coriander. Cumin. Mint. Parsley. Sage. Rosemary. Tarragon. Garlic. Oregano. Thyme. Pepper. Balsalmic vinegar. Tahini. Hummus. Tomato sauce. Olives. Mushrooms. ? Limit these ? Grains: Prepackaged pasta or rice dishes. Prepackaged cereal with added sugar. ? Vegetables: Deep fried potatoes (french fries). ? Fruits: Fruit canned in syrup. ? Meats and other protein foods: Beef. Pork. Lamb. Poultry with skin. Hot dogs. [place] Salines. ? Dairy: Ice cream. Sour cream. Whole milk. ? Beverages: Juice. Sugar-sweetened soft drinks. Beer. Liquor and spirits. ? Fats and oils: Butter. Canola oil. Vegetable oil. Beef fat (tallow). Lard. ? Sweets and desserts: Cookies. Cakes. Pies. Candy. ? Seasoning and other foods: Mayonnaise. Premade sauces and marinades. ? The items listed may not be a complete list. Talk with your dietitian about what dietary choices are right for you. Summary  The Mediterranean diet includes both food and lifestyle choices.  Eat a variety of fresh fruits and vegetables, beans, nuts, seeds, and whole grains.  Limit the amount of red meat and sweets that you eat.  Talk with your health care provider about whether it is safe for you to drink red wine in moderation. This means 1 glass a day for nonpregnant women and 2 glasses a day for men. A glass of wine equals 5 oz (150 mL). This information  is not intended to replace advice given to you by your health care provider. Make sure you discuss any questions you have with your health care provider. Document Released: 09/04/2015 Document Revised: 10/07/2015 Document Reviewed: 09/04/2015 Elsevier Interactive Patient Education  2018 [employer].   Cough, Adult Coughing is a reflex that clears your throat and your airways. Coughing helps to heal and protect your lungs. It is normal to cough occasionally, but a cough that happens with other symptoms or lasts a long time may be a sign of a condition that needs treatment. A cough may last only 2-3 weeks (acute), or it may last longer than 8 weeks (chronic). What are the causes? Coughing is commonly caused by:  Breathing in substances that irritate your lungs.  A viral or bacterial respiratory infection.  Allergies.  Asthma.  Postnasal drip.  Smoking.  Acid backing up from the stomach into the esophagus (gastroesophageal reflux).  Certain medicines.  Chronic lung problems, including COPD (or rarely, lung cancer).  Other medical conditions such as heart failure.  Follow these instructions at home: Pay attention to any changes in your symptoms. Take these actions to help with your discomfort:  Take medicines only as told by your health care provider. ? If you  were prescribed an antibiotic medicine, take it as told by your health care provider. Do not stop taking the antibiotic even if you start to feel better. ? Talk with your health care provider before you take a cough suppressant medicine.  Drink enough fluid to keep your urine clear or pale yellow.  If the air is dry, use a cold steam vaporizer or humidifier in your bedroom or your home to help loosen secretions.  Avoid anything that causes you to cough at work or at home.  If your cough is worse at night, try sleeping in a semi-upright position.  Avoid cigarette smoke. If you smoke, quit smoking. If you need help  quitting, ask your health care provider.  Avoid caffeine.  Avoid alcohol.  Rest as needed.  Contact a health care provider if:  You have new symptoms.  You cough up pus.  Your cough does not get better after 2-3 weeks, or your cough gets worse.  You cannot control your cough with suppressant medicines and you are losing sleep.  You develop pain that is getting worse or pain that is not controlled with pain medicines.  You have a fever.  You have unexplained weight loss.  You have night sweats. Get help right away if:  You cough up blood.  You have difficulty breathing.  Your heartbeat is very fast. This information is not intended to replace advice given to you by your health care provider. Make sure you discuss any questions you have with your health care provider. Document Released: 07/10/2010 Document Revised: 06/19/2015 Document Reviewed: 03/20/2014 Elsevier Interactive Patient Education  2018 [place] all medications as directed. Continue to drink plenty of fluids, follow Mediterranean diet. To help with cough/wheezing, please use Albuterol inhaler as directed. We will call you when lab results are available. Please schedule physical in 6 months. ENJOY [place]! NICE TO SEE YOU!

## 2017-11-01 NOTE — Assessment & Plan Note (Signed)
Continue Risperidone 1mg  BID Mood stable

## 2017-11-01 NOTE — Assessment & Plan Note (Signed)
BP at goal 134/84, HR 75 Continue Amlodipine 2.56mg  QD, Furosemide 20mg  QD, K Dur 20 meq QD

## 2017-11-02 LAB — HEMOGLOBIN A1C
ESTIMATED AVERAGE GLUCOSE: 111 mg/dL
Hgb A1c MFr Bld: 5.5 % (ref 4.8–5.6)

## 2017-11-02 LAB — CBC WITH DIFFERENTIAL/PLATELET
Basophils Absolute: 0 10*3/uL (ref 0.0–0.2)
Basos: 1 %
EOS (ABSOLUTE): 0.2 10*3/uL (ref 0.0–0.4)
EOS: 3 %
HEMATOCRIT: 38.7 % (ref 34.0–46.6)
HEMOGLOBIN: 13.4 g/dL (ref 11.1–15.9)
IMMATURE GRANULOCYTES: 0 %
Immature Grans (Abs): 0 10*3/uL (ref 0.0–0.1)
Lymphocytes Absolute: 1.9 10*3/uL (ref 0.7–3.1)
Lymphs: 35 %
MCH: 32.4 pg (ref 26.6–33.0)
MCHC: 34.6 g/dL (ref 31.5–35.7)
MCV: 94 fL (ref 79–97)
MONOCYTES: 7 %
Monocytes Absolute: 0.4 10*3/uL (ref 0.1–0.9)
NEUTROS PCT: 54 %
Neutrophils Absolute: 3 10*3/uL (ref 1.4–7.0)
Platelets: 169 10*3/uL (ref 150–450)
RBC: 4.13 x10E6/uL (ref 3.77–5.28)
RDW: 13.4 % (ref 12.3–15.4)
WBC: 5.5 10*3/uL (ref 3.4–10.8)

## 2017-11-02 LAB — COMPREHENSIVE METABOLIC PANEL
ALT: 20 IU/L (ref 0–32)
AST: 22 IU/L (ref 0–40)
Albumin/Globulin Ratio: 1.6 (ref 1.2–2.2)
Albumin: 3.9 g/dL (ref 3.5–4.7)
Alkaline Phosphatase: 80 IU/L (ref 39–117)
BUN/Creatinine Ratio: 13 (ref 12–28)
BUN: 17 mg/dL (ref 8–27)
Bilirubin Total: 0.3 mg/dL (ref 0.0–1.2)
CALCIUM: 9.6 mg/dL (ref 8.7–10.3)
CO2: 23 mmol/L (ref 20–29)
CREATININE: 1.26 mg/dL — AB (ref 0.57–1.00)
Chloride: 104 mmol/L (ref 96–106)
GFR calc Af Amer: 44 mL/min/{1.73_m2} — ABNORMAL LOW (ref >59)
GFR, EST NON AFRICAN AMERICAN: 38 mL/min/{1.73_m2} — AB (ref >59)
GLOBULIN, TOTAL: 2.5 g/dL (ref 1.5–4.5)
Glucose: 101 mg/dL — ABNORMAL HIGH (ref 65–99)
Potassium: 4 mmol/L (ref 3.5–5.2)
Sodium: 142 mmol/L (ref 134–144)
Total Protein: 6.4 g/dL (ref 6.0–8.5)

## 2017-11-02 LAB — LIPID PANEL
Chol/HDL Ratio: 2.1 ratio (ref 0.0–4.4)
Cholesterol, Total: 149 mg/dL (ref 100–199)
HDL: 72 mg/dL (ref >39)
LDL Calculated: 62 mg/dL (ref 0–99)
Triglycerides: 73 mg/dL (ref 0–149)
VLDL Cholesterol Cal: 15 mg/dL (ref 5–40)

## 2017-11-02 LAB — TSH: TSH: 3.68 u[IU]/mL (ref 0.450–4.500)

## 2017-11-16 DIAGNOSIS — Z48817 Encounter for surgical aftercare following surgery on the skin and subcutaneous tissue: Secondary | ICD-10-CM | POA: Diagnosis not present

## 2017-11-16 DIAGNOSIS — Z85828 Personal history of other malignant neoplasm of skin: Secondary | ICD-10-CM | POA: Diagnosis not present

## 2017-11-22 DIAGNOSIS — L249 Irritant contact dermatitis, unspecified cause: Secondary | ICD-10-CM | POA: Diagnosis not present

## 2017-12-07 NOTE — Progress Notes (Signed)
Subjective:    Patient ID: Donna Arias, female    DOB: 08-Aug-1926, 84 y.o.   MRN: [id number]  HPI"  Ms. [name] presents with non-productive cough, wheezing, clear nasal drainage, frontal HA (2/10), and sore throat (2/10) that started >1 week ago. She has been increasing fluids, using ProAir, Nebulizer, and Tessalon. She denies CP/dyspnea at rest/palpitations/N/V/D/bodya aches/night sweats/chills. She denies N/V/D She continues to abstain from tobacco/vape use Daughter is at [facility] during [place]  Patient Care Team    Relationship Specialty Notifications Start End  Esaw Grandchild, NP PCP - General Family Medicine  04/14/16     Patient Active Problem List   Diagnosis Date Noted  . Multiple atypical nevi 06/23/2017  . Ankle edema, bilateral 05/02/2017  . Postnasal drip 12/13/2016  . Acute bronchitis with COPD ([place]) 12/13/2016  . Orthostatic hypotension 11/22/2016  . Hyperlipidemia 11/22/2016  . Anxiety 11/22/2016  . Dysarthria 11/22/2016  . Weight gain 11/18/2016  . Edema of right foot 11/18/2016  . Age-related osteoporosis without current pathological fracture 11/16/2016  . Cough in adult 11/16/2016  . Healthcare maintenance 10/14/2016  . Diaper dermatitis 06/10/2016  . Antibiotic-induced yeast infection 06/10/2016  . Hearing loss secondary to cerumen impaction, bilateral 06/10/2016  . Chronic bilateral low back pain 04/19/2016  . Chronic neck pain 04/19/2016  . Medication refill 04/14/2016  . Joint laxity of right knee 04/14/2016  . Hypothyroidism 02/04/2016  . Vaginitis 07/19/2011  . Upper back pain 08/01/2009  . Transient cerebral ischemia 09/22/2007  . Dementia with behavioral disturbance ([place]) 10/07/2006  . Essential hypertension 10/07/2006  . COPD (chronic obstructive pulmonary disease) ([place]) 10/07/2006  . GERD 10/07/2006     Past Medical History:  Diagnosis Date  . Cancer (HCC)    squamous cell of left face  . COPD (chronic obstructive pulmonary disease) ([place])    . Dementia ([place])   . Dementia ([place])   . GERD (gastroesophageal reflux disease)   . Heart attack ([place]) 1998  . Hypertension   . Stroke ([place])   . Thyroid disease      Past Surgical History:  Procedure Laterality Date  . BREAST LUMPECTOMY  1995   Left Breast  . DILATION AND CURETTAGE OF UTERUS  1975  . GALLBLADDER SURGERY  1993     Family History  Problem Relation Age of Onset  . Hypertension Mother   . Stroke Mother   . Stroke Sister   . Stroke Sister   . Stroke Sister      Social History   Substance and Sexual Activity  Drug Use No     Social History   Substance and Sexual Activity  Alcohol Use No     Social History   Tobacco Use  Smoking Status Former Smoker  Smokeless Tobacco Never Used     Outpatient Encounter Medications as of 12/08/2017  Medication Sig  . albuterol (PROVENTIL HFA;VENTOLIN HFA) 108 (90 Base) MCG/ACT inhaler Inhale 2 puffs into the lungs every 6 (six) hours as needed.  [name] albuterol (PROVENTIL) (2.5 MG/3ML) 0.083% nebulizer solution Take 3 mLs (2.5 mg total) by nebulization every 6 (six) hours as needed for wheezing or shortness of breath.  [name] amLODipine (NORVASC) 2.5 MG tablet Take 1 tablet (2.5 mg total) by mouth daily.  Jearl Klinefelter ELLIPTA 62.5-25 MCG/INH AEPB INHALE 1 PUFF INTO THE LUNGS DAILY.  [name] aspirin 81 MG tablet Take 81 mg by mouth daily.  [name] atorvastatin (LIPITOR) 10 MG tablet TAKE 1 TABLET BY MOUTH DAILY.  [name]  benzonatate (TESSALON) 100 MG capsule Take 1 capsule (100 mg total) by mouth 2 (two) times daily as needed for cough.  . Calcium Carbonate-Vit D-Min (CALCIUM 1200 PO) Take 1 tablet by mouth.  . cyclobenzaprine (FLEXERIL) 5 MG tablet Take 0.5 tablets (2.5 mg total) by mouth at bedtime as needed for muscle spasms.  . fluticasone (FLONASE) 50 MCG/ACT nasal spray Place 1 spray 2 (two) times daily into both nostrils.  . furosemide (LASIX) 20 MG tablet Take 1 tablet (20 mg total) by mouth daily.  . hydrocortisone 2.5 % ointment  Apply 1 application topically 2 (two) times daily.  [name] ketoconazole (NIZORAL) 2 % shampoo APPLY TO AFFECTED AREA AS DIRECTED 2 TIMES A WEEK.  [name] levothyroxine (SYNTHROID, LEVOTHROID) 50 MCG tablet TAKE ONE (1) TABLET BY MOUTH EVERY DAY  . loratadine (CLARITIN) 10 MG tablet Take 10 mg by mouth daily.  . mupirocin ointment (BACTROBAN) 2 % Apply 1 application topically 2 (two) times daily.  . potassium chloride SA (K-DUR,KLOR-CON) 20 MEQ tablet TAKE 1 TABLET BY MOUTH ONCE DAILY  . risperiDONE (RISPERDAL) 1 MG tablet TAKE 1 TABLET BY MOUTH TWICE A DAY.  [name] triamcinolone cream (KENALOG) 0.1 % Apply 1 application topically 2 (two) times daily.  . [DISCONTINUED] albuterol (PROVENTIL) (2.5 MG/3ML) 0.083% nebulizer solution Take 3 mLs (2.5 mg total) by nebulization every 6 (six) hours as needed for wheezing or shortness of breath.  . [DISCONTINUED] benzonatate (TESSALON) 100 MG capsule Take 1 capsule (100 mg total) by mouth 2 (two) times daily as needed for cough.  [name] azithromycin (ZITHROMAX) 250 MG tablet 2 tabs day one.  1 tab days two- five  . Spacer/Aero-Holding Chambers DEVI 1 Product by Does not apply route as needed.   Facility-Administered Encounter Medications as of 12/08/2017  Medication  . ipratropium-albuterol (DUONEB) 0.5-2.5 (3) MG/3ML nebulizer solution 3 mL    Allergies: Codeine sulfate and Picato [ingenol mebutate]  Body mass index is 26.6 kg/m.  Blood pressure 120/71, pulse 67, temperature 97.6 F (36.4 C), temperature source Oral, height 5' 4.5" (1.638 m), weight 157 lb 6.4 oz (71.4 kg), SpO2 98 %.  Review of Systems  Constitutional: Positive for activity change and fatigue. Negative for appetite change, chills, diaphoresis, fever and unexpected weight change.  HENT: Positive for congestion, postnasal drip, sinus pressure and sore throat. Negative for ear discharge, ear pain and voice change.   Eyes: Negative for visual disturbance.  Respiratory: Positive for cough and wheezing.  Negative for shortness of breath.   Cardiovascular: Negative for chest pain, palpitations and leg swelling.  Gastrointestinal: Negative for abdominal distention, abdominal pain, blood in stool, constipation, diarrhea, nausea and vomiting.  Neurological: Positive for headaches. Negative for dizziness.  Hematological: Does not bruise/bleed easily.  Psychiatric/Behavioral: Positive for sleep disturbance.      Objective:   Physical Exam  Constitutional: She is oriented to person, place, and time. She appears well-developed and well-nourished. No distress.  HENT:  Head: Normocephalic and atraumatic.  Right Ear: External ear normal. Tympanic membrane is not erythematous and not bulging. No decreased hearing is noted.  Left Ear: External ear normal. Tympanic membrane is not erythematous and not bulging. No decreased hearing is noted.  Nose: Mucosal edema and rhinorrhea present. Right sinus exhibits no maxillary sinus tenderness and no frontal sinus tenderness. Left sinus exhibits no maxillary sinus tenderness and no frontal sinus tenderness.  Mouth/Throat: Mucous membranes are normal. Abnormal dentition. Posterior oropharyngeal edema and posterior oropharyngeal erythema present. No oropharyngeal exudate or tonsillar abscesses.  Tonsils are 0 on the right. Tonsils are 0 on the left. No tonsillar exudate.  Eyes: Pupils are equal, round, and reactive to light. Conjunctivae and EOM are normal.  Neck: Normal range of motion. Neck supple.  Cardiovascular: Normal rate, regular rhythm, normal heart sounds and intact distal pulses.  No murmur heard. Pulmonary/Chest: Effort normal. No stridor. No respiratory distress. She has wheezes in the right middle field, the right lower field, the left middle field and the left lower field. She has no rhonchi. She has no rales. She exhibits tenderness.  Neb treatment given in clinic 75% reduction in wheezing noted after breathing tx. Pt tolerated well  Lymphadenopathy:     She has no cervical adenopathy.  Neurological: She is alert and oriented to person, place, and time.  Skin: Skin is warm and dry. Capillary refill takes less than 2 seconds. No rash noted. She is not diaphoretic. No erythema. No pallor.  Psychiatric: She has a normal mood and affect. Her behavior is normal. Judgment and thought content normal.  Nursing note and vitals reviewed.     Assessment & Plan:   1. Acute bronchitis with COPD ([place])   2. COPD exacerbation (HCC)     Acute bronchitis with COPD ([place]) Please take Azithromycin as directed. Please use inhaler, nebulizer, Tessalon as needed. Continue to drink plenty of fluids and rest. OTC Acetaminophen for fever/discomfort.  If symptoms persist after antibiotics are completed, please call clinic.    FOLLOW-UP:  Return if symptoms worsen or fail to improve.

## 2017-12-08 ENCOUNTER — Ambulatory Visit (INDEPENDENT_AMBULATORY_CARE_PROVIDER_SITE_OTHER): Payer: Medicare HMO | Admitting: Adult Health

## 2017-12-08 VITALS — BP 120/71 | HR 67 | Temp 97.6°F | Ht 64.5 in | Wt 157.4 lb

## 2017-12-08 DIAGNOSIS — J440 Chronic obstructive pulmonary disease with acute lower respiratory infection: Secondary | ICD-10-CM | POA: Diagnosis not present

## 2017-12-08 DIAGNOSIS — J441 Chronic obstructive pulmonary disease with (acute) exacerbation: Secondary | ICD-10-CM | POA: Diagnosis not present

## 2017-12-08 DIAGNOSIS — J209 Acute bronchitis, unspecified: Secondary | ICD-10-CM

## 2017-12-08 MED ORDER — AZITHROMYCIN 250 MG PO TABS: ORAL_TABLET | ORAL | 0 refills | Status: DC

## 2017-12-08 MED ORDER — BENZONATATE 100 MG PO CAPS: 100.0000 mg | ORAL_CAPSULE | Freq: Two times a day (BID) | ORAL | 0 refills | Status: DC | PRN

## 2017-12-08 MED ORDER — SPACER/AERO-HOLDING CHAMBERS DEVI: 1.0000 | 1 refills | Status: DC | PRN

## 2017-12-08 MED ORDER — IPRATROPIUM-ALBUTEROL 0.5-2.5 (3) MG/3ML IN SOLN
3.0000 mL | Freq: Four times a day (QID) | RESPIRATORY_TRACT | Status: DC
  Administered 2017-12-08: 3 mL via RESPIRATORY_TRACT

## 2017-12-08 MED ORDER — ALBUTEROL SULFATE (2.5 MG/3ML) 0.083% IN NEBU: 2.5000 mg | INHALATION_SOLUTION | Freq: Four times a day (QID) | RESPIRATORY_TRACT | 12 refills | Status: DC | PRN

## 2017-12-08 NOTE — Patient Instructions (Signed)

## 2017-12-08 NOTE — Assessment & Plan Note (Signed)
Please take Azithromycin as directed. Please use inhaler, nebulizer, Tessalon as needed. Continue to drink plenty of fluids and rest. OTC Acetaminophen for fever/discomfort.  If symptoms persist after antibiotics are completed, please call clinic.

## 2017-12-28 DIAGNOSIS — Z85828 Personal history of other malignant neoplasm of skin: Secondary | ICD-10-CM | POA: Diagnosis not present

## 2017-12-28 DIAGNOSIS — L905 Scar conditions and fibrosis of skin: Secondary | ICD-10-CM | POA: Diagnosis not present

## 2018-01-02 DIAGNOSIS — H00025 Hordeolum internum left lower eyelid: Secondary | ICD-10-CM | POA: Diagnosis not present

## 2018-01-16 ENCOUNTER — Ambulatory Visit (INDEPENDENT_AMBULATORY_CARE_PROVIDER_SITE_OTHER): Payer: Medicare HMO | Admitting: Adult Health

## 2018-01-16 ENCOUNTER — Encounter: Payer: Self-pay | Admitting: Adult Health

## 2018-01-16 VITALS — BP 130/77 | HR 81 | Temp 98.9°F | Ht 64.5 in | Wt 156.8 lb

## 2018-01-16 DIAGNOSIS — H44002 Unspecified purulent endophthalmitis, left eye: Secondary | ICD-10-CM | POA: Insufficient documentation

## 2018-01-16 DIAGNOSIS — I10 Essential (primary) hypertension: Secondary | ICD-10-CM

## 2018-01-16 DIAGNOSIS — H938X2 Other specified disorders of left ear: Secondary | ICD-10-CM | POA: Diagnosis not present

## 2018-01-16 HISTORY — DX: Unspecified purulent endophthalmitis, left eye: H44.002

## 2018-01-16 MED ORDER — KETOCONAZOLE 2 % EX SHAM: MEDICATED_SHAMPOO | CUTANEOUS | 1 refills | Status: DC

## 2018-01-16 NOTE — Assessment & Plan Note (Signed)
Your ears have been flushed, no sign of infection of active sinusitis (you just completed course of cephalexin). Follow-up as needed.

## 2018-01-16 NOTE — Patient Instructions (Signed)
Bacterial Conjunctivitis, Adult Bacterial conjunctivitis is an infection of the clear membrane that covers the white part of your eye and the inner surface of your eyelid (conjunctiva). When the blood vessels in your conjunctiva become inflamed, your eye becomes red or pink, and it will probably feel itchy. Bacterial conjunctivitis spreads very easily from person to person (is contagious). It also spreads easily from one eye to the other eye. What are the causes? This condition is caused by bacteria. You may get the infection if you come into close contact with:  A person who is infected with the bacteria.  Items that are contaminated with the bacteria, such as a face towel, contact lens solution, or eye makeup. What increases the risk? You are more likely to develop this condition if you:  Are exposed to other people who have the infection.  Wear contact lenses.  Have a sinus infection.  Have had a recent eye injury or surgery.  Have a weak body defense system (immune system).  Have a medical condition that causes dry eyes. What are the signs or symptoms? Symptoms of this condition include:  Thick, yellowish discharge from the eye. This may turn into a crust on the eyelid overnight and cause your eyelids to stick together.  Tearing or watery eyes.  Itchy eyes.  Burning feeling in your eyes.  Eye redness.  Swollen eyelids.  Blurred vision. How is this diagnosed? This condition is diagnosed based on your symptoms and medical history. Your health care provider may also take a sample of discharge from your eye to find the cause of your infection. This is rarely done. How is this treated? This condition may be treated with:  Antibiotic eye drops or ointment to clear the infection more quickly and prevent the spread of infection to others.  Oral antibiotic medicines to treat infections that do not respond to drops or ointments or that last longer than 10 days.  Cool, wet  cloths (cool compresses) placed on the eyes.  Artificial tears applied 2-6 times a day. Follow these instructions at home: Medicines  Take or apply your antibiotic medicine as told by your health care provider. Do not stop taking or applying the antibiotic even if you start to feel better.  Take or apply over-the-counter and prescription medicines only as told by your health care provider.  Be very careful to avoid touching the edge of your eyelid with the eye-drop bottle or the ointment tube when you apply medicines to the affected eye. This will keep you from spreading the infection to your other eye or to other people. Managing discomfort  Gently wipe away any drainage from your eye with a warm, wet washcloth or a cotton ball.  Apply a clean, cool compress to your eye for 10-20 minutes, 3-4 times a day. General instructions  Do not wear contact lenses until the inflammation is gone and your health care provider says it is safe to wear them again. Ask your health care provider how to sterilize or replace your contact lenses before you use them again. Wear glasses until you can resume wearing contact lenses.  Avoid wearing eye makeup until the inflammation is gone. Throw away any old eye cosmetics that may be contaminated.  Change or wash your pillowcase every day.  Do not share towels or washcloths. This may spread the infection.  Wash your hands often with soap and water. Use paper towels to dry your hands.  Avoid touching or rubbing your eyes.  Do not   drive or use heavy machinery if your vision is blurred. Contact a health care provider if:  You have a fever.  Your symptoms do not get better after 10 days. Get help right away if you have:  A fever and your symptoms suddenly get worse.  Severe pain when you move your eye.  Facial pain, redness, or swelling.  Sudden loss of vision. Summary  Bacterial conjunctivitis is an infection of the clear membrane that covers the  white part of your eye and the inner surface of your eyelid (conjunctiva).  Bacterial conjunctivitis spreads very easily from person to person (is contagious).  Wash your hands often with soap and water. Use paper towels to dry your hands.  Take or apply your antibiotic medicine as told by your health care provider. Do not stop taking or applying the antibiotic even if you start to feel better.  Contact a health care provider if you have a fever or your symptoms do not get better after 10 days. This information is not intended to replace advice given to you by your health care provider. Make sure you discuss any questions you have with your health care provider. Document Released: 01/11/2005 Document Revised: 08/17/2017 Document Reviewed: 08/17/2017 Elsevier Interactive Patient Education  2019 [employer].  Please finish course of eye drops as directed by your Optometrist, then have complete eye examination in the next few weeks since you are experiencing a decline in vision. Your ears have been flushed, no sign of infection of active sinusitis (you just completed course of cephalexin). Continue to drink plenty of water and eat a heart healthy diet. Follow-up as needed. HAPPY [place]! NICE TO SEE YOU!

## 2018-01-16 NOTE — Assessment & Plan Note (Signed)
BP at goal 130/77, HR 81

## 2018-01-16 NOTE — Assessment & Plan Note (Signed)
Please finish course of eye drops as directed by your Optometrist, then have complete eye examination in the next few weeks since you are experiencing a decline in vision. Your ears have been flushed, no sign of infection of active sinusitis (you just completed course of cephalexin). Continue to drink plenty of water and eat a heart healthy diet. Follow-up as needed.

## 2018-01-16 NOTE — Progress Notes (Signed)
Subjective:    Patient ID: Donna Arias, female    DOB: [birth year], 84 y.o.   MRN: [id number]  HPI:  [name] presents with L ear fullness and resolving OS infection. She also reports sig change in vision in OS.  She was seen by her Optometrist 2 weeks ago and treated with low dose steroid dose pack, Cephalexin 500mg  BID x 10 days, Prednisolone eye gtt's. She denies fever/night sweats/chills/N/V/D She reports slight nasal congestion and very mild, intermittent non-productive cough the last few weeks, however feels that they have both improved since completing the 10 day course of ABX She did not have dilated eye examination when seen at the Optometry Daughter at [facility] during [place]  Patient Care Team    Relationship Specialty Notifications Start End  Esaw Grandchild, NP PCP - General Family Medicine  04/14/16     Patient Active Problem List   Diagnosis Date Noted  . Eye infection, left 01/16/2018  . Ear fullness, left 01/16/2018  . Multiple atypical nevi 06/23/2017  . Ankle edema, bilateral 05/02/2017  . Postnasal drip 12/13/2016  . Acute bronchitis with COPD ([place]) 12/13/2016  . Orthostatic hypotension 11/22/2016  . Hyperlipidemia 11/22/2016  . Anxiety 11/22/2016  . Dysarthria 11/22/2016  . Weight gain 11/18/2016  . Edema of right foot 11/18/2016  . Age-related osteoporosis without current pathological fracture 11/16/2016  . Cough in adult 11/16/2016  . Healthcare maintenance 10/14/2016  . Diaper dermatitis 06/10/2016  . Antibiotic-induced yeast infection 06/10/2016  . Hearing loss secondary to cerumen impaction, bilateral 06/10/2016  . Chronic bilateral low back pain 04/19/2016  . Chronic neck pain 04/19/2016  . Medication refill 04/14/2016  . Joint laxity of right knee 04/14/2016  . Hypothyroidism 02/04/2016  . Upper back pain 08/01/2009  . Transient cerebral ischemia 09/22/2007  . Dementia with behavioral disturbance ([place]) 10/07/2006  . Essential hypertension 10/07/2006   . COPD (chronic obstructive pulmonary disease) ([place]) 10/07/2006  . GERD 10/07/2006     Past Medical History:  Diagnosis Date  . Cancer (HCC)    squamous cell of left face  . COPD (chronic obstructive pulmonary disease) ([place])   . Dementia ([place])   . Dementia ([place])   . GERD (gastroesophageal reflux disease)   . Heart attack ([place]) 1998  . Hypertension   . Stroke ([place])   . Thyroid disease      Past Surgical History:  Procedure Laterality Date  . BREAST LUMPECTOMY  1995   Left Breast  . DILATION AND CURETTAGE OF UTERUS  1975  . GALLBLADDER SURGERY  1993     Family History  Problem Relation Age of Onset  . Hypertension Mother   . Stroke Mother   . Stroke Sister   . Stroke Sister   . Stroke Sister      Social History   Substance and Sexual Activity  Drug Use No     Social History   Substance and Sexual Activity  Alcohol Use No     Social History   Tobacco Use  Smoking Status Former Smoker  Smokeless Tobacco Never Used     Outpatient Encounter Medications as of 01/16/2018  Medication Sig  . albuterol (PROVENTIL HFA;VENTOLIN HFA) 108 (90 Base) MCG/ACT inhaler Inhale 2 puffs into the lungs every 6 (six) hours as needed.  [name] albuterol (PROVENTIL) (2.5 MG/3ML) 0.083% nebulizer solution Take 3 mLs (2.5 mg total) by nebulization every 6 (six) hours as needed for wheezing or shortness of breath.  [name] amLODipine (NORVASC) 2.5  MG tablet Take 1 tablet (2.5 mg total) by mouth daily.  Jearl Klinefelter ELLIPTA 62.5-25 MCG/INH AEPB INHALE 1 PUFF INTO THE LUNGS DAILY.  [name] aspirin 81 MG tablet Take 81 mg by mouth daily.  [name] atorvastatin (LIPITOR) 10 MG tablet TAKE 1 TABLET BY MOUTH DAILY.  . benzonatate (TESSALON) 100 MG capsule Take 1 capsule (100 mg total) by mouth 2 (two) times daily as needed for cough.  . Calcium Carbonate-Vit D-Min (CALCIUM 1200 PO) Take 1 tablet by mouth.  . cyclobenzaprine (FLEXERIL) 5 MG tablet Take 0.5 tablets (2.5 mg total) by mouth at bedtime as needed  for muscle spasms.  . fluticasone (FLONASE) 50 MCG/ACT nasal spray Place 1 spray 2 (two) times daily into both nostrils.  . furosemide (LASIX) 20 MG tablet Take 1 tablet (20 mg total) by mouth daily.  [name] ketoconazole (NIZORAL) 2 % shampoo APPLY TO AFFECTED AREA AS DIRECTED 2 TIMES A WEEK.  [name] levothyroxine (SYNTHROID, LEVOTHROID) 50 MCG tablet TAKE ONE (1) TABLET BY MOUTH EVERY DAY  . loratadine (CLARITIN) 10 MG tablet Take 10 mg by mouth daily.  . potassium chloride SA (K-DUR,KLOR-CON) 20 MEQ tablet TAKE 1 TABLET BY MOUTH ONCE DAILY  . prednisoLONE acetate (PRED FORTE) 1 % ophthalmic suspension Place 1 drop into both eyes 2 (two) times daily.  . risperiDONE (RISPERDAL) 1 MG tablet TAKE 1 TABLET BY MOUTH TWICE A DAY.  [name] Spacer/Aero-Holding Chambers DEVI 1 Product by Does not apply route as needed.  . triamcinolone cream (KENALOG) 0.1 % Apply 1 application topically 2 (two) times daily.  . [DISCONTINUED] ketoconazole (NIZORAL) 2 % shampoo APPLY TO AFFECTED AREA AS DIRECTED 2 TIMES A WEEK.  . [DISCONTINUED] azithromycin (ZITHROMAX) 250 MG tablet 2 tabs day one.  1 tab days two- five  . [DISCONTINUED] hydrocortisone 2.5 % ointment Apply 1 application topically 2 (two) times daily.  . [DISCONTINUED] mupirocin ointment (BACTROBAN) 2 % Apply 1 application topically 2 (two) times daily.   Facility-Administered Encounter Medications as of 01/16/2018  Medication  . ipratropium-albuterol (DUONEB) 0.5-2.5 (3) MG/3ML nebulizer solution 3 mL    Allergies: Codeine sulfate and Picato [ingenol mebutate]  Body mass index is 26.5 kg/m.  Blood pressure 130/77, pulse 81, temperature 98.9 F (37.2 C), temperature source Oral, height 5' 4.5" (1.638 m), weight 156 lb 12.8 oz (71.1 kg), SpO2 97 %.  Review of Systems  Constitutional: Positive for fatigue. Negative for activity change, appetite change, chills, diaphoresis, fever and unexpected weight change.  HENT: Positive for congestion, hearing loss and  rhinorrhea. Negative for ear discharge, ear pain, facial swelling, postnasal drip, sinus pressure, sinus pain and sneezing.   Eyes: Positive for redness and visual disturbance. Negative for photophobia, pain, discharge and itching.  Respiratory: Positive for cough. Negative for chest tightness, shortness of breath, wheezing and stridor.   Cardiovascular: Negative for chest pain, palpitations and leg swelling.  Endocrine: Negative for cold intolerance, heat intolerance, polydipsia, polyphagia and polyuria.  Neurological: Negative for dizziness and headaches.  Hematological: Does not bruise/bleed easily.  Psychiatric/Behavioral: Negative for agitation, behavioral problems, confusion, decreased concentration, dysphoric mood, hallucinations, self-injury, sleep disturbance and suicidal ideas. The patient is not nervous/anxious and is not hyperactive.        Objective:   Physical Exam Vitals signs and nursing note reviewed.  Constitutional:      Appearance: Normal appearance.  HENT:     Head: Normocephalic and atraumatic.     Right Ear: Tympanic membrane normal. Decreased hearing noted. There is impacted cerumen.  Tympanic membrane is not erythematous or bulging.     Left Ear: Decreased hearing noted. There is impacted cerumen. Tympanic membrane is not erythematous or bulging.     Ears:     Comments: Ears flushed, sig amount of cerumen removed    Nose: Rhinorrhea present. No mucosal edema or congestion.     Right Turbinates: Not swollen.     Left Turbinates: Not swollen.     Right Sinus: No maxillary sinus tenderness or frontal sinus tenderness.     Left Sinus: No maxillary sinus tenderness or frontal sinus tenderness.     Mouth/Throat:     Dentition: Has dentures.     Pharynx: No oropharyngeal exudate, posterior oropharyngeal erythema or uvula swelling.     Tonsils: No tonsillar exudate or tonsillar abscesses. Swelling: 0 on the right. 0 on the left.  Eyes:     General:        Right eye:  No discharge.        Left eye: No discharge.     Extraocular Movements:     Right eye: Normal extraocular motion.     Left eye: Normal extraocular motion.     Conjunctiva/sclera:     Right eye: Right conjunctiva is not injected.     Left eye: Left conjunctiva is not injected.  Cardiovascular:     Rate and Rhythm: Normal rate and regular rhythm.     Pulses: Normal pulses.     Heart sounds: Normal heart sounds.  Pulmonary:     Effort: Pulmonary effort is normal. No respiratory distress.     Breath sounds: Normal breath sounds. No stridor. No wheezing, rhonchi or rales.  Chest:     Chest wall: No tenderness.  Skin:    Capillary Refill: Capillary refill takes less than 2 seconds.  Neurological:     Mental Status: She is alert and oriented to person, place, and time.  Psychiatric:        Attention and Perception: Attention and perception normal.        Mood and Affect: Mood normal.        Behavior: Behavior normal.        Thought Content: Thought content normal.        Cognition and Memory: Cognition normal.        Judgment: Judgment normal.     Comments: Garbled speech-her baseline       Assessment & Plan:   1. Eye infection, left   2. Ear fullness, left   3. Essential hypertension     Eye infection, left Please finish course of eye drops as directed by your Optometrist, then have complete eye examination in the next few weeks since you are experiencing a decline in vision. Your ears have been flushed, no sign of infection of active sinusitis (you just completed course of cephalexin). Continue to drink plenty of water and eat a heart healthy diet. Follow-up as needed.  Ear fullness, left Your ears have been flushed, no sign of infection of active sinusitis (you just completed course of cephalexin). Follow-up as needed.  Essential hypertension BP at goal 130/77, HR 81     FOLLOW-UP:  Return if symptoms worsen or fail to improve.

## 2018-01-23 ENCOUNTER — Other Ambulatory Visit: Payer: Self-pay | Admitting: Adult Health

## 2018-01-30 DIAGNOSIS — H00025 Hordeolum internum left lower eyelid: Secondary | ICD-10-CM | POA: Diagnosis not present

## 2018-02-02 DIAGNOSIS — H4312 Vitreous hemorrhage, left eye: Secondary | ICD-10-CM | POA: Diagnosis not present

## 2018-02-02 DIAGNOSIS — H35012 Changes in retinal vascular appearance, left eye: Secondary | ICD-10-CM | POA: Diagnosis not present

## 2018-02-02 DIAGNOSIS — H3562 Retinal hemorrhage, left eye: Secondary | ICD-10-CM | POA: Diagnosis not present

## 2018-02-02 DIAGNOSIS — H35361 Drusen (degenerative) of macula, right eye: Secondary | ICD-10-CM | POA: Diagnosis not present

## 2018-02-08 ENCOUNTER — Other Ambulatory Visit: Payer: Self-pay | Admitting: Adult Health

## 2018-02-08 DIAGNOSIS — Z09 Encounter for follow-up examination after completed treatment for conditions other than malignant neoplasm: Secondary | ICD-10-CM

## 2018-02-13 ENCOUNTER — Other Ambulatory Visit: Payer: Self-pay | Admitting: Adult Health

## 2018-02-13 ENCOUNTER — Other Ambulatory Visit (INDEPENDENT_AMBULATORY_CARE_PROVIDER_SITE_OTHER): Payer: Medicare HMO

## 2018-02-13 DIAGNOSIS — N189 Chronic kidney disease, unspecified: Secondary | ICD-10-CM | POA: Diagnosis not present

## 2018-02-14 LAB — COMPREHENSIVE METABOLIC PANEL
ALT: 17 IU/L (ref 0–32)
AST: 25 IU/L (ref 0–40)
Albumin/Globulin Ratio: 1.7 (ref 1.2–2.2)
Albumin: 4 g/dL (ref 3.6–4.6)
Alkaline Phosphatase: 88 IU/L (ref 39–117)
BUN/Creatinine Ratio: 20 (ref 12–28)
BUN: 23 mg/dL (ref 8–27)
Bilirubin Total: 0.3 mg/dL (ref 0.0–1.2)
CALCIUM: 10 mg/dL (ref 8.7–10.3)
CHLORIDE: 106 mmol/L (ref 96–106)
CO2: 21 mmol/L (ref 20–29)
Creatinine, Ser: 1.16 mg/dL — ABNORMAL HIGH (ref 0.57–1.00)
GFR, EST AFRICAN AMERICAN: 48 mL/min/{1.73_m2} — AB (ref >59)
GFR, EST NON AFRICAN AMERICAN: 42 mL/min/{1.73_m2} — AB (ref >59)
Globulin, Total: 2.4 g/dL (ref 1.5–4.5)
Glucose: 101 mg/dL — ABNORMAL HIGH (ref 65–99)
Potassium: 3.9 mmol/L (ref 3.5–5.2)
Sodium: 145 mmol/L — ABNORMAL HIGH (ref 134–144)
TOTAL PROTEIN: 6.4 g/dL (ref 6.0–8.5)

## 2018-02-15 DIAGNOSIS — H3562 Retinal hemorrhage, left eye: Secondary | ICD-10-CM | POA: Diagnosis not present

## 2018-02-15 DIAGNOSIS — H4312 Vitreous hemorrhage, left eye: Secondary | ICD-10-CM | POA: Diagnosis not present

## 2018-02-15 DIAGNOSIS — H35012 Changes in retinal vascular appearance, left eye: Secondary | ICD-10-CM | POA: Diagnosis not present

## 2018-02-22 DIAGNOSIS — H35372 Puckering of macula, left eye: Secondary | ICD-10-CM | POA: Diagnosis not present

## 2018-02-22 DIAGNOSIS — H3562 Retinal hemorrhage, left eye: Secondary | ICD-10-CM | POA: Diagnosis not present

## 2018-02-22 DIAGNOSIS — H4312 Vitreous hemorrhage, left eye: Secondary | ICD-10-CM | POA: Diagnosis not present

## 2018-03-02 DIAGNOSIS — H3562 Retinal hemorrhage, left eye: Secondary | ICD-10-CM | POA: Diagnosis not present

## 2018-03-02 DIAGNOSIS — H35012 Changes in retinal vascular appearance, left eye: Secondary | ICD-10-CM | POA: Diagnosis not present

## 2018-03-02 DIAGNOSIS — Z09 Encounter for follow-up examination after completed treatment for conditions other than malignant neoplasm: Secondary | ICD-10-CM | POA: Diagnosis not present

## 2018-03-09 ENCOUNTER — Other Ambulatory Visit: Payer: Self-pay | Admitting: Adult Health

## 2018-03-10 ENCOUNTER — Other Ambulatory Visit: Payer: Self-pay | Admitting: Adult Health

## 2018-03-13 ENCOUNTER — Telehealth: Payer: Self-pay | Admitting: Adult Health

## 2018-03-13 NOTE — Telephone Encounter (Signed)
Please review refill request and authorize if appropriate.  This medication was previously DC'd.  Charyl Bigger, CMA

## 2018-03-13 NOTE — Telephone Encounter (Signed)
Good Morning Donna Arias, Can you please call Donna Arias and inquire- How is feeling, what sx's she is experiencing Any thoughts of harming herself/others Why she would like to re-start SSRI?  Any recent life changes, etc? Thanks! Valetta Fuller

## 2018-03-13 NOTE — Telephone Encounter (Signed)
Pt's daughter states that she has not DC'd this medication.  She has been taking it for 5 years and definitely needs to continue it.  Daughter states that the pt is doing well.  Charyl Bigger, CMA

## 2018-03-13 NOTE — Telephone Encounter (Signed)
See additional documentation.  Charyl Bigger, CMA

## 2018-03-13 NOTE — Telephone Encounter (Signed)
Pt's daughter called to refill on : FLUoxetine (PROZAC) 10 MG capsule [[id number]] DISCONTINUED   Order Details  Dose: 10 mg Route: Oral Frequency: Daily  Dispense Quantity: 90 capsule Refills: 2 Fills remaining: --        Sig: Take 1 capsule (10 mg total) by mouth daily.     ---Forwarding message to medical assistant that if approved please send to :   [place], [place] - [place] [phone] (Phone) [phone] (Fax)   --glh

## 2018-03-20 ENCOUNTER — Other Ambulatory Visit: Payer: Self-pay | Admitting: Adult Health

## 2018-03-20 DIAGNOSIS — Z760 Encounter for issue of repeat prescription: Secondary | ICD-10-CM

## 2018-03-27 DIAGNOSIS — H353221 Exudative age-related macular degeneration, left eye, with active choroidal neovascularization: Secondary | ICD-10-CM | POA: Diagnosis not present

## 2018-03-29 ENCOUNTER — Other Ambulatory Visit: Payer: Self-pay | Admitting: Adult Health

## 2018-03-29 DIAGNOSIS — Z760 Encounter for issue of repeat prescription: Secondary | ICD-10-CM

## 2018-04-25 ENCOUNTER — Other Ambulatory Visit: Payer: Self-pay | Admitting: Adult Health

## 2018-05-01 ENCOUNTER — Ambulatory Visit: Payer: Medicare HMO | Admitting: Adult Health

## 2018-05-04 DIAGNOSIS — H35012 Changes in retinal vascular appearance, left eye: Secondary | ICD-10-CM | POA: Diagnosis not present

## 2018-05-04 DIAGNOSIS — H3562 Retinal hemorrhage, left eye: Secondary | ICD-10-CM | POA: Diagnosis not present

## 2018-05-09 ENCOUNTER — Other Ambulatory Visit: Payer: Self-pay | Admitting: Adult Health

## 2018-05-09 DIAGNOSIS — Z760 Encounter for issue of repeat prescription: Secondary | ICD-10-CM

## 2018-06-12 ENCOUNTER — Other Ambulatory Visit: Payer: Self-pay | Admitting: Adult Health

## 2018-06-12 DIAGNOSIS — Z09 Encounter for follow-up examination after completed treatment for conditions other than malignant neoplasm: Secondary | ICD-10-CM

## 2018-06-20 ENCOUNTER — Other Ambulatory Visit: Payer: Self-pay | Admitting: Adult Health

## 2018-06-20 DIAGNOSIS — E039 Hypothyroidism, unspecified: Secondary | ICD-10-CM

## 2018-06-27 ENCOUNTER — Other Ambulatory Visit: Payer: Self-pay | Admitting: Adult Health

## 2018-06-29 DIAGNOSIS — H35012 Changes in retinal vascular appearance, left eye: Secondary | ICD-10-CM | POA: Diagnosis not present

## 2018-06-29 DIAGNOSIS — H3562 Retinal hemorrhage, left eye: Secondary | ICD-10-CM | POA: Diagnosis not present

## 2018-06-29 DIAGNOSIS — H353221 Exudative age-related macular degeneration, left eye, with active choroidal neovascularization: Secondary | ICD-10-CM | POA: Diagnosis not present

## 2018-06-29 DIAGNOSIS — H353111 Nonexudative age-related macular degeneration, right eye, early dry stage: Secondary | ICD-10-CM | POA: Diagnosis not present

## 2018-07-05 ENCOUNTER — Other Ambulatory Visit: Payer: Self-pay | Admitting: Adult Health

## 2018-07-05 DIAGNOSIS — Z760 Encounter for issue of repeat prescription: Secondary | ICD-10-CM

## 2018-07-05 NOTE — Telephone Encounter (Signed)
Pt was to have returned 04/2018 for CPE.  Given current COVID situation and pt's age/risk score, does pt need OV prior to refill, telemedicine visit, or refill medication at this time?  Please review and authorize refill if appropriate.  Charyl Bigger, CMA

## 2018-07-25 ENCOUNTER — Other Ambulatory Visit: Payer: Self-pay | Admitting: Adult Health

## 2018-08-10 ENCOUNTER — Other Ambulatory Visit: Payer: Self-pay | Admitting: Adult Health

## 2018-08-10 DIAGNOSIS — Z09 Encounter for follow-up examination after completed treatment for conditions other than malignant neoplasm: Secondary | ICD-10-CM

## 2018-09-19 ENCOUNTER — Telehealth: Payer: Self-pay

## 2018-09-19 ENCOUNTER — Other Ambulatory Visit: Payer: Self-pay | Admitting: Adult Health

## 2018-09-19 DIAGNOSIS — Z760 Encounter for issue of repeat prescription: Secondary | ICD-10-CM

## 2018-09-19 NOTE — Telephone Encounter (Signed)
Pt needs OV prior to any further refills. Please call to schedule OV.  Charyl Bigger, CMA

## 2018-10-04 ENCOUNTER — Other Ambulatory Visit: Payer: Self-pay | Admitting: Adult Health

## 2018-10-04 DIAGNOSIS — Z760 Encounter for issue of repeat prescription: Secondary | ICD-10-CM

## 2018-10-12 ENCOUNTER — Encounter: Payer: Self-pay | Admitting: Adult Health

## 2018-10-12 ENCOUNTER — Other Ambulatory Visit: Payer: Self-pay

## 2018-10-12 ENCOUNTER — Ambulatory Visit (INDEPENDENT_AMBULATORY_CARE_PROVIDER_SITE_OTHER): Payer: Medicare HMO | Admitting: Adult Health

## 2018-10-12 VITALS — BP 152/75 | HR 58 | Temp 97.6°F | Ht 64.5 in | Wt 158.6 lb

## 2018-10-12 DIAGNOSIS — J441 Chronic obstructive pulmonary disease with (acute) exacerbation: Secondary | ICD-10-CM

## 2018-10-12 DIAGNOSIS — G629 Polyneuropathy, unspecified: Secondary | ICD-10-CM

## 2018-10-12 DIAGNOSIS — I10 Essential (primary) hypertension: Secondary | ICD-10-CM

## 2018-10-12 DIAGNOSIS — Z760 Encounter for issue of repeat prescription: Secondary | ICD-10-CM

## 2018-10-12 DIAGNOSIS — E039 Hypothyroidism, unspecified: Secondary | ICD-10-CM | POA: Diagnosis not present

## 2018-10-12 DIAGNOSIS — Z0000 Encounter for general adult medical examination without abnormal findings: Secondary | ICD-10-CM

## 2018-10-12 DIAGNOSIS — J209 Acute bronchitis, unspecified: Secondary | ICD-10-CM

## 2018-10-12 MED ORDER — LORATADINE 10 MG PO TABS: 10.0000 mg | ORAL_TABLET | Freq: Every day | ORAL | 1 refills | Status: DC

## 2018-10-12 MED ORDER — FLUTICASONE PROPIONATE 50 MCG/ACT NA SUSP: 1.0000 | Freq: Two times a day (BID) | NASAL | 6 refills | Status: AC

## 2018-10-12 MED ORDER — LEVOTHYROXINE SODIUM 50 MCG PO TABS: ORAL_TABLET | ORAL | 1 refills | Status: DC

## 2018-10-12 MED ORDER — FLUOXETINE HCL 10 MG PO CAPS: 10.0000 mg | ORAL_CAPSULE | Freq: Every day | ORAL | 2 refills | Status: DC

## 2018-10-12 MED ORDER — BENZONATATE 100 MG PO CAPS: 100.0000 mg | ORAL_CAPSULE | Freq: Two times a day (BID) | ORAL | 0 refills | Status: DC | PRN

## 2018-10-12 MED ORDER — ALBUTEROL SULFATE (2.5 MG/3ML) 0.083% IN NEBU: 2.5000 mg | INHALATION_SOLUTION | Freq: Four times a day (QID) | RESPIRATORY_TRACT | 12 refills | Status: DC | PRN

## 2018-10-12 MED ORDER — POTASSIUM CHLORIDE CRYS ER 20 MEQ PO TBCR: EXTENDED_RELEASE_TABLET | ORAL | 0 refills | Status: DC

## 2018-10-12 MED ORDER — RISPERIDONE 1 MG PO TABS: 1.0000 mg | ORAL_TABLET | Freq: Two times a day (BID) | ORAL | 1 refills | Status: DC

## 2018-10-12 MED ORDER — AMLODIPINE BESYLATE 2.5 MG PO TABS: ORAL_TABLET | ORAL | 0 refills | Status: DC

## 2018-10-12 MED ORDER — FUROSEMIDE 20 MG PO TABS: 20.0000 mg | ORAL_TABLET | Freq: Every day | ORAL | 1 refills | Status: DC

## 2018-10-12 MED ORDER — BACLOFEN 5 MG PO TABS: 1.0000 | ORAL_TABLET | Freq: Every evening | ORAL | 0 refills | Status: DC | PRN

## 2018-10-12 MED ORDER — TRIAMCINOLONE ACETONIDE 0.1 % EX CREA: 1.0000 "application " | TOPICAL_CREAM | Freq: Two times a day (BID) | CUTANEOUS | 0 refills | Status: DC

## 2018-10-12 MED ORDER — ATORVASTATIN CALCIUM 10 MG PO TABS: 10.0000 mg | ORAL_TABLET | Freq: Every day | ORAL | 0 refills | Status: DC

## 2018-10-12 NOTE — Progress Notes (Signed)
Virtual Visit via Telephone Note  I connected with Donna Arias on 10/12/18 at 10:30 AM EDT by telephone and verified that I am speaking with the correct person using two identifiers.  Location: Patient: Home Provider: In Clinic    I discussed the limitations, risks, security and privacy concerns of performing an evaluation and management service by telephone and the availability of in person appointments. I also discussed with the patient that there may be a patient responsible charge related to this service. The patient expressed understanding and agreed to proceed.   History of Present Illness: 10/819 OV: Donna Arias is here for 6 month f/u: HTN,HLD, Hypothyroidism, lower ext edema. She reports medication compliance and only has one acute complaint- Non-productive cough the last three days She denies CP/palpitations/fever/night sweats/chills She reports reduction in lower extremity edema and has been wearing compression stockings most days of the week She reports strong appetite and excellent sleep and denies any recent falls Daughter at [facility] during [place]  10/12/2018 OV: Donna Arias calls in today for regular f/u:   HTN,HLD, Hypothyroidism, lower ext edema, Hypothyroidism, Season allergies Daughter, "Donna Arias", did most the speaking during telephone  She drinks 32 ounces/day- combination of water, milk, sips of sweat tea Ambulatory BP SBP 140-150 DBP 70s She denies CP/chest tightness with exertion She denies any recent falls She has strong appetite She continues to experience seasonal allergies- no wheezing at present   Patient Care Team    Relationship Specialty Notifications Start End  Esaw Grandchild, NP PCP - General Family Medicine  04/14/16     Patient Active Problem List   Diagnosis Date Noted  . Eye infection, left 01/16/2018  . Ear fullness, left 01/16/2018  . Multiple atypical nevi 06/23/2017  . Ankle edema, bilateral 05/02/2017  . Postnasal drip 12/13/2016  . Acute  bronchitis with COPD ([place]) 12/13/2016  . Orthostatic hypotension 11/22/2016  . Hyperlipidemia 11/22/2016  . Anxiety 11/22/2016  . Dysarthria 11/22/2016  . Weight gain 11/18/2016  . Edema of right foot 11/18/2016  . Age-related osteoporosis without current pathological fracture 11/16/2016  . Cough in adult 11/16/2016  . Healthcare maintenance 10/14/2016  . Diaper dermatitis 06/10/2016  . Antibiotic-induced yeast infection 06/10/2016  . Hearing loss secondary to cerumen impaction, bilateral 06/10/2016  . Chronic bilateral low back pain 04/19/2016  . Chronic neck pain 04/19/2016  . Medication refill 04/14/2016  . Joint laxity of right knee 04/14/2016  . Hypothyroidism 02/04/2016  . Upper back pain 08/01/2009  . Transient cerebral ischemia 09/22/2007  . Dementia with behavioral disturbance ([place]) 10/07/2006  . Essential hypertension 10/07/2006  . COPD (chronic obstructive pulmonary disease) ([place]) 10/07/2006  . GERD 10/07/2006     Past Medical History:  Diagnosis Date  . Cancer (HCC)    squamous cell of left face  . COPD (chronic obstructive pulmonary disease) ([place])   . Dementia ([place])   . Dementia ([place])   . GERD (gastroesophageal reflux disease)   . Heart attack ([place]) 1998  . Hypertension   . Stroke ([place])   . Thyroid disease      Past Surgical History:  Procedure Laterality Date  . BREAST LUMPECTOMY  1995   Left Breast  . DILATION AND CURETTAGE OF UTERUS  1975  . GALLBLADDER SURGERY  1993     Family History  Problem Relation Age of Onset  . Hypertension Mother   . Stroke Mother   . Stroke Sister   . Stroke Sister   . Stroke Sister  Social History   Substance and Sexual Activity  Drug Use No     Social History   Substance and Sexual Activity  Alcohol Use No     Social History   Tobacco Use  Smoking Status Former Smoker  Smokeless Tobacco Never Used     Outpatient Encounter Medications as of 10/12/2018  Medication Sig  . albuterol  (PROVENTIL HFA;VENTOLIN HFA) 108 (90 Base) MCG/ACT inhaler Inhale 2 puffs into the lungs every 6 (six) hours as needed.  [name] albuterol (PROVENTIL) (2.5 MG/3ML) 0.083% nebulizer solution Take 3 mLs (2.5 mg total) by nebulization every 6 (six) hours as needed for wheezing or shortness of breath.  [name] amLODipine (NORVASC) 2.5 MG tablet Take one tablet by mouth once daily  . ANORO ELLIPTA 62.5-25 MCG/INH AEPB INHALE 1 PUFF INTO THE LUNGS DAILY.  [name] aspirin 81 MG tablet Take 81 mg by mouth daily.  [name] atorvastatin (LIPITOR) 10 MG tablet Take 1 tablet (10 mg total) by mouth daily.  . Calcium Carbonate-Vit D-Min (CALCIUM 1200 PO) Take 1 tablet by mouth.  [name] FLUoxetine (PROZAC) 10 MG capsule Take 1 capsule (10 mg total) by mouth daily.  . fluticasone (FLONASE) 50 MCG/ACT nasal spray Place 1 spray into both nostrils 2 (two) times daily.  . furosemide (LASIX) 20 MG tablet Take 1 tablet (20 mg total) by mouth daily.  [name] ketoconazole (NIZORAL) 2 % shampoo APPLY TO AFFECTED AREA AS DIRECTED 2 TIMES A WEEK.  [name] levothyroxine (SYNTHROID) 50 MCG tablet TAKE 1 TABLET BY MOUTH EVERY DAY  . loratadine (CLARITIN) 10 MG tablet Take 1 tablet (10 mg total) by mouth daily.  . potassium chloride SA (K-DUR) 20 MEQ tablet TAKE 1 TABLET BY MOUTH ONCE DAILY.  [name] risperiDONE (RISPERDAL) 1 MG tablet Take 1 tablet (1 mg total) by mouth 2 (two) times daily.  [name] Spacer/Aero-Holding Chambers DEVI 1 Product by Does not apply route as needed.  . triamcinolone cream (KENALOG) 0.1 % Apply 1 application topically 2 (two) times daily.  . [DISCONTINUED] albuterol (PROVENTIL) (2.5 MG/3ML) 0.083% nebulizer solution Take 3 mLs (2.5 mg total) by nebulization every 6 (six) hours as needed for wheezing or shortness of breath.  . [DISCONTINUED] amLODipine (NORVASC) 2.5 MG tablet Take 1 tablet (2.5 mg total) by mouth daily. PATIENT NEEDS OFFICE VISIT PRIOR TO ANY FURTHER REFILLS  . [DISCONTINUED] atorvastatin (LIPITOR) 10 MG tablet TAKE 1 TABLET BY MOUTH  DAILY.  . [DISCONTINUED] cyclobenzaprine (FLEXERIL) 5 MG tablet Take 0.5 tablets (2.5 mg total) by mouth at bedtime as needed for muscle spasms.  . [DISCONTINUED] FLUoxetine (PROZAC) 10 MG capsule TAKE 1 CAPSULE BY MOUTH DAILY.  . [DISCONTINUED] fluticasone (FLONASE) 50 MCG/ACT nasal spray Place 1 spray 2 (two) times daily into both nostrils.  . [DISCONTINUED] furosemide (LASIX) 20 MG tablet TAKE 1 TABLET BY MOUTH DAILY.  . [DISCONTINUED] levothyroxine (SYNTHROID) 50 MCG tablet TAKE 1 TABLET BY MOUTH EVERY DAY  . [DISCONTINUED] loratadine (CLARITIN) 10 MG tablet Take 10 mg by mouth daily.  . [DISCONTINUED] potassium chloride SA (K-DUR) 20 MEQ tablet TAKE 1 TABLET BY MOUTH ONCE DAILY.  OFFICE VISIT REQUIRED PRIOR TO ANY FURTHER REFILLS  . [DISCONTINUED] risperiDONE (RISPERDAL) 1 MG tablet Take 1 tablet (1 mg total) by mouth 2 (two) times daily. PATIENT NEEDS OFFICE VISIT PRIOR TO ANY FURTHER REFILLS  . benzonatate (TESSALON) 100 MG capsule Take 1 capsule (100 mg total) by mouth 2 (two) times daily as needed for cough. (Patient not taking: Reported on 10/12/2018)  . [  DISCONTINUED] prednisoLONE acetate (PRED FORTE) 1 % ophthalmic suspension Place 1 drop into both eyes 2 (two) times daily.   Facility-Administered Encounter Medications as of 10/12/2018  Medication  . ipratropium-albuterol (DUONEB) 0.5-2.5 (3) MG/3ML nebulizer solution 3 mL    Allergies: Codeine sulfate and Picato [ingenol mebutate]  Body mass index is 26.8 kg/m.  Blood pressure (!) 152/75, pulse (!) 58, temperature 97.6 F (36.4 C), temperature source Oral, height 5' 4.5" (1.638 m), weight 158 lb 9.6 oz (71.9 kg). Review of Systems: General:   Denies fever, chills, unexplained weight loss.  HOH Optho/Auditory:   Denies visual changes, blurred vision/LOV Respiratory:   Denies SOB, DOE more than baseline levels.  Cardiovascular:   Denies chest pain, palpitations, new onset peripheral edema  Gastrointestinal:   Denies nausea,  vomiting, diarrhea.  Genitourinary: Denies dysuria, freq/ urgency, flank pain or discharge from genitals.  Endocrine:     Denies hot or cold intolerance, polyuria, polydipsia. Musculoskeletal:   Denies unexplained myalgias, joint swelling, unexplained arthralgias, gait problems.  Skin:  Denies rash, suspicious lesions Neurological:     Denies dizziness, unexplained weakness, numbness  Psychiatric/Behavioral:   Denies mood changes, suicidal or homicidal ideations, hallucinations This patient does not have sx concerning for COVID-19 Infection (ie; fever, chills, cough, new or worsening shortness of breath).    Observations/Objective: No acute distress noted during the telephone conversation.  Assessment and Plan: Continue all medications as directed. Follow DASH diet Remain as active as possible Continue to social distance and wear a mask when in public  Follow Up Instructions: Fasting labs 1 week Regular f/u 3 months   I discussed the assessment and treatment plan with the patient. The patient was provided an opportunity to ask questions and all were answered. The patient agreed with the plan and demonstrated an understanding of the instructions.   The patient was advised to call back or seek an in-person evaluation if the symptoms worsen or if the condition fails to improve as anticipated.  I provided 15  minutes of non-face-to-face time during this encounter.   Esaw Grandchild, NP

## 2018-10-12 NOTE — Assessment & Plan Note (Signed)
Assessment and Plan: Continue all medications as directed. Follow DASH diet Remain as active as possible Continue to social distance and wear a mask when in public  Follow Up Instructions: Fasting labs 1 week Regular f/u 3 months   I discussed the assessment and treatment plan with the patient. The patient was provided an opportunity to ask questions and all were answered. The patient agreed with the plan and demonstrated an understanding of the instructions.   The patient was advised to call back or seek an in-person evaluation if the symptoms worsen or if the condition fails to improve as anticipated.

## 2018-10-16 ENCOUNTER — Other Ambulatory Visit: Payer: Self-pay

## 2018-10-16 ENCOUNTER — Other Ambulatory Visit: Payer: Medicare HMO

## 2018-10-16 DIAGNOSIS — E039 Hypothyroidism, unspecified: Secondary | ICD-10-CM | POA: Diagnosis not present

## 2018-10-16 DIAGNOSIS — Z0000 Encounter for general adult medical examination without abnormal findings: Secondary | ICD-10-CM | POA: Diagnosis not present

## 2018-10-16 DIAGNOSIS — I10 Essential (primary) hypertension: Secondary | ICD-10-CM

## 2018-10-17 LAB — COMPREHENSIVE METABOLIC PANEL
ALT: 14 IU/L (ref 0–32)
AST: 27 IU/L (ref 0–40)
Albumin/Globulin Ratio: 1.5 (ref 1.2–2.2)
Albumin: 4 g/dL (ref 3.5–4.6)
Alkaline Phosphatase: 86 IU/L (ref 39–117)
BUN/Creatinine Ratio: 21 (ref 12–28)
BUN: 25 mg/dL (ref 10–36)
Bilirubin Total: 0.3 mg/dL (ref 0.0–1.2)
CO2: 18 mmol/L — ABNORMAL LOW (ref 20–29)
Calcium: 9.2 mg/dL (ref 8.7–10.3)
Chloride: 111 mmol/L — ABNORMAL HIGH (ref 96–106)
Creatinine, Ser: 1.17 mg/dL — ABNORMAL HIGH (ref 0.57–1.00)
GFR calc Af Amer: 47 mL/min/{1.73_m2} — ABNORMAL LOW (ref >59)
GFR calc non Af Amer: 41 mL/min/{1.73_m2} — ABNORMAL LOW (ref >59)
Globulin, Total: 2.7 g/dL (ref 1.5–4.5)
Glucose: 95 mg/dL (ref 65–99)
Potassium: 4.2 mmol/L (ref 3.5–5.2)
Sodium: 145 mmol/L — ABNORMAL HIGH (ref 134–144)
Total Protein: 6.7 g/dL (ref 6.0–8.5)

## 2018-10-17 LAB — LIPID PANEL
Chol/HDL Ratio: 2.2 ratio (ref 0.0–4.4)
Cholesterol, Total: 150 mg/dL (ref 100–199)
HDL: 67 mg/dL (ref >39)
LDL Chol Calc (NIH): 68 mg/dL (ref 0–99)
Triglycerides: 79 mg/dL (ref 0–149)
VLDL Cholesterol Cal: 15 mg/dL (ref 5–40)

## 2018-10-17 LAB — HEMOGLOBIN A1C

## 2018-10-17 LAB — TSH: TSH: 8.17 u[IU]/mL — ABNORMAL HIGH (ref 0.450–4.500)

## 2018-10-17 LAB — CBC WITH DIFFERENTIAL/PLATELET

## 2018-10-18 ENCOUNTER — Other Ambulatory Visit: Payer: Self-pay | Admitting: Adult Health

## 2018-10-18 DIAGNOSIS — E039 Hypothyroidism, unspecified: Secondary | ICD-10-CM

## 2018-10-23 ENCOUNTER — Telehealth: Payer: Self-pay | Admitting: Adult Health

## 2018-10-23 NOTE — Telephone Encounter (Signed)
Tia Alert (daughter of patient, [name]) called, saying she missed a call on Thursday about Lab Results for Donna Arias and wants the call returned for the results.   --- forwarding to CMA --- AM

## 2018-10-24 ENCOUNTER — Other Ambulatory Visit: Payer: Self-pay | Admitting: Adult Health

## 2018-10-24 DIAGNOSIS — E039 Hypothyroidism, unspecified: Secondary | ICD-10-CM

## 2018-10-24 MED ORDER — LEVOTHYROXINE SODIUM 25 MCG PO TABS: ORAL_TABLET | ORAL | 0 refills | Status: DC

## 2018-10-24 NOTE — Telephone Encounter (Signed)
Pt's daughter informed of results.  Charyl Bigger, CMA

## 2018-10-25 ENCOUNTER — Telehealth: Payer: Self-pay | Admitting: Adult Health

## 2018-10-25 NOTE — Telephone Encounter (Signed)
Pt's daughter informed of results.  Daughter expressed understanding and is agreeable.  Charyl Bigger, CMA

## 2018-10-25 NOTE — Telephone Encounter (Signed)
LVM for pt's daughter to call to discuss results.  T. Kordell Jafri, CMA  

## 2018-10-25 NOTE — Telephone Encounter (Signed)
Patient's daughter [name] said she spoke with [place] yesterday and would like to have a return call again as she has some additional questions to ask.

## 2018-11-06 ENCOUNTER — Other Ambulatory Visit: Payer: Self-pay

## 2018-11-06 ENCOUNTER — Encounter: Payer: Self-pay | Admitting: Adult Health

## 2018-11-06 ENCOUNTER — Ambulatory Visit (INDEPENDENT_AMBULATORY_CARE_PROVIDER_SITE_OTHER): Payer: Medicare HMO | Admitting: Adult Health

## 2018-11-06 VITALS — BP 109/65 | HR 44 | Temp 98.7°F | Ht 65.5 in | Wt 163.0 lb

## 2018-11-06 DIAGNOSIS — J449 Chronic obstructive pulmonary disease, unspecified: Secondary | ICD-10-CM | POA: Diagnosis not present

## 2018-11-06 DIAGNOSIS — H6123 Impacted cerumen, bilateral: Secondary | ICD-10-CM

## 2018-11-06 DIAGNOSIS — Z0000 Encounter for general adult medical examination without abnormal findings: Secondary | ICD-10-CM

## 2018-11-06 DIAGNOSIS — Z23 Encounter for immunization: Secondary | ICD-10-CM

## 2018-11-06 NOTE — Patient Instructions (Signed)
Earwax Buildup, Adult The ears produce a substance called earwax that helps keep bacteria out of the ear and protects the skin in the ear canal. Occasionally, earwax can build up in the ear and cause discomfort or hearing loss. What increases the risk? This condition is more likely to develop in people who:  Are female.  Are elderly.  Naturally produce more earwax.  Clean their ears often with cotton swabs.  Use earplugs often.  Use in-ear headphones often.  Wear hearing aids.  Have narrow ear canals.  Have earwax that is overly thick or sticky.  Have eczema.  Are dehydrated.  Have excess hair in the ear canal. What are the signs or symptoms? Symptoms of this condition include:  Reduced or muffled hearing.  A feeling of fullness in the ear or feeling that the ear is plugged.  Fluid coming from the ear.  Ear pain.  Ear itch.  Ringing in the ear.  Coughing.  An obvious piece of earwax that can be seen inside the ear canal. How is this diagnosed? This condition may be diagnosed based on:  Your symptoms.  Your medical history.  An ear exam. During the exam, your health care provider will look into your ear with an instrument called an otoscope. You may have tests, including a hearing test. How is this treated? This condition may be treated by:  Using ear drops to soften the earwax.  Having the earwax removed by a health care provider. The health care provider may: ? Flush the ear with water. ? Use an instrument that has a loop on the end (curette). ? Use a suction device.  Surgery to remove the wax buildup. This may be done in severe cases. Follow these instructions at home:   Take over-the-counter and prescription medicines only as told by your health care provider.  Do not put any objects, including cotton swabs, into your ear. You can clean the opening of your ear canal with a washcloth or facial tissue.  Follow instructions from your health care  provider about cleaning your ears. Do not over-clean your ears.  Drink enough fluid to keep your urine clear or pale yellow. This will help to thin the earwax.  Keep all follow-up visits as told by your health care provider. If earwax builds up in your ears often or if you use hearing aids, consider seeing your health care provider for routine, preventive ear cleanings. Ask your health care provider how often you should schedule your cleanings.  If you have hearing aids, clean them according to instructions from the manufacturer and your health care provider. Contact a health care provider if:  You have ear pain.  You develop a fever.  You have blood, pus, or other fluid coming from your ear.  You have hearing loss.  You have ringing in your ears that does not go away.  Your symptoms do not improve with treatment.  You feel like the room is spinning (vertigo). Summary  Earwax can build up in the ear and cause discomfort or hearing loss.  The most common symptoms of this condition include reduced or muffled hearing and a feeling of fullness in the ear or feeling that the ear is plugged.  This condition may be diagnosed based on your symptoms, your medical history, and an ear exam.  This condition may be treated by using ear drops to soften the earwax or by having the earwax removed by a health care provider.  Do not put any   objects, including cotton swabs, into your ear. You can clean the opening of your ear canal with a washcloth or facial tissue. This information is not intended to replace advice given to you by your health care provider. Make sure you discuss any questions you have with your health care provider. Document Released: 02/19/2004 Document Revised: 12/24/2016 Document Reviewed: 03/24/2016 Elsevier Patient Education  2020 [place] all medications as directed. Please keep lab appt in few weeks, to have TSH level checked. Continue to social distance  and wear a mask when in public. Follow-up 3-4 months. GREAT TO SEE YOU!

## 2018-11-06 NOTE — Addendum Note (Signed)
Addended by: Fonnie Mu on: 11/06/2018 05:09 PM   Modules accepted: Orders

## 2018-11-06 NOTE — Progress Notes (Signed)
Subjective:    Patient ID: Donna Arias, female    DOB: 1927/01/06, 85 y.o.   MRN: [id number]  HPI:  [name] presents bil ear cerumen and reduced hearing that began 4 weeks ago. She has hx of recurrent excessive cerumen that often requires ear lavage. She denies dizziness or imbalance issues. She reports medication compliance, and reports slight decrease in fatigue levels with recent increase in Levothyroxine dosage. Daughter "[name]" at [facility]  Patient Care Team    Relationship Specialty Notifications Start End  Esaw Grandchild, NP PCP - General Family Medicine  04/14/16     Patient Active Problem List   Diagnosis Date Noted  . Excessive cerumen in ear canal, bilateral 11/06/2018  . Eye infection, left 01/16/2018  . Ear fullness, left 01/16/2018  . Multiple atypical nevi 06/23/2017  . Ankle edema, bilateral 05/02/2017  . Postnasal drip 12/13/2016  . Acute bronchitis with COPD ([place]) 12/13/2016  . Orthostatic hypotension 11/22/2016  . Hyperlipidemia 11/22/2016  . Anxiety 11/22/2016  . Dysarthria 11/22/2016  . Weight gain 11/18/2016  . Edema of right foot 11/18/2016  . Age-related osteoporosis without current pathological fracture 11/16/2016  . Cough in adult 11/16/2016  . Healthcare maintenance 10/14/2016  . Diaper dermatitis 06/10/2016  . Antibiotic-induced yeast infection 06/10/2016  . Hearing loss secondary to cerumen impaction, bilateral 06/10/2016  . Chronic bilateral low back pain 04/19/2016  . Chronic neck pain 04/19/2016  . Medication refill 04/14/2016  . Joint laxity of right knee 04/14/2016  . Hypothyroidism 02/04/2016  . Upper back pain 08/01/2009  . Transient cerebral ischemia 09/22/2007  . Dementia with behavioral disturbance ([place]) 10/07/2006  . Essential hypertension 10/07/2006  . COPD (chronic obstructive pulmonary disease) ([place]) 10/07/2006  . GERD 10/07/2006     Past Medical History:  Diagnosis Date  . Cancer (HCC)    squamous cell of left face  .  COPD (chronic obstructive pulmonary disease) ([place])   . Dementia ([place])   . Dementia ([place])   . GERD (gastroesophageal reflux disease)   . Heart attack ([place]) 1998  . Hypertension   . Stroke ([place])   . Thyroid disease      Past Surgical History:  Procedure Laterality Date  . BREAST LUMPECTOMY  1995   Left Breast  . DILATION AND CURETTAGE OF UTERUS  1975  . GALLBLADDER SURGERY  1993     Family History  Problem Relation Age of Onset  . Hypertension Mother   . Stroke Mother   . Stroke Sister   . Stroke Sister   . Stroke Sister      Social History   Substance and Sexual Activity  Drug Use No     Social History   Substance and Sexual Activity  Alcohol Use No     Social History   Tobacco Use  Smoking Status Former Smoker  Smokeless Tobacco Never Used     Outpatient Encounter Medications as of 11/06/2018  Medication Sig  . albuterol (PROVENTIL HFA;VENTOLIN HFA) 108 (90 Base) MCG/ACT inhaler Inhale 2 puffs into the lungs every 6 (six) hours as needed.  [name] albuterol (PROVENTIL) (2.5 MG/3ML) 0.083% nebulizer solution Take 3 mLs (2.5 mg total) by nebulization every 6 (six) hours as needed for wheezing or shortness of breath.  [name] amLODipine (NORVASC) 2.5 MG tablet Take one tablet by mouth once daily  . ANORO ELLIPTA 62.5-25 MCG/INH AEPB INHALE 1 PUFF INTO THE LUNGS DAILY.  [name] aspirin 81 MG tablet Take 81 mg by mouth daily.  [name]  atorvastatin (LIPITOR) 10 MG tablet Take 1 tablet (10 mg total) by mouth daily.  . Baclofen 5 MG TABS Take 1 tablet by mouth at bedtime as needed.  . benzonatate (TESSALON) 100 MG capsule Take 1 capsule (100 mg total) by mouth 2 (two) times daily as needed for cough.  . Calcium Carbonate-Vit D-Min (CALCIUM 1200 PO) Take 1 tablet by mouth.  [name] FLUoxetine (PROZAC) 10 MG capsule Take 1 capsule (10 mg total) by mouth daily.  . fluticasone (FLONASE) 50 MCG/ACT nasal spray Place 1 spray into both nostrils 2 (two) times daily.  . furosemide (LASIX) 20 MG  tablet Take 1 tablet (20 mg total) by mouth daily.  [name] ketoconazole (NIZORAL) 2 % shampoo APPLY TO AFFECTED AREA AS DIRECTED 2 TIMES A WEEK.  [name] levothyroxine (SYNTHROID) 25 MCG tablet 1/2 tablet daily with 49mcg to equal 62.3mcg total daily  . levothyroxine (SYNTHROID) 50 MCG tablet TAKE 1 TABLET BY MOUTH EVERY DAY  . loratadine (CLARITIN) 10 MG tablet Take 1 tablet (10 mg total) by mouth daily.  . potassium chloride SA (K-DUR) 20 MEQ tablet TAKE 1 TABLET BY MOUTH ONCE DAILY.  [name] risperiDONE (RISPERDAL) 1 MG tablet Take 1 tablet (1 mg total) by mouth 2 (two) times daily.  [name] Spacer/Aero-Holding Chambers DEVI 1 Product by Does not apply route as needed.  . triamcinolone cream (KENALOG) 0.1 % Apply 1 application topically 2 (two) times daily. Request jar   Facility-Administered Encounter Medications as of 11/06/2018  Medication  . ipratropium-albuterol (DUONEB) 0.5-2.5 (3) MG/3ML nebulizer solution 3 mL    Allergies: Codeine sulfate and Picato [ingenol mebutate]  Body mass index is 26.71 kg/m.  Blood pressure 109/65, pulse (!) 44, temperature 98.7 F (37.1 C), temperature source Oral, height 5' 5.5" (1.664 m), weight 163 lb (73.9 kg), SpO2 97 %.   Review of Systems  Constitutional: Positive for fatigue. Negative for activity change, appetite change, chills, diaphoresis, fever and unexpected weight change.  HENT: Positive for hearing loss. Negative for congestion, ear discharge, ear pain and postnasal drip.   Eyes: Negative for pain, discharge and itching.  Respiratory: Negative for cough, chest tightness, shortness of breath, wheezing and stridor.   Cardiovascular: Negative for chest pain, palpitations and leg swelling.  Neurological: Negative for dizziness and headaches.  Hematological: Negative for adenopathy. Does not bruise/bleed easily.       Objective:   Physical Exam Vitals signs and nursing note reviewed.  Constitutional:      General: She is not in acute distress.     Appearance: Normal appearance. She is not ill-appearing, toxic-appearing or diaphoretic.  HENT:     Head: Normocephalic and atraumatic.     Right Ear: Decreased hearing noted. There is impacted cerumen. Tympanic membrane is not retracted.     Left Ear: Decreased hearing noted. There is impacted cerumen. Tympanic membrane is not retracted.     Ears:     Comments: Large amounts of cerumen removed- pt tolerated well     Nose: Nose normal. No congestion.     Mouth/Throat:     Mouth: Mucous membranes are moist.  Cardiovascular:     Rate and Rhythm: Normal rate and regular rhythm.     Pulses: Normal pulses.     Heart sounds: Normal heart sounds. No murmur. No friction rub. No gallop.   Skin:    Capillary Refill: Capillary refill takes less than 2 seconds.  Neurological:     Mental Status: She is alert and oriented to person, place,  and time.  Psychiatric:        Mood and Affect: Mood normal.        Behavior: Behavior normal.        Thought Content: Thought content normal.       Assessment & Plan:   1. Excessive cerumen in ear canal, bilateral   2. Healthcare maintenance     Excessive cerumen in ear canal, bilateral Tolerated ear lavage well, large amounts of cerumen removed.   Healthcare maintenance Continue all medications as directed. Please keep lab appt in few weeks, to have TSH level checked. Continue to social distance and wear a mask when in public. Follow-up 3-4 months.    FOLLOW-UP:  Return in about 3 months (around 02/06/2019) for Regular Follow Up.

## 2018-11-06 NOTE — Assessment & Plan Note (Signed)
Continue all medications as directed. Please keep lab appt in few weeks, to have TSH level checked. Continue to social distance and wear a mask when in public. Follow-up 3-4 months.

## 2018-11-06 NOTE — Assessment & Plan Note (Signed)
Tolerated ear lavage well, large amounts of cerumen removed.

## 2018-11-08 ENCOUNTER — Other Ambulatory Visit: Payer: Self-pay | Admitting: Adult Health

## 2018-11-08 DIAGNOSIS — Z09 Encounter for follow-up examination after completed treatment for conditions other than malignant neoplasm: Secondary | ICD-10-CM

## 2018-12-04 DIAGNOSIS — H35012 Changes in retinal vascular appearance, left eye: Secondary | ICD-10-CM | POA: Diagnosis not present

## 2018-12-04 DIAGNOSIS — H35372 Puckering of macula, left eye: Secondary | ICD-10-CM | POA: Diagnosis not present

## 2018-12-04 DIAGNOSIS — H353222 Exudative age-related macular degeneration, left eye, with inactive choroidal neovascularization: Secondary | ICD-10-CM | POA: Diagnosis not present

## 2018-12-04 DIAGNOSIS — H353121 Nonexudative age-related macular degeneration, left eye, early dry stage: Secondary | ICD-10-CM | POA: Diagnosis not present

## 2018-12-06 ENCOUNTER — Other Ambulatory Visit: Payer: Medicare HMO

## 2018-12-06 ENCOUNTER — Other Ambulatory Visit: Payer: Self-pay

## 2018-12-06 DIAGNOSIS — E039 Hypothyroidism, unspecified: Secondary | ICD-10-CM | POA: Diagnosis not present

## 2018-12-07 LAB — TSH: TSH: 2.67 u[IU]/mL (ref 0.450–4.500)

## 2018-12-12 ENCOUNTER — Other Ambulatory Visit: Payer: Self-pay | Admitting: Adult Health

## 2018-12-12 DIAGNOSIS — Z09 Encounter for follow-up examination after completed treatment for conditions other than malignant neoplasm: Secondary | ICD-10-CM

## 2018-12-18 ENCOUNTER — Other Ambulatory Visit: Payer: Self-pay | Admitting: Adult Health

## 2019-01-01 ENCOUNTER — Other Ambulatory Visit: Payer: Self-pay | Admitting: Adult Health

## 2019-01-09 ENCOUNTER — Other Ambulatory Visit: Payer: Self-pay | Admitting: Adult Health

## 2019-01-09 DIAGNOSIS — Z09 Encounter for follow-up examination after completed treatment for conditions other than malignant neoplasm: Secondary | ICD-10-CM

## 2019-01-15 ENCOUNTER — Other Ambulatory Visit: Payer: Self-pay | Admitting: Adult Health

## 2019-01-29 ENCOUNTER — Other Ambulatory Visit: Payer: Self-pay | Admitting: Adult Health

## 2019-02-05 ENCOUNTER — Other Ambulatory Visit: Payer: Self-pay | Admitting: Adult Health

## 2019-02-05 DIAGNOSIS — Z09 Encounter for follow-up examination after completed treatment for conditions other than malignant neoplasm: Secondary | ICD-10-CM

## 2019-02-05 DIAGNOSIS — Z760 Encounter for issue of repeat prescription: Secondary | ICD-10-CM

## 2019-02-06 ENCOUNTER — Telehealth: Payer: Self-pay

## 2019-02-06 NOTE — Telephone Encounter (Signed)
Please call pt to schedule f/u OV.  No further refills until pt is seen.  Charyl Bigger, CMA

## 2019-02-08 ENCOUNTER — Telehealth: Payer: Self-pay | Admitting: Adult Health

## 2019-02-08 ENCOUNTER — Other Ambulatory Visit: Payer: Self-pay | Admitting: Adult Health

## 2019-02-08 ENCOUNTER — Telehealth: Payer: Self-pay

## 2019-02-08 MED ORDER — TIOTROPIUM BROMIDE-OLODATEROL 2.5-2.5 MCG/ACT IN AERS: 2.0000 | INHALATION_SPRAY | Freq: Every day | RESPIRATORY_TRACT | 1 refills | Status: DC

## 2019-02-08 NOTE — Telephone Encounter (Signed)
Pt's daughter informed.  Daughter states that she will back in the morning to schedule this appointment.  Charyl Bigger, CMA

## 2019-02-08 NOTE — Telephone Encounter (Signed)
Good Afternoon Tonya, Can you please call Ms. Tobin Chad and tell her I sent in new rx for [employer]. Also please have her schedule OV in the next few weeks for regular f/u- for BP check and CMP re-check  Thanks! Valetta Fuller

## 2019-02-08 NOTE — Telephone Encounter (Signed)
Pt's daughter called stating that Donna Arias is no longer covered by insurance.  Insurance will cover [employer].  Can pt switch to this medication?  Please advise.  Charyl Bigger, CMA

## 2019-02-12 ENCOUNTER — Encounter: Payer: Self-pay | Admitting: Adult Health

## 2019-02-12 ENCOUNTER — Ambulatory Visit (INDEPENDENT_AMBULATORY_CARE_PROVIDER_SITE_OTHER): Payer: Medicare HMO | Admitting: Adult Health

## 2019-02-12 ENCOUNTER — Other Ambulatory Visit: Payer: Self-pay

## 2019-02-12 VITALS — BP 124/81 | HR 70 | Temp 98.8°F | Ht 65.5 in | Wt 164.6 lb

## 2019-02-12 DIAGNOSIS — N1832 Chronic kidney disease, stage 3b: Secondary | ICD-10-CM | POA: Diagnosis not present

## 2019-02-12 DIAGNOSIS — Z0000 Encounter for general adult medical examination without abnormal findings: Secondary | ICD-10-CM | POA: Diagnosis not present

## 2019-02-12 DIAGNOSIS — E039 Hypothyroidism, unspecified: Secondary | ICD-10-CM | POA: Diagnosis not present

## 2019-02-12 DIAGNOSIS — E785 Hyperlipidemia, unspecified: Secondary | ICD-10-CM

## 2019-02-12 DIAGNOSIS — I10 Essential (primary) hypertension: Secondary | ICD-10-CM | POA: Diagnosis not present

## 2019-02-12 NOTE — Assessment & Plan Note (Signed)
Ambulatory readings SBP 120-150 DBP 70-90s Blood pressure-stable. Continue to monitor at home. Call clinic if consistently <110/60 or >160/100. Continue Amlodipine 2.5mg  QD, Furosemide 20mg  QD, K Chloride 20 mEq QD

## 2019-02-12 NOTE — Assessment & Plan Note (Signed)
Lipid Panel 10/2017 LDL- 62 Atorvastatin 10mg  QD Lipids drawn today

## 2019-02-12 NOTE — Assessment & Plan Note (Signed)
TSH rechecked today- she is currently on levothyroxine 62.42mcg QD

## 2019-02-12 NOTE — Patient Instructions (Addendum)
DASH Eating Plan DASH stands for "Dietary Approaches to Stop Hypertension." The DASH eating plan is a healthy eating plan that has been shown to reduce high blood pressure (hypertension). It may also reduce your risk for type 2 diabetes, heart disease, and stroke. The DASH eating plan may also help with weight loss. What are tips for following this plan?  General guidelines  Avoid eating more than 2,300 mg (milligrams) of salt (sodium) a day. If you have hypertension, you may need to reduce your sodium intake to 1,500 mg a day.  Limit alcohol intake to no more than 1 drink a day for nonpregnant women and 2 drinks a day for men. One drink equals 12 oz of beer, 5 oz of wine, or 1 oz of hard liquor.  Work with your health care Donna Arias to maintain a healthy body weight or to lose weight. Ask what an ideal weight is for you.  Get at least 30 minutes of exercise that causes your heart to beat faster (aerobic exercise) most days of the week. Activities may include walking, swimming, or biking.  Work with your health care Donna Arias or diet and nutrition specialist (dietitian) to adjust your eating plan to your individual calorie needs. Reading food labels   Check food labels for the amount of sodium per serving. Choose foods with less than 5 percent of the Daily Value of sodium. Generally, foods with less than 300 mg of sodium per serving fit into this eating plan.  To find whole grains, look for the word "whole" as the first word in the ingredient list. Shopping  Buy products labeled as "low-sodium" or "no salt added."  Buy fresh foods. Avoid canned foods and premade or frozen meals. Cooking  Avoid adding salt when cooking. Use salt-free seasonings or herbs instead of table salt or sea salt. Check with your health care Donna Arias or pharmacist before using salt substitutes.  Do not fry foods. Cook foods using healthy methods such as baking, boiling, grilling, and broiling instead.  Cook with  heart-healthy oils, such as olive, canola, soybean, or sunflower oil. Meal planning  Eat a balanced diet that includes: ? 5 or more servings of fruits and vegetables each day. At each meal, try to fill half of your plate with fruits and vegetables. ? Up to 6-8 servings of whole grains each day. ? Less than 6 oz of lean meat, poultry, or fish each day. A 3-oz serving of meat is about the same size as a deck of cards. One egg equals 1 oz. ? 2 servings of low-fat dairy each day. ? A serving of nuts, seeds, or beans 5 times each week. ? Heart-healthy fats. Healthy fats called Omega-3 fatty acids are found in foods such as flaxseeds and coldwater fish, like sardines, salmon, and mackerel.  Limit how much you eat of the following: ? Canned or prepackaged foods. ? Food that is high in trans fat, such as fried foods. ? Food that is high in saturated fat, such as fatty meat. ? Sweets, desserts, sugary drinks, and other foods with added sugar. ? Full-fat dairy products.  Do not salt foods before eating.  Try to eat at least 2 vegetarian meals each week.  Eat more home-cooked food and less restaurant, buffet, and fast food.  When eating at a restaurant, ask that your food be prepared with less salt or no salt, if possible. What foods are recommended? The items listed may not be a complete list. Talk with your dietitian about   what dietary choices are best for you. Grains Whole-grain or whole-wheat bread. Whole-grain or whole-wheat pasta. Brown rice. Oatmeal. Quinoa. Bulgur. Whole-grain and low-sodium cereals. Pita bread. Low-fat, low-sodium crackers. Whole-wheat flour tortillas. Vegetables Fresh or frozen vegetables (raw, steamed, roasted, or grilled). Low-sodium or reduced-sodium tomato and vegetable juice. Low-sodium or reduced-sodium tomato sauce and tomato paste. Low-sodium or reduced-sodium canned vegetables. Fruits All fresh, dried, or frozen fruit. Canned fruit in natural juice (without  added sugar). Meat and other protein foods Skinless chicken or turkey. Ground chicken or turkey. Pork with fat trimmed off. Fish and seafood. Egg whites. Dried beans, peas, or lentils. Unsalted nuts, nut butters, and seeds. Unsalted canned beans. Lean cuts of beef with fat trimmed off. Low-sodium, lean deli meat. Dairy Low-fat (1%) or fat-free (skim) milk. Fat-free, low-fat, or reduced-fat cheeses. Nonfat, low-sodium ricotta or cottage cheese. Low-fat or nonfat yogurt. Low-fat, low-sodium cheese. Fats and oils Soft margarine without trans fats. Vegetable oil. Low-fat, reduced-fat, or light mayonnaise and salad dressings (reduced-sodium). Canola, safflower, olive, soybean, and sunflower oils. Avocado. Seasoning and other foods Herbs. Spices. Seasoning mixes without salt. Unsalted popcorn and pretzels. Fat-free sweets. What foods are not recommended? The items listed may not be a complete list. Talk with your dietitian about what dietary choices are best for you. Grains Baked goods made with fat, such as croissants, muffins, or some breads. Dry pasta or rice meal packs. Vegetables Creamed or fried vegetables. Vegetables in a cheese sauce. Regular canned vegetables (not low-sodium or reduced-sodium). Regular canned tomato sauce and paste (not low-sodium or reduced-sodium). Regular tomato and vegetable juice (not low-sodium or reduced-sodium). Pickles. Olives. Fruits Canned fruit in a light or heavy syrup. Fried fruit. Fruit in cream or butter sauce. Meat and other protein foods Fatty cuts of meat. Ribs. Fried meat. Bacon. Sausage. Bologna and other processed lunch meats. Salami. Fatback. Hotdogs. Bratwurst. Salted nuts and seeds. Canned beans with added salt. Canned or smoked fish. Whole eggs or egg yolks. Chicken or turkey with skin. Dairy Whole or 2% milk, cream, and half-and-half. Whole or full-fat cream cheese. Whole-fat or sweetened yogurt. Full-fat cheese. Nondairy creamers. Whipped toppings.  Processed cheese and cheese spreads. Fats and oils Butter. Stick margarine. Lard. Shortening. Ghee. Bacon fat. Tropical oils, such as coconut, palm kernel, or palm oil. Seasoning and other foods Salted popcorn and pretzels. Onion salt, garlic salt, seasoned salt, table salt, and sea salt. Worcestershire sauce. Tartar sauce. Barbecue sauce. Teriyaki sauce. Soy sauce, including reduced-sodium. Steak sauce. Canned and packaged gravies. Fish sauce. Oyster sauce. Cocktail sauce. Horseradish that you find on the shelf. Ketchup. Mustard. Meat flavorings and tenderizers. Bouillon cubes. Hot sauce and Tabasco sauce. Premade or packaged marinades. Premade or packaged taco seasonings. Relishes. Regular salad dressings. Where to find more information:  National Heart, Lung, and Blood Institute: www.nhlbi.nih.gov  American Heart Association: www.heart.org Summary  The DASH eating plan is a healthy eating plan that has been shown to reduce high blood pressure (hypertension). It may also reduce your risk for type 2 diabetes, heart disease, and stroke.  With the DASH eating plan, you should limit salt (sodium) intake to 2,300 mg a day. If you have hypertension, you may need to reduce your sodium intake to 1,500 mg a day.  When on the DASH eating plan, aim to eat more fresh fruits and vegetables, whole grains, lean proteins, low-fat dairy, and heart-healthy fats.  Work with your health care Donna Arias or diet and nutrition specialist (dietitian) to adjust your eating plan to your   individual calorie needs. This information is not intended to replace advice given to you by your health care Donna Arias. Make sure you discuss any questions you have with your health care Donna Arias. Document Revised: 12/24/2016 Document Reviewed: 01/05/2016 Elsevier Patient Education  2020 [employer].   Blood pressure-stable. Continue to monitor at home. Call clinic if consistently <110/60 or >160/100. Continue all medications as  directed. Remain well hydrated, follow heart healthy diet. We will contact you when lab results are available. Continue to social distance and wear a mask when in public. If you would like to schedule the COVID-19 vaccine- please call [phone]. Follow-up with primary care in 6 months. GREAT TO SEE YOU!

## 2019-02-12 NOTE — Progress Notes (Signed)
Subjective:    Patient ID: Donna Arias, female    DOB: [birth year], 86 y.o.   MRN: [id number]  HPI:  Donna Arias is here for regular f/uL HTN, Hypothyrodism, plus labs Per daughter (who is at [facility])- Ambulatory readings SBP 120-150 DBP 70-90s Blood pressure-stable. Continue to monitor at home. Call clinic if consistently <110/60 or >160/100. Continue Amlodipine 2.5mg  QD, Furosemide 20mg  QD, K Chloride 20 mEq QD Pt denise acute cardiac sx's  TSH rechecked today- she is currently on levothyroxine 62.79mcg QD   She denies recent falls  She reports felling "pretty darn good". Answered multiple questions/concerns about COVID-19 vaccine and reviewed all current guidelines. She denies hx of anaphylactic rx or known allergy to polyethylene glycol.  Patient Care Team    Relationship Specialty Notifications Start End  Esaw Grandchild, NP PCP - General Family Medicine  04/14/16     Patient Active Problem List   Diagnosis Date Noted  . Excessive cerumen in ear canal, bilateral 11/06/2018  . Eye infection, left 01/16/2018  . Ear fullness, left 01/16/2018  . Multiple atypical nevi 06/23/2017  . Ankle edema, bilateral 05/02/2017  . Postnasal drip 12/13/2016  . Acute bronchitis with COPD ([place]) 12/13/2016  . Orthostatic hypotension 11/22/2016  . Hyperlipidemia 11/22/2016  . Anxiety 11/22/2016  . Dysarthria 11/22/2016  . Weight gain 11/18/2016  . Edema of right foot 11/18/2016  . Age-related osteoporosis without current pathological fracture 11/16/2016  . Cough in adult 11/16/2016  . Healthcare maintenance 10/14/2016  . Diaper dermatitis 06/10/2016  . Antibiotic-induced yeast infection 06/10/2016  . Hearing loss secondary to cerumen impaction, bilateral 06/10/2016  . Chronic bilateral low back pain 04/19/2016  . Chronic neck pain 04/19/2016  . Medication refill 04/14/2016  . Joint laxity of right knee 04/14/2016  . Hypothyroidism 02/04/2016  . Upper back pain 08/01/2009  .  Transient cerebral ischemia 09/22/2007  . Dementia with behavioral disturbance ([place]) 10/07/2006  . Essential hypertension 10/07/2006  . COPD (chronic obstructive pulmonary disease) ([place]) 10/07/2006  . GERD 10/07/2006     Past Medical History:  Diagnosis Date  . Cancer (HCC)    squamous cell of left face  . COPD (chronic obstructive pulmonary disease) ([place])   . Dementia ([place])   . Dementia ([place])   . GERD (gastroesophageal reflux disease)   . Heart attack ([place]) 1998  . Hypertension   . Stroke ([place])   . Thyroid disease      Past Surgical History:  Procedure Laterality Date  . BREAST LUMPECTOMY  1995   Left Breast  . DILATION AND CURETTAGE OF UTERUS  1975  . GALLBLADDER SURGERY  1993     Family History  Problem Relation Age of Onset  . Hypertension Mother   . Stroke Mother   . Stroke Sister   . Stroke Sister   . Stroke Sister      Social History   Substance and Sexual Activity  Drug Use No     Social History   Substance and Sexual Activity  Alcohol Use No     Social History   Tobacco Use  Smoking Status Former Smoker  Smokeless Tobacco Never Used     Outpatient Encounter Medications as of 02/12/2019  Medication Sig  . albuterol (PROVENTIL HFA;VENTOLIN HFA) 108 (90 Base) MCG/ACT inhaler Inhale 2 puffs into the lungs every 6 (six) hours as needed.  [name] albuterol (PROVENTIL) (2.5 MG/3ML) 0.083% nebulizer solution Take 3 mLs (2.5 mg total) by nebulization every 6 (six) hours as  needed for wheezing or shortness of breath.  [name] amLODipine (NORVASC) 2.5 MG tablet TAKE ONE TABLET BY MOUTH ONCE DAILY  . aspirin 81 MG tablet Take 81 mg by mouth daily.  [name] atorvastatin (LIPITOR) 10 MG tablet TAKE 1 TABLET BY MOUTH DAILY.  . Baclofen 5 MG TABS Take 1 tablet by mouth at bedtime as needed.  . benzonatate (TESSALON) 100 MG capsule Take 1 capsule (100 mg total) by mouth 2 (two) times daily as needed for cough.  . Calcium Carbonate-Vit D-Min (CALCIUM 1200 PO) Take 1  tablet by mouth.  [name] FLUoxetine (PROZAC) 10 MG capsule Take 1 capsule (10 mg total) by mouth daily.  . fluticasone (FLONASE) 50 MCG/ACT nasal spray Place 1 spray into both nostrils 2 (two) times daily.  . furosemide (LASIX) 20 MG tablet Take 1 tablet (20 mg total) by mouth daily.  [name] ketoconazole (NIZORAL) 2 % shampoo APPLY TO AFFECTED AREA AS DIRECTED 2 TIMES A WEEK.  [name] levothyroxine (SYNTHROID) 25 MCG tablet TAKE 1/2 TABLET BY MOUTH DAILY WITH 50 MCG TO EQUAL 62.5 MCG TOTAL DAILY  . levothyroxine (SYNTHROID) 50 MCG tablet TAKE 1 TABLET BY MOUTH EVERY DAY  . loratadine (CLARITIN) 10 MG tablet Take 1 tablet (10 mg total) by mouth daily.  . potassium chloride SA (KLOR-CON) 20 MEQ tablet TAKE 1 TABLET BY MOUTH ONCE DAILY.  OFFICE VISIT REQUIRED PRIOR TO ANY FURTHER REFILLS  . risperiDONE (RISPERDAL) 1 MG tablet Take 1 tablet (1 mg total) by mouth 2 (two) times daily.  [name] Spacer/Aero-Holding Chambers DEVI 1 Product by Does not apply route as needed.  . Tiotropium Bromide-Olodaterol 2.5-2.5 MCG/ACT AERS Inhale 2 puffs into the lungs daily.  [name] triamcinolone cream (KENALOG) 0.1 % Apply 1 application topically 2 (two) times daily. Request jar   Facility-Administered Encounter Medications as of 02/12/2019  Medication  . ipratropium-albuterol (DUONEB) 0.5-2.5 (3) MG/3ML nebulizer solution 3 mL    Allergies: Codeine sulfate and Picato [ingenol mebutate]  Body mass index is 26.97 kg/m.  Blood pressure 124/81, pulse 70, temperature 98.8 F (37.1 C), temperature source Oral, height 5' 5.5" (1.664 m), weight 164 lb 9.6 oz (74.7 kg), SpO2 97 %.  Review of Systems  Constitutional: Positive for fatigue. Negative for activity change, appetite change, chills, diaphoresis, fever and unexpected weight change.  HENT: Negative for congestion.   Eyes: Negative for visual disturbance.  Respiratory: Negative for cough, chest tightness, shortness of breath, wheezing and stridor.   Cardiovascular: Negative for  chest pain, palpitations and leg swelling.  Gastrointestinal: Negative for abdominal distention, abdominal pain, blood in stool, constipation, diarrhea, nausea and vomiting.  Endocrine: Negative for polydipsia, polyphagia and polyuria.  Genitourinary: Negative for difficulty urinating and flank pain.  Musculoskeletal: Positive for arthralgias, back pain and myalgias. Negative for gait problem, joint swelling, neck pain and neck stiffness.  Neurological: Negative for dizziness and headaches.  Hematological: Negative for adenopathy. Does not bruise/bleed easily.  Psychiatric/Behavioral: Negative for agitation, behavioral problems, confusion, decreased concentration, dysphoric mood, hallucinations, self-injury, sleep disturbance and suicidal ideas. The patient is not nervous/anxious and is not hyperactive.        Objective:   Physical Exam Vitals and nursing note reviewed.  Constitutional:      General: She is not in acute distress.    Appearance: Normal appearance. She is normal weight. She is not ill-appearing, toxic-appearing or diaphoretic.  HENT:     Head: Normocephalic and atraumatic.     Right Ear: Decreased hearing noted.  Left Ear: Decreased hearing noted.  Eyes:     Extraocular Movements: Extraocular movements intact.     Conjunctiva/sclera: Conjunctivae normal.     Pupils: Pupils are equal, round, and reactive to light.  Cardiovascular:     Rate and Rhythm: Normal rate and regular rhythm.     Pulses: Normal pulses.     Heart sounds: Normal heart sounds. No murmur. No friction rub. No gallop.   Pulmonary:     Effort: Pulmonary effort is normal. No respiratory distress.     Breath sounds: Normal breath sounds. No stridor. No wheezing, rhonchi or rales.  Chest:     Chest wall: No tenderness.  Skin:    Capillary Refill: Capillary refill takes less than 2 seconds.  Neurological:     Mental Status: She is alert and oriented to person, place, and time.  Psychiatric:         Mood and Affect: Mood normal.        Behavior: Behavior normal.        Thought Content: Thought content normal.        Judgment: Judgment normal.        Assessment & Plan:   1. Hypothyroidism, unspecified type   2. Stage 3b chronic kidney disease   3. Healthcare maintenance   4. Essential hypertension   5. Hyperlipidemia, unspecified hyperlipidemia type     Healthcare maintenance Blood pressure-stable. Continue to monitor at home. Call clinic if consistently <110/60 or >160/100. Continue all medications as directed. Remain well hydrated, follow heart healthy diet. We will contact you when lab results are available. Continue to social distance and wear a mask when in public. If you would like to schedule the COVID-19 vaccine- please call [phone]. Follow-up with primary care in 6 months.  Essential hypertension Ambulatory readings SBP 120-150 DBP 70-90s Blood pressure-stable. Continue to monitor at home. Call clinic if consistently <110/60 or >160/100. Continue Amlodipine 2.5mg  QD, Furosemide 20mg  QD, K Chloride 20 mEq QD  Hypothyroidism TSH rechecked today- she is currently on levothyroxine 62.20mcg QD   Hyperlipidemia Lipid Panel 10/2017 LDL- 62 Atorvastatin 10mg  QD Lipids drawn today    FOLLOW-UP:  Return in about 6 months (around 08/12/2019) for Regular Follow Up, HTN, Hypercholestermia.

## 2019-02-12 NOTE — Assessment & Plan Note (Signed)
Blood pressure-stable. Continue to monitor at home. Call clinic if consistently <110/60 or >160/100. Continue all medications as directed. Remain well hydrated, follow heart healthy diet. We will contact you when lab results are available. Continue to social distance and wear a mask when in public. If you would like to schedule the COVID-19 vaccine- please call [phone]. Follow-up with primary care in 6 months.

## 2019-02-13 LAB — COMPREHENSIVE METABOLIC PANEL
ALT: 11 IU/L (ref 0–32)
AST: 17 IU/L (ref 0–40)
Albumin/Globulin Ratio: 1.5 (ref 1.2–2.2)
Albumin: 4 g/dL (ref 3.5–4.6)
Alkaline Phosphatase: 87 IU/L (ref 39–117)
BUN/Creatinine Ratio: 28 (ref 12–28)
BUN: 31 mg/dL (ref 10–36)
Bilirubin Total: 0.2 mg/dL (ref 0.0–1.2)
CO2: 25 mmol/L (ref 20–29)
Calcium: 9.7 mg/dL (ref 8.7–10.3)
Chloride: 100 mmol/L (ref 96–106)
Creatinine, Ser: 1.12 mg/dL — ABNORMAL HIGH (ref 0.57–1.00)
GFR calc Af Amer: 50 mL/min/{1.73_m2} — ABNORMAL LOW (ref >59)
GFR calc non Af Amer: 43 mL/min/{1.73_m2} — ABNORMAL LOW (ref >59)
Globulin, Total: 2.6 g/dL (ref 1.5–4.5)
Glucose: 97 mg/dL (ref 65–99)
Potassium: 4.2 mmol/L (ref 3.5–5.2)
Sodium: 137 mmol/L (ref 134–144)
Total Protein: 6.6 g/dL (ref 6.0–8.5)

## 2019-02-13 LAB — TSH: TSH: 1.64 u[IU]/mL (ref 0.450–4.500)

## 2019-02-13 LAB — CBC
Hematocrit: 40.9 % (ref 34.0–46.6)
Hemoglobin: 13.7 g/dL (ref 11.1–15.9)
MCH: 31.1 pg (ref 26.6–33.0)
MCHC: 33.5 g/dL (ref 31.5–35.7)
MCV: 93 fL (ref 79–97)
Platelets: 175 10*3/uL (ref 150–450)
RBC: 4.4 x10E6/uL (ref 3.77–5.28)
RDW: 14.3 % (ref 11.7–15.4)
WBC: 6.4 10*3/uL (ref 3.4–10.8)

## 2019-02-13 LAB — HEMOGLOBIN A1C
Est. average glucose Bld gHb Est-mCnc: 117 mg/dL
Hgb A1c MFr Bld: 5.7 % — ABNORMAL HIGH (ref 4.8–5.6)

## 2019-02-28 ENCOUNTER — Other Ambulatory Visit: Payer: Self-pay | Admitting: Adult Health

## 2019-03-07 ENCOUNTER — Other Ambulatory Visit: Payer: Self-pay | Admitting: Adult Health

## 2019-03-07 DIAGNOSIS — Z760 Encounter for issue of repeat prescription: Secondary | ICD-10-CM

## 2019-03-19 ENCOUNTER — Other Ambulatory Visit: Payer: Self-pay | Admitting: Adult Health

## 2019-03-19 DIAGNOSIS — Z760 Encounter for issue of repeat prescription: Secondary | ICD-10-CM

## 2019-04-01 ENCOUNTER — Ambulatory Visit: Payer: Medicare HMO | Attending: Internal Medicine

## 2019-04-01 DIAGNOSIS — Z23 Encounter for immunization: Secondary | ICD-10-CM | POA: Insufficient documentation

## 2019-04-01 NOTE — Progress Notes (Signed)
   Covid-19 Vaccination Clinic  Name:  Donna Arias    MRN: [id number] DOB: 1926-11-13  04/01/2019  [name] was observed post Covid-19 immunization for 15 minutes without incident. She was provided with Vaccine Information Sheet and instruction to access the V-Safe system.   [name] was instructed to call 911 with any severe reactions post vaccine: [name] Difficulty breathing  . Swelling of face and throat  . A fast heartbeat  . A bad rash all over body  . Dizziness and weakness   Immunizations Administered    Name Date Dose VIS Date Route   Pfizer COVID-19 Vaccine 04/01/2019 10:02 AM 0.3 mL 01/05/2019 Intramuscular   Manufacturer: [place]   Lot: [id number]   [place]: [id number]

## 2019-04-16 ENCOUNTER — Other Ambulatory Visit: Payer: Self-pay | Admitting: Adult Health

## 2019-04-18 ENCOUNTER — Other Ambulatory Visit: Payer: Self-pay | Admitting: Adult Health

## 2019-04-18 MED ORDER — AMLODIPINE BESYLATE 2.5 MG PO TABS: 2.5000 mg | ORAL_TABLET | Freq: Every day | ORAL | 0 refills | Status: DC

## 2019-04-18 NOTE — Telephone Encounter (Signed)
Refill sent to pharmacy, AS, CMA 

## 2019-04-18 NOTE — Telephone Encounter (Signed)
Pt's daughter/[name] called states Valetta Fuller was suppose to fill this Rx for (3) months supply :  amLODipine (NORVASC) 2.5 MG tablet [id number]   Order Details Dose, Route, Frequency: As Directed  Dispense Quantity: 90 tablet Refills: 0   Indications of Use: Hypertension      Sig: TAKE ONE TABLET BY MOUTH ONCE DAILY       --Forwarding request to med asst to send refill order to :  [place], [place] - [place] [phone] (Phone) [phone] (Fax)   --glh

## 2019-05-01 ENCOUNTER — Ambulatory Visit: Payer: Medicare HMO | Attending: Internal Medicine

## 2019-05-01 DIAGNOSIS — Z23 Encounter for immunization: Secondary | ICD-10-CM

## 2019-05-01 NOTE — Progress Notes (Signed)
   Covid-19 Vaccination Clinic  Name:  Donna Arias    MRN: [id number] DOB: 1926/06/01  05/01/2019  Donna Arias was observed post Covid-19 immunization for 15 minutes without incident. She was provided with Vaccine Information Sheet and instruction to access the V-Safe system.   Donna Arias was instructed to call 911 with any severe reactions post vaccine: [name] Difficulty breathing  . Swelling of face and throat  . A fast heartbeat  . A bad rash all over body  . Dizziness and weakness   Immunizations Administered    Name Date Dose VIS Date Route   Pfizer COVID-19 Vaccine 05/01/2019 10:55 AM 0.3 mL 01/05/2019 Intramuscular   Manufacturer: [employer]   Lot: [id number]   [place]: [id number]

## 2019-05-14 ENCOUNTER — Encounter: Payer: Self-pay | Admitting: Emergency Medicine

## 2019-05-14 ENCOUNTER — Ambulatory Visit
Admission: EM | Admit: 2019-05-14 | Discharge: 2019-05-14 | Disposition: A | Discharge status: Home / Self Care | Payer: Medicare HMO | Attending: Emergency Medicine | Admitting: Emergency Medicine

## 2019-05-14 ENCOUNTER — Other Ambulatory Visit: Payer: Self-pay | Admitting: Adult Health

## 2019-05-14 ENCOUNTER — Other Ambulatory Visit: Payer: Self-pay

## 2019-05-14 ENCOUNTER — Telehealth: Payer: Self-pay | Admitting: Adult Health

## 2019-05-14 DIAGNOSIS — B029 Zoster without complications: Secondary | ICD-10-CM

## 2019-05-14 DIAGNOSIS — Z760 Encounter for issue of repeat prescription: Secondary | ICD-10-CM

## 2019-05-14 MED ORDER — VALACYCLOVIR HCL 1 G PO TABS: 1000.0000 mg | ORAL_TABLET | Freq: Three times a day (TID) | ORAL | 0 refills | Status: DC

## 2019-05-14 NOTE — Discharge Instructions (Signed)
Take valtrex 3 times daily for the next week. Follow up with PCP for repeat evaluation in 1 week.

## 2019-05-14 NOTE — ED Provider Notes (Signed)
[facility]    CSN: [id number] Arrival date & time: 05/14/19  1123      History   Chief Complaint Chief Complaint  Patient presents with  . Rash    HPI Donna Arias is a 86 y.o. female with history of dementia, hypertension, SCC presenting with her daughter/caregiver for concern of low back pain with new rash.  States rash developed over left low back, left groin area a few days ago.  Has tried triamcinolone, zinc oxide without relief.  Tried baclofen without significant relief of low back pain.  Denies trauma, fall.  No fever, chills, arthralgias, myalgias.    Past Medical History:  Diagnosis Date  . Cancer (HCC)    squamous cell of left face  . COPD (chronic obstructive pulmonary disease) ([place])   . Dementia ([place])   . Dementia ([place])   . GERD (gastroesophageal reflux disease)   . Heart attack ([place]) 1998  . Hypertension   . Stroke ([place])   . Thyroid disease     Patient Active Problem List   Diagnosis Date Noted  . Excessive cerumen in ear canal, bilateral 11/06/2018  . Eye infection, left 01/16/2018  . Ear fullness, left 01/16/2018  . Multiple atypical nevi 06/23/2017  . Ankle edema, bilateral 05/02/2017  . Postnasal drip 12/13/2016  . Acute bronchitis with COPD ([place]) 12/13/2016  . Orthostatic hypotension 11/22/2016  . Hyperlipidemia 11/22/2016  . Anxiety 11/22/2016  . Dysarthria 11/22/2016  . Weight gain 11/18/2016  . Edema of right foot 11/18/2016  . Age-related osteoporosis without current pathological fracture 11/16/2016  . Cough in adult 11/16/2016  . Healthcare maintenance 10/14/2016  . Diaper dermatitis 06/10/2016  . Antibiotic-induced yeast infection 06/10/2016  . Hearing loss secondary to cerumen impaction, bilateral 06/10/2016  . Chronic bilateral low back pain 04/19/2016  . Chronic neck pain 04/19/2016  . Medication refill 04/14/2016  . Joint laxity of right knee 04/14/2016  . Hypothyroidism 02/04/2016  . Upper back pain 08/01/2009    . Transient cerebral ischemia 09/22/2007  . Dementia with behavioral disturbance ([place]) 10/07/2006  . Essential hypertension 10/07/2006  . COPD (chronic obstructive pulmonary disease) ([place]) 10/07/2006  . GERD 10/07/2006    Past Surgical History:  Procedure Laterality Date  . BREAST LUMPECTOMY  1995   Left Breast  . DILATION AND CURETTAGE OF UTERUS  1975  . GALLBLADDER SURGERY  1993    OB History   No obstetric history on file.      Home Medications    Prior to Admission medications   Medication Sig Start Date End Date Taking? Authorizing Provider  albuterol (PROVENTIL HFA;VENTOLIN HFA) 108 (90 Base) MCG/ACT inhaler Inhale 2 puffs into the lungs every 6 (six) hours as needed. 02/09/17   Danford, Valetta Fuller D, NP  albuterol (PROVENTIL) (2.5 MG/3ML) 0.083% nebulizer solution Take 3 mLs (2.5 mg total) by nebulization every 6 (six) hours as needed for wheezing or shortness of breath. 10/12/18   Danford, Valetta Fuller D, NP  amLODipine (NORVASC) 2.5 MG tablet Take 1 tablet (2.5 mg total) by mouth daily. 04/18/19   Mellody Dance, DO  aspirin 81 MG tablet Take 81 mg by mouth daily.    [provider]  atorvastatin (LIPITOR) 10 MG tablet TAKE 1 TABLET BY MOUTH DAILY. 02/28/19   Danford, Valetta Fuller D, NP  Baclofen 5 MG TABS Take 1 tablet by mouth at bedtime as needed. 10/12/18   Danford, Valetta Fuller D, NP  benzonatate (TESSALON) 100 MG capsule Take 1 capsule (100 mg total)  by mouth 2 (two) times daily as needed for cough. 10/12/18   Danford, Valetta Fuller D, NP  Calcium Carbonate-Vit D-Min (CALCIUM 1200 PO) Take 1 tablet by mouth.    [provider]  FLUoxetine (PROZAC) 10 MG capsule Take 1 capsule (10 mg total) by mouth daily. 10/12/18   Danford, Valetta Fuller D, NP  fluticasone (FLONASE) 50 MCG/ACT nasal spray Place 1 spray into both nostrils 2 (two) times daily. 10/12/18   Danford, Valetta Fuller D, NP  furosemide (LASIX) 20 MG tablet Take 1 tablet (20 mg total) by mouth daily. 10/12/18   Danford, Valetta Fuller D, NP  ketoconazole  (NIZORAL) 2 % shampoo APPLY TO AFFECTED AREA AS DIRECTED 2 TIMES A WEEK. 01/01/19   Danford, Valetta Fuller D, NP  levothyroxine (SYNTHROID) 25 MCG tablet TAKE 1/2 TABLET BY MOUTH DAILY WITH 50 MCG TO EQUAL 62.5 MCG TOTAL DAILY 03/19/19   Opalski, Neoma Laming, DO  levothyroxine (SYNTHROID) 50 MCG tablet TAKE 1 TABLET BY MOUTH EVERY DAY 10/12/18   Danford, Valetta Fuller D, NP  loratadine (CLARITIN) 10 MG tablet Take 1 tablet (10 mg total) by mouth daily. 10/12/18   Danford, Valetta Fuller D, NP  potassium chloride SA (KLOR-CON) 20 MEQ tablet TAKE 1 TABLET BY MOUTH ONCE DAILY. OFFICE VISIT REQUIRED PRIOR TO ANY FURTHER REFILLS 03/07/19   Danford, Valetta Fuller D, NP  risperiDONE (RISPERDAL) 1 MG tablet TAKE 1 TABLET BY MOUTH 2 TIMES DAILY. 03/19/19   Mellody Dance, DO  Spacer/Aero-Holding Chambers DEVI 1 Product by Does not apply route as needed. 12/08/17   Danford, Valetta Fuller D, NP  Tiotropium Bromide-Olodaterol 2.5-2.5 MCG/ACT AERS Inhale 2 puffs into the lungs daily. 02/08/19   Danford, Valetta Fuller D, NP  triamcinolone cream (KENALOG) 0.1 % Apply 1 application topically 2 (two) times daily. Request jar 10/12/18   Danford, Valetta Fuller D, NP  valACYclovir (VALTREX) 1000 MG tablet Take 1 tablet (1,000 mg total) by mouth 3 (three) times daily. 05/14/19   Hall-Potvin, [place], PA-C    Family History Family History  Problem Relation Age of Onset  . Hypertension Mother   . Stroke Mother   . Stroke Sister   . Stroke Sister   . Stroke Sister     Social History Social History   Tobacco Use  . Smoking status: Former Research scientist (life sciences)  . Smokeless tobacco: Never Used  Substance Use Topics  . Alcohol use: No  . Drug use: No     Allergies   Codeine sulfate and Picato [ingenol mebutate]   Review of Systems As per HPI   Physical Exam Triage Vital Signs ED Triage Vitals  Enc Vitals Group     BP      Pulse      Resp      Temp      Temp src      SpO2      Weight      Height      Head Circumference      Peak Flow      Pain Score      Pain Loc      Pain  Edu?      Excl. in [place]?    No data found.  Updated Vital Signs BP 125/82 (BP Location: Right Arm)   Pulse 63   Temp 98 F (36.7 C) (Oral)   Resp (!) 22   SpO2 96%   Visual Acuity Right Eye Distance:   Left Eye Distance:   Bilateral Distance:    Right Eye Near:   Left Eye Near:  Bilateral Near:     Physical Exam Constitutional:      General: She is not in acute distress. HENT:     Head: Normocephalic and atraumatic.  Eyes:     General: No scleral icterus.    Pupils: Pupils are equal, round, and reactive to light.  Cardiovascular:     Rate and Rhythm: Normal rate.  Pulmonary:     Effort: Pulmonary effort is normal.  Musculoskeletal:     Comments: No spinous process tenderness, muscle spasms appreciated throughout thoracic, lumbar spines  Skin:    Coloration: Skin is not jaundiced or pale.     Findings: Rash present.     Comments: Shingles noted to left vulva and above gluteal cleft, left side.  No active discharge, warmth, tenderness, streaking.  Neurological:     Mental Status: She is alert and oriented to person, place, and time.      UC Treatments / Results  Labs (all labs ordered are listed, but only abnormal results are displayed) Labs Reviewed - No data to display  EKG   Radiology No results found.  Procedures Procedures (including critical care time)  Medications Ordered in UC Medications - No data to display  Initial Impression / Assessment and Plan / UC Course  I have reviewed the triage vital signs and the nursing notes.  Pertinent labs & imaging results that were available during my care of the patient were reviewed by me and considered in my medical decision making (see chart for details).     Patient afebrile, nontoxic in office today.  H&P consistent with shingles: We will start Valtrex today.  Return precautions discussed, patient's daughter verbalized understanding and is agreeable to plan. Final Clinical Impressions(s) / UC  Diagnoses   Final diagnoses:  Herpes zoster without complication     Discharge Instructions     Take valtrex 3 times daily for the next week. Follow up with PCP for repeat evaluation in 1 week.    ED Prescriptions    Medication Sig Dispense Auth. Provider   valACYclovir (VALTREX) 1000 MG tablet Take 1 tablet (1,000 mg total) by mouth 3 (three) times daily. 21 tablet Hall-Potvin, [place], PA-C     PDMP not reviewed this encounter.   Hall-Potvin, [place], [place] 05/14/19 1314

## 2019-05-14 NOTE — Telephone Encounter (Signed)
Pt's daughter called says pt complaining of back & side pain & rash on legs--Thought it maybe reaction to pain meds  ,but Could also be Shingles-- Seeking OV appt w/ Dr. Marland KitchenAdvised them unfortunately no appts available but strongly suggested Urgent Care for immediate medical attention.  --Pt also request refill on a Rx I am unable to locate in her meds list, forwarding note to med asst to contact [facility] for clarification @ [phone].  --glh

## 2019-05-14 NOTE — Telephone Encounter (Signed)
Spoke with patients daughter and she states patient has had back pain x 1 week and shes been giving her Baclofen for the pain. Patient then broke out in a rash on legs and hip area and she isnt sure if this is shingles or an allergic reaction.   Patient daughter also requesting refill of "muscle relaxer"   I advised [name] that we do not have any appointments to offer patient at this time and that I suggest patient go to [facility] for evaluation.   I advised [name] the muscle relaxer that she is requesting is the Baclofen. Patient daughter verbalized understanding to all of the above and verbalized she was taking patient to UC for evaluation. AS, CMA

## 2019-05-14 NOTE — ED Triage Notes (Signed)
Last week complained of back pain.  History of back pain Baclofen was a new medicine.  Given three nights in a row  Rash on left lower back and left groin area.  Family member has applied triamcinolone cream

## 2019-05-15 NOTE — Telephone Encounter (Signed)
Agree with recommendations given. Patient should be evaluated for rash for appropriate treatment.  Thank you, Lorrene Reid, PA-C

## 2019-05-20 NOTE — Progress Notes (Signed)
Acute Office Visit  Subjective:    Patient ID: Donna Arias, female    DOB: 20-Mar-1926, 86 y.o.   MRN: [id number]  Chief Complaint  Patient presents with  . Follow-up    shingles    HPI Patient is in today for re-evaluation of shingles rash and is accompanied by her daughter. She went to UC on 05/14/2019 for low back pain and a rash, which was determined to be herpes zoster. Pt was started on Valtrex. Daughter reports rash is healing and looking much better. Pt reports she does have intermittent sharp pain and is having trouble sleeping at night due to the pain. Pt denies facial pain or numbness, hearing changes, ear ringing, or eye changes.    Past Medical History:  Diagnosis Date  . Cancer (HCC)    squamous cell of left face  . COPD (chronic obstructive pulmonary disease) ([place])   . Dementia ([place])   . Dementia ([place])   . GERD (gastroesophageal reflux disease)   . Heart attack ([place]) 1998  . Hypertension   . Stroke ([place])   . Thyroid disease     Past Surgical History:  Procedure Laterality Date  . BREAST LUMPECTOMY  1995   Left Breast  . DILATION AND CURETTAGE OF UTERUS  1975  . GALLBLADDER SURGERY  1993    Family History  Problem Relation Age of Onset  . Hypertension Mother   . Stroke Mother   . Stroke Sister   . Stroke Sister   . Stroke Sister     Social History   Socioeconomic History  . Marital status: Widowed    Spouse name: Not on file  . Number of children: 1  . Years of education: Not on file  . Highest education level: Not on file  Occupational History  . Occupation: RETIRED  Tobacco Use  . Smoking status: Former Research scientist (life sciences)  . Smokeless tobacco: Never Used  Substance and Sexual Activity  . Alcohol use: No  . Drug use: No  . Sexual activity: Never    Birth control/protection: None  Other Topics Concern  . Not on file  Social History Narrative   CB x 1   Lives at home with her daughter (since 02/05/2013)   Right handed   Regular exercise - NO    Social Determinants of Health   Financial Resource Strain:   . Difficulty of Paying Living Expenses:   Food Insecurity:   . Worried About Charity fundraiser in the Last Year:   . Arboriculturist in the Last Year:   Transportation Needs:   . Film/video editor (Medical):   [name] Lack of Transportation (Non-Medical):   Physical Activity:   . Days of Exercise per Week:   . Minutes of Exercise per Session:   Stress:   . Feeling of Stress :   Social Connections:   . Frequency of Communication with Friends and Family:   . Frequency of Social Gatherings with Friends and Family:   . Attends Religious Services:   . Active Member of Clubs or Organizations:   . Attends Archivist Meetings:   [name] Marital Status:   Intimate Partner Violence:   . Fear of Current or Ex-Partner:   . Emotionally Abused:   [name] Physically Abused:   . Sexually Abused:     Outpatient Medications Prior to Visit  Medication Sig Dispense Refill  . albuterol (PROVENTIL) (2.5 MG/3ML) 0.083% nebulizer solution Take 3 mLs (2.5 mg total) by  nebulization every 6 (six) hours as needed for wheezing or shortness of breath. 75 mL 12  . amLODipine (NORVASC) 2.5 MG tablet Take 1 tablet (2.5 mg total) by mouth daily. 90 tablet 0  . aspirin 81 MG tablet Take 81 mg by mouth daily.    [name] atorvastatin (LIPITOR) 10 MG tablet TAKE 1 TABLET BY MOUTH DAILY. 90 tablet 0  . Baclofen 5 MG TABS Take 1 tablet by mouth at bedtime as needed. 30 tablet 0  . benzonatate (TESSALON) 100 MG capsule Take 1 capsule (100 mg total) by mouth 2 (two) times daily as needed for cough. 10 capsule 0  . Calcium Carbonate-Vit D-Min (CALCIUM 1200 PO) Take 1 tablet by mouth.    [name] FLUoxetine (PROZAC) 10 MG capsule Take 1 capsule (10 mg total) by mouth daily. 90 capsule 2  . fluticasone (FLONASE) 50 MCG/ACT nasal spray Place 1 spray into both nostrils 2 (two) times daily. 16 g 6  . furosemide (LASIX) 20 MG tablet TAKE 1 TABLET BY MOUTH DAILY. 90 tablet 0   . ketoconazole (NIZORAL) 2 % shampoo APPLY TO AFFECTED AREA AS DIRECTED 2 TIMES A WEEK. 120 mL 1  . levothyroxine (SYNTHROID) 25 MCG tablet TAKE 1/2 TABLET BY MOUTH DAILY WITH 50 MCG TO EQUAL 62.5 MCG TOTAL DAILY 45 tablet 1  . levothyroxine (SYNTHROID) 50 MCG tablet TAKE 1 TABLET BY MOUTH EVERY DAY 90 tablet 1  . loratadine (CLARITIN) 10 MG tablet Take 1 tablet (10 mg total) by mouth daily. 90 tablet 1  . potassium chloride SA (KLOR-CON) 20 MEQ tablet TAKE 1 TABLET BY MOUTH ONCE DAILY. OFFICE VISIT REQUIRED PRIOR TO ANY FURTHER REFILLS 90 tablet 0  . risperiDONE (RISPERDAL) 1 MG tablet TAKE 1 TABLET BY MOUTH 2 TIMES DAILY. 60 tablet 1  . Spacer/Aero-Holding Chambers DEVI 1 Product by Does not apply route as needed. 1 each 1  . STIOLTO RESPIMAT 2.5-2.5 MCG/ACT AERS INHALE 2 PUFFS INTO THE LUNGS DAILY. 4 g 1  . triamcinolone cream (KENALOG) 0.1 % Apply 1 application topically 2 (two) times daily. Request jar 453 g 0  . albuterol (PROVENTIL HFA;VENTOLIN HFA) 108 (90 Base) MCG/ACT inhaler Inhale 2 puffs into the lungs every 6 (six) hours as needed. (Patient not taking: Reported on 05/21/2019) 1 Inhaler 1  . valACYclovir (VALTREX) 1000 MG tablet Take 1 tablet (1,000 mg total) by mouth 3 (three) times daily. 21 tablet 0   Facility-Administered Medications Prior to Visit  Medication Dose Route Frequency Provider Last Rate Last Admin  . ipratropium-albuterol (DUONEB) 0.5-2.5 (3) MG/3ML nebulizer solution 3 mL  3 mL Nebulization Q6H Danford, Katy D, NP   3 mL at 12/08/17 0958    Allergies  Allergen Reactions  . Codeine Sulfate     REACTION: unspecified  . Picato [Ingenol Mebutate] Hives    Review of Systems Review of Systems:  A fourteen system review of systems was performed and found to be positive as per HPI.   Objective:    Physical Exam  General:  Well Developed, appears stated age, in no acute distress.  Neuro:  Alert and oriented,  extra-ocular muscles intact  HEENT:   Normocephalic, atraumatic, no vesicular rash noted on pinna or eyes. Skin:  Warm and dry. Rash along left groin/vulva and back midline above gluteal cleft healing well with crusting present. Cardiac:  RRR Respiratory:  ECTA, Not using accessory muscles, speaking in full sentences- unlabored. Vascular:  Ext warm, no cyanosis apprec.; cap RF less 2 sec. Psych:  No HI/SI, judgement and insight good, Euthymic mood. Full Affect.  BP (!) 82/54   Pulse 82   Temp 97.9 F (36.6 C) (Oral)   Ht 5' 5.5" (1.664 m)   Wt 156 lb 6.4 oz (70.9 kg)   SpO2 96% Comment: on RA  BMI 25.63 kg/m  Wt Readings from Last 3 Encounters:  05/21/19 156 lb 6.4 oz (70.9 kg)  02/12/19 164 lb 9.6 oz (74.7 kg)  11/06/18 163 lb (73.9 kg)    Health Maintenance Due  Topic Date Due  . TETANUS/TDAP  02/25/2018    There are no preventive care reminders to display for this patient.   Lab Results  Component Value Date   TSH 1.640 02/12/2019   Lab Results  Component Value Date   WBC 6.4 02/12/2019   HGB 13.7 02/12/2019   HCT 40.9 02/12/2019   MCV 93 02/12/2019   PLT 175 02/12/2019   Lab Results  Component Value Date   NA 137 02/12/2019   K 4.2 02/12/2019   CO2 25 02/12/2019   GLUCOSE 97 02/12/2019   BUN 31 02/12/2019   CREATININE 1.12 (H) 02/12/2019   BILITOT 0.2 02/12/2019   ALKPHOS 87 02/12/2019   AST 17 02/12/2019   ALT 11 02/12/2019   PROT 6.6 02/12/2019   ALBUMIN 4.0 02/12/2019   CALCIUM 9.7 02/12/2019   GFR 45.15 (L) 08/01/2015   Lab Results  Component Value Date   CHOL 150 10/16/2018   Lab Results  Component Value Date   HDL 67 10/16/2018   Lab Results  Component Value Date   LDLCALC 68 10/16/2018   Lab Results  Component Value Date   TRIG 79 10/16/2018   Lab Results  Component Value Date   CHOLHDL 2.2 10/16/2018   Lab Results  Component Value Date   HGBA1C 5.7 (H) 02/12/2019       Assessment & Plan:   Problem List Items Addressed This Visit    None    Visit  Diagnoses    Herpes zoster without complication    -  Primary   Post herpetic neuralgia       Relevant Medications   pregabalin (LYRICA) 75 MG capsule   Low blood pressure reading           Meds ordered this encounter  Medications  . pregabalin (LYRICA) 75 MG capsule    Sig: Take 1 capsule (75 mg total) by mouth 2 (two) times daily.    Dispense:  60 capsule    Refill:  1    Order Specific Question:   Supervising Provider    AnswerMellody Dance [id number]   Herpes zoster without complication: - Rash is healing well and no longer has any active vesicular lesions.  - Pt completed Valtrex today. - Reassured daughter patient is no longer contagious given her lesions are crusted and she completed med therapy.  - Follow-up if rash worsens or fails to improve.   Post herpetic neuralgia: - Pt reports intermittent sharp pain and difficulty sleeping so will start Lyrica 75 mg BID for 30 days or longer if needed. Lyrica has less side effects compared to Gabapentin and feel it is safer for patient.   Low BP reading: - Today BP is 85/54, which is kind of low for her. Pt is asymptomatic. - Previous blood pressures in the office have been stable and her BP at UC was 125/82. - Daughter reports she checks pt's BP at home and it is usually not  low. Readings range in 130s-150s/80s. - Daughter states patient didn't have her usual amount of breakfast today. - Pt has not taken her blood pressure med yet, and advised her daughter to check BP at home before administering her BP med. If BP <110/70 recommended to hold off on dose for today. - Advised to continue checking BP and HR at home, and if BP staying consistently low then to follow-up for further evaluation.   Follow-up for chronic conditions as scheduled.   Lorrene Reid, PA-C

## 2019-05-21 ENCOUNTER — Other Ambulatory Visit: Payer: Self-pay

## 2019-05-21 ENCOUNTER — Encounter: Payer: Self-pay | Admitting: Physician Assistant

## 2019-05-21 ENCOUNTER — Ambulatory Visit (INDEPENDENT_AMBULATORY_CARE_PROVIDER_SITE_OTHER): Payer: Medicare HMO | Admitting: Physician Assistant

## 2019-05-21 VITALS — BP 82/54 | HR 82 | Temp 97.9°F | Ht 65.5 in | Wt 156.4 lb

## 2019-05-21 DIAGNOSIS — B0229 Other postherpetic nervous system involvement: Secondary | ICD-10-CM

## 2019-05-21 DIAGNOSIS — B029 Zoster without complications: Secondary | ICD-10-CM

## 2019-05-21 DIAGNOSIS — R031 Nonspecific low blood-pressure reading: Secondary | ICD-10-CM

## 2019-05-21 MED ORDER — PREGABALIN 75 MG PO CAPS: 75.0000 mg | ORAL_CAPSULE | Freq: Two times a day (BID) | ORAL | 1 refills | Status: DC

## 2019-05-21 NOTE — Patient Instructions (Signed)
Postherpetic Neuralgia Postherpetic neuralgia (PHN) is nerve pain that occurs after a shingles infection. Shingles is a painful rash that appears on one area of the body, usually on the trunk or face. Shingles is caused by the varicella-zoster virus. This is the same virus that causes chickenpox. In people who have had chickenpox, the virus can resurface years later and cause shingles. You may have PHN if you continue to have pain for 4 months after your shingles rash has gone away. PHN appears in the same area where you had the shingles rash. The pain usually goes away after the rash disappears. Getting a vaccination for shingles can prevent PHN. This vaccine is recommended for people older than 60. It may prevent shingles, and may also lower your risk of PHN if you do get shingles. What are the causes? This condition is caused by damage to your nerves from the varicella-zoster virus. The damage makes your nerves overly sensitive. What increases the risk? The following factors may make you more likely to develop this condition:  Being older than 84 years of age.  Having severe pain before your shingles rash starts.  Having a severe rash.  Having shingles in and around the eye area.  Having a disease that makes your body unable to fight infections (weak immune system). What are the signs or symptoms? The main symptom of this condition is pain. The pain may:  Often be very bad and may be described as stabbing, burning, or feeling like an electric shock.  Come and go or may be there all the time.  Be triggered by light touches on the skin or changes in temperature. You may have itching along with the pain. How is this diagnosed? This condition may be diagnosed based on your symptoms and your history of shingles. Lab studies and other diagnostic tests are usually not needed. How is this treated? There is no cure for this condition. Treatment for PHN will focus on pain relief.  Over-the-counter pain relievers do not usually relieve PHN pain. You may need to work with a pain specialist. Treatment may include:  Antidepressant medicines to help with pain and improve sleep.  Anti-seizure medicines to relieve nerve pain.  Strong pain relievers (opioids).  A numbing patch worn on the skin (lidocaine patch).  Botox (botulinum toxin) injections to block pain signals between nerves and muscles.  Injections of numbing medicine or anti-inflammatory medicines around irritated nerves. Follow these instructions at home:   It may take a long time to recover from PHN. Work closely with your health care provider and develop a good support system at home.  Take over-the-counter and prescription medicines only as told by your health care provider.  Do not drive or use heavy machinery while taking prescription pain medicine.  Wear loose, comfortable clothing.  Cover sensitive areas with a dressing to reduce friction from clothing rubbing on the area.  If directed, put ice on the painful area: ? Put ice in a plastic bag. ? Place a towel between your skin and the bag. ? Leave the ice on for 20 minutes, 2-3 times a day.  Talk to your health care provider if you feel depressed or desperate. Living with long-term pain can be depressing.  Keep all follow-up visits as told by your health care provider. This is important. Contact a health care provider if:  Your medicine is not helping.  You are struggling to manage your pain at home. Summary  Postherpetic neuralgia is a very painful disorder   that can occur after an episode of shingles.  The pain is often severe, burning, electric, or stabbing.  Prescription medicines can be helpful in managing persistent pain.  Getting a vaccination for shingles can prevent PHN. This vaccine is recommended for people older than 60. This information is not intended to replace advice given to you by your health care provider. Make sure  you discuss any questions you have with your health care provider. Document Revised: 12/24/2016 Document Reviewed: 03/30/2016 Elsevier Patient Education  [place] is an infection. It gives you a painful skin rash and blisters that have fluid in them. Shingles is caused by the same germ (virus) that causes chickenpox. Shingles only happens in people who:  Have had chickenpox.  Have been given a shot of medicine (vaccine) to protect against chickenpox. Shingles is rare in this group. The first symptoms of shingles may be itching, tingling, or pain in an area on your skin. A rash will show on your skin a few days or weeks later. The rash is likely to be on one side of your body. The rash usually has a shape like a belt or a band. Over time, the rash turns into fluid-filled blisters. The blisters will break open, change into scabs, and dry up. Medicines may:  Help with pain and itching.  Help you get better sooner.  Help to prevent long-term problems. Follow these instructions at home: Medicines  Take over-the-counter and prescription medicines only as told by your doctor.  Put on an anti-itch cream or numbing cream where you have a rash, blisters, or scabs. Do this as told by your doctor. Helping with itching and discomfort   Put cold, wet cloths (cold compresses) on the area of the rash or blisters as told by your doctor.  Cool baths can help you feel better. Try adding baking soda or dry oatmeal to the water to lessen itching. Do not bathe in hot water. Blister and rash care  Keep your rash covered with a loose bandage (dressing).  Wear loose clothing that does not rub on your rash.  Keep your rash and blisters clean. To do this, wash the area with mild soap and cool water as told by your doctor.  Check your rash every day for signs of infection. Check for: ? More redness, swelling, or pain. ? Fluid or blood. ? Warmth. ? Pus or a bad smell.  Do  not scratch your rash. Do not pick at your blisters. To help you to not scratch: ? Keep your fingernails clean and cut short. ? Wear gloves or mittens when you sleep, if scratching is a problem. General instructions  Rest as told by your doctor.  Keep all follow-up visits as told by your doctor. This is important.  Wash your hands often with soap and water. If soap and water are not available, use hand sanitizer. Doing this lowers your chance of getting a skin infection caused by germs (bacteria).  Your infection can cause chickenpox in people who have never had chickenpox or never got a shot of chickenpox vaccine. If you have blisters that did not change into scabs yet, try not to touch other people or be around other people, especially: ? Babies. ? Pregnant women. ? Children who have areas of red, itchy, or rough skin (eczema). ? Very old people who have transplants. ? People who have a long-term (chronic) sickness, like cancer or AIDS. Contact a doctor if:  Your pain does not  get better with medicine.  Your pain does not get better after the rash heals.  You have any signs of infection in the rash area. These signs include: ? More redness, swelling, or pain around the rash. ? Fluid or blood coming from the rash. ? The rash area feeling warm to the touch. ? Pus or a bad smell coming from the rash. Get help right away if:  The rash is on your face or nose.  You have pain in your face or pain by your eye.  You lose feeling on one side of your face.  You have trouble seeing.  You have ear pain, or you have ringing in your ear.  You have a loss of taste.  Your condition gets worse. Summary  Shingles gives you a painful skin rash and blisters that have fluid in them.  Shingles is an infection. It is caused by the same germ (virus) that causes chickenpox.  Keep your rash covered with a loose bandage (dressing). Wear loose clothing that does not rub on your rash.  If you  have blisters that did not change into scabs yet, try not to touch other people or be around people. This information is not intended to replace advice given to you by your health care provider. Make sure you discuss any questions you have with your health care provider. Document Revised: 05/05/2018 Document Reviewed: 09/15/2016 Elsevier Patient Education  2020 [employer].

## 2019-05-30 ENCOUNTER — Other Ambulatory Visit: Payer: Self-pay | Admitting: Physician Assistant

## 2019-06-04 ENCOUNTER — Other Ambulatory Visit: Payer: Self-pay

## 2019-06-04 ENCOUNTER — Ambulatory Visit (INDEPENDENT_AMBULATORY_CARE_PROVIDER_SITE_OTHER): Payer: Medicare HMO | Admitting: Ophthalmology

## 2019-06-04 ENCOUNTER — Other Ambulatory Visit: Payer: Self-pay | Admitting: Adult Health

## 2019-06-04 ENCOUNTER — Other Ambulatory Visit: Payer: Self-pay | Admitting: Physician Assistant

## 2019-06-04 ENCOUNTER — Encounter (INDEPENDENT_AMBULATORY_CARE_PROVIDER_SITE_OTHER): Payer: Self-pay | Admitting: Ophthalmology

## 2019-06-04 DIAGNOSIS — H35012 Changes in retinal vascular appearance, left eye: Secondary | ICD-10-CM | POA: Insufficient documentation

## 2019-06-04 DIAGNOSIS — H43811 Vitreous degeneration, right eye: Secondary | ICD-10-CM | POA: Diagnosis not present

## 2019-06-04 DIAGNOSIS — H353124 Nonexudative age-related macular degeneration, left eye, advanced atrophic with subfoveal involvement: Secondary | ICD-10-CM

## 2019-06-04 DIAGNOSIS — H353112 Nonexudative age-related macular degeneration, right eye, intermediate dry stage: Secondary | ICD-10-CM | POA: Diagnosis not present

## 2019-06-04 DIAGNOSIS — Z760 Encounter for issue of repeat prescription: Secondary | ICD-10-CM

## 2019-06-04 DIAGNOSIS — H353222 Exudative age-related macular degeneration, left eye, with inactive choroidal neovascularization: Secondary | ICD-10-CM

## 2019-06-04 NOTE — Progress Notes (Signed)
06/04/2019     CHIEF COMPLAINT Patient presents for Retina Follow Up   HISTORY OF PRESENT ILLNESS: Donna Arias is a 86 y.o. female who presents to the clinic today for:   HPI    Retina Follow Up    Patient presents with  Wet AMD.  In left eye.  This started 6 months ago.  Severity is mild.  Duration of 6 months.  Since onset it is stable.          Comments    6 Month AMD F/U OU  Pt denies noticeable changes to [place] OU since last visit. Pt denies ocular pain, flashes of light, or floaters OU.         Last edited by Rockie Neighbours, [place] on 06/04/2019 10:16 AM. (History)      Referring physician: Esaw Grandchild, NP [place],  [place] [id number]  HISTORICAL INFORMATION:   Selected notes from the MEDICAL RECORD NUMBER    Lab Results  Component Value Date   HGBA1C 5.7 (H) 02/12/2019     CURRENT MEDICATIONS: No current outpatient medications on file. (Ophthalmic Drugs)   No current facility-administered medications for this visit. (Ophthalmic Drugs)   Current Outpatient Medications (Other)  Medication Sig  . atorvastatin (LIPITOR) 10 MG tablet TAKE 1 TABLET BY MOUTH DAILY.  [name] albuterol (PROVENTIL HFA;VENTOLIN HFA) 108 (90 Base) MCG/ACT inhaler Inhale 2 puffs into the lungs every 6 (six) hours as needed. (Patient not taking: Reported on 05/21/2019)  . albuterol (PROVENTIL) (2.5 MG/3ML) 0.083% nebulizer solution Take 3 mLs (2.5 mg total) by nebulization every 6 (six) hours as needed for wheezing or shortness of breath.  [name] amLODipine (NORVASC) 2.5 MG tablet Take 1 tablet (2.5 mg total) by mouth daily.  [name] aspirin 81 MG tablet Take 81 mg by mouth daily.  . Baclofen 5 MG TABS Take 1 tablet by mouth at bedtime as needed.  . benzonatate (TESSALON) 100 MG capsule Take 1 capsule (100 mg total) by mouth 2 (two) times daily as needed for cough.  . Calcium Carbonate-Vit D-Min (CALCIUM 1200 PO) Take 1 tablet by mouth.  [name] FLUoxetine (PROZAC) 10 MG capsule Take 1 capsule  (10 mg total) by mouth daily.  . fluticasone (FLONASE) 50 MCG/ACT nasal spray Place 1 spray into both nostrils 2 (two) times daily.  . furosemide (LASIX) 20 MG tablet TAKE 1 TABLET BY MOUTH DAILY.  [name] ketoconazole (NIZORAL) 2 % shampoo APPLY TO AFFECTED AREA AS DIRECTED 2 TIMES A WEEK.  [name] levothyroxine (SYNTHROID) 25 MCG tablet TAKE 1/2 TABLET BY MOUTH DAILY WITH 50 MCG TO EQUAL 62.5 MCG TOTAL DAILY  . levothyroxine (SYNTHROID) 50 MCG tablet TAKE 1 TABLET BY MOUTH EVERY DAY  . loratadine (CLARITIN) 10 MG tablet Take 1 tablet (10 mg total) by mouth daily.  . potassium chloride SA (KLOR-CON) 20 MEQ tablet TAKE 1 TABLET BY MOUTH ONCE DAILY. OFFICE VISIT REQUIRED PRIOR TO ANY FURTHER REFILLS  . pregabalin (LYRICA) 75 MG capsule Take 1 capsule (75 mg total) by mouth 2 (two) times daily.  . risperiDONE (RISPERDAL) 1 MG tablet TAKE 1 TABLET BY MOUTH 2 TIMES DAILY.  [name] Spacer/Aero-Holding Chambers DEVI 1 Product by Does not apply route as needed.  [name] STIOLTO RESPIMAT 2.5-2.5 MCG/ACT AERS INHALE 2 PUFFS INTO THE LUNGS DAILY.  [name] triamcinolone cream (KENALOG) 0.1 % Apply 1 application topically 2 (two) times daily. Request jar   Current Facility-Administered Medications (Other)  Medication Route  . ipratropium-albuterol (DUONEB)  0.5-2.5 (3) MG/3ML nebulizer solution 3 mL Nebulization      REVIEW OF SYSTEMS:    ALLERGIES Allergies  Allergen Reactions  . Codeine Sulfate     REACTION: unspecified  . Picato [Ingenol Mebutate] Hives    PAST MEDICAL HISTORY Past Medical History:  Diagnosis Date  . Cancer (HCC)    squamous cell of left face  . COPD (chronic obstructive pulmonary disease) ([place])   . Dementia ([place])   . Dementia ([place])   . GERD (gastroesophageal reflux disease)   . Heart attack ([place]) 1998  . Hypertension   . Stroke ([place])   . Thyroid disease    Past Surgical History:  Procedure Laterality Date  . BREAST LUMPECTOMY  1995   Left Breast  . DILATION AND CURETTAGE OF UTERUS  1975    . GALLBLADDER SURGERY  1993    FAMILY HISTORY Family History  Problem Relation Age of Onset  . Hypertension Mother   . Stroke Mother   . Stroke Sister   . Stroke Sister   . Stroke Sister     SOCIAL HISTORY Social History   Tobacco Use  . Smoking status: Former Research scientist (life sciences)  . Smokeless tobacco: Never Used  Substance Use Topics  . Alcohol use: No  . Drug use: No         OPHTHALMIC EXAM:  Base Eye Exam    Visual Acuity (ETDRS)      Right Left   Dist cc 20/50 +2 20/400   Dist ph cc NI NI   Correction: Glasses       Tonometry (Tonopen, 10:20 AM)      Right Left   Pressure 09 11       Pupils      Pupils Dark Light Shape React APD   Right PERRL 4 3 Round Slow None   Left PERRL 4 3 Round Slow None       Visual Fields      Left Right    Full Full       Extraocular Movement      Right Left    Full Full       Neuro/Psych    Oriented x3: Yes   Mood/Affect: Normal       Dilation    Both eyes: 1.0% Mydriacyl, 2.5% Phenylephrine @ 10:20 AM        Slit Lamp and Fundus Exam    External Exam      Right Left   External Normal Normal       Slit Lamp Exam      Right Left   Lids/Lashes Normal Normal   Conjunctiva/Sclera White and quiet White and quiet   Cornea Clear Clear   Anterior Chamber Deep and quiet Deep and quiet   Iris Round and reactive Round and reactive   Anterior Vitreous Normal Normal       Fundus Exam      Right Left   Posterior Vitreous Posterior vitreous detachment Posterior vitreous detachment   Disc Peripapillary atrophy Peripapillary atrophy   C/D Ratio 0.65 0.6   Macula Retinal pigment epithelial mottling, no macular thickening, Retinal atrophy, Early age related macular degeneration Advanced age related macular degeneration, Retinal pigment epithelial atrophy - geographic   Vessels Normal Normal   Periphery Normal Normal          IMAGING AND PROCEDURES  Imaging and Procedures for 06/04/19  OCT, Retina - OU - Both Eyes        Right Eye  Quality was good. Scan locations included subfoveal. Central Foveal Thickness: 288. Progression has been stable. Findings include retinal drusen .   Left Eye Quality was good. Scan locations included subfoveal. Central Foveal Thickness: 173. Progression has been stable. Findings include abnormal foveal contour, outer retinal atrophy, central retinal atrophy, inner retinal atrophy, no IRF, no SRF.   Notes OD, age-related maculopathy, intermediate, no active CN VM  OS, with area of large retinal atrophy along the inferotemporal arcade.  No active maculopathy, will observe.                ASSESSMENT/PLAN:  Exudative age-related macular degeneration of left eye with inactive choroidal neovascularization (HCC) No active CN VM OS observe      ICD-10-CM   1. Exudative age-related macular degeneration of left eye with inactive choroidal neovascularization (HCC)  H35.3222 OCT, Retina - OU - Both Eyes  2. Posterior vitreous detachment of right eye  H43.811   3. Retinal macroaneurysm of left eye  H35.012   4. Advanced nonexudative age-related macular degeneration of left eye with subfoveal involvement  H35.3124   5. Intermediate stage nonexudative age-related macular degeneration of right eye  H35.3112     1.  OS with a history of wet age-related maculopathy, no active disease currently.  Will observe  2.  3.  Ophthalmic Meds Ordered this visit:  No orders of the defined types were placed in this encounter.      No follow-ups on file.  There are no Patient Instructions on file for this visit.   Explained the diagnoses, plan, and follow up with the patient and they expressed understanding.  Patient expressed understanding of the importance of proper follow up care.   Clent Demark Luticia Tadros M.D. Diseases & Surgery of the Retina and Vitreous Retina & Diabetic [employer] 06/04/19     Abbreviations: M myopia (nearsighted); A astigmatism; H hyperopia (farsighted);  P presbyopia; Mrx spectacle prescription;  CTL contact lenses; OD right eye; OS left eye; OU both eyes  XT exotropia; ET esotropia; PEK punctate epithelial keratitis; PEE punctate epithelial erosions; DES dry eye syndrome; MGD meibomian gland dysfunction; ATs artificial tears; PFAT's preservative free artificial tears; [place] nuclear sclerotic cataract; PSC posterior subcapsular cataract; ERM epi-retinal membrane; PVD posterior vitreous detachment; RD retinal detachment; DM diabetes mellitus; DR diabetic retinopathy; NPDR non-proliferative diabetic retinopathy; PDR proliferative diabetic retinopathy; CSME clinically significant macular edema; DME diabetic macular edema; dbh dot blot hemorrhages; CWS cotton wool spot; POAG primary open angle glaucoma; C/D cup-to-disc ratio; HVF humphrey visual field; GVF goldmann visual field; OCT optical coherence tomography; IOP intraocular pressure; BRVO Branch retinal vein occlusion; CRVO central retinal vein occlusion; CRAO central retinal artery occlusion; BRAO branch retinal artery occlusion; RT retinal tear; SB scleral buckle; PPV pars plana vitrectomy; VH Vitreous hemorrhage; PRP panretinal laser photocoagulation; IVK intravitreal kenalog; VMT vitreomacular traction; MH Macular hole;  NVD neovascularization of the disc; NVE neovascularization elsewhere; AREDS age related eye disease study; ARMD age related macular degeneration; POAG primary open angle glaucoma; EBMD epithelial/anterior basement membrane dystrophy; ACIOL anterior chamber intraocular lens; IOL intraocular lens; PCIOL posterior chamber intraocular lens; Phaco/IOL phacoemulsification with intraocular lens placement; [place] photorefractive keratectomy; LASIK laser assisted in situ keratomileusis; HTN hypertension; DM diabetes mellitus; COPD chronic obstructive pulmonary disease

## 2019-06-04 NOTE — Assessment & Plan Note (Signed)
No active CN VM OS observe

## 2019-07-11 ENCOUNTER — Other Ambulatory Visit: Payer: Self-pay | Admitting: Physician Assistant

## 2019-07-11 DIAGNOSIS — E039 Hypothyroidism, unspecified: Secondary | ICD-10-CM

## 2019-07-11 MED ORDER — LEVOTHYROXINE SODIUM 50 MCG PO TABS: ORAL_TABLET | ORAL | 0 refills | Status: DC

## 2019-07-16 ENCOUNTER — Other Ambulatory Visit: Payer: Self-pay | Admitting: Physician Assistant

## 2019-07-18 ENCOUNTER — Other Ambulatory Visit: Payer: Self-pay | Admitting: Family Medicine

## 2019-07-18 DIAGNOSIS — Z760 Encounter for issue of repeat prescription: Secondary | ICD-10-CM

## 2019-07-24 ENCOUNTER — Other Ambulatory Visit: Payer: Self-pay | Admitting: Physician Assistant

## 2019-07-24 DIAGNOSIS — Z760 Encounter for issue of repeat prescription: Secondary | ICD-10-CM

## 2019-07-24 MED ORDER — AMLODIPINE BESYLATE 2.5 MG PO TABS: 2.5000 mg | ORAL_TABLET | Freq: Every day | ORAL | 0 refills | Status: DC

## 2019-07-24 MED ORDER — RISPERIDONE 1 MG PO TABS: 1.0000 mg | ORAL_TABLET | Freq: Two times a day (BID) | ORAL | 0 refills | Status: DC

## 2019-07-24 NOTE — Telephone Encounter (Signed)
Refill sent to pharmacy. AS, CMA 

## 2019-07-24 NOTE — Telephone Encounter (Signed)
Patient called request refill on :   amLODipine (NORVASC) 2.5 MG tablet [[id number]]   Order Details Dose: 2.5 mg Route: Oral Frequency: Daily  Dispense Quantity: 90 tablet Refills: 0   Indications of Use: Hypertension      Sig: Take 1 tablet (2.5 mg total) by mouth daily.   &   risperiDONE (RISPERDAL) 1 MG tablet [[id number]]   Order Details Dose, Route, Frequency: As Directed  Dispense Quantity: 60 tablet Refills: 1       Sig: TAKE 1 TABLET BY MOUTH 2 TIMES DAILY.        -- Pt uses  :   [facility], [place] Phone:  [phone]  Fax:  [phone]     --Dion Body

## 2019-08-01 ENCOUNTER — Other Ambulatory Visit: Payer: Self-pay | Admitting: Physician Assistant

## 2019-08-01 DIAGNOSIS — Z760 Encounter for issue of repeat prescription: Secondary | ICD-10-CM

## 2019-08-07 ENCOUNTER — Other Ambulatory Visit: Payer: Self-pay

## 2019-08-07 ENCOUNTER — Other Ambulatory Visit: Payer: Medicare HMO

## 2019-08-07 DIAGNOSIS — Z0000 Encounter for general adult medical examination without abnormal findings: Secondary | ICD-10-CM

## 2019-08-07 DIAGNOSIS — E785 Hyperlipidemia, unspecified: Secondary | ICD-10-CM

## 2019-08-07 DIAGNOSIS — R7989 Other specified abnormal findings of blood chemistry: Secondary | ICD-10-CM

## 2019-08-07 DIAGNOSIS — I10 Essential (primary) hypertension: Secondary | ICD-10-CM

## 2019-08-07 DIAGNOSIS — E039 Hypothyroidism, unspecified: Secondary | ICD-10-CM | POA: Diagnosis not present

## 2019-08-08 LAB — COMPREHENSIVE METABOLIC PANEL
ALT: 11 IU/L (ref 0–32)
AST: 18 IU/L (ref 0–40)
Albumin/Globulin Ratio: 1.4 (ref 1.2–2.2)
Albumin: 3.8 g/dL (ref 3.5–4.6)
Alkaline Phosphatase: 84 IU/L (ref 48–121)
BUN/Creatinine Ratio: 18 (ref 12–28)
BUN: 23 mg/dL (ref 10–36)
Bilirubin Total: 0.4 mg/dL (ref 0.0–1.2)
CO2: 21 mmol/L (ref 20–29)
Calcium: 9.8 mg/dL (ref 8.7–10.3)
Chloride: 106 mmol/L (ref 96–106)
Creatinine, Ser: 1.29 mg/dL — ABNORMAL HIGH (ref 0.57–1.00)
GFR calc Af Amer: 42 mL/min/{1.73_m2} — ABNORMAL LOW (ref >59)
GFR calc non Af Amer: 36 mL/min/{1.73_m2} — ABNORMAL LOW (ref >59)
Globulin, Total: 2.8 g/dL (ref 1.5–4.5)
Glucose: 101 mg/dL — ABNORMAL HIGH (ref 65–99)
Potassium: 4.2 mmol/L (ref 3.5–5.2)
Sodium: 144 mmol/L (ref 134–144)
Total Protein: 6.6 g/dL (ref 6.0–8.5)

## 2019-08-08 LAB — CBC WITH DIFFERENTIAL/PLATELET
Basophils Absolute: 0.1 10*3/uL (ref 0.0–0.2)
Basos: 1 %
EOS (ABSOLUTE): 0.2 10*3/uL (ref 0.0–0.4)
Eos: 3 %
Hematocrit: 43.2 % (ref 34.0–46.6)
Hemoglobin: 14.5 g/dL (ref 11.1–15.9)
Immature Grans (Abs): 0 10*3/uL (ref 0.0–0.1)
Immature Granulocytes: 0 %
Lymphocytes Absolute: 2.4 10*3/uL (ref 0.7–3.1)
Lymphs: 38 %
MCH: 31.9 pg (ref 26.6–33.0)
MCHC: 33.6 g/dL (ref 31.5–35.7)
MCV: 95 fL (ref 79–97)
Monocytes Absolute: 0.5 10*3/uL (ref 0.1–0.9)
Monocytes: 8 %
Neutrophils Absolute: 3.1 10*3/uL (ref 1.4–7.0)
Neutrophils: 50 %
Platelets: 146 10*3/uL — ABNORMAL LOW (ref 150–450)
RBC: 4.55 x10E6/uL (ref 3.77–5.28)
RDW: 14.7 % (ref 11.7–15.4)
WBC: 6.2 10*3/uL (ref 3.4–10.8)

## 2019-08-08 LAB — LIPID PANEL
Chol/HDL Ratio: 2.6 ratio (ref 0.0–4.4)
Cholesterol, Total: 154 mg/dL (ref 100–199)
HDL: 59 mg/dL (ref >39)
LDL Chol Calc (NIH): 77 mg/dL (ref 0–99)
Triglycerides: 101 mg/dL (ref 0–149)
VLDL Cholesterol Cal: 18 mg/dL (ref 5–40)

## 2019-08-08 LAB — HEMOGLOBIN A1C
Est. average glucose Bld gHb Est-mCnc: 114 mg/dL
Hgb A1c MFr Bld: 5.6 % (ref 4.8–5.6)

## 2019-08-08 LAB — TSH: TSH: 2.79 u[IU]/mL (ref 0.450–4.500)

## 2019-08-13 ENCOUNTER — Other Ambulatory Visit: Payer: Self-pay | Admitting: Physician Assistant

## 2019-08-13 ENCOUNTER — Ambulatory Visit: Payer: Medicare HMO | Admitting: Family Medicine

## 2019-08-13 DIAGNOSIS — Z760 Encounter for issue of repeat prescription: Secondary | ICD-10-CM

## 2019-08-14 ENCOUNTER — Encounter: Payer: Self-pay | Admitting: Physician Assistant

## 2019-08-14 ENCOUNTER — Other Ambulatory Visit: Payer: Self-pay

## 2019-08-14 ENCOUNTER — Ambulatory Visit (INDEPENDENT_AMBULATORY_CARE_PROVIDER_SITE_OTHER): Payer: Medicare HMO | Admitting: Physician Assistant

## 2019-08-14 VITALS — BP 110/71 | HR 61 | Temp 98.1°F | Ht 65.5 in | Wt 157.6 lb

## 2019-08-14 DIAGNOSIS — Z760 Encounter for issue of repeat prescription: Secondary | ICD-10-CM | POA: Diagnosis not present

## 2019-08-14 DIAGNOSIS — R7989 Other specified abnormal findings of blood chemistry: Secondary | ICD-10-CM | POA: Diagnosis not present

## 2019-08-14 DIAGNOSIS — E039 Hypothyroidism, unspecified: Secondary | ICD-10-CM

## 2019-08-14 DIAGNOSIS — E785 Hyperlipidemia, unspecified: Secondary | ICD-10-CM

## 2019-08-14 DIAGNOSIS — N1832 Chronic kidney disease, stage 3b: Secondary | ICD-10-CM | POA: Diagnosis not present

## 2019-08-14 DIAGNOSIS — H6123 Impacted cerumen, bilateral: Secondary | ICD-10-CM | POA: Diagnosis not present

## 2019-08-14 DIAGNOSIS — I10 Essential (primary) hypertension: Secondary | ICD-10-CM

## 2019-08-14 DIAGNOSIS — R600 Localized edema: Secondary | ICD-10-CM

## 2019-08-14 MED ORDER — FUROSEMIDE 20 MG PO TABS
20.0000 mg | ORAL_TABLET | Freq: Every day | ORAL | 0 refills | Status: DC
Start: 2019-08-14 — End: 2019-09-13

## 2019-08-14 NOTE — Assessment & Plan Note (Signed)
-   Stable. - Most recent TSH wnl - Continue levothyroxine 25 mg 0.5 tablet and 50 mg for total dose of 62.5 mg. -Will continue to monitor.

## 2019-08-14 NOTE — Addendum Note (Signed)
Addended by: Mickel Crow on: 08/14/2019 02:24 PM   Modules accepted: Orders

## 2019-08-14 NOTE — Progress Notes (Signed)
Established Patient Office Visit  Subjective:  Patient ID: Donna Arias, female    DOB: [birth year]  Age: 86 y.o. MRN: [id number]  CC:  Chief Complaint  Patient presents with   Hypertension   Hyperlipidemia    HPI Donna Arias presents for follow-up on hypertension and hyperlipidemia.  Patient is accompanied by her daughter.  HTN: Pt denies chest pain, palpitations, dizziness or increased lower extremity swelling from baseline. Taking medication as directed without side effects.  Daughter checks BP at home and readings vary but on average remain <140/90.  Patient's daughter states BP might be a little elevated depending if she had moderate salt with a meal.  HLD: Pt taking medication as directed without issues. Denies side effects including myalgias and RUQ pain.   Decreased hearing: Reports has history of impacted cerumen and usually can tell when her ears are blocked due to decreased hearing.  Requesting ear irrigation if needed.  CKD: Continues to avoid NSAIDs such as Advil and Aleve. She does drink water but probably not enough. Daughter states patient has not needed to take Lyrica daily and last time patient took a dose was about 2 weeks ago.  Past Medical History:  Diagnosis Date   Cancer ([place])    squamous cell of left face   COPD (chronic obstructive pulmonary disease) (HCC)    Dementia (HCC)    Dementia (HCC)    GERD (gastroesophageal reflux disease)    Heart attack ([place]) 1998   Hypertension    Stroke ([place])    Thyroid disease     Past Surgical History:  Procedure Laterality Date   BREAST LUMPECTOMY  1995   Left Breast   DILATION AND CURETTAGE OF UTERUS  1975   [place]    Family History  Problem Relation Age of Onset   Hypertension Mother    Stroke Mother    Stroke Sister    Stroke Sister    Stroke Sister     Social History   Socioeconomic History   Marital status: Widowed    Spouse name: Not on file    Number of children: 1   Years of education: Not on file   Highest education level: Not on file  Occupational History   Occupation: RETIRED  Tobacco Use   Smoking status: Former Smoker   Smokeless tobacco: Never Used  Scientific laboratory technician Use: Never used  Substance and Sexual Activity   Alcohol use: No   Drug use: No   Sexual activity: Never    Birth control/protection: None  Other Topics Concern   Not on file  Social History Narrative   CB x 1   Lives at home with her daughter (since 02/05/2013)   Right handed   Regular exercise - NO   Social Determinants of Health   Financial Resource Strain:    Difficulty of Paying Living Expenses:   Food Insecurity:    Worried About Charity fundraiser in the Last Year:    Arboriculturist in the Last Year:   Transportation Needs:    Film/video editor (Medical):    Lack of Transportation (Non-Medical):   Physical Activity:    Days of Exercise per Week:    Minutes of Exercise per Session:   Stress:    Feeling of Stress :   Social Connections:    Frequency of Communication with Friends and Family:    Frequency of Social Gatherings with Friends and Family:  Attends Religious Services:    Active Member of Clubs or Organizations:    Attends Music therapist:    Marital Status:   Intimate Partner Violence:    Fear of Current or Ex-Partner:    Emotionally Abused:    Physically Abused:    Sexually Abused:     Outpatient Medications Prior to Visit  Medication Sig Dispense Refill   albuterol (PROVENTIL HFA;VENTOLIN HFA) 108 (90 Base) MCG/ACT inhaler Inhale 2 puffs into the lungs every 6 (six) hours as needed. 1 Inhaler 1   albuterol (PROVENTIL) (2.5 MG/3ML) 0.083% nebulizer solution Take 3 mLs (2.5 mg total) by nebulization every 6 (six) hours as needed for wheezing or shortness of breath. 75 mL 12   amLODipine (NORVASC) 2.5 MG tablet Take 1 tablet (2.5 mg total) by mouth daily. 60  tablet 0   aspirin 81 MG tablet Take 81 mg by mouth daily.     atorvastatin (LIPITOR) 10 MG tablet TAKE 1 TABLET BY MOUTH DAILY. 90 tablet 0   Baclofen 5 MG TABS Take 1 tablet by mouth at bedtime as needed. 30 tablet 0   benzonatate (TESSALON) 100 MG capsule Take 1 capsule (100 mg total) by mouth 2 (two) times daily as needed for cough. 10 capsule 0   Calcium Carbonate-Vit D-Min (CALCIUM 1200 PO) Take 1 tablet by mouth.     FLUoxetine (PROZAC) 10 MG capsule Take 1 capsule (10 mg total) by mouth daily. 90 capsule 2   fluticasone (FLONASE) 50 MCG/ACT nasal spray Place 1 spray into both nostrils 2 (two) times daily. 16 g 6   furosemide (LASIX) 20 MG tablet TAKE 1 TABLET BY MOUTH DAILY. 90 tablet 0   ketoconazole (NIZORAL) 2 % shampoo APPLY TO AFFECTED AREA AS DIRECTED 2 TIMES A WEEK. 120 mL 1   levothyroxine (SYNTHROID) 25 MCG tablet TAKE 1/2 TABLET BY MOUTH DAILY WITH 50 MCG TO EQUAL 62.5 MCG TOTAL DAILY 45 tablet 1   levothyroxine (SYNTHROID) 50 MCG tablet TAKE 1 TABLET BY MOUTH EVERY DAY 90 tablet 0   loratadine (CLARITIN) 10 MG tablet Take 1 tablet (10 mg total) by mouth daily. 90 tablet 1   potassium chloride SA (KLOR-CON) 20 MEQ tablet TAKE 1 TABLET BY MOUTH ONCE DAILY. OFFICE VISIT REQUIRED PRIOR TO ANY FURTHER REFILLS 30 tablet 0   pregabalin (LYRICA) 75 MG capsule Take 1 capsule (75 mg total) by mouth 2 (two) times daily. 60 capsule 1   risperiDONE (RISPERDAL) 1 MG tablet Take 1 tablet (1 mg total) by mouth 2 (two) times daily. 120 tablet 0   Spacer/Aero-Holding Chambers DEVI 1 Product by Does not apply route as needed. 1 each 1   STIOLTO RESPIMAT 2.5-2.5 MCG/ACT AERS INHALE 2 PUFFS INTO THE LUNGS DAILY. 4 g 1   triamcinolone cream (KENALOG) 0.1 % Apply 1 application topically 2 (two) times daily. Request jar 453 g 0   Facility-Administered Medications Prior to Visit  Medication Dose Route Frequency Provider Last Rate Last Admin   ipratropium-albuterol (DUONEB) 0.5-2.5  (3) MG/3ML nebulizer solution 3 mL  3 mL Nebulization Q6H Danford, Katy D, NP   3 mL at 12/08/17 5643    Allergies  Allergen Reactions   Codeine Sulfate     REACTION: unspecified   Picato [Ingenol Mebutate] Hives    ROS Review of Systems   A fourteen system review of systems was performed and found to be positive as per HPI.  Objective:    Physical Exam General:  Well Developed,  well nourished, appropriate for stated age.  Neuro:  Alert and oriented,  extra-ocular muscles intact  HEENT:  Normocephalic, atraumatic, lower eyelid erythema w/o lesions, B/L cerumen impaction Skin:  no gross rash, warm, pink. Cardiac:  RRR, S1 S2 Respiratory:  ECTA B/L, Not using accessory muscles, speaking in full sentences- unlabored. Vascular:  Ext warm, no cyanosis apprec.; cap RF less 2 sec. Mild gross edema present Psych:  No HI/SI, judgement and insight stable, Euthymic mood. Full Affect.   BP 110/71    Pulse 61    Temp 98.1 F (36.7 C) (Oral)    Ht 5' 5.5" (1.664 m)    Wt 157 lb 9.6 oz (71.5 kg)    SpO2 98%    BMI 25.83 kg/m  Wt Readings from Last 3 Encounters:  08/14/19 157 lb 9.6 oz (71.5 kg)  05/21/19 156 lb 6.4 oz (70.9 kg)  02/12/19 164 lb 9.6 oz (74.7 kg)     Health Maintenance Due  Topic Date Due   TETANUS/TDAP  02/25/2018    There are no preventive care reminders to display for this patient.  Lab Results  Component Value Date   TSH 2.790 08/07/2019   Lab Results  Component Value Date   WBC 6.2 08/07/2019   HGB 14.5 08/07/2019   HCT 43.2 08/07/2019   MCV 95 08/07/2019   PLT 146 (L) 08/07/2019   Lab Results  Component Value Date   NA 144 08/07/2019   K 4.2 08/07/2019   CO2 21 08/07/2019   GLUCOSE 101 (H) 08/07/2019   BUN 23 08/07/2019   CREATININE 1.29 (H) 08/07/2019   BILITOT 0.4 08/07/2019   ALKPHOS 84 08/07/2019   AST 18 08/07/2019   ALT 11 08/07/2019   PROT 6.6 08/07/2019   ALBUMIN 3.8 08/07/2019   CALCIUM 9.8 08/07/2019   GFR 45.15 (L) 08/01/2015    Lab Results  Component Value Date   CHOL 154 08/07/2019   Lab Results  Component Value Date   HDL 59 08/07/2019   Lab Results  Component Value Date   LDLCALC 77 08/07/2019   Lab Results  Component Value Date   TRIG 101 08/07/2019   Lab Results  Component Value Date   CHOLHDL 2.6 08/07/2019   Lab Results  Component Value Date   HGBA1C 5.6 08/07/2019      Assessment & Plan:   Problem List Items Addressed This Visit      Cardiovascular and Mediastinum   Essential hypertension - Primary    - BP today is 110/71 HR 61, stable. - Continue Amlodipine 2.5 mg - Continue ambulatory BP and pulse monitoring and keep a log. - Continue DASH diet. - Increase water consumption, at least 64 fl oz - Encourage to stay as active as possible.         Endocrine   Hypothyroidism    - Stable. - Most recent TSH wnl - Continue levothyroxine 25 mg 0.5 tablet and 50 mg for total dose of 62.5 mg. -Will continue to monitor.        Other   Hyperlipidemia    - Most recent lipid panel wnl's. - Continue Atorvastatin 10 mg - Follow heart healthy diet. - Will continue to monitor.        Other Visit Diagnoses    Stage 3b chronic kidney disease       Relevant Orders   Comp Met (CMET)   Abnormal CBC       Relevant Orders   CBC   Bilateral  impacted cerumen       Lower extremity edema         CKD: -Most recent CMP: Creatinine 1.29, GFR 36 decline in kidney function -Continue to avoid nephrotoxic substances and increase water hydration. -Patient has not taken Lyrica on a daily basis so less likely the cause for renal decline. -Will recheck renal function in 4 weeks. Discussed with patient and daughter nephrology referral if renal function continues to decline. Both verbalized understanding.  Lower extremity edema: -Stable. -Continue furosemide and potassium chloride. -Most recent CMP: Electrolytes within normal limits -Recommend elevation and compression socks.  Abnormal  CBC: -Platelets mildly low, previously WNL. -Will recheck CBC in 4 weeks.   Indication: Cerumen impaction of both ears Medical necessity statement:  On physical examination, cerumen impairs clinically significant portions of the external auditory canal, and tympanic membrane.  Consent:  Discussed benefits and risks of procedure and verbal consent obtained Procedure:   Patient was prepped for the procedure.  Utilized an otoscope to assess and take note of the ear canal, the tympanic membrane, and the presence, amount, and placement of the cerumen. Gentle water irrigation and soft plastic curette was utilized to remove cerumen. Post procedure examination:  shows cerumen was removed, without trauma or injury to the ear canal or TM, which remains intact.   Post-Procedural Ear Care Instructions:    Patient tolerated procedure well.  Proper ear care d/c pt.   The patient is made aware that they may experience temporary vertigo, temporary hearing loss, and temporary discomfort.  If these symptom last for more than 24 hours to call the clinic or proceed to the ED/Urgent Care.   No orders of the defined types were placed in this encounter.   Follow-up: Return in about 6 months (around 02/14/2020) for HTN, HLD, CKD, hypothyroid and FBW (lipid panel, cmp); lab visit in 4 weeks to recheck CMP, CBC.   Note:  This note was prepared with assistance of Dragon voice recognition software. Occasional wrong-word or sound-a-like substitutions may have occurred due to the inherent limitations of voice recognition software.   Lorrene Reid, PA-C

## 2019-08-14 NOTE — Assessment & Plan Note (Signed)
-   Most recent lipid panel wnl's. - Continue Atorvastatin 10 mg - Follow heart healthy diet. - Will continue to monitor.

## 2019-08-14 NOTE — Patient Instructions (Signed)
Chronic Kidney Disease, Adult Chronic kidney disease (CKD) occurs when the kidneys become damaged slowly over a long period of time. The kidneys are a pair of organs that do many important jobs in the body, including:  Removing waste and extra fluid from the blood to make urine.  Making hormones that maintain the amount of fluid in tissues and blood vessels.  Maintaining the right amount of fluids and chemicals in the body. A small amount of kidney damage may not cause problems, but a large amount of damage may make it hard or impossible for the kidneys to work the way they should. If steps are not taken to slow down kidney damage or to stop it from getting worse, the kidneys may stop working permanently (end-stage renal disease or ESRD). Most of the time, CKD does not go away, but it can often be controlled. People who have CKD are usually able to live normal lives. What are the causes? The most common causes of this condition are diabetes and high blood pressure (hypertension). Other causes include:  Heart and blood vessel (cardiovascular) disease.  Kidney diseases, such as: ? Glomerulonephritis. ? Interstitial nephritis. ? Polycystic kidney disease. ? Renal vascular disease.  Diseases that affect the immune system.  Genetic diseases.  Medicines that damage the kidneys, such as anti-inflammatory medicines.  Being around or being in contact with poisonous (toxic) substances.  A kidney or urinary infection that occurs again and again (recurs).  Vasculitis. This is swelling or inflammation of the blood vessels.  A problem with urine flow that may be caused by: ? Cancer. ? Having kidney stones more than one time. ? An enlarged prostate, in males. What increases the risk? You are more likely to develop this condition if you:  Are older than age 60.  Are female.  Are African-American, Hispanic, Asian, Pacific Islander, or American Indian.  Are a current or former smoker.   Are obese.  Have a family history of kidney disease or failure.  Often take medicines that are damaging to the kidneys. What are the signs or symptoms? Symptoms of this condition include:  Swelling (edema) of the face, legs, ankles, or feet.  Tiredness (lethargy) and having less energy.  Nausea or vomiting.  Confusion or trouble concentrating.  Problems with urination, such as: ? Painful or burning feeling during urination. ? Decreased urine production. ? Frequent urination, especially at night. ? Bloody urine.  Muscle twitches and cramps, especially in the legs.  Shortness of breath.  Weakness.  Loss of appetite.  Metallic taste in the mouth.  Trouble sleeping.  Dry, itchy skin.  A low blood count (anemia).  Pale lining of the eyelids and surface of the eye (conjunctiva). Symptoms develop slowly and may not be obvious until the kidney damage becomes severe. It is possible to have kidney disease for years without having any symptoms. How is this diagnosed? This condition may be diagnosed based on:  Blood tests.  Urine tests.  Imaging tests, such as an ultrasound or CT scan.  A test in which a sample of tissue is removed from the kidneys to be examined under a microscope (kidney biopsy). These test results will help your health care provider determine how serious the CKD is. How is this treated? There is no cure for most cases of this condition, but treatment usually relieves symptoms and prevents or slows the progression of the disease. Treatment may include:  Making diet changes, which may require you to avoid alcohol, salty foods (sodium),   and foods that are high in potassium, calcium, and protein.  Medicines: ? To lower blood pressure. ? To control blood glucose. ? To relieve anemia. ? To relieve swelling. ? To protect your bones. ? To improve the balance of electrolytes in your blood.  Removing toxic waste from the body through types of dialysis, if  the kidneys can no longer do their job (kidney failure).  Managing any other conditions that are causing your CKD or making it worse. Follow these instructions at home: Medicines  Take over-the-counter and prescription medicines only as told by your health care provider. The dose of some medicines that you take may need to be adjusted.  Do not take any new medicines unless approved by your health care provider. Many medicines can worsen your kidney damage.  Do not take any vitamin and mineral supplements unless approved by your health care provider. Many nutritional supplements can worsen your kidney damage. General instructions  Follow your prescribed diet as told by your health care provider.  Do not use any products that contain nicotine or tobacco, such as cigarettes and e-cigarettes. If you need help quitting, ask your health care provider.  Monitor and track your blood pressure at home. Report changes in your blood pressure as told by your health care provider.  If you are being treated for diabetes, monitor and track your blood sugar (blood glucose) levels as told by your health care provider.  Maintain a healthy weight. If you need help with this, ask your health care provider.  Start or continue an exercise plan. Exercise at least 30 minutes a day, 5 days a week.  Keep your immunizations up to date as told by your health care provider.  Keep all follow-up visits as told by your health care provider. This is important. Where to find more information  American Association of Kidney Patients: www.aakp.org  National Kidney Foundation: www.kidney.org  American Kidney Fund: www.akfinc.org  Life [facility]: www.lifeoptions.org and www.kidneyschool.org Contact a health care provider if:  Your symptoms get worse.  You develop new symptoms. Get help right away if:  You develop symptoms of ESRD, which include: ? Headaches. ? Numbness in the hands or  feet. ? Easy bruising. ? Frequent hiccups. ? Chest pain. ? Shortness of breath. ? Lack of menstruation, in women.  You have a fever.  You have decreased urine production.  You have pain or bleeding when you urinate. Summary  Chronic kidney disease (CKD) occurs when the kidneys become damaged slowly over a long period of time.  The most common causes of this condition are diabetes and high blood pressure (hypertension).  There is no cure for most cases of this condition, but treatment usually relieves symptoms and prevents or slows the progression of the disease. Treatment may include a combination of medicines and lifestyle changes. This information is not intended to replace advice given to you by your health care provider. Make sure you discuss any questions you have with your health care provider. Document Revised: 12/24/2016 Document Reviewed: 02/19/2016 Elsevier Patient Education  2020 Elsevier Inc.  

## 2019-08-14 NOTE — Assessment & Plan Note (Addendum)
-   BP today is 110/71 HR 61, stable. - Continue Amlodipine 2.5 mg - Continue ambulatory BP and pulse monitoring and keep a log. - Continue DASH diet. - Increase water consumption, at least 64 fl oz - Encourage to stay as active as possible.

## 2019-08-27 ENCOUNTER — Telehealth: Payer: Self-pay | Admitting: Physician Assistant

## 2019-08-27 NOTE — Telephone Encounter (Signed)
Spoke with Donna Arias who advised patient could try to use dermacloud, vasaline or neosporin. If not better with these then patient to schedule apt.   Spoke with [name] and advised her of the above. She verbalized understanding. AS, CMA

## 2019-08-27 NOTE — Telephone Encounter (Signed)
Pt's daughter/ Tia Alert cld states she notice while bathing patient an area of skin peeling & brokeness in bend/crease of thigh & groin area she had been using a Zinc ointment.[name] daughter request med asst call her back to discuss & any recommendations@ [phone].  --glh

## 2019-08-28 ENCOUNTER — Other Ambulatory Visit: Payer: Self-pay | Admitting: Physician Assistant

## 2019-09-03 ENCOUNTER — Other Ambulatory Visit: Payer: Self-pay | Admitting: Physician Assistant

## 2019-09-03 DIAGNOSIS — Z760 Encounter for issue of repeat prescription: Secondary | ICD-10-CM

## 2019-09-11 ENCOUNTER — Other Ambulatory Visit: Payer: Self-pay | Admitting: Family Medicine

## 2019-09-12 ENCOUNTER — Telehealth: Payer: Self-pay | Admitting: Physician Assistant

## 2019-09-12 ENCOUNTER — Other Ambulatory Visit: Payer: Medicare HMO

## 2019-09-12 ENCOUNTER — Other Ambulatory Visit: Payer: Self-pay

## 2019-09-12 DIAGNOSIS — R7989 Other specified abnormal findings of blood chemistry: Secondary | ICD-10-CM

## 2019-09-12 DIAGNOSIS — N1832 Chronic kidney disease, stage 3b: Secondary | ICD-10-CM

## 2019-09-12 DIAGNOSIS — H6123 Impacted cerumen, bilateral: Secondary | ICD-10-CM

## 2019-09-12 DIAGNOSIS — D229 Melanocytic nevi, unspecified: Secondary | ICD-10-CM

## 2019-09-12 NOTE — Telephone Encounter (Signed)
Patient requesting derm referral for yearly skin checks and some spot she in concerned about. Please place referral in appropriate

## 2019-09-12 NOTE — Telephone Encounter (Signed)
Referral to derm has been placed. AS, CMA

## 2019-09-12 NOTE — Addendum Note (Signed)
Addended by: Mickel Crow on: 09/12/2019 09:22 AM   Modules accepted: Orders

## 2019-09-13 ENCOUNTER — Other Ambulatory Visit: Payer: Self-pay | Admitting: Physician Assistant

## 2019-09-13 ENCOUNTER — Telehealth: Payer: Self-pay | Admitting: Physician Assistant

## 2019-09-13 DIAGNOSIS — Z760 Encounter for issue of repeat prescription: Secondary | ICD-10-CM

## 2019-09-13 LAB — COMPREHENSIVE METABOLIC PANEL
ALT: 15 IU/L (ref 0–32)
AST: 19 IU/L (ref 0–40)
Albumin/Globulin Ratio: 1.6 (ref 1.2–2.2)
Albumin: 3.9 g/dL (ref 3.5–4.6)
Alkaline Phosphatase: 91 IU/L (ref 48–121)
BUN/Creatinine Ratio: 14 (ref 12–28)
BUN: 18 mg/dL (ref 10–36)
Bilirubin Total: 0.4 mg/dL (ref 0.0–1.2)
CO2: 25 mmol/L (ref 20–29)
Calcium: 9.9 mg/dL (ref 8.7–10.3)
Chloride: 105 mmol/L (ref 96–106)
Creatinine, Ser: 1.25 mg/dL — ABNORMAL HIGH (ref 0.57–1.00)
GFR calc Af Amer: 43 mL/min/{1.73_m2} — ABNORMAL LOW (ref >59)
GFR calc non Af Amer: 38 mL/min/{1.73_m2} — ABNORMAL LOW (ref >59)
Globulin, Total: 2.5 g/dL (ref 1.5–4.5)
Glucose: 89 mg/dL (ref 65–99)
Potassium: 3.8 mmol/L (ref 3.5–5.2)
Sodium: 143 mmol/L (ref 134–144)
Total Protein: 6.4 g/dL (ref 6.0–8.5)

## 2019-09-13 LAB — CBC
Hematocrit: 41.2 % (ref 34.0–46.6)
Hemoglobin: 13.7 g/dL (ref 11.1–15.9)
MCH: 31.4 pg (ref 26.6–33.0)
MCHC: 33.3 g/dL (ref 31.5–35.7)
MCV: 94 fL (ref 79–97)
Platelets: 156 10*3/uL (ref 150–450)
RBC: 4.37 x10E6/uL (ref 3.77–5.28)
RDW: 14 % (ref 11.7–15.4)
WBC: 7.1 10*3/uL (ref 3.4–10.8)

## 2019-09-13 MED ORDER — FUROSEMIDE 20 MG PO TABS: 20.0000 mg | ORAL_TABLET | Freq: Every day | ORAL | 0 refills | Status: DC

## 2019-09-13 MED ORDER — STIOLTO RESPIMAT 2.5-2.5 MCG/ACT IN AERS: INHALATION_SPRAY | RESPIRATORY_TRACT | 1 refills | Status: DC

## 2019-09-13 MED ORDER — LEVOTHYROXINE SODIUM 25 MCG PO TABS: ORAL_TABLET | ORAL | 0 refills | Status: DC

## 2019-09-13 MED ORDER — AMLODIPINE BESYLATE 2.5 MG PO TABS: 2.5000 mg | ORAL_TABLET | Freq: Every day | ORAL | 0 refills | Status: DC

## 2019-09-13 NOTE — Telephone Encounter (Signed)
Refills sent to pharmacy. AS, CMA 

## 2019-09-13 NOTE — Telephone Encounter (Signed)
Donna Arias, patient's daughter (DPR on file), is requesting a call back to discuss some med refills that she unsure if her mom needs and also some requests they put in last week but the pharm has no history of receiving. She can be reached at [phone]

## 2019-09-13 NOTE — Addendum Note (Signed)
Addended by: Mickel Crow on: 09/13/2019 10:20 AM   Modules accepted: Orders

## 2019-10-02 ENCOUNTER — Telehealth: Payer: Self-pay | Admitting: Physician Assistant

## 2019-10-02 DIAGNOSIS — E039 Hypothyroidism, unspecified: Secondary | ICD-10-CM

## 2019-10-02 MED ORDER — LEVOTHYROXINE SODIUM 50 MCG PO TABS: ORAL_TABLET | ORAL | 0 refills | Status: DC

## 2019-10-02 NOTE — Telephone Encounter (Signed)
Refill sent to requested pharmacy. AS, CMA 

## 2019-10-02 NOTE — Addendum Note (Signed)
Addended by: Mickel Crow on: 10/02/2019 11:02 AM   Modules accepted: Orders

## 2019-10-02 NOTE — Telephone Encounter (Signed)
Patient is requesting a refill of her 50 MCG thyroid med, if approved please send to [facility]

## 2019-10-08 ENCOUNTER — Telehealth: Payer: Self-pay | Admitting: Physician Assistant

## 2019-10-08 ENCOUNTER — Other Ambulatory Visit: Payer: Self-pay | Admitting: Adult Health

## 2019-10-08 MED ORDER — KETOCONAZOLE 2 % EX SHAM: MEDICATED_SHAMPOO | CUTANEOUS | 1 refills | Status: DC

## 2019-10-08 NOTE — Telephone Encounter (Signed)
Refill sent to requested pharmacy. AS, CMA 

## 2019-10-08 NOTE — Addendum Note (Signed)
Addended by: Mickel Crow on: 10/08/2019 01:59 PM   Modules accepted: Orders

## 2019-10-08 NOTE — Telephone Encounter (Signed)
Patient is requesting a refill of her ketoconazole shampoo, if approved please send to [facility]

## 2019-11-12 ENCOUNTER — Encounter: Payer: Self-pay | Admitting: Physician Assistant

## 2019-11-12 ENCOUNTER — Ambulatory Visit (INDEPENDENT_AMBULATORY_CARE_PROVIDER_SITE_OTHER): Payer: Medicare HMO | Admitting: Physician Assistant

## 2019-11-12 ENCOUNTER — Other Ambulatory Visit: Payer: Self-pay

## 2019-11-12 VITALS — BP 130/65 | HR 45 | Ht 65.5 in | Wt 161.0 lb

## 2019-11-12 DIAGNOSIS — J40 Bronchitis, not specified as acute or chronic: Secondary | ICD-10-CM | POA: Diagnosis not present

## 2019-11-12 DIAGNOSIS — Z760 Encounter for issue of repeat prescription: Secondary | ICD-10-CM | POA: Diagnosis not present

## 2019-11-12 MED ORDER — BENZONATATE 100 MG PO CAPS: 100.0000 mg | ORAL_CAPSULE | Freq: Three times a day (TID) | ORAL | 0 refills | Status: DC | PRN

## 2019-11-12 MED ORDER — ALBUTEROL SULFATE (2.5 MG/3ML) 0.083% IN NEBU: 2.5000 mg | INHALATION_SOLUTION | Freq: Four times a day (QID) | RESPIRATORY_TRACT | 6 refills | Status: AC | PRN

## 2019-11-12 MED ORDER — FUROSEMIDE 20 MG PO TABS: 20.0000 mg | ORAL_TABLET | Freq: Every day | ORAL | 0 refills | Status: DC

## 2019-11-12 NOTE — Progress Notes (Signed)
Telehealth office visit note for Donna Reid, PA-C- at [facility] at [facility]   I connected with current patient today by telephone and verified that I am speaking with the correct person   . Location of the patient: Home . Location of the provider: Office - This visit type was conducted due to national recommendations for restrictions regarding the COVID-19 Pandemic (e.g. social distancing) in an effort to limit this patient's exposure and mitigate transmission in our community.    - No physical exam could be performed with this format, beyond that communicated to [name] by the patient/ family members as noted.   - Additionally my office staff/ schedulers were to discuss with the patient that there may be a monetary charge related to this service, depending on their medical insurance.  My understanding is that patient understood and consented to proceed.     _________________________________________________________________________________   History of Present Illness: Patient calls in with complaints of dry cough, runny nose, and wheezing at night.  Patient's daughter (caregiver) is also on the line.  Patient's symptoms started Friday, 3 days ago.  Patient denies fever, chills, headache or shortness of breath.  Patient's daughter reports Friday night she noticed patient had some shortness of breath but none since then.  In the past patient has tried [employer] which helped with cough. Requesting refill of albuterol for nebulizer. Patient has been using Flonase spray, humidifier, and drinking warm tea with honey. Patient is fully vaccinated against Covid 19. Patient recently had a rapid Covid test done at the pharmacy which was negative. Patient's daughter states her grandbaby was at the house last week and was sick with a viral infection.     GAD 7 : Generalized Anxiety Score 08/23/2016  Nervous, Anxious, on Edge 0  Control/stop worrying 0  Worry too much - different things 0   Trouble relaxing 0  Restless 0  Easily annoyed or irritable 0  Afraid - awful might happen 0  Total GAD 7 Score 0  Anxiety Difficulty Not difficult at all    Depression screen [facility] 2/9 08/14/2019 05/21/2019 02/12/2019 11/06/2018 01/16/2018  Decreased Interest 1 0 0 0 0  Down, Depressed, Hopeless 0 0 0 0 0  PHQ - 2 Score 1 0 0 0 0  Altered sleeping 1 0 0 0 0  Tired, decreased energy 0 1 1 0 1  Change in appetite 0 0 0 0 0  Feeling bad or failure about yourself  0 0 0 0 0  Trouble concentrating 0 0 0 0 0  Moving slowly or fidgety/restless 0 0 0 0 0  Suicidal thoughts 0 0 0 0 0  PHQ-9 Score 2 1 1  0 1  Difficult doing work/chores Not difficult at all Not difficult at all Not difficult at all - Not difficult at all  Some recent data might be hidden      Impression and Recommendations:     1. Bronchitis with wheezing   2. Medication refill     Bronchitis with wheezing: -Recommend to continue with home supportive therapy and will provide refill of albuterol and send prescription for Tessalon Perles to use as needed for cough.  Patient currently has no fever or significant dyspnea so will hold off on starting antibiotic or systemic corticosteroids. -Advised to monitor for worsening symptoms such as shortness of breath, altered mental status, or fever. -If symptoms fail to improve or worsen advised to let me know and will start antibiotic  and prednisone taper.      - As part of my medical decision making, I reviewed the following data within the [place] History obtained from pt /family, CMA notes reviewed and incorporated if applicable, Labs reviewed, Radiograph/ tests reviewed if applicable and OV notes from prior OV's with me, as well as any other specialists she/he has seen since seeing me last, were all reviewed and used in my medical decision making process today.    - Additionally, when appropriate, discussion had with patient regarding our treatment plan, and  their biases/concerns about that plan were used in my medical decision making today.    - The patient agreed with the plan and demonstrated an understanding of the instructions.  No barriers to understanding were identified.     - The patient was advised to call back or seek an in-person evaluation if the symptoms worsen or if the condition fails to improve as anticipated.   No follow-ups on file.    No orders of the defined types were placed in this encounter.   Meds ordered this encounter  Medications  . albuterol (PROVENTIL) (2.5 MG/3ML) 0.083% nebulizer solution    Sig: Take 3 mLs (2.5 mg total) by nebulization every 6 (six) hours as needed for wheezing or shortness of breath.    Dispense:  75 mL    Refill:  6  . benzonatate (TESSALON) 100 MG capsule    Sig: Take 1 capsule (100 mg total) by mouth 3 (three) times daily as needed for cough.    Dispense:  30 capsule    Refill:  0  . furosemide (LASIX) 20 MG tablet    Sig: Take 1 tablet (20 mg total) by mouth daily.    Dispense:  90 tablet    Refill:  0    Medications Discontinued During This Encounter  Medication Reason  . albuterol (PROVENTIL) (2.5 MG/3ML) 0.083% nebulizer solution Reorder  . benzonatate (TESSALON) 100 MG capsule Reorder  . furosemide (LASIX) 20 MG tablet Reorder       Time spent on visit including pre-visit chart review and post-visit care was 10 minutes.      The [place] was signed into law in 2016 which includes the topic of electronic health records.  This provides immediate access to information in MyChart.  This includes consultation notes, operative notes, office notes, lab results and pathology reports.  If you have any questions about what you read please let us know at your next visit or call us at the office.  We are right here with you.  Note:  This note was prepared with assistance of Dragon voice recognition software. Occasional wrong-word or sound-a-like substitutions may  have occurred due to the inherent limitations of voice recognition software.  __________________________________________________________________________________     Patient Care Team    Relationship Specialty Notifications Start End  Donna Arias, [place] PCP - General Physician Assistant  06/04/19      -Vitals obtained; medications/ allergies reconciled;  personal medical, social, Sx etc.histories were updated by CMA, reviewed by me and are reflected in chart   Patient Active Problem List   Diagnosis Date Noted  . Exudative age-related macular degeneration of left eye with inactive choroidal neovascularization ([place]) 06/04/2019  . Posterior vitreous detachment of right eye 06/04/2019  . 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 Advanced nonexudative age-related macular degeneration of left eye with subfoveal involvement 06/04/2019  . Intermediate stage nonexudative age-related macular degeneration of right  eye 06/04/2019  . Excessive cerumen in ear canal, bilateral 11/06/2018  . Eye infection, left 01/16/2018  . Ear fullness, left 01/16/2018  . Multiple atypical nevi 06/23/2017  . Ankle edema, bilateral 05/02/2017  . Postnasal drip 12/13/2016  . Acute bronchitis with COPD ([place]) 12/13/2016  . Orthostatic hypotension 11/22/2016  . Hyperlipidemia 11/22/2016  . Anxiety 11/22/2016  . Dysarthria 11/22/2016  . Weight gain 11/18/2016  . Edema of right foot 11/18/2016  . Age-related osteoporosis without current pathological fracture 11/16/2016  . Cough in adult 11/16/2016  . Healthcare maintenance 10/14/2016  . Diaper dermatitis 06/10/2016  . Antibiotic-induced yeast infection 06/10/2016  . Hearing loss secondary to cerumen impaction, bilateral 06/10/2016  . Chronic bilateral low back pain 04/19/2016  . Chronic neck pain 04/19/2016  . Medication refill 04/14/2016  . Joint laxity of right knee 04/14/2016  . Hypothyroidism 02/04/2016  . Upper back pain 08/01/2009  .  Transient cerebral ischemia 09/22/2007  . Dementia with behavioral disturbance ([place]) 10/07/2006  . Essential hypertension 10/07/2006  . COPD (chronic obstructive pulmonary disease) ([place]) 10/07/2006  . GERD 10/07/2006     Current Meds  Medication Sig  . albuterol (PROVENTIL) (2.5 MG/3ML) 0.083% nebulizer solution Take 3 mLs (2.5 mg total) by nebulization every 6 (six) hours as needed for wheezing or shortness of breath.  [name] amLODipine (NORVASC) 2.5 MG tablet Take 1 tablet (2.5 mg total) by mouth daily.  [name] aspirin 81 MG tablet Take 81 mg by mouth daily.  [name] atorvastatin (LIPITOR) 10 MG tablet TAKE 1 TABLET BY MOUTH DAILY.  . Baclofen 5 MG TABS Take 1 tablet by mouth at bedtime as needed.  . Calcium Carbonate-Vit D-Min (CALCIUM 1200 PO) Take 1 tablet by mouth.  [name] FLUoxetine (PROZAC) 10 MG capsule TAKE 1 CAPSULE BY MOUTH DAILY.  . fluticasone (FLONASE) 50 MCG/ACT nasal spray Place 1 spray into both nostrils 2 (two) times daily.  . furosemide (LASIX) 20 MG tablet Take 1 tablet (20 mg total) by mouth daily.  [name] ketoconazole (NIZORAL) 2 % shampoo Use as directed  . levothyroxine (SYNTHROID) 25 MCG tablet TAKE 1/2 TABLET BY MOUTH DAILY WITH 50 MCG TO EQUAL 62.5 MCG TOTAL DAILY  . levothyroxine (SYNTHROID) 50 MCG tablet TAKE 1 TABLET BY MOUTH EVERY DAY  . loratadine (CLARITIN) 10 MG tablet Take 1 tablet (10 mg total) by mouth daily.  . potassium chloride SA (KLOR-CON) 20 MEQ tablet TAKE 1 TABLET BY MOUTH ONCE DAILY.  [name] risperiDONE (RISPERDAL) 1 MG tablet Take 1 tablet (1 mg total) by mouth 2 (two) times daily.  . Tiotropium Bromide-Olodaterol (STIOLTO RESPIMAT) 2.5-2.5 MCG/ACT AERS INHALE 2 PUFFS INTO THE LUNGS DAILY  . triamcinolone cream (KENALOG) 0.1 % Apply 1 application topically 2 (two) times daily. Request jar  . [DISCONTINUED] albuterol (PROVENTIL) (2.5 MG/3ML) 0.083% nebulizer solution Take 3 mLs (2.5 mg total) by nebulization every 6 (six) hours as needed for wheezing or shortness of breath.   . [DISCONTINUED] furosemide (LASIX) 20 MG tablet Take 1 tablet (20 mg total) by mouth daily.   Current Facility-Administered Medications for the 11/12/19 encounter (Office Visit) with Donna Reid, PA-C  Medication  . ipratropium-albuterol (DUONEB) 0.5-2.5 (3) MG/3ML nebulizer solution 3 mL     Allergies:  Allergies  Allergen Reactions  . Codeine Sulfate     REACTION: unspecified  . Picato [Ingenol Mebutate] Hives     ROS:  See above HPI for pertinent positives and negatives   Objective:   Blood pressure 130/65, pulse (!) 45, height  5' 5.5" (1.664 m), weight 161 lb (73 kg).  (if some vitals are omitted, this means that patient was UNABLE to obtain them even though they were asked to get them prior to OV today.  They were asked to call us at their earliest convenience with these once obtained. ) General: A & O * 3; sounds in no acute distress Respiratory: speaking in full sentences, no conversational dyspnea Psych: insight appears good, mood- appears full

## 2019-11-14 ENCOUNTER — Telehealth: Payer: Self-pay | Admitting: Physician Assistant

## 2019-11-14 DIAGNOSIS — J40 Bronchitis, not specified as acute or chronic: Secondary | ICD-10-CM

## 2019-11-14 MED ORDER — PREDNISONE 20 MG PO TABS: ORAL_TABLET | ORAL | 0 refills | Status: DC

## 2019-11-14 MED ORDER — DOXYCYCLINE HYCLATE 100 MG PO TABS: 100.0000 mg | ORAL_TABLET | Freq: Two times a day (BID) | ORAL | 0 refills | Status: DC

## 2019-11-14 NOTE — Telephone Encounter (Signed)
Patient had acute sick visit earlier this week and daughter just called saying that if she did nt not get any better in a few days we would send in prednisone for patient. They want to move ahead with this med, if approved please send to [facility].

## 2019-11-14 NOTE — Telephone Encounter (Signed)
Please review.  Donna Arias, CMA

## 2019-11-15 NOTE — Telephone Encounter (Signed)
11/15/2019  LVM informing pt.  Charyl Bigger, CMA

## 2019-11-16 ENCOUNTER — Ambulatory Visit: Payer: Medicare HMO

## 2019-11-20 ENCOUNTER — Other Ambulatory Visit: Payer: Self-pay | Admitting: Physician Assistant

## 2019-11-20 DIAGNOSIS — Z760 Encounter for issue of repeat prescription: Secondary | ICD-10-CM

## 2019-11-20 MED ORDER — RISPERIDONE 1 MG PO TABS: 1.0000 mg | ORAL_TABLET | Freq: Two times a day (BID) | ORAL | 0 refills | Status: DC

## 2019-11-20 NOTE — Addendum Note (Signed)
Addended by: Fonnie Mu on: 11/20/2019 10:50 AM   Modules accepted: Orders

## 2019-11-20 NOTE — Telephone Encounter (Signed)
Patient's daughter called in requesting a refill on Risperidone. Please send to [facility]. Thanks

## 2019-11-21 ENCOUNTER — Other Ambulatory Visit: Payer: Self-pay | Admitting: Physician Assistant

## 2019-11-30 ENCOUNTER — Other Ambulatory Visit: Payer: Self-pay

## 2019-11-30 ENCOUNTER — Ambulatory Visit: Payer: Medicare HMO

## 2019-11-30 DIAGNOSIS — Z23 Encounter for immunization: Secondary | ICD-10-CM

## 2019-11-30 MED ORDER — SHINGRIX 50 MCG/0.5ML IM SUSR: 0.5000 mL | Freq: Once | INTRAMUSCULAR | 0 refills | Status: AC

## 2019-12-03 ENCOUNTER — Other Ambulatory Visit: Payer: Self-pay | Admitting: Physician Assistant

## 2019-12-03 ENCOUNTER — Encounter (INDEPENDENT_AMBULATORY_CARE_PROVIDER_SITE_OTHER): Payer: Self-pay | Admitting: Ophthalmology

## 2019-12-03 ENCOUNTER — Other Ambulatory Visit: Payer: Self-pay

## 2019-12-03 ENCOUNTER — Ambulatory Visit (INDEPENDENT_AMBULATORY_CARE_PROVIDER_SITE_OTHER): Payer: Medicare HMO | Admitting: Ophthalmology

## 2019-12-03 DIAGNOSIS — H353222 Exudative age-related macular degeneration, left eye, with inactive choroidal neovascularization: Secondary | ICD-10-CM | POA: Diagnosis not present

## 2019-12-03 DIAGNOSIS — Z760 Encounter for issue of repeat prescription: Secondary | ICD-10-CM

## 2019-12-03 DIAGNOSIS — H353211 Exudative age-related macular degeneration, right eye, with active choroidal neovascularization: Secondary | ICD-10-CM | POA: Diagnosis not present

## 2019-12-03 DIAGNOSIS — H353124 Nonexudative age-related macular degeneration, left eye, advanced atrophic with subfoveal involvement: Secondary | ICD-10-CM | POA: Diagnosis not present

## 2019-12-03 MED ORDER — BEVACIZUMAB CHEMO INJECTION 1.25MG/0.05ML SYRINGE FOR KALEIDOSCOPE
1.2500 mg | INTRAVITREAL | Status: AC | PRN
  Administered 2019-12-03: 1.25 mg via INTRAVITREAL

## 2019-12-03 NOTE — Assessment & Plan Note (Signed)
No change, diffuse retinal atrophy, accounts for acuity

## 2019-12-03 NOTE — Assessment & Plan Note (Signed)
New subretinal fluid in the fovea as well as intraretinal fluid, type III CNVM, will commence therapy with antivegF, Avastin intravitreal OD today

## 2019-12-03 NOTE — Progress Notes (Signed)
12/03/2019     CHIEF COMPLAINT Patient presents for Retina Follow Up   HISTORY OF PRESENT ILLNESS: Donna Arias is a 86 y.o. female who presents to the clinic today for:   HPI    Retina Follow Up    Patient presents with  Dry AMD.  In left eye.  Severity is moderate.  Duration of 6 months.  Since onset it is stable.  I, the attending physician,  performed the HPI with the patient and updated documentation appropriately.          Comments    6 Month Dry AMD f\u OU. OCT  Pt states vision has been the same. Pt c/o OS becoming occasionally red.       Last edited by Tilda Franco on 12/03/2019 10:47 AM. (History)      Referring physician: Mellody Dance, DO [place] [address] [place],  [place] [address]  HISTORICAL INFORMATION:   Selected notes from the MEDICAL RECORD NUMBER    Lab Results  Component Value Date   HGBA1C 5.6 08/07/2019     CURRENT MEDICATIONS: No current outpatient medications on file. (Ophthalmic Drugs)   No current facility-administered medications for this visit. (Ophthalmic Drugs)   Current Outpatient Medications (Other)  Medication Sig  . albuterol (PROVENTIL HFA;VENTOLIN HFA) 108 (90 Base) MCG/ACT inhaler Inhale 2 puffs into the lungs every 6 (six) hours as needed. (Patient not taking: Reported on 11/12/2019)  . albuterol (PROVENTIL) (2.5 MG/3ML) 0.083% nebulizer solution Take 3 mLs (2.5 mg total) by nebulization every 6 (six) hours as needed for wheezing or shortness of breath.  [name] amLODipine (NORVASC) 2.5 MG tablet Take 1 tablet (2.5 mg total) by mouth daily.  [name] aspirin 81 MG tablet Take 81 mg by mouth daily.  [name] atorvastatin (LIPITOR) 10 MG tablet TAKE 1 TABLET BY MOUTH DAILY.  . Baclofen 5 MG TABS Take 1 tablet by mouth at bedtime as needed.  . benzonatate (TESSALON) 100 MG capsule Take 1 capsule (100 mg total) by mouth 3 (three) times daily as needed for cough.  . Calcium Carbonate-Vit D-Min (CALCIUM 1200 PO) Take 1 tablet by  mouth.  . doxycycline (VIBRA-TABS) 100 MG tablet Take 1 tablet (100 mg total) by mouth 2 (two) times daily.  [name] FLUoxetine (PROZAC) 10 MG capsule TAKE 1 CAPSULE BY MOUTH DAILY.  . fluticasone (FLONASE) 50 MCG/ACT nasal spray Place 1 spray into both nostrils 2 (two) times daily.  . furosemide (LASIX) 20 MG tablet Take 1 tablet (20 mg total) by mouth daily.  [name] ketoconazole (NIZORAL) 2 % shampoo Use as directed  . levothyroxine (SYNTHROID) 25 MCG tablet TAKE 1/2 TABLET BY MOUTH DAILY WITH 50 MCG TO EQUAL 62.5 MCG TOTAL DAILY  . levothyroxine (SYNTHROID) 50 MCG tablet TAKE 1 TABLET BY MOUTH EVERY DAY  . loratadine (CLARITIN) 10 MG tablet Take 1 tablet (10 mg total) by mouth daily.  . potassium chloride SA (KLOR-CON) 20 MEQ tablet TAKE 1 TABLET BY MOUTH ONCE DAILY.  [name] predniSONE (DELTASONE) 20 MG tablet Take 2 tablets PO x 2 days, then 1 tablet x 2 days, then 0.5 tablet x 2 days  . risperiDONE (RISPERDAL) 1 MG tablet Take 1 tablet (1 mg total) by mouth 2 (two) times daily.  [name] Spacer/Aero-Holding Chambers DEVI 1 Product by Does not apply route as needed.  [name] STIOLTO RESPIMAT 2.5-2.5 MCG/ACT AERS INHALE 2 PUFFS INTO THE LUNGS DAILY  . triamcinolone cream (KENALOG) 0.1 % Apply 1 application topically 2 (  two) times daily. Request jar   Current Facility-Administered Medications (Other)  Medication Route  . ipratropium-albuterol (DUONEB) 0.5-2.5 (3) MG/3ML nebulizer solution 3 mL Nebulization      REVIEW OF SYSTEMS:    ALLERGIES Allergies  Allergen Reactions  . Codeine Sulfate     REACTION: unspecified  . Picato [Ingenol Mebutate] Hives    PAST MEDICAL HISTORY Past Medical History:  Diagnosis Date  . Cancer (HCC)    squamous cell of left face  . COPD (chronic obstructive pulmonary disease) ([place])   . Dementia ([place])   . Dementia ([place])   . GERD (gastroesophageal reflux disease)   . Heart attack ([place]) 1998  . Hypertension   . Stroke ([place])   . Thyroid disease    Past Surgical History:   Procedure Laterality Date  . BREAST LUMPECTOMY  1995   Left Breast  . DILATION AND CURETTAGE OF UTERUS  1975  . GALLBLADDER SURGERY  1993    FAMILY HISTORY Family History  Problem Relation Age of Onset  . Hypertension Mother   . Stroke Mother   . Stroke Sister   . Stroke Sister   . Stroke Sister     SOCIAL HISTORY Social History   Tobacco Use  . Smoking status: Former Research scientist (life sciences)  . Smokeless tobacco: Never Used  Vaping Use  . Vaping Use: Never used  Substance Use Topics  . Alcohol use: No  . Drug use: No         OPHTHALMIC EXAM:  Base Eye Exam    Visual Acuity (Snellen - Linear)      Right Left   Dist cc 20/50 -1 En Card @ 4'   Dist ph cc NI NI   Correction: Glasses       Tonometry (Tonopen, 10:53 AM)      Right Left   Pressure 12 12       Pupils      Pupils Dark Light Shape React APD   Right PERRL 4 3 Round Brisk None   Left PERRL 4 3 Round Brisk None       Visual Fields (Counting fingers)      Left Right    Full Full  Poor understanding       Neuro/Psych    Oriented x3: Yes   Mood/Affect: Normal       Dilation    Both eyes: 1.0% Mydriacyl, 2.5% Phenylephrine @ 10:53 AM        Slit Lamp and Fundus Exam    External Exam      Right Left   External Normal Normal       Slit Lamp Exam      Right Left   Lids/Lashes Normal Normal   Conjunctiva/Sclera White and quiet White and quiet   Cornea Clear Clear   Anterior Chamber Deep and quiet Deep and quiet   Iris Round and reactive Round and reactive   Anterior Vitreous Normal Normal       Fundus Exam      Right Left   Posterior Vitreous Posterior vitreous detachment Posterior vitreous detachment   Disc Peripapillary atrophy Peripapillary atrophy   C/D Ratio 0.65 0.6   Macula Retinal pigment epithelial mottling, no macular thickening, Retinal atrophy, Early age related macular degeneration, no serous retinal detachment visible clinically, minor epiretinal membrane on distorting Advanced  age related macular degeneration, Retinal pigment epithelial atrophy - geographic   Vessels Normal Normal   Periphery Normal Normal  IMAGING AND PROCEDURES  Imaging and Procedures for 12/03/19  OCT, Retina - OU - Both Eyes       Right Eye Quality was good. Scan locations included subfoveal. Central Foveal Thickness: 339. Progression has worsened. Findings include choroidal neovascular membrane, subretinal fluid.   Left Eye Quality was good. Scan locations included subfoveal. Central Foveal Thickness: 172. Findings include outer retinal atrophy, central retinal atrophy, inner retinal atrophy, abnormal foveal contour.   Notes OD with new subfoveal serous retinal detachment, with intraretinal fluid in the nasal aspect of the macula, type 3 CNVM  OS with diffuse retinal atrophy, no change over time       Intravitreal Injection, Pharmacologic Agent - OD - Right Eye       Time Out 12/03/2019. 11:21 AM. Confirmed correct patient, procedure, site, and patient consented.   Anesthesia Anesthetic medications included [place] 3.5%.   Procedure Preparation included Ofloxacin , Tobramycin 0.3%, 10% betadine to eyelids, 5% betadine to ocular surface. A 30 gauge needle was used.   Injection:  1.25 mg Bevacizumab (AVASTIN) SOLN   NDC: 70360-001-02, Lot: [id number]   Route: Intravitreal, Site: Right Eye, Waste: 0 mg  Post-op Post injection exam found visual acuity of at least counting fingers. The patient tolerated the procedure well. There were no complications. The patient received written and verbal post procedure care education. Post injection medications were not given.                 ASSESSMENT/PLAN:  Exudative age-related macular degeneration of right eye with active choroidal neovascularization (HCC) New subretinal fluid in the fovea as well as intraretinal fluid, type III CNVM, will commence therapy with antivegF, Avastin intravitreal OD today  Advanced  nonexudative age-related macular degeneration of left eye with subfoveal involvement No change, diffuse retinal atrophy, accounts for acuity      ICD-10-CM   1. Exudative age-related macular degeneration of right eye with active choroidal neovascularization (HCC)  H35.3211 Intravitreal Injection, Pharmacologic Agent - OD - Right Eye    Bevacizumab (AVASTIN) SOLN 1.25 mg  2. Exudative age-related macular degeneration of left eye with inactive choroidal neovascularization (HCC)  H35.3222 OCT, Retina - OU - Both Eyes  3. Advanced nonexudative age-related macular degeneration of left eye with subfoveal involvement  H35.3124     1.  Patient informed of the need for intravitreal therapy right eye to protect and prevent decline of acuity in the right eye from new onset wet ARMD  2.  3.  Ophthalmic Meds Ordered this visit:  Meds ordered this encounter  Medications  . Bevacizumab (AVASTIN) SOLN 1.25 mg       Return in about 5 weeks (around 01/07/2020) for dilate, OD, AVASTIN OCT.  There are no Patient Instructions on file for this visit.   Explained the diagnoses, plan, and follow up with the patient and they expressed understanding.  Patient expressed understanding of the importance of proper follow up care.   Clent Demark Latif Nazareno M.D. Diseases & Surgery of the Retina and Vitreous [facility] [place] 12/03/19     Abbreviations: M myopia (nearsighted); A astigmatism; H hyperopia (farsighted); P presbyopia; Mrx spectacle prescription;  CTL contact lenses; OD right eye; OS left eye; OU both eyes  XT exotropia; ET esotropia; PEK punctate epithelial keratitis; PEE punctate epithelial erosions; DES dry eye syndrome; MGD meibomian gland dysfunction; ATs artificial tears; PFAT's preservative free artificial tears; [place] nuclear sclerotic cataract; PSC posterior subcapsular cataract; ERM epi-retinal membrane; PVD posterior vitreous detachment; RD retinal  detachment; DM diabetes mellitus; DR  diabetic retinopathy; NPDR non-proliferative diabetic retinopathy; PDR proliferative diabetic retinopathy; CSME clinically significant macular edema; DME diabetic macular edema; dbh dot blot hemorrhages; CWS cotton wool spot; POAG primary open angle glaucoma; C/D cup-to-disc ratio; HVF humphrey visual field; GVF goldmann visual field; OCT optical coherence tomography; IOP intraocular pressure; BRVO Branch retinal vein occlusion; CRVO central retinal vein occlusion; CRAO central retinal artery occlusion; BRAO branch retinal artery occlusion; RT retinal tear; SB scleral buckle; PPV pars plana vitrectomy; VH Vitreous hemorrhage; PRP panretinal laser photocoagulation; IVK intravitreal kenalog; VMT vitreomacular traction; MH Macular hole;  NVD neovascularization of the disc; NVE neovascularization elsewhere; AREDS age related eye disease study; ARMD age related macular degeneration; POAG primary open angle glaucoma; EBMD epithelial/anterior basement membrane dystrophy; ACIOL anterior chamber intraocular lens; IOL intraocular lens; PCIOL posterior chamber intraocular lens; Phaco/IOL phacoemulsification with intraocular lens placement; [place] photorefractive keratectomy; LASIK laser assisted in situ keratomileusis; HTN hypertension; DM diabetes mellitus; COPD chronic obstructive pulmonary disease

## 2019-12-03 NOTE — Patient Instructions (Signed)
Patient instructed to notify the office promptly if new onset visual acuity decline or distortions

## 2019-12-05 ENCOUNTER — Encounter (INDEPENDENT_AMBULATORY_CARE_PROVIDER_SITE_OTHER): Payer: Medicare HMO | Admitting: Ophthalmology

## 2019-12-10 ENCOUNTER — Other Ambulatory Visit: Payer: Self-pay | Admitting: Physician Assistant

## 2019-12-11 ENCOUNTER — Ambulatory Visit (INDEPENDENT_AMBULATORY_CARE_PROVIDER_SITE_OTHER): Payer: Medicare HMO

## 2019-12-11 ENCOUNTER — Other Ambulatory Visit: Payer: Self-pay

## 2019-12-11 DIAGNOSIS — Z23 Encounter for immunization: Secondary | ICD-10-CM | POA: Diagnosis not present

## 2019-12-11 MED ORDER — SHINGRIX 50 MCG/0.5ML IM SUSR: 0.5000 mL | Freq: Once | INTRAMUSCULAR | 0 refills | Status: AC

## 2019-12-11 NOTE — Progress Notes (Signed)
Pt here for influenza vaccine.  Screening questionnaire reviewed, VIS provided to patient, and any/all patient questions answered.  Pt's daughter also requests RX be sent to [facility] for shingrix #2.  Charyl Bigger, CMA

## 2019-12-17 ENCOUNTER — Other Ambulatory Visit: Payer: Self-pay | Admitting: Physician Assistant

## 2019-12-31 ENCOUNTER — Other Ambulatory Visit: Payer: Self-pay | Admitting: Physician Assistant

## 2019-12-31 DIAGNOSIS — E039 Hypothyroidism, unspecified: Secondary | ICD-10-CM

## 2020-01-07 ENCOUNTER — Other Ambulatory Visit: Payer: Self-pay

## 2020-01-07 ENCOUNTER — Ambulatory Visit: Payer: Medicare HMO | Admitting: Dermatology

## 2020-01-07 DIAGNOSIS — L578 Other skin changes due to chronic exposure to nonionizing radiation: Secondary | ICD-10-CM

## 2020-01-07 DIAGNOSIS — L821 Other seborrheic keratosis: Secondary | ICD-10-CM | POA: Diagnosis not present

## 2020-01-07 DIAGNOSIS — L820 Inflamed seborrheic keratosis: Secondary | ICD-10-CM | POA: Diagnosis not present

## 2020-01-07 DIAGNOSIS — Z85828 Personal history of other malignant neoplasm of skin: Secondary | ICD-10-CM

## 2020-01-07 DIAGNOSIS — L570 Actinic keratosis: Secondary | ICD-10-CM

## 2020-01-07 NOTE — Patient Instructions (Signed)

## 2020-01-07 NOTE — Progress Notes (Signed)
   New Patient Visit  Subjective  Donna Arias is a 86 y.o. female who presents for the following: Other (Spots of face, arms and legs. She has a history of SCC treated at [facility] in the past. She was given a cream to treat her face with but had such a vigorous reaction that she stopped using it.).  Accompanied by daughter who contributes to history.  The following portions of the chart were reviewed this encounter and updated as appropriate:   Tobacco  Allergies  Meds  Problems  Med Hx  Surg Hx  Fam Hx     Review of Systems:  No other skin or systemic complaints except as noted in HPI or Assessment and Plan.  Objective  Well appearing patient in no apparent distress; mood and affect are within normal limits.  A focused examination was performed including face, arms, legs. Relevant physical exam findings are noted in the Assessment and Plan.  Objective  Face (16): Erythematous thin papules/macules with gritty scale.   Objective  Arms, legs (18): Erythematous keratotic or waxy stuck-on papule or plaque.    Assessment & Plan    Actinic Damage - chronic, secondary to cumulative UV radiation exposure/sun exposure over time - diffuse scaly erythematous macules with underlying dyspigmentation - Recommend daily broad spectrum sunscreen SPF 30+ to sun-exposed areas, reapply every 2 hours as needed.  - Call for new or changing lesions.  Seborrheic Keratoses - Stuck-on, waxy, tan-brown papules and plaques  - Discussed benign etiology and prognosis. - Observe - Call for any changes  AK (actinic keratosis) (16) Face  Destruction of lesion - Face Complexity: simple   Destruction method: cryotherapy   Informed consent: discussed and consent obtained   Timeout:  patient name, date of birth, surgical site, and procedure verified Lesion destroyed using liquid nitrogen: Yes   Region frozen until ice ball extended beyond lesion: Yes   Outcome: patient tolerated  procedure well with no complications   Post-procedure details: wound care instructions given    Inflamed seborrheic keratosis (18) Arms, legs  Destruction of lesion - Arms, legs Complexity: simple   Destruction method: cryotherapy   Informed consent: discussed and consent obtained   Timeout:  patient name, date of birth, surgical site, and procedure verified Lesion destroyed using liquid nitrogen: Yes   Region frozen until ice ball extended beyond lesion: Yes   Outcome: patient tolerated procedure well with no complications   Post-procedure details: wound care instructions given    Return in about 3 months (around 04/06/2020).   I, Ashok Cordia, CMA, am acting as scribe for Sarina Ser, MD .  Documentation: I have reviewed the above documentation for accuracy and completeness, and I agree with the above.  Sarina Ser, MD

## 2020-01-08 ENCOUNTER — Encounter (INDEPENDENT_AMBULATORY_CARE_PROVIDER_SITE_OTHER): Payer: Self-pay | Admitting: Ophthalmology

## 2020-01-08 ENCOUNTER — Ambulatory Visit (INDEPENDENT_AMBULATORY_CARE_PROVIDER_SITE_OTHER): Payer: Medicare HMO | Admitting: Ophthalmology

## 2020-01-08 DIAGNOSIS — H353124 Nonexudative age-related macular degeneration, left eye, advanced atrophic with subfoveal involvement: Secondary | ICD-10-CM | POA: Diagnosis not present

## 2020-01-08 DIAGNOSIS — H353211 Exudative age-related macular degeneration, right eye, with active choroidal neovascularization: Secondary | ICD-10-CM

## 2020-01-08 MED ORDER — BEVACIZUMAB 2.5 MG/0.1ML IZ SOSY
2.5000 mg | PREFILLED_SYRINGE | INTRAVITREAL | Status: AC | PRN
Start: 2020-01-08 — End: 2020-01-08
  Administered 2020-01-08: 11:00:00 2.5 mg via INTRAVITREAL

## 2020-01-08 NOTE — Assessment & Plan Note (Signed)
OD vastly improved today at 5-week interval post intravitreal Avastin, will repeat injection today and examination again in 5 to 6-week, in order to preserve acuity

## 2020-01-08 NOTE — Progress Notes (Addendum)
01/08/2020     CHIEF COMPLAINT Patient presents for Retina Follow Up   HISTORY OF PRESENT ILLNESS: Donna Arias is a 86 y.o. female who presents to the clinic today for:   HPI    Retina Follow Up    Patient presents with  Wet AMD.  In right eye.  This started 6 weeks ago.  Duration of 6 weeks.          Comments    6 WK FU OD, POSS AVASTIN OD   Pt reports stable vision OU, no new f/f, no pain or pressure.        Last edited by Nichola Sizer D on 01/08/2020 10:19 AM. (History)      Referring physician: Lorrene Reid, PA-C [address] [place],  [place] [address]  HISTORICAL INFORMATION:   Selected notes from the MEDICAL RECORD NUMBER    Lab Results  Component Value Date   HGBA1C 5.6 08/07/2019     CURRENT MEDICATIONS: No current outpatient medications on file. (Ophthalmic Drugs)   No current facility-administered medications for this visit. (Ophthalmic Drugs)   Current Outpatient Medications (Other)  Medication Sig  . albuterol (PROVENTIL HFA;VENTOLIN HFA) 108 (90 Base) MCG/ACT inhaler Inhale 2 puffs into the lungs every 6 (six) hours as needed. (Patient not taking: Reported on 11/12/2019)  . albuterol (PROVENTIL) (2.5 MG/3ML) 0.083% nebulizer solution Take 3 mLs (2.5 mg total) by nebulization every 6 (six) hours as needed for wheezing or shortness of breath.  [name] amLODipine (NORVASC) 2.5 MG tablet TAKE 1 TABLET (2.5 MG TOTAL) BY MOUTH DAILY.  [name] aspirin 81 MG tablet Take 81 mg by mouth daily.  [name] atorvastatin (LIPITOR) 10 MG tablet TAKE 1 TABLET BY MOUTH DAILY.  . Baclofen 5 MG TABS Take 1 tablet by mouth at bedtime as needed.  . benzonatate (TESSALON) 100 MG capsule Take 1 capsule (100 mg total) by mouth 3 (three) times daily as needed for cough.  . Calcium Carbonate-Vit D-Min (CALCIUM 1200 PO) Take 1 tablet by mouth.  . doxycycline (VIBRA-TABS) 100 MG tablet Take 1 tablet (100 mg total) by mouth 2 (two) times daily.  [name] FLUoxetine (PROZAC) 10  MG capsule TAKE 1 CAPSULE BY MOUTH DAILY.  . fluticasone (FLONASE) 50 MCG/ACT nasal spray Place 1 spray into both nostrils 2 (two) times daily.  . furosemide (LASIX) 20 MG tablet Take 1 tablet (20 mg total) by mouth daily.  [name] ketoconazole (NIZORAL) 2 % shampoo Use as directed  . levothyroxine (SYNTHROID) 25 MCG tablet TAKE 1/2 TABLET BY MOUTH DAILY WITH 50 MCG TO EQUAL 62.5 MCG TOTAL DAILY  . levothyroxine (SYNTHROID) 50 MCG tablet TAKE 1 TABLET BY MOUTH EVERY DAY  . loratadine (CLARITIN) 10 MG tablet Take 1 tablet (10 mg total) by mouth daily.  . potassium chloride SA (KLOR-CON) 20 MEQ tablet TAKE 1 TABLET BY MOUTH ONCE DAILY.  [name] predniSONE (DELTASONE) 20 MG tablet Take 2 tablets PO x 2 days, then 1 tablet x 2 days, then 0.5 tablet x 2 days  . risperiDONE (RISPERDAL) 1 MG tablet Take 1 tablet (1 mg total) by mouth 2 (two) times daily.  [name] Spacer/Aero-Holding Chambers DEVI 1 Product by Does not apply route as needed.  [name] STIOLTO RESPIMAT 2.5-2.5 MCG/ACT AERS INHALE 2 PUFFS INTO THE LUNGS DAILY  . triamcinolone cream (KENALOG) 0.1 % Apply 1 application topically 2 (two) times daily. Request jar   Current Facility-Administered Medications (Other)  Medication Route  . ipratropium-albuterol (DUONEB) 0.5-2.5 (  3) MG/3ML nebulizer solution 3 mL Nebulization      REVIEW OF SYSTEMS:    ALLERGIES Allergies  Allergen Reactions  . Codeine Sulfate     REACTION: unspecified  . Picato [Ingenol Mebutate] Hives    PAST MEDICAL HISTORY Past Medical History:  Diagnosis Date  . Cancer (HCC)    squamous cell of left face  . COPD (chronic obstructive pulmonary disease) ([place])   . Dementia ([place])   . Dementia ([place])   . GERD (gastroesophageal reflux disease)   . Heart attack ([place]) 1998  . Hypertension   . Stroke ([place])   . Thyroid disease    Past Surgical History:  Procedure Laterality Date  . BREAST LUMPECTOMY  1995   Left Breast  . DILATION AND CURETTAGE OF UTERUS  1975  . GALLBLADDER  SURGERY  1993    FAMILY HISTORY Family History  Problem Relation Age of Onset  . Hypertension Mother   . Stroke Mother   . Stroke Sister   . Stroke Sister   . Stroke Sister     SOCIAL HISTORY Social History   Tobacco Use  . Smoking status: Former Research scientist (life sciences)  . Smokeless tobacco: Never Used  Vaping Use  . Vaping Use: Never used  Substance Use Topics  . Alcohol use: No  . Drug use: No         OPHTHALMIC EXAM:  Base Eye Exam    Visual Acuity (ETDRS)      Right Left   Dist cc 20/40 ECARD @ 4 ft   Dist ph cc NI NI   Correction: Glasses       Tonometry (Tonopen, 10:25 AM)      Right Left   Pressure 10 11       Pupils      Pupils Dark Light Shape React APD   Right PERRL 4 3 Round Brisk None   Left PERRL 4 3 Round Brisk None       Visual Fields (Counting fingers)      Left Right    Full Full       Extraocular Movement      Right Left    Full Full       Neuro/Psych    Oriented x3: Yes   Mood/Affect: Normal       Dilation    Right eye: 1.0% Mydriacyl, 2.5% Phenylephrine @ 10:25 AM        Slit Lamp and Fundus Exam    External Exam      Right Left   External Normal Normal       Slit Lamp Exam      Right Left   Lids/Lashes Normal Normal   Conjunctiva/Sclera White and quiet White and quiet   Cornea Clear Clear   Anterior Chamber Deep and quiet Deep and quiet   Iris Round and reactive Round and reactive   Lens Centered posterior chamber intraocular lens Centered posterior chamber intraocular lens   Anterior Vitreous Normal Normal       Fundus Exam      Right Left   Posterior Vitreous Posterior vitreous detachment    Disc Peripapillary atrophy    C/D Ratio 0.65    Macula Retinal pigment epithelial mottling, no macular thickening, Retinal atrophy, Early age related macular degeneration, no serous retinal detachment visible clinically, minor epiretinal membrane on distorting    Vessels Normal    Periphery Normal           IMAGING AND  PROCEDURES  Imaging and Procedures for 01/08/20  OCT, Retina - OU - Both Eyes       Right Eye Quality was good. Scan locations included subfoveal. Central Foveal Thickness: 297. Progression has improved.   Left Eye Quality was good. Scan locations included subfoveal. Central Foveal Thickness: 178. Progression has been stable.   Notes Much less intraretinal fluid superonasal to the fovea OD, persistent subfoveal fluid remains.  OD.  OS with macular atrophy from previous retinal vascular disease.  Stable overall       Intravitreal Injection, Pharmacologic Agent - OD - Right Eye       Time Out 01/08/2020. 10:56 AM. Confirmed correct patient, procedure, site, and patient consented.   Anesthesia Anesthetic medications included [place] 3.5%.   Procedure Preparation included Ofloxacin , Tobramycin 0.3%, 10% betadine to eyelids, 5% betadine to ocular surface. A 30 gauge needle was used.   Injection:  2.5 mg Bevacizumab (AVASTIN) 2.5mg /0.17mL SOSY   NDC: 81856-314-97, Lot: [id number]   Route: Intravitreal, Site: Right Eye  Post-op Post injection exam found visual acuity of at least counting fingers. The patient tolerated the procedure well. There were no complications. The patient received written and verbal post procedure care education. Post injection medications were not given.                 ASSESSMENT/PLAN:  Exudative age-related macular degeneration of right eye with active choroidal neovascularization (HCC) OD vastly improved today at 5-week interval post intravitreal Avastin, will repeat injection today and examination again in 5 to 6-week, in order to preserve acuity  Advanced nonexudative age-related macular degeneration of left eye with subfoveal involvement The nature of dry age related macular degeneration was discussed with the patient as well as its possible conversion to wet. The results of the AREDS 2 study was discussed with the patient. A diet rich in dark  leafy green vegetables was advised and specific recommendations were made regarding supplements with AREDS 2 formulation . Control of hypertension and serum cholesterol may slow the disease. Smoking cessation is mandatory to slow the disease and diminish the risk of progressing to wet age related macular degeneration. The patient was instructed in the use of an [place] and was told to return immediately for any changes in the Grid. Stressed to the patient do not rub eyes      ICD-10-CM   1. Exudative age-related macular degeneration of right eye with active choroidal neovascularization (HCC)  H35.3211 OCT, Retina - OU - Both Eyes    Intravitreal Injection, Pharmacologic Agent - OD - Right Eye    bevacizumab (AVASTIN) SOSY 2.5 mg  2. Advanced nonexudative age-related macular degeneration of left eye with subfoveal involvement  H35.3124     1.  Repeat injection intravitreal Avastin today at 5 weeks, much less intraretinal fluid and preservation of acuity today.  2.  Dilate OD again in 5 weeks  3.  Ophthalmic Meds Ordered this visit:  Meds ordered this encounter  Medications  . bevacizumab (AVASTIN) SOSY 2.5 mg       Return in about 5 weeks (around 02/12/2020) for dilate, AVASTIN OCT, OD.  Patient Instructions  Patient instructed to contact the office promptly for new onset visual acuity decline or distortions    Explained the diagnoses, plan, and follow up with the patient and they expressed understanding.  Patient expressed understanding of the importance of proper follow up care.   Clent Demark Nicki Gracy M.D. Diseases & Surgery of the Retina and Vitreous Retina [facility] Diabetic  [place] 01/08/20     Abbreviations: M myopia (nearsighted); A astigmatism; H hyperopia (farsighted); P presbyopia; Mrx spectacle prescription;  CTL contact lenses; OD right eye; OS left eye; OU both eyes  XT exotropia; ET esotropia; PEK punctate epithelial keratitis; PEE punctate epithelial erosions; DES dry  eye syndrome; MGD meibomian gland dysfunction; ATs artificial tears; PFAT's preservative free artificial tears; [place] nuclear sclerotic cataract; PSC posterior subcapsular cataract; ERM epi-retinal membrane; PVD posterior vitreous detachment; RD retinal detachment; DM diabetes mellitus; DR diabetic retinopathy; NPDR non-proliferative diabetic retinopathy; PDR proliferative diabetic retinopathy; CSME clinically significant macular edema; DME diabetic macular edema; dbh dot blot hemorrhages; CWS cotton wool spot; POAG primary open angle glaucoma; C/D cup-to-disc ratio; HVF humphrey visual field; GVF goldmann visual field; OCT optical coherence tomography; IOP intraocular pressure; BRVO Branch retinal vein occlusion; CRVO central retinal vein occlusion; CRAO central retinal artery occlusion; BRAO branch retinal artery occlusion; RT retinal tear; SB scleral buckle; PPV pars plana vitrectomy; VH Vitreous hemorrhage; PRP panretinal laser photocoagulation; IVK intravitreal kenalog; VMT vitreomacular traction; MH Macular hole;  NVD neovascularization of the disc; NVE neovascularization elsewhere; AREDS age related eye disease study; ARMD age related macular degeneration; POAG primary open angle glaucoma; EBMD epithelial/anterior basement membrane dystrophy; ACIOL anterior chamber intraocular lens; IOL intraocular lens; PCIOL posterior chamber intraocular lens; Phaco/IOL phacoemulsification with intraocular lens placement; [place] photorefractive keratectomy; LASIK laser assisted in situ keratomileusis; HTN hypertension; DM diabetes mellitus; COPD chronic obstructive pulmonary disease

## 2020-01-08 NOTE — Assessment & Plan Note (Signed)

## 2020-01-08 NOTE — Patient Instructions (Signed)
Patient instructed to contact the office promptly for new onset visual acuity decline or distortions 

## 2020-01-09 ENCOUNTER — Encounter: Payer: Self-pay | Admitting: Physician Assistant

## 2020-01-09 ENCOUNTER — Other Ambulatory Visit: Payer: Self-pay

## 2020-01-09 ENCOUNTER — Ambulatory Visit (INDEPENDENT_AMBULATORY_CARE_PROVIDER_SITE_OTHER): Payer: Medicare HMO | Admitting: Physician Assistant

## 2020-01-09 VITALS — BP 101/66 | HR 80 | Ht 65.5 in | Wt 157.9 lb

## 2020-01-09 DIAGNOSIS — J209 Acute bronchitis, unspecified: Secondary | ICD-10-CM

## 2020-01-09 DIAGNOSIS — J440 Chronic obstructive pulmonary disease with acute lower respiratory infection: Secondary | ICD-10-CM | POA: Diagnosis not present

## 2020-01-09 DIAGNOSIS — R059 Cough, unspecified: Secondary | ICD-10-CM | POA: Diagnosis not present

## 2020-01-09 DIAGNOSIS — R0981 Nasal congestion: Secondary | ICD-10-CM

## 2020-01-09 LAB — POCT INFLUENZA A/B
Influenza A, POC: NEGATIVE
Influenza B, POC: NEGATIVE

## 2020-01-09 MED ORDER — METHYLPREDNISOLONE ACETATE 40 MG/ML IJ SUSP
40.0000 mg | Freq: Once | INTRAMUSCULAR | Status: AC
  Administered 2020-01-09: 11:00:00 40 mg via INTRAMUSCULAR

## 2020-01-09 MED ORDER — BENZONATATE 100 MG PO CAPS: 100.0000 mg | ORAL_CAPSULE | Freq: Three times a day (TID) | ORAL | 0 refills | Status: DC | PRN

## 2020-01-09 MED ORDER — DOXYCYCLINE HYCLATE 100 MG PO TABS: 100.0000 mg | ORAL_TABLET | Freq: Two times a day (BID) | ORAL | 0 refills | Status: DC

## 2020-01-09 NOTE — Progress Notes (Signed)
Acute Office Visit  Subjective:    Patient ID: Donna Arias, female    DOB: Oct 03, 1926, 86 y.o.   MRN: [id number]  Chief Complaint  Patient presents with  . Cough  . Nasal Congestion         HPI Patient is in today for c/o of nasal congestion, shortness of breath, sore throat, headache, decreased appetite, productive cough with yellow sputum, and chills. Patient denies fever, nausea, vomiting or diarrhea. Symptoms started over the weekend. Has been using a humidifier to help with symptoms.  Has not tried anything else.  Patient has been fully vaccinated against Covid and has obtained influenza vaccine.  Past Medical History:  Diagnosis Date  . Cancer (HCC)    squamous cell of left face  . COPD (chronic obstructive pulmonary disease) ([place])   . Dementia ([place])   . Dementia ([place])   . GERD (gastroesophageal reflux disease)   . Heart attack ([place]) 1998  . Hypertension   . Stroke ([place])   . Thyroid disease     Past Surgical History:  Procedure Laterality Date  . BREAST LUMPECTOMY  1995   Left Breast  . DILATION AND CURETTAGE OF UTERUS  1975  . GALLBLADDER SURGERY  1993    Family History  Problem Relation Age of Onset  . Hypertension Mother   . Stroke Mother   . Stroke Sister   . Stroke Sister   . Stroke Sister     Social History   Socioeconomic History  . Marital status: Widowed    Spouse name: Not on file  . Number of children: 1  . Years of education: Not on file  . Highest education level: Not on file  Occupational History  . Occupation: RETIRED  Tobacco Use  . Smoking status: Former Research scientist (life sciences)  . Smokeless tobacco: Never Used  Vaping Use  . Vaping Use: Never used  Substance and Sexual Activity  . Alcohol use: No  . Drug use: No  . Sexual activity: Never    Birth control/protection: None  Other Topics Concern  . Not on file  Social History Narrative   CB x 1   Lives at home with her daughter (since 02/05/2013)   Right handed   Regular exercise - NO    Social Determinants of Health   Financial Resource Strain: Not on file  Food Insecurity: Not on file  Transportation Needs: Not on file  Physical Activity: Not on file  Stress: Not on file  Social Connections: Not on file  Intimate Partner Violence: Not on file    Outpatient Medications Prior to Visit  Medication Sig Dispense Refill  . albuterol (PROVENTIL) (2.5 MG/3ML) 0.083% nebulizer solution Take 3 mLs (2.5 mg total) by nebulization every 6 (six) hours as needed for wheezing or shortness of breath. 75 mL 6  . amLODipine (NORVASC) 2.5 MG tablet TAKE 1 TABLET (2.5 MG TOTAL) BY MOUTH DAILY. 90 tablet 0  . aspirin 81 MG tablet Take 81 mg by mouth daily.    [name] atorvastatin (LIPITOR) 10 MG tablet TAKE 1 TABLET BY MOUTH DAILY. 90 tablet 0  . Baclofen 5 MG TABS Take 1 tablet by mouth at bedtime as needed. 30 tablet 0  . Calcium Carbonate-Vit D-Min (CALCIUM 1200 PO) Take 1 tablet by mouth.    [name] FLUoxetine (PROZAC) 10 MG capsule TAKE 1 CAPSULE BY MOUTH DAILY. 90 capsule 0  . fluticasone (FLONASE) 50 MCG/ACT nasal spray Place 1 spray into both nostrils 2 (two) times daily.  16 g 6  . furosemide (LASIX) 20 MG tablet Take 1 tablet (20 mg total) by mouth daily. 90 tablet 0  . ketoconazole (NIZORAL) 2 % shampoo Use as directed 120 mL 1  . levothyroxine (SYNTHROID) 25 MCG tablet TAKE 1/2 TABLET BY MOUTH DAILY WITH 50 MCG TO EQUAL 62.5 MCG TOTAL DAILY 45 tablet 0  . levothyroxine (SYNTHROID) 50 MCG tablet TAKE 1 TABLET BY MOUTH EVERY DAY 90 tablet 0  . loratadine (CLARITIN) 10 MG tablet Take 1 tablet (10 mg total) by mouth daily. 90 tablet 1  . potassium chloride SA (KLOR-CON) 20 MEQ tablet TAKE 1 TABLET BY MOUTH ONCE DAILY. 90 tablet 0  . risperiDONE (RISPERDAL) 1 MG tablet Take 1 tablet (1 mg total) by mouth 2 (two) times daily. 120 tablet 0  . STIOLTO RESPIMAT 2.5-2.5 MCG/ACT AERS INHALE 2 PUFFS INTO THE LUNGS DAILY 4 g 1  . triamcinolone cream (KENALOG) 0.1 % Apply 1 application topically 2  (two) times daily. Request jar 453 g 0  . albuterol (PROVENTIL HFA;VENTOLIN HFA) 108 (90 Base) MCG/ACT inhaler Inhale 2 puffs into the lungs every 6 (six) hours as needed. (Patient not taking: No sig reported) 1 Inhaler 1  . predniSONE (DELTASONE) 20 MG tablet Take 2 tablets PO x 2 days, then 1 tablet x 2 days, then 0.5 tablet x 2 days (Patient not taking: Reported on 01/09/2020) 7 tablet 0  . Spacer/Aero-Holding Chambers DEVI 1 Product by Does not apply route as needed. (Patient not taking: Reported on 01/09/2020) 1 each 1  . benzonatate (TESSALON) 100 MG capsule Take 1 capsule (100 mg total) by mouth 3 (three) times daily as needed for cough. (Patient not taking: Reported on 01/09/2020) 30 capsule 0  . doxycycline (VIBRA-TABS) 100 MG tablet Take 1 tablet (100 mg total) by mouth 2 (two) times daily. (Patient not taking: Reported on 01/09/2020) 14 tablet 0   Facility-Administered Medications Prior to Visit  Medication Dose Route Frequency Provider Last Rate Last Admin  . ipratropium-albuterol (DUONEB) 0.5-2.5 (3) MG/3ML nebulizer solution 3 mL  3 mL Nebulization Q6H Danford, Katy D, NP   3 mL at 12/08/17 0958    Allergies  Allergen Reactions  . Codeine Sulfate     REACTION: unspecified  . Picato [Ingenol Mebutate] Hives    Review of Systems A fourteen system review of systems was performed and found to be positive as per HPI.  Objective:    Physical Exam General: Pleasant and cooperative, in no acute distress.  Neuro:  Alert and oriented,  extra-ocular muscles intact  HEENT:  Normocephalic, atraumatic, no TTP of frontal or maxillary sinus, mildly erythematous posterior nasopharynx without exudates, neck supple Skin:  no gross rash, warm, pink. Cardiac:  RRR, S1 S2 Respiratory: Expiratory wheezing with rhonchi, no crackles or rales, Not using accessory muscles. Vascular:  Ext warm, no cyanosis apprec. Psych:  No HI/SI, judgement and insight good, Euthymic mood. Full Affect.   BP  101/66   Pulse 80   Ht 5' 5.5" (1.664 m)   Wt 157 lb 14.4 oz (71.6 kg)   SpO2 98%   BMI 25.88 kg/m  Wt Readings from Last 3 Encounters:  01/09/20 157 lb 14.4 oz (71.6 kg)  11/12/19 161 lb (73 kg)  08/14/19 157 lb 9.6 oz (71.5 kg)    Health Maintenance Due  Topic Date Due  . TETANUS/TDAP  02/25/2018  . COVID-19 Vaccine (3 - Pfizer risk 4-dose series) 05/29/2019    There are no preventive care  reminders to display for this patient.   Lab Results  Component Value Date   TSH 2.790 08/07/2019   Lab Results  Component Value Date   WBC 7.1 09/12/2019   HGB 13.7 09/12/2019   HCT 41.2 09/12/2019   MCV 94 09/12/2019   PLT 156 09/12/2019   Lab Results  Component Value Date   NA 143 09/12/2019   K 3.8 09/12/2019   CO2 25 09/12/2019   GLUCOSE 89 09/12/2019   BUN 18 09/12/2019   CREATININE 1.25 (H) 09/12/2019   BILITOT 0.4 09/12/2019   ALKPHOS 91 09/12/2019   AST 19 09/12/2019   ALT 15 09/12/2019   PROT 6.4 09/12/2019   ALBUMIN 3.9 09/12/2019   CALCIUM 9.9 09/12/2019   GFR 45.15 (L) 08/01/2015   Lab Results  Component Value Date   CHOL 154 08/07/2019   Lab Results  Component Value Date   HDL 59 08/07/2019   Lab Results  Component Value Date   LDLCALC 77 08/07/2019   Lab Results  Component Value Date   TRIG 101 08/07/2019   Lab Results  Component Value Date   CHOLHDL 2.6 08/07/2019   Lab Results  Component Value Date   HGBA1C 5.6 08/07/2019       Assessment & Plan:   Problem List Items Addressed This Visit      Respiratory   Acute bronchitis with COPD ([place]) - Primary   Relevant Medications   doxycycline (VIBRA-TABS) 100 MG tablet   benzonatate (TESSALON) 100 MG capsule    Other Visit Diagnoses    Cough       Relevant Orders   POCT Influenza A/B (Completed)   Nasal congestion       Relevant Orders   POCT Influenza A/B (Completed)     Acute bronchitis with COPD, Cough, Nasal congestion: -Collected influenza swab to r/o infection which  resulted negative.  -Will start Doxycycline and administer IM depo-medrol 40 mg for acute bronchitis. -Will send refill of tessalon perles for cough and recommend to continue with home supportive therapy.  -If symptoms fail to improve or worsen recommend further evaluation with imaging (chest xray).   Meds ordered this encounter  Medications  . methylPREDNISolone acetate (DEPO-MEDROL) injection 40 mg  . doxycycline (VIBRA-TABS) 100 MG tablet    Sig: Take 1 tablet (100 mg total) by mouth 2 (two) times daily.    Dispense:  14 tablet    Refill:  0  . benzonatate (TESSALON) 100 MG capsule    Sig: Take 1 capsule (100 mg total) by mouth 3 (three) times daily as needed for cough.    Dispense:  30 capsule    Refill:  0     Lorrene Reid, PA-C

## 2020-01-09 NOTE — Patient Instructions (Signed)

## 2020-01-10 ENCOUNTER — Encounter: Payer: Self-pay | Admitting: Dermatology

## 2020-01-11 ENCOUNTER — Ambulatory Visit
Admission: EM | Admit: 2020-01-11 | Discharge: 2020-01-11 | Disposition: A | Discharge status: Home / Self Care | Payer: Medicare HMO | Attending: Urgent Care | Admitting: Urgent Care

## 2020-01-11 ENCOUNTER — Other Ambulatory Visit: Payer: Self-pay

## 2020-01-11 ENCOUNTER — Ambulatory Visit (INDEPENDENT_AMBULATORY_CARE_PROVIDER_SITE_OTHER): Payer: Medicare HMO

## 2020-01-11 DIAGNOSIS — R059 Cough, unspecified: Secondary | ICD-10-CM

## 2020-01-11 DIAGNOSIS — R062 Wheezing: Secondary | ICD-10-CM

## 2020-01-11 DIAGNOSIS — J42 Unspecified chronic bronchitis: Secondary | ICD-10-CM | POA: Diagnosis not present

## 2020-01-11 DIAGNOSIS — J439 Emphysema, unspecified: Secondary | ICD-10-CM | POA: Diagnosis not present

## 2020-01-11 DIAGNOSIS — R0602 Shortness of breath: Secondary | ICD-10-CM

## 2020-01-11 NOTE — ED Provider Notes (Signed)
[place]   MRN: [id number] DOB: 04-04-1926  Subjective:   Donna Arias is a 86 y.o. female presenting for recheck on persistent cough and shortness of breath.  Patient was already seen by her PCP 2 days ago.  She was given a Depo-Medrol injection, prescribed Tessalon and doxycycline.  She was advised to try and obtain an x-ray on Monday if symptoms persisted.  Patient presents today with her daughter with significant concern about getting an x-ray she was unable to schedule I.  Today she had an increase in her difficulty with breathing, wheezing, cough.  She was given a nebulizer treatment and provided her with good relief.  Currently patient denies any shortness of breath, chest pain.  She still has intermittent cough.  Has a history of COPD.  Uses her inhalers consistently for this.   Current Facility-Administered Medications:    ipratropium-albuterol (DUONEB) 0.5-2.5 (3) MG/3ML nebulizer solution 3 mL, 3 mL, Nebulization, Q6H, Danford, Katy D, NP, 3 mL at 12/08/17 0958  Current Outpatient Medications:    albuterol (PROVENTIL HFA;VENTOLIN HFA) 108 (90 Base) MCG/ACT inhaler, Inhale 2 puffs into the lungs every 6 (six) hours as needed. (Patient not taking: No sig reported), Disp: 1 Inhaler, Rfl: 1   albuterol (PROVENTIL) (2.5 MG/3ML) 0.083% nebulizer solution, Take 3 mLs (2.5 mg total) by nebulization every 6 (six) hours as needed for wheezing or shortness of breath., Disp: 75 mL, Rfl: 6   amLODipine (NORVASC) 2.5 MG tablet, TAKE 1 TABLET (2.5 MG TOTAL) BY MOUTH DAILY., Disp: 90 tablet, Rfl: 0   aspirin 81 MG tablet, Take 81 mg by mouth daily., Disp: , Rfl:    atorvastatin (LIPITOR) 10 MG tablet, TAKE 1 TABLET BY MOUTH DAILY., Disp: 90 tablet, Rfl: 0   Baclofen 5 MG TABS, Take 1 tablet by mouth at bedtime as needed., Disp: 30 tablet, Rfl: 0   benzonatate (TESSALON) 100 MG capsule, Take 1 capsule (100 mg total) by mouth 3 (three) times daily as needed for cough., Disp:  30 capsule, Rfl: 0   Calcium Carbonate-Vit D-Min (CALCIUM 1200 PO), Take 1 tablet by mouth., Disp: , Rfl:    doxycycline (VIBRA-TABS) 100 MG tablet, Take 1 tablet (100 mg total) by mouth 2 (two) times daily., Disp: 14 tablet, Rfl: 0   FLUoxetine (PROZAC) 10 MG capsule, TAKE 1 CAPSULE BY MOUTH DAILY., Disp: 90 capsule, Rfl: 0   fluticasone (FLONASE) 50 MCG/ACT nasal spray, Place 1 spray into both nostrils 2 (two) times daily., Disp: 16 g, Rfl: 6   furosemide (LASIX) 20 MG tablet, Take 1 tablet (20 mg total) by mouth daily., Disp: 90 tablet, Rfl: 0   ketoconazole (NIZORAL) 2 % shampoo, Use as directed, Disp: 120 mL, Rfl: 1   levothyroxine (SYNTHROID) 25 MCG tablet, TAKE 1/2 TABLET BY MOUTH DAILY WITH 50 MCG TO EQUAL 62.5 MCG TOTAL DAILY, Disp: 45 tablet, Rfl: 0   levothyroxine (SYNTHROID) 50 MCG tablet, TAKE 1 TABLET BY MOUTH EVERY DAY, Disp: 90 tablet, Rfl: 0   loratadine (CLARITIN) 10 MG tablet, Take 1 tablet (10 mg total) by mouth daily., Disp: 90 tablet, Rfl: 1   potassium chloride SA (KLOR-CON) 20 MEQ tablet, TAKE 1 TABLET BY MOUTH ONCE DAILY., Disp: 90 tablet, Rfl: 0   predniSONE (DELTASONE) 20 MG tablet, Take 2 tablets PO x 2 days, then 1 tablet x 2 days, then 0.5 tablet x 2 days (Patient not taking: Reported on 01/09/2020), Disp: 7 tablet, Rfl: 0   risperiDONE (RISPERDAL) 1 MG tablet,  Take 1 tablet (1 mg total) by mouth 2 (two) times daily., Disp: 120 tablet, Rfl: 0   Spacer/Aero-Holding Dorise Bullion, 1 Product by Does not apply route as needed. (Patient not taking: Reported on 01/09/2020), Disp: 1 each, Rfl: 1   STIOLTO RESPIMAT 2.5-2.5 MCG/ACT AERS, INHALE 2 PUFFS INTO THE LUNGS DAILY, Disp: 4 g, Rfl: 1   triamcinolone cream (KENALOG) 0.1 %, Apply 1 application topically 2 (two) times daily. Request jar, Disp: 453 g, Rfl: 0   Allergies  Allergen Reactions   Codeine Sulfate     REACTION: unspecified   Picato [Ingenol Mebutate] Hives    Past Medical History:   Diagnosis Date   Cancer ([place])    squamous cell of left face   COPD (chronic obstructive pulmonary disease) (HCC)    Dementia (HCC)    Dementia (HCC)    GERD (gastroesophageal reflux disease)    Heart attack ([place]) 1998   Hypertension    Stroke ([place])    Thyroid disease      Past Surgical History:  Procedure Laterality Date   BREAST LUMPECTOMY  1995   Left Breast   DILATION AND CURETTAGE OF UTERUS  1975   [place]    Family History  Problem Relation Age of Onset   Hypertension Mother    Stroke Mother    Stroke Sister    Stroke Sister    Stroke Sister     Social History   Tobacco Use   Smoking status: Former Smoker   Smokeless tobacco: Never Used  Scientific laboratory technician Use: Never used  Substance Use Topics   Alcohol use: No   Drug use: No    ROS   Objective:   Vitals: BP 130/76 (BP Location: Left Arm)    Pulse 79    Temp 97.6 F (36.4 C) (Oral)    Resp 20    SpO2 95%   Physical Exam Constitutional:      General: She is not in acute distress.    Appearance: Normal appearance. She is well-developed. She is not ill-appearing, toxic-appearing or diaphoretic.  HENT:     Head: Normocephalic and atraumatic.     Nose: Nose normal.     Mouth/Throat:     Mouth: Mucous membranes are moist.  Eyes:     Extraocular Movements: Extraocular movements intact.     Pupils: Pupils are equal, round, and reactive to light.  Cardiovascular:     Rate and Rhythm: Normal rate and regular rhythm.     Pulses: Normal pulses.     Heart sounds: Normal heart sounds. No murmur heard. No friction rub. No gallop.   Pulmonary:     Effort: Pulmonary effort is normal. No respiratory distress.     Breath sounds: No stridor. No wheezing, rhonchi or rales.     Comments: Slightly decreased breath sounds.  Skin:    General: Skin is warm and dry.     Findings: No rash.  Neurological:     Mental Status: She is alert and oriented to person, place, and  time.  Psychiatric:        Mood and Affect: Mood normal.        Behavior: Behavior normal.        Thought Content: Thought content normal.        Judgment: Judgment normal.    DG Chest 2 View  Result Date: 01/11/2020 CLINICAL DATA:  86 year old female with a history of bronchitis EXAM: CHEST - 2  VIEW COMPARISON:  08/19/2016 FINDINGS: Cardiomediastinal silhouette unchanged in size and contour. No pneumothorax. No pleural effusion. No confluent airspace disease. Coarsened interstitial markings, similar to the comparison. Stigmata of emphysema, with increased retrosternal airspace, flattened hemidiaphragms, increased AP diameter, and hyperinflation on the AP view. No displaced fracture IMPRESSION: Chronic lung changes without evidence of acute cardiopulmonary disease Electronically Signed   By: Corrie Mckusick D.O.   On: 01/11/2020 15:46   Assessment and Plan :   PDMP not reviewed this encounter.  1. Chronic bronchitis, unspecified chronic bronchitis type ([place])   2. Cough   3. Shortness of breath   4. Wheezing    Chest x-ray reassuring. Continue current management of inhaler treatments, doxycycline, supportive care. Patient declines COVID vaccination. Counseled patient on potential for adverse effects with medications prescribed/recommended today, ER and return-to-clinic precautions discussed, patient verbalized understanding.    Jaynee Eagles, PA-C 01/11/20 1553

## 2020-01-11 NOTE — ED Triage Notes (Signed)
Per daughter pt was dx'd and tx'd for acute bronchitis by PCP on Wednesday and stated if she wasn't any better to go [place] radiology on Monday for a chest x-ray. States today pt has had increase cough, heavy breathing, and wheezing. States pt received a neb tx with some relief.

## 2020-01-23 ENCOUNTER — Other Ambulatory Visit: Payer: Self-pay | Admitting: Physician Assistant

## 2020-02-12 ENCOUNTER — Encounter (INDEPENDENT_AMBULATORY_CARE_PROVIDER_SITE_OTHER): Payer: Medicare HMO | Admitting: Ophthalmology

## 2020-02-17 ENCOUNTER — Other Ambulatory Visit: Payer: Self-pay | Admitting: Physician Assistant

## 2020-02-17 DIAGNOSIS — E785 Hyperlipidemia, unspecified: Secondary | ICD-10-CM

## 2020-02-17 DIAGNOSIS — E039 Hypothyroidism, unspecified: Secondary | ICD-10-CM

## 2020-02-17 DIAGNOSIS — I10 Essential (primary) hypertension: Secondary | ICD-10-CM

## 2020-02-17 DIAGNOSIS — Z0000 Encounter for general adult medical examination without abnormal findings: Secondary | ICD-10-CM

## 2020-02-18 ENCOUNTER — Other Ambulatory Visit: Payer: Medicare HMO

## 2020-02-18 ENCOUNTER — Other Ambulatory Visit: Payer: Self-pay

## 2020-02-18 DIAGNOSIS — E039 Hypothyroidism, unspecified: Secondary | ICD-10-CM

## 2020-02-18 DIAGNOSIS — I10 Essential (primary) hypertension: Secondary | ICD-10-CM

## 2020-02-18 DIAGNOSIS — Z0000 Encounter for general adult medical examination without abnormal findings: Secondary | ICD-10-CM | POA: Diagnosis not present

## 2020-02-18 DIAGNOSIS — E785 Hyperlipidemia, unspecified: Secondary | ICD-10-CM

## 2020-02-19 ENCOUNTER — Encounter (INDEPENDENT_AMBULATORY_CARE_PROVIDER_SITE_OTHER): Payer: Self-pay | Admitting: Ophthalmology

## 2020-02-19 ENCOUNTER — Ambulatory Visit (INDEPENDENT_AMBULATORY_CARE_PROVIDER_SITE_OTHER): Payer: Medicare HMO | Admitting: Ophthalmology

## 2020-02-19 DIAGNOSIS — H353211 Exudative age-related macular degeneration, right eye, with active choroidal neovascularization: Secondary | ICD-10-CM | POA: Diagnosis not present

## 2020-02-19 DIAGNOSIS — H353222 Exudative age-related macular degeneration, left eye, with inactive choroidal neovascularization: Secondary | ICD-10-CM | POA: Diagnosis not present

## 2020-02-19 DIAGNOSIS — H353124 Nonexudative age-related macular degeneration, left eye, advanced atrophic with subfoveal involvement: Secondary | ICD-10-CM | POA: Diagnosis not present

## 2020-02-19 LAB — LIPID PANEL
Chol/HDL Ratio: 2.2 ratio (ref 0.0–4.4)
Cholesterol, Total: 151 mg/dL (ref 100–199)
HDL: 69 mg/dL (ref >39)
LDL Chol Calc (NIH): 67 mg/dL (ref 0–99)
Triglycerides: 75 mg/dL (ref 0–149)
VLDL Cholesterol Cal: 15 mg/dL (ref 5–40)

## 2020-02-19 LAB — COMPREHENSIVE METABOLIC PANEL
ALT: 17 IU/L (ref 0–32)
AST: 18 IU/L (ref 0–40)
Albumin/Globulin Ratio: 1.7 (ref 1.2–2.2)
Albumin: 3.8 g/dL (ref 3.5–4.6)
Alkaline Phosphatase: 92 IU/L (ref 44–121)
BUN/Creatinine Ratio: 15 (ref 12–28)
BUN: 17 mg/dL (ref 10–36)
Bilirubin Total: 0.4 mg/dL (ref 0.0–1.2)
CO2: 23 mmol/L (ref 20–29)
Calcium: 10 mg/dL (ref 8.7–10.3)
Chloride: 109 mmol/L — ABNORMAL HIGH (ref 96–106)
Creatinine, Ser: 1.14 mg/dL — ABNORMAL HIGH (ref 0.57–1.00)
GFR calc Af Amer: 49 mL/min/{1.73_m2} — ABNORMAL LOW (ref >59)
GFR calc non Af Amer: 42 mL/min/{1.73_m2} — ABNORMAL LOW (ref >59)
Globulin, Total: 2.2 g/dL (ref 1.5–4.5)
Glucose: 105 mg/dL — ABNORMAL HIGH (ref 65–99)
Potassium: 3.9 mmol/L (ref 3.5–5.2)
Sodium: 146 mmol/L — ABNORMAL HIGH (ref 134–144)
Total Protein: 6 g/dL (ref 6.0–8.5)

## 2020-02-19 MED ORDER — BEVACIZUMAB 2.5 MG/0.1ML IZ SOSY
2.5000 mg | PREFILLED_SYRINGE | INTRAVITREAL | Status: AC | PRN
  Administered 2020-02-19: 2.5 mg via INTRAVITREAL

## 2020-02-19 NOTE — Patient Instructions (Signed)
Patient instructed to contact the office promptly for new onset visual acuity declines or distortions 

## 2020-02-19 NOTE — Progress Notes (Signed)
02/19/2020     CHIEF COMPLAINT Patient presents for Retina Follow Up (6 Week AMD F/U OD, poss Avastin OD//Pt sts VA OD is improving. Pt sts, "I'm feeling better." No new symptoms.)   HISTORY OF PRESENT ILLNESS: Donna Arias is a 87 y.o. female who presents to the clinic today for:   HPI    Retina Follow Up    Patient presents with  Wet AMD.  In right eye.  This started 6 weeks ago.  Severity is mild.  Duration of 6 weeks.  Since onset it is gradually improving. Additional comments: 6 Week AMD F/U OD, poss Avastin OD  Pt sts VA OD is improving. Pt sts, "I'm feeling better." No new symptoms.       Last edited by Rockie Neighbours, [place] on 02/19/2020  8:25 AM. (History)      Referring physician: Lorrene Reid, PA-C [place] [place],  [place] [address]  HISTORICAL INFORMATION:   Selected notes from the MEDICAL RECORD NUMBER    Lab Results  Component Value Date   HGBA1C 5.6 08/07/2019     CURRENT MEDICATIONS: No current outpatient medications on file. (Ophthalmic Drugs)   No current facility-administered medications for this visit. (Ophthalmic Drugs)   Current Outpatient Medications (Other)  Medication Sig  . albuterol (PROVENTIL HFA;VENTOLIN HFA) 108 (90 Base) MCG/ACT inhaler Inhale 2 puffs into the lungs every 6 (six) hours as needed. (Patient not taking: No sig reported)  . albuterol (PROVENTIL) (2.5 MG/3ML) 0.083% nebulizer solution Take 3 mLs (2.5 mg total) by nebulization every 6 (six) hours as needed for wheezing or shortness of breath.  [name] amLODipine (NORVASC) 2.5 MG tablet TAKE 1 TABLET (2.5 MG TOTAL) BY MOUTH DAILY.  [name] aspirin 81 MG tablet Take 81 mg by mouth daily.  [name] atorvastatin (LIPITOR) 10 MG tablet TAKE 1 TABLET BY MOUTH DAILY.  . Baclofen 5 MG TABS Take 1 tablet by mouth at bedtime as needed.  . benzonatate (TESSALON) 100 MG capsule Take 1 capsule (100 mg total) by mouth 3 (three) times daily as needed for cough.  . Calcium Carbonate-Vit  D-Min (CALCIUM 1200 PO) Take 1 tablet by mouth.  . doxycycline (VIBRA-TABS) 100 MG tablet Take 1 tablet (100 mg total) by mouth 2 (two) times daily.  [name] FLUoxetine (PROZAC) 10 MG capsule TAKE 1 CAPSULE BY MOUTH DAILY.  . fluticasone (FLONASE) 50 MCG/ACT nasal spray Place 1 spray into both nostrils 2 (two) times daily.  . furosemide (LASIX) 20 MG tablet Take 1 tablet (20 mg total) by mouth daily.  [name] ketoconazole (NIZORAL) 2 % shampoo Use as directed  . levothyroxine (SYNTHROID) 25 MCG tablet TAKE 1/2 TABLET BY MOUTH DAILY WITH 50 MCG TO EQUAL 62.5 MCG TOTAL DAILY  . levothyroxine (SYNTHROID) 50 MCG tablet TAKE 1 TABLET BY MOUTH EVERY DAY  . loratadine (CLARITIN) 10 MG tablet Take 1 tablet (10 mg total) by mouth daily.  . potassium chloride SA (KLOR-CON) 20 MEQ tablet TAKE 1 TABLET BY MOUTH ONCE DAILY.  [name] risperiDONE (RISPERDAL) 1 MG tablet Take 1 tablet (1 mg total) by mouth 2 (two) times daily.  [name] Spacer/Aero-Holding Chambers DEVI 1 Product by Does not apply route as needed. (Patient not taking: Reported on 01/09/2020)  . STIOLTO RESPIMAT 2.5-2.5 MCG/ACT AERS INHALE 2 PUFFS INTO THE LUNGS DAILY  . triamcinolone cream (KENALOG) 0.1 % Apply 1 application topically 2 (two) times daily. Request jar   Current Facility-Administered Medications (Other)  Medication Route  .  ipratropium-albuterol (DUONEB) 0.5-2.5 (3) MG/3ML nebulizer solution 3 mL Nebulization      REVIEW OF SYSTEMS:    ALLERGIES Allergies  Allergen Reactions  . Codeine Sulfate     REACTION: unspecified  . Picato [Ingenol Mebutate] Hives    PAST MEDICAL HISTORY Past Medical History:  Diagnosis Date  . Cancer (HCC)    squamous cell of left face  . COPD (chronic obstructive pulmonary disease) ([place])   . Dementia ([place])   . Dementia ([place])   . GERD (gastroesophageal reflux disease)   . Heart attack ([place]) 1998  . Hypertension   . Stroke ([place])   . Thyroid disease    Past Surgical History:  Procedure Laterality Date  .  BREAST LUMPECTOMY  1995   Left Breast  . DILATION AND CURETTAGE OF UTERUS  1975  . GALLBLADDER SURGERY  1993    FAMILY HISTORY Family History  Problem Relation Age of Onset  . Hypertension Mother   . Stroke Mother   . Stroke Sister   . Stroke Sister   . Stroke Sister     SOCIAL HISTORY Social History   Tobacco Use  . Smoking status: Former Research scientist (life sciences)  . Smokeless tobacco: Never Used  Vaping Use  . Vaping Use: Never used  Substance Use Topics  . Alcohol use: No  . Drug use: No         OPHTHALMIC EXAM: Base Eye Exam    Visual Acuity (ETDRS)      Right Left   Dist cc 20/40 +1 E card @ 3'   Dist ph cc NI NI   Correction: Glasses       Tonometry (Tonopen, 8:25 AM)      Right Left   Pressure 13 13       Pupils      Pupils Dark Light Shape React APD   Right PERRL 4 3 Round Brisk None   Left PERRL 4 3 Round Brisk None       Visual Fields (Counting fingers)      Left Right    Full Full       Extraocular Movement      Right Left    Full Full       Neuro/Psych    Oriented x3: Yes   Mood/Affect: Normal       Dilation    Right eye: 1.0% Mydriacyl, 2.5% Phenylephrine @ 8:29 AM        Slit Lamp and Fundus Exam    External Exam      Right Left   External Normal Normal       Slit Lamp Exam      Right Left   Lids/Lashes Normal Normal   Conjunctiva/Sclera White and quiet White and quiet   Cornea Clear Clear   Anterior Chamber Deep and quiet Deep and quiet   Iris Round and reactive Round and reactive   Lens Centered posterior chamber intraocular lens Centered posterior chamber intraocular lens   Anterior Vitreous Normal Normal       Fundus Exam      Right Left   Posterior Vitreous Posterior vitreous detachment    Disc Peripapillary atrophy    C/D Ratio 0.65    Macula Retinal pigment epithelial mottling, no macular thickening, Retinal atrophy, Early age related macular degeneration, no serous retinal detachment visible clinically, minor epiretinal  membrane on distorting    Vessels Normal    Periphery Normal  IMAGING AND PROCEDURES  Imaging and Procedures for 02/19/20  OCT, Retina - OU - Both Eyes       Right Eye Quality was good. Scan locations included subfoveal. Central Foveal Thickness: 244. Progression has improved. Findings include abnormal foveal contour.   Left Eye Quality was good. Scan locations included subfoveal. Central Foveal Thickness: 178. Progression has been stable.   Notes Less subretinal fluid, now resolved and perifoveal CME completely resolved as compared to findings November 2021. Currently at 6-week interval. Repeat injection Avastin today and examination again in 6 weeks to maintain  OS with macular atrophy from previous retinal vascular disease.  Stable overall       Intravitreal Injection, Pharmacologic Agent - OD - Right Eye       Time Out 02/19/2020. 9:40 AM. Confirmed correct patient, procedure, site, and patient consented.   Anesthesia Topical anesthesia was used. Anesthetic medications included [place] 3.5%.   Procedure Preparation included Ofloxacin , Tobramycin 0.3%, 10% betadine to eyelids, 5% betadine to ocular surface. A 30 gauge needle was used.   Injection:  2.5 mg Bevacizumab (AVASTIN) 2.5mg /0.52mL SOSY   NDC: 78588-502-77, Lot: [id number]   Route: Intravitreal, Site: Right Eye  Post-op Post injection exam found visual acuity of at least counting fingers. The patient tolerated the procedure well. There were no complications. The patient received written and verbal post procedure care education. Post injection medications were not given.                 ASSESSMENT/PLAN:  Exudative age-related macular degeneration of right eye with active choroidal neovascularization ([place]) Recent onset of CNVM with subretinal fluid and perifoveal intraretinal fluid November 2021 onset. Now vastly improved after 2 injections of intravitreal Avastin, currently at 6-week follow-up  interval. We will repeat injection today and ten 6-week follow-up for a period of time to preserve this monocular functioning patient  Exudative age-related macular degeneration of left eye with inactive choroidal neovascularization (HCC) No active disease by OCT today  Advanced nonexudative age-related macular degeneration of left eye with subfoveal involvement  Diffuse macular atrophy, stable      ICD-10-CM   1. Exudative age-related macular degeneration of right eye with active choroidal neovascularization (HCC)  H35.3211 OCT, Retina - OU - Both Eyes    Intravitreal Injection, Pharmacologic Agent - OD - Right Eye    bevacizumab (AVASTIN) SOSY 2.5 mg  2. Exudative age-related macular degeneration of left eye with inactive choroidal neovascularization ([place])  H35.3222   3. Advanced nonexudative age-related macular degeneration of left eye with subfoveal involvement  H35.3124     1. OD, with preserved visual acuity on intravitreal Avastin for CNVM, onset November 2021. Currently at 6-week follow-up repeat injection today OD  2. OS diffuse macular atrophy, no active therapy required  3.  Ophthalmic Meds Ordered this visit:  Meds ordered this encounter  Medications  . bevacizumab (AVASTIN) SOSY 2.5 mg       Return in about 6 weeks (around 04/01/2020) for dilate, OD, AVASTIN OCT.  There are no Patient Instructions on file for this visit.   Explained the diagnoses, plan, and follow up with the patient and they expressed understanding.  Patient expressed understanding of the importance of proper follow up care.   Clent Demark Toua Stites M.D. Diseases & Surgery of the Retina and Vitreous Retina & Diabetic [place] 02/19/20     Abbreviations: M myopia (nearsighted); A astigmatism; H hyperopia (farsighted); P presbyopia; Mrx spectacle prescription;  CTL contact lenses; OD right eye;  OS left eye; OU both eyes  XT exotropia; ET esotropia; PEK punctate epithelial keratitis; PEE punctate  epithelial erosions; DES dry eye syndrome; MGD meibomian gland dysfunction; ATs artificial tears; PFAT's preservative free artificial tears; [place] nuclear sclerotic cataract; PSC posterior subcapsular cataract; ERM epi-retinal membrane; PVD posterior vitreous detachment; RD retinal detachment; DM diabetes mellitus; DR diabetic retinopathy; NPDR non-proliferative diabetic retinopathy; PDR proliferative diabetic retinopathy; CSME clinically significant macular edema; DME diabetic macular edema; dbh dot blot hemorrhages; CWS cotton wool spot; POAG primary open angle glaucoma; C/D cup-to-disc ratio; HVF humphrey visual field; GVF goldmann visual field; OCT optical coherence tomography; IOP intraocular pressure; BRVO Branch retinal vein occlusion; CRVO central retinal vein occlusion; CRAO central retinal artery occlusion; BRAO branch retinal artery occlusion; RT retinal tear; SB scleral buckle; PPV pars plana vitrectomy; VH Vitreous hemorrhage; PRP panretinal laser photocoagulation; IVK intravitreal kenalog; VMT vitreomacular traction; MH Macular hole;  NVD neovascularization of the disc; NVE neovascularization elsewhere; AREDS age related eye disease study; ARMD age related macular degeneration; POAG primary open angle glaucoma; EBMD epithelial/anterior basement membrane dystrophy; ACIOL anterior chamber intraocular lens; IOL intraocular lens; PCIOL posterior chamber intraocular lens; Phaco/IOL phacoemulsification with intraocular lens placement; Pink photorefractive keratectomy; LASIK laser assisted in situ keratomileusis; HTN hypertension; DM diabetes mellitus; COPD chronic obstructive pulmonary disease

## 2020-02-19 NOTE — Assessment & Plan Note (Signed)
Recent onset of CNVM with subretinal fluid and perifoveal intraretinal fluid November 2021 onset. Now vastly improved after 2 injections of intravitreal Avastin, currently at 6-week follow-up interval. We will repeat injection today and ten 6-week follow-up for a period of time to preserve this monocular functioning patient

## 2020-02-19 NOTE — Assessment & Plan Note (Addendum)
Diffuse macular atrophy, stable

## 2020-02-19 NOTE — Assessment & Plan Note (Signed)
No active disease by OCT today

## 2020-03-03 ENCOUNTER — Other Ambulatory Visit: Payer: Self-pay | Admitting: Physician Assistant

## 2020-03-03 DIAGNOSIS — E039 Hypothyroidism, unspecified: Secondary | ICD-10-CM

## 2020-03-03 DIAGNOSIS — Z760 Encounter for issue of repeat prescription: Secondary | ICD-10-CM

## 2020-03-06 ENCOUNTER — Telehealth: Payer: Self-pay | Admitting: Physician Assistant

## 2020-03-06 DIAGNOSIS — J209 Acute bronchitis, unspecified: Secondary | ICD-10-CM

## 2020-03-06 DIAGNOSIS — J440 Chronic obstructive pulmonary disease with acute lower respiratory infection: Secondary | ICD-10-CM

## 2020-03-06 MED ORDER — BENZONATATE 100 MG PO CAPS: 100.0000 mg | ORAL_CAPSULE | Freq: Three times a day (TID) | ORAL | 0 refills | Status: DC | PRN

## 2020-03-06 NOTE — Telephone Encounter (Signed)
Patient daughter states patient has chills, cough, nasal congestion and sore throat. I suggested that patient be covid tested and possibly flu tested and [facility] declined. She requested an antibiotic and steroid. I advised we would not be able to treat patient without a visit and we have no available apts in our office today or tomorrow.   Per [facility] patient advised to use humidifier, warm liquids, cough suppressant, nebulizer and inhaler as needed.   Patient is also agreeable to Pulmonology referral.   Sending cough supressant to [place] per [place]. AS, CMA

## 2020-03-06 NOTE — Addendum Note (Signed)
Addended by: Mickel Crow on: 03/06/2020 11:42 AM   Modules accepted: Orders

## 2020-03-06 NOTE — Telephone Encounter (Signed)
Pt daughter advised if pts symptoms progress and get worse to take pt to UC for evaluation and treatment if we are unable to see pt. AS, CMA

## 2020-03-06 NOTE — Telephone Encounter (Signed)
Patient's daughter [name] called in for her mother Donna Arias. Patient is sick again and is almost 33. She had a cough and sneeze yesterday and woke up today with nasal congestion and was hoarse. Daughter is worried it might go into patient's lungs. Patient has taken prednisone before and has used an inhaler called something like nebulizer and has three tubes left for the inhaler. Please advise, thanks.

## 2020-03-08 ENCOUNTER — Other Ambulatory Visit: Payer: Self-pay

## 2020-03-08 ENCOUNTER — Ambulatory Visit
Admission: EM | Admit: 2020-03-08 | Discharge: 2020-03-08 | Disposition: A | Discharge status: Home / Self Care | Payer: Medicare HMO | Attending: Physician Assistant | Admitting: Physician Assistant

## 2020-03-08 ENCOUNTER — Encounter: Payer: Self-pay | Admitting: Emergency Medicine

## 2020-03-08 DIAGNOSIS — B9689 Other specified bacterial agents as the cause of diseases classified elsewhere: Secondary | ICD-10-CM | POA: Diagnosis not present

## 2020-03-08 DIAGNOSIS — J441 Chronic obstructive pulmonary disease with (acute) exacerbation: Secondary | ICD-10-CM

## 2020-03-08 DIAGNOSIS — H109 Unspecified conjunctivitis: Secondary | ICD-10-CM | POA: Diagnosis not present

## 2020-03-08 DIAGNOSIS — J209 Acute bronchitis, unspecified: Secondary | ICD-10-CM

## 2020-03-08 MED ORDER — DOXYCYCLINE HYCLATE 100 MG PO TABS: 100.0000 mg | ORAL_TABLET | Freq: Two times a day (BID) | ORAL | 0 refills | Status: DC

## 2020-03-08 MED ORDER — METHYLPREDNISOLONE ACETATE 40 MG/ML IJ SUSP
40.0000 mg | Freq: Once | INTRAMUSCULAR | Status: AC
  Administered 2020-03-08: 40 mg via INTRAMUSCULAR

## 2020-03-08 MED ORDER — ERYTHROMYCIN 5 MG/GM OP OINT: TOPICAL_OINTMENT | OPHTHALMIC | 0 refills | Status: DC

## 2020-03-08 NOTE — Discharge Instructions (Addendum)
Take medications as prescribed.   If symptoms worsen follow up in Emergency Department over the weekend.  Keep follow up with primary care on Monday.  Continue with nebulizer treatments at home as needed.

## 2020-03-08 NOTE — ED Triage Notes (Signed)
Pt here for cough and congestion diagnosed with bronchitis and given cough meds; pt still coughing over night with some productive cough; feels her right ear is stopped up; denies fever

## 2020-03-08 NOTE — ED Provider Notes (Signed)
[facility]    CSN: [id number] Arrival date & time: 03/08/20  1761      History   Chief Complaint Chief Complaint  Patient presents with  . Cough    HPI Donna Arias is a 87 y.o. female.   Pt brought in by daughter who reports she is experiencing an exacerbation of COPD.  She has had several exacerbations over the last few months.  Pt is experiencing cough, congestion.  Denies fever, chills, wheezing, shortness of breath.  She was prescribed tessalon pearls by PCP recently and reports that has helped somewhat.  Pt slept in recliner last night due to increased congestion and cough when lying flat.  Daughter also reports she has experienced bilateral eye redness and drainage.  Eyelids matted shut this morning.  PCP has placed referral to pulmonology.       Past Medical History:  Diagnosis Date  . Cancer (HCC)    squamous cell of left face  . COPD (chronic obstructive pulmonary disease) ([place])   . Dementia ([place])   . Dementia ([place])   . GERD (gastroesophageal reflux disease)   . Heart attack ([place]) 1998  . Hypertension   . Stroke ([place])   . Thyroid disease     Patient Active Problem List   Diagnosis Date Noted  . Exudative age-related macular degeneration of right eye with active choroidal neovascularization ([place]) 12/03/2019  . Exudative age-related macular degeneration of left eye with inactive choroidal neovascularization ([place]) 06/04/2019  . Posterior vitreous detachment of right eye 06/04/2019  . 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 Advanced nonexudative age-related macular degeneration of left eye with subfoveal involvement 06/04/2019  . Intermediate stage nonexudative age-related macular degeneration of right eye 06/04/2019  . Excessive cerumen in ear canal, bilateral 11/06/2018  . Eye infection, left 01/16/2018  . Ear fullness, left 01/16/2018  . Multiple atypical nevi 06/23/2017  . Ankle edema, bilateral 05/02/2017  . Postnasal drip  12/13/2016  . Acute bronchitis with COPD ([place]) 12/13/2016  . Orthostatic hypotension 11/22/2016  . Hyperlipidemia 11/22/2016  . Anxiety 11/22/2016  . Dysarthria 11/22/2016  . Weight gain 11/18/2016  . Edema of right foot 11/18/2016  . Age-related osteoporosis without current pathological fracture 11/16/2016  . Cough in adult 11/16/2016  . Healthcare maintenance 10/14/2016  . Diaper dermatitis 06/10/2016  . Antibiotic-induced yeast infection 06/10/2016  . Hearing loss secondary to cerumen impaction, bilateral 06/10/2016  . Chronic bilateral low back pain 04/19/2016  . Chronic neck pain 04/19/2016  . Medication refill 04/14/2016  . Joint laxity of right knee 04/14/2016  . Hypothyroidism 02/04/2016  . Upper back pain 08/01/2009  . Transient cerebral ischemia 09/22/2007  . Dementia with behavioral disturbance ([place]) 10/07/2006  . Essential hypertension 10/07/2006  . COPD (chronic obstructive pulmonary disease) ([place]) 10/07/2006  . GERD 10/07/2006    Past Surgical History:  Procedure Laterality Date  . BREAST LUMPECTOMY  1995   Left Breast  . DILATION AND CURETTAGE OF UTERUS  1975  . GALLBLADDER SURGERY  1993    OB History   No obstetric history on file.      Home Medications    Prior to Admission medications   Medication Sig Start Date End Date Taking? Authorizing Provider  erythromycin ophthalmic ointment Place a 1/2 inch ribbon of ointment into the lower eyelid. 03/08/20  Yes Laquentin Loudermilk, PA-C  albuterol (PROVENTIL HFA;VENTOLIN HFA) 108 (90 Base) MCG/ACT inhaler Inhale 2 puffs into the lungs every 6 (six) hours as needed. Patient  not taking: No sig reported 02/09/17   Danford, Valetta Fuller D, NP  albuterol (PROVENTIL) (2.5 MG/3ML) 0.083% nebulizer solution Take 3 mLs (2.5 mg total) by nebulization every 6 (six) hours as needed for wheezing or shortness of breath. 11/12/19   Abonza, Maritza, PA-C  amLODipine (NORVASC) 2.5 MG tablet TAKE 1 TABLET (2.5 MG TOTAL) BY MOUTH DAILY.  12/17/19   Lorrene Reid, PA-C  aspirin 81 MG tablet Take 81 mg by mouth daily.    [provider]  atorvastatin (LIPITOR) 10 MG tablet TAKE 1 TABLET BY MOUTH DAILY. 03/03/20   Lorrene Reid, PA-C  Baclofen 5 MG TABS Take 1 tablet by mouth at bedtime as needed. 10/12/18   Danford, Valetta Fuller D, NP  benzonatate (TESSALON) 100 MG capsule Take 1 capsule (100 mg total) by mouth 3 (three) times daily as needed for cough. 03/06/20   Lorrene Reid, PA-C  Calcium Carbonate-Vit D-Min (CALCIUM 1200 PO) Take 1 tablet by mouth.    [provider]  doxycycline (VIBRA-TABS) 100 MG tablet Take 1 tablet (100 mg total) by mouth 2 (two) times daily. 03/08/20   Konrad Felix, PA-C  FLUoxetine (PROZAC) 10 MG capsule TAKE 1 CAPSULE BY MOUTH DAILY. 12/17/19   Abonza, Herb Grays, PA-C  fluticasone (FLONASE) 50 MCG/ACT nasal spray Place 1 spray into both nostrils 2 (two) times daily. 10/12/18   Danford, Valetta Fuller D, NP  furosemide (LASIX) 20 MG tablet Take 1 tablet (20 mg total) by mouth daily. 11/12/19   Lorrene Reid, PA-C  ketoconazole (NIZORAL) 2 % shampoo Use as directed 10/08/19   Lorrene Reid, PA-C  levothyroxine (SYNTHROID) 25 MCG tablet TAKE 1/2 TABLET BY MOUTH DAILY WITH 50 MCG TO EQUAL 62.5 MCG TOTAL DAILY 12/10/19   Lorrene Reid, PA-C  levothyroxine (SYNTHROID) 50 MCG tablet TAKE 1 TABLET BY MOUTH EVERY DAY 03/03/20   Abonza, Maritza, PA-C  loratadine (CLARITIN) 10 MG tablet Take 1 tablet (10 mg total) by mouth daily. 10/12/18   Danford, Valetta Fuller D, NP  potassium chloride SA (KLOR-CON) 20 MEQ tablet TAKE 1 TABLET BY MOUTH ONCE DAILY. 03/03/20   Lorrene Reid, PA-C  risperiDONE (RISPERDAL) 1 MG tablet Take 1 tablet (1 mg total) by mouth 2 (two) times daily. 11/20/19   Lorrene Reid, PA-C  Spacer/Aero-Holding Chambers DEVI 1 Product by Does not apply route as needed. Patient not taking: Reported on 01/09/2020 12/08/17   Mina Marble D, NP  STIOLTO RESPIMAT 2.5-2.5 MCG/ACT AERS INHALE 2 PUFFS INTO THE  LUNGS DAILY 01/23/20   Lorrene Reid, PA-C  triamcinolone cream (KENALOG) 0.1 % Apply 1 application topically 2 (two) times daily. Request jar 10/12/18   Esaw Grandchild, NP    Family History Family History  Problem Relation Age of Onset  . Hypertension Mother   . Stroke Mother   . Stroke Sister   . Stroke Sister   . Stroke Sister     Social History Social History   Tobacco Use  . Smoking status: Former Research scientist (life sciences)  . Smokeless tobacco: Never Used  Vaping Use  . Vaping Use: Never used  Substance Use Topics  . Alcohol use: No  . Drug use: No     Allergies   Codeine sulfate and Picato [ingenol mebutate]   Review of Systems Review of Systems  Constitutional: Negative for chills and fever.  HENT: Positive for congestion. Negative for ear pain and sore throat.   Eyes: Positive for discharge and redness. Negative for pain and visual disturbance.  Respiratory: Positive for cough. Negative for shortness  of breath and wheezing.   Cardiovascular: Negative for chest pain and palpitations.  Gastrointestinal: Negative for abdominal pain and vomiting.  Genitourinary: Negative for dysuria and hematuria.  Musculoskeletal: Negative for arthralgias and back pain.  Skin: Negative for color change and rash.  Neurological: Negative for seizures and syncope.  All other systems reviewed and are negative.    Physical Exam Triage Vital Signs ED Triage Vitals [03/08/20 0824]  Enc Vitals Group     BP (!) 145/76     Pulse Rate 79     Resp 18     Temp 98.6 F (37 C)     Temp Source Oral     SpO2 95 %     Weight      Height      Head Circumference      Peak Flow      Pain Score 2     Pain Loc      Pain Edu?      Excl. in [place]?    No data found.  Updated Vital Signs BP (!) 145/76 (BP Location: Left Arm)   Pulse 79   Temp 98.6 F (37 C) (Oral)   Resp 18   SpO2 95%   Visual Acuity Right Eye Distance:   Left Eye Distance:   Bilateral Distance:    Right Eye Near:   Left Eye  Near:    Bilateral Near:     Physical Exam Vitals and nursing note reviewed.  Constitutional:      General: She is not in acute distress.    Appearance: She is well-developed and well-nourished.  HENT:     Head: Normocephalic and atraumatic.  Eyes:     Conjunctiva/sclera:     Right eye: Right conjunctiva is injected (discharge noted with crusting to eyelids).     Left eye: Left conjunctiva is injected (discharge noted with crusting to eyelids).  Cardiovascular:     Rate and Rhythm: Normal rate and regular rhythm.     Heart sounds: No murmur heard.   Pulmonary:     Effort: Pulmonary effort is normal. No respiratory distress.     Breath sounds: Normal breath sounds.  Abdominal:     Palpations: Abdomen is soft.     Tenderness: There is no abdominal tenderness.  Musculoskeletal:        General: No edema.     Cervical back: Neck supple.  Skin:    General: Skin is warm and dry.  Neurological:     Mental Status: She is alert.  Psychiatric:        Mood and Affect: Mood and affect normal.      UC Treatments / Results  Labs (all labs ordered are listed, but only abnormal results are displayed) Labs Reviewed - No data to display  EKG   Radiology No results found.  Procedures Procedures (including critical care time)  Medications Ordered in UC Medications  methylPREDNISolone acetate (DEPO-MEDROL) injection 40 mg (40 mg Intramuscular Given 03/08/20 0902)    Initial Impression / Assessment and Plan / UC Course  I have reviewed the triage vital signs and the nursing notes.  Pertinent labs & imaging results that were available during my care of the patient were reviewed by me and considered in my medical decision making (see chart for details).     Defers COVID or flu testing today.  Sx consistent with COPD exacerbation.  Lungs clear, O2 95%, pt in no respiratory distress.  Strict return precautions given.  Pt will continue with tessalon as needed.  Depo-medrol given in  clinic today.  Daughter reports PCP gives this and it provides significant relief.  Doxycycline prescribed.  She will keep her televisit with PCP on Monday.   Final Clinical Impressions(s) / UC Diagnoses   Final diagnoses:  COPD exacerbation ([place])  Bacterial conjunctivitis of both eyes     Discharge Instructions     Take medications as prescribed.   If symptoms worsen follow up in Emergency Department over the weekend.  Keep follow up with primary care on Monday.  Continue with nebulizer treatments at home as needed.    ED Prescriptions    Medication Sig Dispense Auth. Provider   doxycycline (VIBRA-TABS) 100 MG tablet Take 1 tablet (100 mg total) by mouth 2 (two) times daily. 14 tablet Salia Cangemi, PA-C   erythromycin ophthalmic ointment Place a 1/2 inch ribbon of ointment into the lower eyelid. 3.5 g Konrad Felix, PA-C     PDMP not reviewed this encounter.   Konrad Felix, PA-C 03/08/20 [phone]

## 2020-03-10 ENCOUNTER — Encounter: Payer: Self-pay | Admitting: Physician Assistant

## 2020-03-10 ENCOUNTER — Ambulatory Visit (INDEPENDENT_AMBULATORY_CARE_PROVIDER_SITE_OTHER): Payer: Medicare HMO | Admitting: Physician Assistant

## 2020-03-10 VITALS — BP 147/112 | HR 81 | Ht 64.0 in | Wt 157.0 lb

## 2020-03-10 DIAGNOSIS — M549 Dorsalgia, unspecified: Secondary | ICD-10-CM

## 2020-03-10 DIAGNOSIS — R3915 Urgency of urination: Secondary | ICD-10-CM

## 2020-03-10 DIAGNOSIS — J441 Chronic obstructive pulmonary disease with (acute) exacerbation: Secondary | ICD-10-CM | POA: Diagnosis not present

## 2020-03-10 LAB — POCT URINALYSIS DIPSTICK
Bilirubin, UA: NEGATIVE
Blood, UA: NEGATIVE
Glucose, UA: NEGATIVE
Ketones, UA: NEGATIVE
Leukocytes, UA: NEGATIVE
Nitrite, UA: NEGATIVE
Protein, UA: NEGATIVE
Spec Grav, UA: 1.01 (ref 1.010–1.025)
Urobilinogen, UA: 0.2 E.U./dL
pH, UA: 6 (ref 5.0–8.0)

## 2020-03-10 NOTE — Patient Instructions (Signed)
Chronic Obstructive Pulmonary Disease  Chronic obstructive pulmonary disease (COPD) is a long-term (chronic) lung problem. When you have COPD, it is hard for air to get in and out of your lungs. Usually the condition gets worse over time, and your lungs will never return to normal. There are things you can do to keep yourself as healthy as possible. What are the causes?  Smoking. This is the most common cause.  Certain genes passed from parent to child (inherited). What increases the risk?  Being exposed to secondhand smoke from cigarettes, pipes, or cigars.  Being exposed to chemicals and other irritants, such as fumes and dust in the work environment.  Having chronic lung conditions or infections. What are the signs or symptoms?  Shortness of breath, especially during physical activity.  A long-term cough with a large amount of thick mucus. Sometimes, the cough may not have any mucus (dry cough).  Wheezing.  Breathing quickly.  Skin that looks gray or blue, especially in the fingers, toes, or lips.  Feeling tired (fatigue).  Weight loss.  Chest tightness.  Having infections often.  Episodes when breathing symptoms become much worse (exacerbations). At the later stages of this disease, you may have swelling in the ankles, feet, or legs. How is this treated?  Taking medicines.  Quitting smoking, if you smoke.  Rehabilitation. This includes steps to make your body work better. It may involve a team of specialists.  Doing exercises.  Making changes to your diet.  Using oxygen.  Lung surgery.  Lung transplant.  Comfort measures (palliative care). Follow these instructions at home: Medicines  Take over-the-counter and prescription medicines only as told by your doctor.  Talk to your doctor before taking any cough or allergy medicines. You may need to avoid medicines that cause your lungs to be dry. Lifestyle  If you smoke, stop smoking. Smoking makes the  problem worse.  Do not smoke or use any products that contain nicotine or tobacco. If you need help quitting, ask your doctor.  Avoid being around things that make your breathing worse. This may include smoke, chemicals, and fumes.  Stay active, but remember to rest as well.  Learn and use tips on how to manage stress and control your breathing.  Make sure you get enough sleep. Most adults need at least 7 hours of sleep every night.  Eat healthy foods. Eat smaller meals more often. Rest before meals. Controlled breathing Learn and use tips on how to control your breathing as told by your doctor. Try:  Breathing in (inhaling) through your nose for 1 second. Then, pucker your lips and breath out (exhale) through your lips for 2 seconds.  Putting one hand on your belly (abdomen). Breathe in slowly through your nose for 1 second. Your hand on your belly should move out. Pucker your lips and breathe out slowly through your lips. Your hand on your belly should move in as you breathe out.   Controlled coughing Learn and use controlled coughing to clear mucus from your lungs. Follow these steps: 1. Lean your head a little forward. 2. Breathe in deeply. 3. Try to hold your breath for 3 seconds. 4. Keep your mouth slightly open while coughing 2 times. 5. Spit any mucus out into a tissue. 6. Rest and do the steps again 1 or 2 times as needed. General instructions  Make sure you get all the shots (vaccines) that your doctor recommends. Ask your doctor about a flu shot and a pneumonia shot.    Use oxygen therapy and pulmonary rehabilitation if told by your doctor. If you need home oxygen therapy, ask your doctor if you should buy a tool to measure your oxygen level (oximeter).  Make a COPD action plan with your doctor. This helps you to know what to do if you feel worse than usual.  Manage any other conditions you have as told by your doctor.  Avoid going outside when it is very hot, cold, or  humid.  Avoid people who have a sickness you can catch (contagious).  Keep all follow-up visits. Contact a doctor if:  You cough up more mucus than usual.  There is a change in the color or thickness of the mucus.  It is harder to breathe than usual.  Your breathing is faster than usual.  You have trouble sleeping.  You need to use your medicines more often than usual.  You have trouble doing your normal activities such as getting dressed or walking around the house. Get help right away if:  You have shortness of breath while resting.  You have shortness of breath that stops you from: ? Being able to talk. ? Doing normal activities.  Your chest hurts for longer than 5 minutes.  Your skin color is more blue than usual.  Your pulse oximeter shows that you have low oxygen for longer than 5 minutes.  You have a fever.  You feel too tired to breathe normally. These symptoms may represent a serious problem that is an emergency. Do not wait to see if the symptoms will go away. Get medical help right away. Call your local emergency services (911 in the [place] Do not drive yourself to the hospital. Summary  Chronic obstructive pulmonary disease (COPD) is a long-term lung problem.  The way your lungs work will never return to normal. Usually the condition gets worse over time. There are things you can do to keep yourself as healthy as possible.  Take over-the-counter and prescription medicines only as told by your doctor.  If you smoke, stop. Smoking makes the problem worse. This information is not intended to replace advice given to you by your health care provider. Make sure you discuss any questions you have with your health care provider. Document Revised: 11/20/2019 Document Reviewed: 11/20/2019 Elsevier Patient Education  2021 [employer]   

## 2020-03-10 NOTE — Progress Notes (Signed)
Telehealth office visit note for Donna Reid, PA-C- at Primary Care at [facility]   I connected with current patient today by telephone and verified that I am speaking with the correct person   . Location of the patient: Home . Location of the provider: Office - This visit type was conducted due to national recommendations for restrictions regarding the COVID-19 Pandemic (e.g. social distancing) in an effort to limit this patient's exposure and mitigate transmission in our community.    - No physical exam could be performed with this format, beyond that communicated to [name] by the patient/ family members as noted.   - Additionally my office staff/ schedulers were to discuss with the patient that there may be a monetary charge related to this service, depending on their medical insurance.  My understanding is that patient understood and consented to proceed.     _________________________________________________________________________________   History of Present Illness: Patient calls in to follow-up on COPD exacerbation.  Patient's daughter-Donna Arias is also on the line and providing history.  Patient went to urgent care 03/08/2020 and was treated for bacterial conjunctivitis and COPD exacerbation.  Taking doxycycline as instructed and applying erythromycin ointment.  Patient continues to endorse cough, sneezing, nasal congestion, fatigue and malaise.  Patient's daughter reports she did not urinate since Friday until yesterday.  Patient states has been staying hydrated with water, cranberry juice and apple juice.  Denies dysuria, abdominal pain or hematuria.  Reports intermittent urinary urgency and back pain.  Denies fever.  Patient's daughter reports Donna Arias have not been as helpful as in the past.  Is concerned about what to do before pulmonology appointment.     GAD 7 : Generalized Anxiety Score 08/23/2016  Nervous, Anxious, on Edge 0  Control/stop worrying 0  Worry too much  - different things 0  Trouble relaxing 0  Restless 0  Easily annoyed or irritable 0  Afraid - awful might happen 0  Total GAD 7 Score 0  Anxiety Difficulty Not difficult at all    Depression screen [facility] 2/9 01/09/2020 08/14/2019 05/21/2019 02/12/2019 11/06/2018  Decreased Interest 0 1 0 0 0  Down, Depressed, Hopeless 0 0 0 0 0  PHQ - 2 Score 0 1 0 0 0  Altered sleeping 1 1 0 0 0  Tired, decreased energy 1 0 1 1 0  Change in appetite 1 0 0 0 0  Feeling bad or failure about yourself  0 0 0 0 0  Trouble concentrating 0 0 0 0 0  Moving slowly or fidgety/restless 0 0 0 0 0  Suicidal thoughts 0 0 0 0 0  PHQ-9 Score 3 2 1 1  0  Difficult doing work/chores Somewhat difficult Not difficult at all Not difficult at all Not difficult at all -  Some recent data might be hidden      Impression and Recommendations:     1. COPD exacerbation ([place])   2. Urinary urgency   3. Acute back pain, unspecified back location, unspecified back pain laterality     COPD exacerbation:  -Discussed with patient and daughter to continue doxycycline, nebulizer and albuterol inhaler as needed.  Discussed to complete antibiotic for 7 days as instructed and do not recommend repeating antibiotic therapy, cough likely related to COPD.  Will defer additional management to pulmonology. Reviewed CXR 01/11/2020 which revealed coarsened interstitial markings and stigmata of emphysema, with increased retrosternal airspace, flattened diaphragms, increased AP diameter and hyperinflation. -Reviewed UC consult, patient  was administered IM depo-medrol 40 mg in additional to prescriptions. -Continue erythromycin ointment for conjunctivitis.  -Monitor for worsening symptoms. -Continue Stiolto inhaler.  Urinary urgency, Acute back pain: -Recommend to collect urine sample to evaluate for UTI. Will come by the office to pick up supplies. Advised to collect urine sample same day. Will send for urine culture. If no signs of UTI, will  defer starting empiric antibiotic therapy until urine culture results.  -Continue to stay well hydrated and drink cranberry juice.   - As part of my medical decision making, I reviewed the following data within the [place] History obtained from pt /family, CMA notes reviewed and incorporated if applicable, Labs reviewed, Radiograph/ tests reviewed if applicable and OV notes from prior OV's with me, as well as any other specialists she/he has seen since seeing me last, were all reviewed and used in my medical decision making process today.    - Additionally, when appropriate, discussion had with patient regarding our treatment plan, and their biases/concerns about that plan were used in my medical decision making today.    - The patient agreed with the plan and demonstrated an understanding of the instructions.   No barriers to understanding were identified.     - The patient was advised to call back or seek an in-person evaluation if the symptoms worsen or if the condition fails to improve as anticipated.   Return if symptoms worsen or fail to improve.    Orders Placed This Encounter  Procedures  . Urine Culture  . POCT urinalysis dipstick    No orders of the defined types were placed in this encounter.   There are no discontinued medications.     Time spent on telephone encounter was 13 minutes.      The [place] was signed into law in 2016 which includes the topic of electronic health records.  This provides immediate access to information in MyChart.  This includes consultation notes, operative notes, office notes, lab results and pathology reports.  If you have any questions about what you read please let us know at your next visit or call us at the office.  We are right here with you.   __________________________________________________________________________________     Patient Care Team    Relationship Specialty Notifications Start  End  Donna Arias, [place] PCP - General Physician Assistant  06/04/19      -Vitals obtained; medications/ allergies reconciled;  personal medical, social, Sx etc.histories were updated by CMA, reviewed by me and are reflected in chart   Patient Active Problem List   Diagnosis Date Noted  . Exudative age-related macular degeneration of right eye with active choroidal neovascularization ([place]) 12/03/2019  . Exudative age-related macular degeneration of left eye with inactive choroidal neovascularization ([place]) 06/04/2019  . Posterior vitreous detachment of right eye 06/04/2019  . 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 Advanced nonexudative age-related macular degeneration of left eye with subfoveal involvement 06/04/2019  . Intermediate stage nonexudative age-related macular degeneration of right eye 06/04/2019  . Excessive cerumen in ear canal, bilateral 11/06/2018  . Eye infection, left 01/16/2018  . Ear fullness, left 01/16/2018  . Multiple atypical nevi 06/23/2017  . Ankle edema, bilateral 05/02/2017  . Postnasal drip 12/13/2016  . Acute bronchitis with COPD ([place]) 12/13/2016  . Orthostatic hypotension 11/22/2016  . Hyperlipidemia 11/22/2016  . Anxiety 11/22/2016  . Dysarthria 11/22/2016  . Weight gain 11/18/2016  . Edema of right foot 11/18/2016  .  Age-related osteoporosis without current pathological fracture 11/16/2016  . Cough in adult 11/16/2016  . Healthcare maintenance 10/14/2016  . Diaper dermatitis 06/10/2016  . Antibiotic-induced yeast infection 06/10/2016  . Hearing loss secondary to cerumen impaction, bilateral 06/10/2016  . Chronic bilateral low back pain 04/19/2016  . Chronic neck pain 04/19/2016  . Medication refill 04/14/2016  . Joint laxity of right knee 04/14/2016  . Hypothyroidism 02/04/2016  . Upper back pain 08/01/2009  . Transient cerebral ischemia 09/22/2007  . Dementia with behavioral disturbance ([place]) 10/07/2006  . Essential hypertension  10/07/2006  . COPD (chronic obstructive pulmonary disease) ([place]) 10/07/2006  . GERD 10/07/2006     Current Meds  Medication Sig  . albuterol (PROVENTIL HFA;VENTOLIN HFA) 108 (90 Base) MCG/ACT inhaler Inhale 2 puffs into the lungs every 6 (six) hours as needed.  [name] albuterol (PROVENTIL) (2.5 MG/3ML) 0.083% nebulizer solution Take 3 mLs (2.5 mg total) by nebulization every 6 (six) hours as needed for wheezing or shortness of breath.  [name] amLODipine (NORVASC) 2.5 MG tablet TAKE 1 TABLET (2.5 MG TOTAL) BY MOUTH DAILY.  [name] aspirin 81 MG tablet Take 81 mg by mouth daily.  [name] atorvastatin (LIPITOR) 10 MG tablet TAKE 1 TABLET BY MOUTH DAILY.  . Baclofen 5 MG TABS Take 1 tablet by mouth at bedtime as needed.  . benzonatate (TESSALON) 100 MG capsule Take 1 capsule (100 mg total) by mouth 3 (three) times daily as needed for cough.  . Calcium Carbonate-Vit D-Min (CALCIUM 1200 PO) Take 1 tablet by mouth.  . doxycycline (VIBRA-TABS) 100 MG tablet Take 1 tablet (100 mg total) by mouth 2 (two) times daily.  [name] erythromycin ophthalmic ointment Place a 1/2 inch ribbon of ointment into the lower eyelid.  [name] FLUoxetine (PROZAC) 10 MG capsule TAKE 1 CAPSULE BY MOUTH DAILY.  . fluticasone (FLONASE) 50 MCG/ACT nasal spray Place 1 spray into both nostrils 2 (two) times daily.  . furosemide (LASIX) 20 MG tablet Take 1 tablet (20 mg total) by mouth daily.  [name] ketoconazole (NIZORAL) 2 % shampoo Use as directed  . levothyroxine (SYNTHROID) 25 MCG tablet TAKE 1/2 TABLET BY MOUTH DAILY WITH 50 MCG TO EQUAL 62.5 MCG TOTAL DAILY  . levothyroxine (SYNTHROID) 50 MCG tablet TAKE 1 TABLET BY MOUTH EVERY DAY  . loratadine (CLARITIN) 10 MG tablet Take 1 tablet (10 mg total) by mouth daily.  . potassium chloride SA (KLOR-CON) 20 MEQ tablet TAKE 1 TABLET BY MOUTH ONCE DAILY.  [name] risperiDONE (RISPERDAL) 1 MG tablet Take 1 tablet (1 mg total) by mouth 2 (two) times daily.  [name] Spacer/Aero-Holding Chambers DEVI 1 Product by Does not apply  route as needed.  [name] STIOLTO RESPIMAT 2.5-2.5 MCG/ACT AERS INHALE 2 PUFFS INTO THE LUNGS DAILY  . triamcinolone cream (KENALOG) 0.1 % Apply 1 application topically 2 (two) times daily. Request jar   Current Facility-Administered Medications for the 03/10/20 encounter (Office Visit) with Donna Reid, PA-C  Medication  . ipratropium-albuterol (DUONEB) 0.5-2.5 (3) MG/3ML nebulizer solution 3 mL     Allergies:  Allergies  Allergen Reactions  . Codeine Sulfate     REACTION: unspecified  . Picato [Ingenol Mebutate] Hives     ROS:  See above HPI for pertinent positives and negatives   Objective:   Blood pressure (!) 147/112, pulse 81, height 5\' 4"  (1.626 m), weight 157 lb (71.2 kg).  (if some vitals are omitted, this means that patient was UNABLE to obtain them.) General: A & O * 3; sounds in no  acute distress Respiratory: speaking in full sentences, no conversational dyspnea Psych: insight appears good, mood- appears full

## 2020-03-11 ENCOUNTER — Telehealth: Payer: Self-pay | Admitting: Physician Assistant

## 2020-03-11 NOTE — Telephone Encounter (Signed)
Patient's daughter called in stating she spoke with Colletta Maryland our office supervisor about a referral for patient to pulmonary. She would like the referral sent to [place] which is across from [facility]. [name] is who called and that is her daughter, thanks.

## 2020-03-12 ENCOUNTER — Other Ambulatory Visit: Payer: Self-pay | Admitting: Physician Assistant

## 2020-03-12 ENCOUNTER — Telehealth: Payer: Self-pay | Admitting: Physician Assistant

## 2020-03-12 DIAGNOSIS — J441 Chronic obstructive pulmonary disease with (acute) exacerbation: Secondary | ICD-10-CM

## 2020-03-12 DIAGNOSIS — J440 Chronic obstructive pulmonary disease with acute lower respiratory infection: Secondary | ICD-10-CM

## 2020-03-12 DIAGNOSIS — J209 Acute bronchitis, unspecified: Secondary | ICD-10-CM

## 2020-03-12 NOTE — Telephone Encounter (Signed)
left message to call office- pulmonary referral has been added. thank you

## 2020-03-14 ENCOUNTER — Other Ambulatory Visit: Payer: Self-pay | Admitting: Physician Assistant

## 2020-03-14 DIAGNOSIS — R3915 Urgency of urination: Secondary | ICD-10-CM

## 2020-03-14 DIAGNOSIS — N3090 Cystitis, unspecified without hematuria: Secondary | ICD-10-CM

## 2020-03-14 LAB — URINE CULTURE

## 2020-03-14 MED ORDER — AMOXICILLIN-POT CLAVULANATE 500-125 MG PO TABS: 1.0000 | ORAL_TABLET | Freq: Two times a day (BID) | ORAL | 0 refills | Status: DC

## 2020-03-17 ENCOUNTER — Other Ambulatory Visit: Payer: Self-pay | Admitting: Physician Assistant

## 2020-03-20 ENCOUNTER — Other Ambulatory Visit: Payer: Self-pay

## 2020-03-20 ENCOUNTER — Encounter: Payer: Self-pay | Admitting: Internal Medicine

## 2020-03-20 ENCOUNTER — Other Ambulatory Visit: Payer: Self-pay | Admitting: Internal Medicine

## 2020-03-20 ENCOUNTER — Ambulatory Visit: Payer: Medicare HMO | Admitting: Internal Medicine

## 2020-03-20 VITALS — BP 124/72 | HR 63 | Temp 97.3°F | Ht 64.0 in | Wt 156.4 lb

## 2020-03-20 DIAGNOSIS — J329 Chronic sinusitis, unspecified: Secondary | ICD-10-CM

## 2020-03-20 DIAGNOSIS — R0989 Other specified symptoms and signs involving the circulatory and respiratory systems: Secondary | ICD-10-CM

## 2020-03-20 DIAGNOSIS — J40 Bronchitis, not specified as acute or chronic: Secondary | ICD-10-CM | POA: Diagnosis not present

## 2020-03-20 DIAGNOSIS — R1310 Dysphagia, unspecified: Secondary | ICD-10-CM

## 2020-03-20 LAB — CBC WITH DIFFERENTIAL/PLATELET
Basophils Absolute: 0.1 10*3/uL (ref 0.0–0.1)
Basophils Relative: 0.7 % (ref 0.0–3.0)
Eosinophils Absolute: 0.2 10*3/uL (ref 0.0–0.7)
Eosinophils Relative: 2.8 % (ref 0.0–5.0)
HCT: 38.2 % (ref 36.0–46.0)
Hemoglobin: 12.7 g/dL (ref 12.0–15.0)
Lymphocytes Relative: 19 % (ref 12.0–46.0)
Lymphs Abs: 1.3 10*3/uL (ref 0.7–4.0)
MCHC: 33.3 g/dL (ref 30.0–36.0)
MCV: 94.3 fl (ref 78.0–100.0)
Monocytes Absolute: 0.5 10*3/uL (ref 0.1–1.0)
Monocytes Relative: 6.5 % (ref 3.0–12.0)
Neutro Abs: 5 10*3/uL (ref 1.4–7.7)
Neutrophils Relative %: 71 % (ref 43.0–77.0)
Platelets: 324 10*3/uL (ref 150.0–400.0)
RBC: 4.05 Mil/uL (ref 3.87–5.11)
RDW: 16.6 % — ABNORMAL HIGH (ref 11.5–15.5)
WBC: 7.1 10*3/uL (ref 4.0–10.5)

## 2020-03-20 NOTE — Patient Instructions (Signed)
ICD-10-CM   1. Chronic sinusitis with recurrent bronchitis  J32.9    J40   2. Bibasilar crackles  R09.89   3. Dysphagia, unspecified type  R13.10      Plan  - do CT sinus without contrast  - do HRCT supine and prone  - do cbc with diff, and blood IgE   - ask PCP Abonza, Maritza, PA-C to refer you for swallowing evaluation  Followup  - next few to several weeks with APP but after completing above

## 2020-03-20 NOTE — Progress Notes (Signed)
OV 03/20/2020  Subjective:  Patient ID: Donna Arias, female , DOB: [birth year] , age 87 y.o. , MRN: [id number] , ADDRESS: [place] [address] PCP Lorrene Reid, PA-C Patient Care Team: Lorrene Reid, PA-C as PCP - General (Physician Assistant)  This Provider for this visit: Treatment Team:  Attending Provider: Brand Males, MD    03/20/2020 -   Chief Complaint  Patient presents with  . Consult    COPD     HPI Donna Arias 87 y.o. -new consultation referred by her primary care nurse practitioner/physician assistant.  Presents with her daughter who gives most of the history.  Patient at baseline is ECOG 4-3.  Is she is mobile between her bed and sofa in the bathroom but only with assist.  She is cognitively intact but physically debilitated because of her age.  She is chronic arthritis.  The daughter tells me that for the last 7 years patient gets recurrent bronchitis 2 times a year in the last 4 to 5 years this is needed antibiotics and prednisone.  Then for a year possibly the year before the pandemic of the year after the onset of the pandemic patient was fine without any respiratory exacerbations but then in the last 3 or 4 months she has had several exacerbations each lasting 6 to 8 weeks and then has recurrence.  They describe cough wheezing and sputum production requiring antibiotics and prednisone.  This is because significant distress to the patient and her daughter and therefore they are here for consultation.  Patient is on Stiolto but she gets exacerbations despite this.  There is a new history of dysphagia and the daughter use the word "asphyxiation" to describe the swallowing difficulties.  This has not been evaluated.  According to the daughter cardiac condition is pretty good.  The daughter is suspicious that the patient might have seasonal allergies.  There is also chronic sinus drainage in the last few months.  Recent chest x-ray just  showed nonspecific changes personally visualized.   CXR -  IMPRESSION: Chronic lung changes without evidence of acute cardiopulmonary disease   Electronically Signed   By: Corrie Mckusick D.O.   On: 01/11/2020 15:46   PFT  No flowsheet data found.     has a past medical history of Cancer ([place]), COPD (chronic obstructive pulmonary disease) ([place]), Dementia ([place]), Dementia ([place]), GERD (gastroesophageal reflux disease), Heart attack ([place]) (1998), Hypertension, Stroke ([place]), and Thyroid disease.   reports that she has quit smoking. She has never used smokeless tobacco.  Past Surgical History:  Procedure Laterality Date  . BREAST LUMPECTOMY  1995   Left Breast  . DILATION AND CURETTAGE OF UTERUS  1975  . GALLBLADDER SURGERY  1993    Allergies  Allergen Reactions  . Codeine Sulfate     REACTION: unspecified  . Picato [Ingenol Mebutate] Hives    Immunization History  Administered Date(s) Administered  . Fluad Quad(high Dose 65+) 11/06/2018, 12/11/2019  . H1N1 02/26/2008  . Influenza Split 12/02/2010, 10/07/2011  . Influenza Whole 11/17/2006, 10/27/2007, 12/01/2009  . Influenza, High Dose Seasonal PF 10/15/2014, 11/10/2015, 10/25/2016, 10/27/2017  . Influenza,inj,Quad PF,6+ Mos 11/10/2012, 09/18/2013  . PFIZER(Purple Top)SARS-COV-2 Vaccination 04/01/2019, 05/01/2019  . Pneumococcal Conjugate-13 04/30/2013  . Pneumococcal Polysaccharide-23 01/26/2003  . Td 01/25/1997, 02/26/2008  . Zoster 04/08/2008  . Zoster Recombinat (Shingrix) 11/30/2019, 01/31/2020    Family History  Problem Relation Age of Onset  . Hypertension Mother   . Stroke  Mother   . Stroke Sister   . Stroke Sister   . Stroke Sister      Current Outpatient Medications:  .  albuterol (PROVENTIL HFA;VENTOLIN HFA) 108 (90 Base) MCG/ACT inhaler, Inhale 2 puffs into the lungs every 6 (six) hours as needed., Disp: 1 Inhaler, Rfl: 1 .  albuterol (PROVENTIL) (2.5 MG/3ML) 0.083% nebulizer solution, Take 3  mLs (2.5 mg total) by nebulization every 6 (six) hours as needed for wheezing or shortness of breath., Disp: 75 mL, Rfl: 6 .  amLODipine (NORVASC) 2.5 MG tablet, TAKE 1 TABLET (2.5 MG TOTAL) BY MOUTH DAILY., Disp: 90 tablet, Rfl: 0 .  amoxicillin-clavulanate (AUGMENTIN) 500-125 MG tablet, Take 1 tablet (500 mg total) by mouth 2 (two) times daily with a meal., Disp: 14 tablet, Rfl: 0 .  aspirin 81 MG tablet, Take 81 mg by mouth daily., Disp: , Rfl:  .  atorvastatin (LIPITOR) 10 MG tablet, TAKE 1 TABLET BY MOUTH DAILY., Disp: 90 tablet, Rfl: 0 .  Baclofen 5 MG TABS, Take 1 tablet by mouth at bedtime as needed., Disp: 30 tablet, Rfl: 0 .  benzonatate (TESSALON) 100 MG capsule, Take 1 capsule (100 mg total) by mouth 3 (three) times daily as needed for cough., Disp: 30 capsule, Rfl: 0 .  Calcium Carbonate-Vit D-Min (CALCIUM 1200 PO), Take 1 tablet by mouth., Disp: , Rfl:  .  doxycycline (VIBRA-TABS) 100 MG tablet, Take 1 tablet (100 mg total) by mouth 2 (two) times daily., Disp: 14 tablet, Rfl: 0 .  erythromycin ophthalmic ointment, Place a 1/2 inch ribbon of ointment into the lower eyelid., Disp: 3.5 g, Rfl: 0 .  FLUoxetine (PROZAC) 10 MG capsule, TAKE 1 CAPSULE BY MOUTH DAILY., Disp: 90 capsule, Rfl: 0 .  fluticasone (FLONASE) 50 MCG/ACT nasal spray, Place 1 spray into both nostrils 2 (two) times daily., Disp: 16 g, Rfl: 6 .  furosemide (LASIX) 20 MG tablet, Take 1 tablet (20 mg total) by mouth daily., Disp: 90 tablet, Rfl: 0 .  ketoconazole (NIZORAL) 2 % shampoo, Use as directed, Disp: 120 mL, Rfl: 1 .  levothyroxine (SYNTHROID) 25 MCG tablet, TAKE 1/2 TABLET BY MOUTH DAILY WITH 50 MCG TO EQUAL 62.5 MCG TOTAL DAILY, Disp: 45 tablet, Rfl: 0 .  levothyroxine (SYNTHROID) 50 MCG tablet, TAKE 1 TABLET BY MOUTH EVERY DAY, Disp: 90 tablet, Rfl: 0 .  loratadine (CLARITIN) 10 MG tablet, Take 1 tablet (10 mg total) by mouth daily., Disp: 90 tablet, Rfl: 1 .  potassium chloride SA (KLOR-CON) 20 MEQ tablet, TAKE  1 TABLET BY MOUTH ONCE DAILY., Disp: 90 tablet, Rfl: 0 .  risperiDONE (RISPERDAL) 1 MG tablet, Take 1 tablet (1 mg total) by mouth 2 (two) times daily., Disp: 120 tablet, Rfl: 0 .  Spacer/Aero-Holding Dorise Bullion, 1 Product by Does not apply route as needed., Disp: 1 each, Rfl: 1 .  STIOLTO RESPIMAT 2.5-2.5 MCG/ACT AERS, INHALE 2 PUFFS INTO THE LUNGS DAILY, Disp: 4 g, Rfl: 1 .  triamcinolone cream (KENALOG) 0.1 %, Apply 1 application topically 2 (two) times daily. Request jar, Disp: 453 g, Rfl: 0  Current Facility-Administered Medications:  .  ipratropium-albuterol (DUONEB) 0.5-2.5 (3) MG/3ML nebulizer solution 3 mL, 3 mL, Nebulization, Q6H, Danford, Katy D, NP, 3 mL at 12/08/17 0958      Objective:   Vitals:   03/20/20 1334  BP: 124/72  Pulse: 63  Temp: (!) 97.3 F (36.3 C)  TempSrc: Oral  SpO2: 98%  Weight: 156 lb 6.4 oz (70.9 kg)  Height: 5\' 4"  (1.626 m)    Estimated body mass index is 26.85 kg/m as calculated from the following:   Height as of this encounter: 5\' 4"  (1.626 m).   Weight as of this encounter: 156 lb 6.4 oz (70.9 kg).  @WEIGHTCHANGE @  [employer]   03/20/20 1334  Weight: 156 lb 6.4 oz (70.9 kg)     Physical Exam Elderly frail lady sitting in the wheelchair cognitively intact hard of hearing edentulous except upper has dentures.  Has bibasilar crackles normal heart sounds abdomen soft mild chronic venous stasis edema present.  Pleasant and interactive.      Assessment:       ICD-10-CM   1. Chronic sinusitis with recurrent bronchitis  J32.9    J40   2. Bibasilar crackles  R09.89   3. Dysphagia, unspecified type  R13.10    Concerned that she has aspiration related recurrent bronchitis/pneumonitis.  She could have underlying ILD.  She could have seasonal allergies she could have chronic sinus drainage.  Dentition does not seem to be an issue.    Plan:     Patient Instructions     ICD-10-CM   1. Chronic sinusitis with recurrent bronchitis   J32.9    J40   2. Bibasilar crackles  R09.89   3. Dysphagia, unspecified type  R13.10      Plan  - do CT sinus without contrast  - do HRCT supine and prone  - do cbc with diff, and blood IgE   - ask PCP Abonza, Maritza, PA-C to refer you for swallowing evaluation  Followup  - next few to several weeks with APP but after completing above     SIGNATURE    Dr. Brand Males, M.D., F.C.C.P,  Pulmonary and Critical Care Medicine Staff Physician, [place] Director - Interstitial Lung Disease  Program  Pulmonary [place] at [place], [place], [address]  Pager: [phone], If no answer or between  15:00h - 7:00h: call [phone] Telephone: [phone]  2:00 PM 03/20/2020

## 2020-03-20 NOTE — Addendum Note (Signed)
Addended byCoralie Keens on: 03/20/2020 02:05 PM   Modules accepted: Orders

## 2020-03-20 NOTE — Addendum Note (Signed)
Addended by: Suzzanne Cloud E on: 03/20/2020 02:06 PM   Modules accepted: Orders

## 2020-03-21 LAB — IGE: IgE (Immunoglobulin E), Serum: 8 kU/L (ref <=114)

## 2020-03-23 NOTE — Progress Notes (Signed)
Cbc with diff and Ig E normal. Will not call these norma results in . Having CT sinus/chest mid march 2022. Needs followup with app but I Do not see this. Please ensue followup

## 2020-03-24 ENCOUNTER — Other Ambulatory Visit: Payer: Self-pay | Admitting: Physician Assistant

## 2020-03-24 DIAGNOSIS — Z760 Encounter for issue of repeat prescription: Secondary | ICD-10-CM

## 2020-03-24 DIAGNOSIS — R1310 Dysphagia, unspecified: Secondary | ICD-10-CM

## 2020-03-24 NOTE — Progress Notes (Unsigned)
Ordering referral to Gastro per [facility] per Dr. Chase Caller due to Dysphagia. AS, CMA

## 2020-03-25 ENCOUNTER — Other Ambulatory Visit: Payer: Self-pay | Admitting: Physician Assistant

## 2020-03-25 DIAGNOSIS — R1310 Dysphagia, unspecified: Secondary | ICD-10-CM

## 2020-03-25 NOTE — Progress Notes (Signed)
Referral for Gastro placed per [facility] due to Dysphagia per Dr. Chase Caller. AS, CMA

## 2020-03-31 ENCOUNTER — Ambulatory Visit: Payer: Medicare HMO | Admitting: Primary Care

## 2020-03-31 ENCOUNTER — Other Ambulatory Visit: Payer: Self-pay | Admitting: Physician Assistant

## 2020-04-01 ENCOUNTER — Other Ambulatory Visit: Payer: Self-pay

## 2020-04-01 ENCOUNTER — Ambulatory Visit (INDEPENDENT_AMBULATORY_CARE_PROVIDER_SITE_OTHER): Payer: Medicare HMO | Admitting: Ophthalmology

## 2020-04-01 ENCOUNTER — Encounter (INDEPENDENT_AMBULATORY_CARE_PROVIDER_SITE_OTHER): Payer: Self-pay | Admitting: Ophthalmology

## 2020-04-01 DIAGNOSIS — H353211 Exudative age-related macular degeneration, right eye, with active choroidal neovascularization: Secondary | ICD-10-CM | POA: Diagnosis not present

## 2020-04-01 DIAGNOSIS — H43811 Vitreous degeneration, right eye: Secondary | ICD-10-CM

## 2020-04-01 DIAGNOSIS — H353124 Nonexudative age-related macular degeneration, left eye, advanced atrophic with subfoveal involvement: Secondary | ICD-10-CM

## 2020-04-01 MED ORDER — BEVACIZUMAB 2.5 MG/0.1ML IZ SOSY
2.5000 mg | PREFILLED_SYRINGE | INTRAVITREAL | Status: AC | PRN
  Administered 2020-04-01: 2.5 mg via INTRAVITREAL

## 2020-04-01 NOTE — Assessment & Plan Note (Signed)
Anatomy and acuity vastly improved since onset of therapy for active disease in a foveal perifoveal location from November 2021  Repeat injection today to maintain currently at 6-week interval.

## 2020-04-01 NOTE — Progress Notes (Signed)
04/01/2020     CHIEF COMPLAINT Patient presents for Retina Follow Up (6 week Wet AMD f\u OD. Possible Avastin OD. OCT/Pt states no changes in vision. Pt had conjunctivitis a few weeks ago. Pt was prescribed an ointment by Dr. Herb Grays. Pt has since finished the ointment.)   HISTORY OF PRESENT ILLNESS: Donna Arias is a 87 y.o. female who presents to the clinic today for:   HPI    Retina Follow Up    Patient presents with  Wet AMD.  In right eye.  Severity is moderate.  Duration of 6 weeks.  Since onset it is stable.  I, the attending physician,  performed the HPI with the patient and updated documentation appropriately. Additional comments: 6 week Wet AMD f\u OD. Possible Avastin OD. OCT Pt states no changes in vision. Pt had conjunctivitis a few weeks ago. Pt was prescribed an ointment by Dr. Herb Grays. Pt has since finished the ointment.       Last edited by Tilda Franco on 04/01/2020  9:07 AM. (History)      Referring physician: Lorrene Reid, PA-C [place],  [place] [address]  HISTORICAL INFORMATION:   Selected notes from the MEDICAL RECORD NUMBER    Lab Results  Component Value Date   HGBA1C 5.6 08/07/2019     CURRENT MEDICATIONS: Current Outpatient Medications (Ophthalmic Drugs)  Medication Sig  . erythromycin ophthalmic ointment Place a 1/2 inch ribbon of ointment into the lower eyelid.   No current facility-administered medications for this visit. (Ophthalmic Drugs)   Current Outpatient Medications (Other)  Medication Sig  . albuterol (PROVENTIL HFA;VENTOLIN HFA) 108 (90 Base) MCG/ACT inhaler Inhale 2 puffs into the lungs every 6 (six) hours as needed.  [name] albuterol (PROVENTIL) (2.5 MG/3ML) 0.083% nebulizer solution Take 3 mLs (2.5 mg total) by nebulization every 6 (six) hours as needed for wheezing or shortness of breath.  [name] amLODipine (NORVASC) 2.5 MG tablet TAKE 1 TABLET (2.5 MG TOTAL) BY MOUTH DAILY.  [name] amoxicillin-clavulanate  (AUGMENTIN) 500-125 MG tablet Take 1 tablet (500 mg total) by mouth 2 (two) times daily with a meal.  . aspirin 81 MG tablet Take 81 mg by mouth daily.  [name] atorvastatin (LIPITOR) 10 MG tablet TAKE 1 TABLET BY MOUTH DAILY.  . Baclofen 5 MG TABS Take 1 tablet by mouth at bedtime as needed.  . benzonatate (TESSALON) 100 MG capsule Take 1 capsule (100 mg total) by mouth 3 (three) times daily as needed for cough.  . Calcium Carbonate-Vit D-Min (CALCIUM 1200 PO) Take 1 tablet by mouth.  . doxycycline (VIBRA-TABS) 100 MG tablet Take 1 tablet (100 mg total) by mouth 2 (two) times daily.  [name] FLUoxetine (PROZAC) 10 MG capsule TAKE 1 CAPSULE BY MOUTH DAILY.  . fluticasone (FLONASE) 50 MCG/ACT nasal spray Place 1 spray into both nostrils 2 (two) times daily.  . furosemide (LASIX) 20 MG tablet Take 1 tablet (20 mg total) by mouth daily.  [name] ketoconazole (NIZORAL) 2 % shampoo Use as directed  . levothyroxine (SYNTHROID) 25 MCG tablet TAKE 1/2 TABLET BY MOUTH DAILY WITH 50 MCG TO EQUAL 62.5 MCG TOTAL DAILY  . levothyroxine (SYNTHROID) 50 MCG tablet TAKE 1 TABLET BY MOUTH EVERY DAY  . loratadine (CLARITIN) 10 MG tablet Take 1 tablet (10 mg total) by mouth daily.  . potassium chloride SA (KLOR-CON) 20 MEQ tablet TAKE 1 TABLET BY MOUTH ONCE DAILY.  [name] risperiDONE (RISPERDAL) 1 MG tablet TAKE 1 TABLET BY  MOUTH 2 TIMES DAILY.  [name] Spacer/Aero-Holding Chambers DEVI 1 Product by Does not apply route as needed.  [name] STIOLTO RESPIMAT 2.5-2.5 MCG/ACT AERS INHALE 2 PUFFS INTO THE LUNGS DAILY  . triamcinolone cream (KENALOG) 0.1 % Apply 1 application topically 2 (two) times daily. Request jar   Current Facility-Administered Medications (Other)  Medication Route  . ipratropium-albuterol (DUONEB) 0.5-2.5 (3) MG/3ML nebulizer solution 3 mL Nebulization      REVIEW OF SYSTEMS:    ALLERGIES Allergies  Allergen Reactions  . Codeine Sulfate     REACTION: unspecified  . Picato [Ingenol Mebutate] Hives    PAST MEDICAL  HISTORY Past Medical History:  Diagnosis Date  . Cancer (HCC)    squamous cell of left face  . COPD (chronic obstructive pulmonary disease) ([place])   . Dementia ([place])   . Dementia ([place])   . GERD (gastroesophageal reflux disease)   . Heart attack ([place]) 1998  . Hypertension   . Stroke ([place])   . Thyroid disease    Past Surgical History:  Procedure Laterality Date  . BREAST LUMPECTOMY  1995   Left Breast  . DILATION AND CURETTAGE OF UTERUS  1975  . GALLBLADDER SURGERY  1993    FAMILY HISTORY Family History  Problem Relation Age of Onset  . Hypertension Mother   . Stroke Mother   . Stroke Sister   . Stroke Sister   . Stroke Sister     SOCIAL HISTORY Social History   Tobacco Use  . Smoking status: Former Research scientist (life sciences)  . Smokeless tobacco: Never Used  Vaping Use  . Vaping Use: Never used  Substance Use Topics  . Alcohol use: No  . Drug use: No         OPHTHALMIC EXAM: Base Eye Exam    Visual Acuity (Snellen - Linear)      Right Left   Dist cc 20/50 +1 E Card @ 5'   Dist ph cc NI NI   Correction: Glasses       Tonometry (Tonopen, 9:14 AM)      Right Left   Pressure 8 9       Pupils      Pupils Dark Light Shape React APD   Right PERRL 3 2 Round Brisk None   Left PERRL 3 2 Round Brisk None       Visual Fields   Poor understanding       Neuro/Psych    Oriented x3: Yes   Mood/Affect: Normal       Dilation    Right eye: 1.0% Mydriacyl, 2.5% Phenylephrine @ 9:14 AM        Slit Lamp and Fundus Exam    External Exam      Right Left   External Normal Normal       Slit Lamp Exam      Right Left   Lids/Lashes Normal Normal   Conjunctiva/Sclera White and quiet White and quiet   Cornea Clear Clear   Anterior Chamber Deep and quiet Deep and quiet   Iris Round and reactive Round and reactive   Lens Centered posterior chamber intraocular lens Centered posterior chamber intraocular lens   Anterior Vitreous Normal Normal       Fundus Exam      Right  Left   Posterior Vitreous Posterior vitreous detachment    Disc Peripapillary atrophy    C/D Ratio 0.65    Macula Retinal pigment epithelial mottling, no macular thickening, Retinal atrophy, Early age related macular  degeneration, no serous retinal detachment visible clinically, minor epiretinal membrane not distorting    Vessels Normal    Periphery Normal           IMAGING AND PROCEDURES  Imaging and Procedures for 04/01/20  OCT, Retina - OU - Both Eyes       Right Eye Quality was good. Scan locations included subfoveal. Central Foveal Thickness: 277. Progression has improved. Findings include abnormal foveal contour.   Left Eye Quality was good. Scan locations included subfoveal. Central Foveal Thickness: 178. Progression has been stable.   Notes Less subretinal fluid, now resolved and perifoveal CME completely resolved as compared to findings November 2021. Currently at 6-week interval. Repeat injection Avastin today and examination again in 6 weeks to maintain acuity in this best functioning eye  OS with macular atrophy from previous retinal vascular disease.  Stable overall       Intravitreal Injection, Pharmacologic Agent - OD - Right Eye       Time Out 04/01/2020. 9:41 AM. Confirmed correct patient, procedure, site, and patient consented.   Anesthesia Topical anesthesia was used. Anesthetic medications included Akten 3.5%.   Procedure Preparation included Ofloxacin , Tobramycin 0.3%, 10% betadine to eyelids, 5% betadine to ocular surface. A 30 gauge needle was used.   Injection:  2.5 mg Bevacizumab (AVASTIN) 2.5mg /0.73mL SOSY   NDC: 16384-665-99, Lot: [id number]   Route: Intravitreal, Site: Right Eye  Post-op Post injection exam found visual acuity of at least counting fingers. The patient tolerated the procedure well. There were no complications. The patient received written and verbal post procedure care education. Post injection medications were not given.                  ASSESSMENT/PLAN:  Exudative age-related macular degeneration of right eye with active choroidal neovascularization ([place]) Anatomy and acuity vastly improved since onset of therapy for active disease in a foveal perifoveal location from November 2021  Repeat injection today to maintain currently at 6-week interval.  Advanced nonexudative age-related macular degeneration of left eye with subfoveal involvement Accounts for acuity no change OS  Posterior vitreous detachment of right eye   The nature of posterior vitreous detachment was discussed with the patient as well as its physiology, its age prevalence, and its possible implication regarding retinal breaks and detachment.  An informational brochure was given to the patient.  All the patient's questions were answered.  The patient was asked to return if new or different flashes or floaters develops.   Patient was instructed to contact office immediately if any changes were noticed. I explained to the patient that vitreous inside the eye is similar to jello inside a bowl. As the jello melts it can start to pull away from the bowl, similarly the vitreous throughout our lives can begin to pull away from the retina. That process is called a posterior vitreous detachment. In some cases, the vitreous can tug hard enough on the retina to form a retinal tear. I discussed with the patient the signs and symptoms of a retinal detachment.  Do not rub the eye.      ICD-10-CM   1. Exudative age-related macular degeneration of right eye with active choroidal neovascularization (HCC)  H35.3211 OCT, Retina - OU - Both Eyes    Intravitreal Injection, Pharmacologic Agent - OD - Right Eye    bevacizumab (AVASTIN) SOSY 2.5 mg  2. Advanced nonexudative age-related macular degeneration of left eye with subfoveal involvement  H35.3124   3. Posterior vitreous  detachment of right eye  H43.811     1.  OD, much improved on therapy since onset of  disease November 2021.  Preserved good acuity.  At follow-up interval 6 weeks currently.  We will repeat injection Avastin today and examination again in 6 weeks  2.  Dilate OU next  3.  Ophthalmic Meds Ordered this visit:  Meds ordered this encounter  Medications  . bevacizumab (AVASTIN) SOSY 2.5 mg       Return in about 6 weeks (around 05/13/2020) for DILATE OU, AVASTIN OCT, OD.  There are no Patient Instructions on file for this visit.   Explained the diagnoses, plan, and follow up with the patient and they expressed understanding.  Patient expressed understanding of the importance of proper follow up care.   Clent Demark Liadan Guizar M.D. Diseases & Surgery of the Retina and Vitreous [facility] 04/01/20     Abbreviations: M myopia (nearsighted); A astigmatism; H hyperopia (farsighted); P presbyopia; Mrx spectacle prescription;  CTL contact lenses; OD right eye; OS left eye; OU both eyes  XT exotropia; ET esotropia; PEK punctate epithelial keratitis; PEE punctate epithelial erosions; DES dry eye syndrome; MGD meibomian gland dysfunction; ATs artificial tears; PFAT's preservative free artificial tears; [place] nuclear sclerotic cataract; PSC posterior subcapsular cataract; ERM epi-retinal membrane; PVD posterior vitreous detachment; RD retinal detachment; DM diabetes mellitus; DR diabetic retinopathy; NPDR non-proliferative diabetic retinopathy; PDR proliferative diabetic retinopathy; CSME clinically significant macular edema; DME diabetic macular edema; dbh dot blot hemorrhages; CWS cotton wool spot; POAG primary open angle glaucoma; C/D cup-to-disc ratio; HVF humphrey visual field; GVF goldmann visual field; OCT optical coherence tomography; IOP intraocular pressure; BRVO Branch retinal vein occlusion; CRVO central retinal vein occlusion; CRAO central retinal artery occlusion; BRAO branch retinal artery occlusion; RT retinal tear; SB scleral buckle; PPV pars plana vitrectomy; VH  Vitreous hemorrhage; PRP panretinal laser photocoagulation; IVK intravitreal kenalog; VMT vitreomacular traction; MH Macular hole;  NVD neovascularization of the disc; NVE neovascularization elsewhere; AREDS age related eye disease study; ARMD age related macular degeneration; POAG primary open angle glaucoma; EBMD epithelial/anterior basement membrane dystrophy; ACIOL anterior chamber intraocular lens; IOL intraocular lens; PCIOL posterior chamber intraocular lens; Phaco/IOL phacoemulsification with intraocular lens placement; [place] photorefractive keratectomy; LASIK laser assisted in situ keratomileusis; HTN hypertension; DM diabetes mellitus; COPD chronic obstructive pulmonary disease

## 2020-04-01 NOTE — Assessment & Plan Note (Signed)

## 2020-04-01 NOTE — Assessment & Plan Note (Signed)
Accounts for acuity no change OS

## 2020-04-07 ENCOUNTER — Ambulatory Visit
Admission: RE | Admit: 2020-04-07 | Discharge: 2020-04-07 | Disposition: A | Discharge status: Home / Self Care | Payer: Medicare HMO | Source: Ambulatory Visit | Attending: Internal Medicine | Admitting: Internal Medicine

## 2020-04-07 ENCOUNTER — Other Ambulatory Visit: Payer: Self-pay | Admitting: Physician Assistant

## 2020-04-07 DIAGNOSIS — J323 Chronic sphenoidal sinusitis: Secondary | ICD-10-CM | POA: Diagnosis not present

## 2020-04-07 DIAGNOSIS — J329 Chronic sinusitis, unspecified: Secondary | ICD-10-CM

## 2020-04-07 DIAGNOSIS — J40 Bronchitis, not specified as acute or chronic: Secondary | ICD-10-CM

## 2020-04-07 DIAGNOSIS — R1310 Dysphagia, unspecified: Secondary | ICD-10-CM

## 2020-04-07 DIAGNOSIS — R918 Other nonspecific abnormal finding of lung field: Secondary | ICD-10-CM | POA: Diagnosis not present

## 2020-04-07 DIAGNOSIS — R0989 Other specified symptoms and signs involving the circulatory and respiratory systems: Secondary | ICD-10-CM

## 2020-04-07 DIAGNOSIS — J322 Chronic ethmoidal sinusitis: Secondary | ICD-10-CM | POA: Diagnosis not present

## 2020-04-07 DIAGNOSIS — J3489 Other specified disorders of nose and nasal sinuses: Secondary | ICD-10-CM | POA: Diagnosis not present

## 2020-04-07 DIAGNOSIS — J320 Chronic maxillary sinusitis: Secondary | ICD-10-CM | POA: Diagnosis not present

## 2020-04-08 ENCOUNTER — Telehealth: Payer: Self-pay | Admitting: Physician Assistant

## 2020-04-08 ENCOUNTER — Other Ambulatory Visit: Payer: Self-pay | Admitting: Physician Assistant

## 2020-04-08 DIAGNOSIS — J441 Chronic obstructive pulmonary disease with (acute) exacerbation: Secondary | ICD-10-CM

## 2020-04-08 DIAGNOSIS — J449 Chronic obstructive pulmonary disease, unspecified: Secondary | ICD-10-CM

## 2020-04-08 MED ORDER — STIOLTO RESPIMAT 2.5-2.5 MCG/ACT IN AERS
2.0000 | INHALATION_SPRAY | Freq: Every day | RESPIRATORY_TRACT | 2 refills | Status: DC
Start: 2020-04-08 — End: 2020-07-14

## 2020-04-08 NOTE — Telephone Encounter (Signed)
Opened in error

## 2020-04-11 ENCOUNTER — Ambulatory Visit: Payer: Medicare HMO | Admitting: Gastroenterology

## 2020-04-14 ENCOUNTER — Other Ambulatory Visit: Payer: Self-pay | Admitting: Physician Assistant

## 2020-04-14 ENCOUNTER — Telehealth: Payer: Self-pay | Admitting: Physician Assistant

## 2020-04-14 DIAGNOSIS — J440 Chronic obstructive pulmonary disease with acute lower respiratory infection: Secondary | ICD-10-CM

## 2020-04-14 DIAGNOSIS — R059 Cough, unspecified: Secondary | ICD-10-CM

## 2020-04-14 DIAGNOSIS — J209 Acute bronchitis, unspecified: Secondary | ICD-10-CM

## 2020-04-14 MED ORDER — BENZONATATE 100 MG PO CAPS: 100.0000 mg | ORAL_CAPSULE | Freq: Three times a day (TID) | ORAL | 0 refills | Status: DC | PRN

## 2020-04-14 NOTE — Telephone Encounter (Signed)
[name] is aware of the below and verbalized understanding. Pt scheduled for tomorrow with Nira Conn at 115pm. AS, CMA

## 2020-04-14 NOTE — Telephone Encounter (Signed)
Donna Arias states Donna Arias is coughing, sneezing, a lot of mucus.Pt has not taken a covid test. Using nebulizer and Mucinex. They have not reached out to Pulmonologist. She is requesting Tessalon pearls. Please advise. AS, CMA

## 2020-04-14 NOTE — Telephone Encounter (Signed)
Requesting a refill benzonatate (TESSALON) 100 MG capsule    Patient has come down with allergies, per daughter, Tia Alert.  Patient has been sleeping upright in her chair for the last 2 nights.  Patient has been given a small amount of Mucinex, this is not helping. She is coughing a very small amount of flem, causing her to cough constantly No fever Not hoarse  The GI Physician rescheduled her, pushing back the Pulmonary visit- she will request additional refills as needed from the [place]- Please review the CT scans - no need to call her, she just would like you to see, if not already.

## 2020-04-14 NOTE — Telephone Encounter (Signed)
Will send refill of tessalon perles and recommend patient schedule a visit for further evaluation. Would prefer in-office visit to perform an exam.   Thank you, Herb Grays

## 2020-04-15 ENCOUNTER — Other Ambulatory Visit: Payer: Self-pay

## 2020-04-15 ENCOUNTER — Encounter: Payer: Self-pay | Admitting: Nurse Practitioner

## 2020-04-15 ENCOUNTER — Ambulatory Visit: Payer: Medicare HMO | Admitting: Nurse Practitioner

## 2020-04-15 VITALS — BP 125/87 | HR 96 | Temp 97.1°F | Ht 63.0 in | Wt 151.8 lb

## 2020-04-15 DIAGNOSIS — J441 Chronic obstructive pulmonary disease with (acute) exacerbation: Secondary | ICD-10-CM | POA: Diagnosis not present

## 2020-04-15 DIAGNOSIS — H6121 Impacted cerumen, right ear: Secondary | ICD-10-CM | POA: Diagnosis not present

## 2020-04-15 DIAGNOSIS — J0111 Acute recurrent frontal sinusitis: Secondary | ICD-10-CM | POA: Diagnosis not present

## 2020-04-15 MED ORDER — CEFUROXIME AXETIL 250 MG PO TABS: 250.0000 mg | ORAL_TABLET | Freq: Two times a day (BID) | ORAL | 0 refills | Status: DC

## 2020-04-15 MED ORDER — METHYLPREDNISOLONE 4 MG PO TBPK: ORAL_TABLET | ORAL | 0 refills | Status: DC

## 2020-04-15 NOTE — Progress Notes (Signed)
Acute Office Visit  Subjective:    Patient ID: Donna Arias, female    DOB: [birth year], 87 y.o.   MRN: [id number]  Chief Complaint  Patient presents with   URI    HPI Patient is in today for evaluation of cough and congestion.  Symptoms initially started as sore throat. She developed a great deal of congestions and mucus. Cough is productive at times. She does have mild chronic bronchitis. This is the fourth exacerbation of chronic bronchitis since October 2021. She has seen pulmonary provider. Uses Stiolto every day. Since symptoms started, she has been using nebulizer more often. Has helped with wheezing and cough. The patient also reports ear pain, mostly in the right ear. She has headache in the region of her frontal sinus. Feels stopped up and congested. She denies fever, chills, nausea, or vomiting. Her appetite has been at baseline. Her daughter presents to the office with the patient. Daughter states that everyone in the family has been ill with similar symptoms over the past week. The patient took a home COVID 19 test today and results were negative.   Past Medical History:  Diagnosis Date   Cancer ([place])    squamous cell of left face   COPD (chronic obstructive pulmonary disease) (HCC)    Dementia (HCC)    Dementia (HCC)    GERD (gastroesophageal reflux disease)    Heart attack ([place]) 1998   Hypertension    Stroke ([place])    Thyroid disease     Past Surgical History:  Procedure Laterality Date   BREAST LUMPECTOMY  1995   Left Breast   DILATION AND CURETTAGE OF UTERUS  1975   [place]    Family History  Problem Relation Age of Onset   Hypertension Mother    Stroke Mother    Stroke Sister    Stroke Sister    Stroke Sister     Social History   Socioeconomic History   Marital status: Widowed    Spouse name: Not on file   Number of children: 1   Years of education: Not on file   Highest education level: Not on file   Occupational History   Occupation: RETIRED  Tobacco Use   Smoking status: Former Smoker   Smokeless tobacco: Never Used  Scientific laboratory technician Use: Never used  Substance and Sexual Activity   Alcohol use: No   Drug use: No   Sexual activity: Never    Birth control/protection: None  Other Topics Concern   Not on file  Social History Narrative   CB x 1   Lives at home with her daughter (since 02/05/2013)   Right handed   Regular exercise - NO   Social Determinants of Health   Financial Resource Strain: Not on file  Food Insecurity: Not on file  Transportation Needs: Not on file  Physical Activity: Not on file  Stress: Not on file  Social Connections: Not on file  Intimate Partner Violence: Not on file    Outpatient Medications Prior to Visit  Medication Sig Dispense Refill   albuterol (PROVENTIL HFA;VENTOLIN HFA) 108 (90 Base) MCG/ACT inhaler Inhale 2 puffs into the lungs every 6 (six) hours as needed. 1 Inhaler 1   albuterol (PROVENTIL) (2.5 MG/3ML) 0.083% nebulizer solution Take 3 mLs (2.5 mg total) by nebulization every 6 (six) hours as needed for wheezing or shortness of breath. 75 mL 6   amLODipine (NORVASC) 2.5 MG tablet TAKE 1 TABLET (2.5  MG TOTAL) BY MOUTH DAILY. 90 tablet 0   aspirin 81 MG tablet Take 81 mg by mouth daily.     atorvastatin (LIPITOR) 10 MG tablet TAKE 1 TABLET BY MOUTH DAILY. 90 tablet 0   Baclofen 5 MG TABS Take 1 tablet by mouth at bedtime as needed. 30 tablet 0   benzonatate (TESSALON) 100 MG capsule Take 1 capsule (100 mg total) by mouth 3 (three) times daily as needed for cough. 30 capsule 0   Calcium Carbonate-Vit D-Min (CALCIUM 1200 PO) Take 1 tablet by mouth.     doxycycline (VIBRA-TABS) 100 MG tablet Take 1 tablet (100 mg total) by mouth 2 (two) times daily. 14 tablet 0   erythromycin ophthalmic ointment Place a 1/2 inch ribbon of ointment into the lower eyelid. 3.5 g 0   FLUoxetine (PROZAC) 10 MG capsule TAKE 1 CAPSULE BY  MOUTH DAILY. 90 capsule 0   fluticasone (FLONASE) 50 MCG/ACT nasal spray Place 1 spray into both nostrils 2 (two) times daily. 16 g 6   furosemide (LASIX) 20 MG tablet Take 1 tablet (20 mg total) by mouth daily. 90 tablet 0   ketoconazole (NIZORAL) 2 % shampoo Use as directed 120 mL 1   levothyroxine (SYNTHROID) 25 MCG tablet TAKE 1/2 TABLET BY MOUTH DAILY WITH 50 MCG TO EQUAL 62.5 MCG TOTAL DAILY 45 tablet 0   levothyroxine (SYNTHROID) 50 MCG tablet TAKE 1 TABLET BY MOUTH EVERY DAY 90 tablet 0   loratadine (CLARITIN) 10 MG tablet Take 1 tablet (10 mg total) by mouth daily. 90 tablet 1   potassium chloride SA (KLOR-CON) 20 MEQ tablet TAKE 1 TABLET BY MOUTH ONCE DAILY. 90 tablet 0   risperiDONE (RISPERDAL) 1 MG tablet TAKE 1 TABLET BY MOUTH 2 TIMES DAILY. 120 tablet 0   Spacer/Aero-Holding Chambers DEVI 1 Product by Does not apply route as needed. 1 each 1   STIOLTO RESPIMAT 2.5-2.5 MCG/ACT AERS Inhale 2 puffs into the lungs daily. 4 g 2   triamcinolone cream (KENALOG) 0.1 % Apply 1 application topically 2 (two) times daily. Request jar 453 g 0   amoxicillin-clavulanate (AUGMENTIN) 500-125 MG tablet Take 1 tablet (500 mg total) by mouth 2 (two) times daily with a meal. (Patient not taking: Reported on 04/15/2020) 14 tablet 0   Facility-Administered Medications Prior to Visit  Medication Dose Route Frequency Provider Last Rate Last Admin   ipratropium-albuterol (DUONEB) 0.5-2.5 (3) MG/3ML nebulizer solution 3 mL  3 mL Nebulization Q6H Danford, Katy D, NP   3 mL at 12/08/17 0958    Allergies  Allergen Reactions   Codeine Sulfate     REACTION: unspecified   Picato [Ingenol Mebutate] Hives    Review of Systems  Constitutional: Positive for fatigue. Negative for activity change, chills and fever.  HENT: Positive for congestion, ear pain, postnasal drip, rhinorrhea, sinus pressure and sore throat. Negative for sinus pain.        Right ear feels very plugged and full. Having a hard  time hearing.   Respiratory: Positive for cough, shortness of breath and wheezing.   Cardiovascular: Negative for chest pain and palpitations.  Gastrointestinal: Negative for constipation, nausea and vomiting.  Genitourinary: Negative.   Musculoskeletal: Negative for back pain and myalgias.  Skin: Negative for rash.  Allergic/Immunologic: Positive for environmental allergies.  Neurological: Positive for headaches. Negative for dizziness and weakness.  Psychiatric/Behavioral: The patient is not nervous/anxious.   All other systems reviewed and are negative.      Objective:  Physical Exam Vitals and nursing note reviewed.  Constitutional:      Appearance: Normal appearance. She is well-developed. She is ill-appearing.  HENT:     Head: Normocephalic.     Right Ear: There is impacted cerumen. Tympanic membrane is erythematous and bulging.     Left Ear: Tympanic membrane is erythematous and bulging.     Nose: Congestion present.     Right Sinus: Maxillary sinus tenderness and frontal sinus tenderness present.     Left Sinus: Maxillary sinus tenderness and frontal sinus tenderness present.     Mouth/Throat:     Pharynx: Posterior oropharyngeal erythema present.  Eyes:     Pupils: Pupils are equal, round, and reactive to light.  Cardiovascular:     Rate and Rhythm: Normal rate and regular rhythm.     Heart sounds: Normal heart sounds.  Pulmonary:     Effort: Pulmonary effort is normal.     Breath sounds: Normal breath sounds.  Abdominal:     Palpations: Abdomen is soft.  Musculoskeletal:        General: Normal range of motion.     Cervical back: Normal range of motion and neck supple.  Lymphadenopathy:     Cervical: Cervical adenopathy present.  Skin:    General: Skin is warm and dry.     Capillary Refill: Capillary refill takes less than 2 seconds.  Neurological:     General: No focal deficit present.     Mental Status: She is alert and oriented to person, place, and  time.  Psychiatric:        Mood and Affect: Mood normal.        Behavior: Behavior normal.        Thought Content: Thought content normal.        Judgment: Judgment normal.     Today's Vitals   04/15/20 1325  BP: 125/87  Pulse: 96  Temp: (!) 97.1 F (36.2 C)  SpO2: 96%  Weight: 151 lb 12.8 oz (68.9 kg)  Height: 5\' 3"  (1.6 m)   Body mass index is 26.89 kg/m.    Wt Readings from Last 3 Encounters:  04/15/20 151 lb 12.8 oz (68.9 kg)  03/20/20 156 lb 6.4 oz (70.9 kg)  03/10/20 157 lb (71.2 kg)    Health Maintenance Due  Topic Date Due   TETANUS/TDAP  02/25/2018   COVID-19 Vaccine (4 - Booster for Pfizer series) 10/31/2019    There are no preventive care reminders to display for this patient.   Lab Results  Component Value Date   TSH 2.790 08/07/2019   Lab Results  Component Value Date   WBC 7.1 03/20/2020   HGB 12.7 03/20/2020   HCT 38.2 03/20/2020   MCV 94.3 03/20/2020   PLT 324.0 03/20/2020   Lab Results  Component Value Date   NA 146 (H) 02/18/2020   K 3.9 02/18/2020   CO2 23 02/18/2020   GLUCOSE 105 (H) 02/18/2020   BUN 17 02/18/2020   CREATININE 1.14 (H) 02/18/2020   BILITOT 0.4 02/18/2020   ALKPHOS 92 02/18/2020   AST 18 02/18/2020   ALT 17 02/18/2020   PROT 6.0 02/18/2020   ALBUMIN 3.8 02/18/2020   CALCIUM 10.0 02/18/2020   GFR 45.15 (L) 08/01/2015   Lab Results  Component Value Date   CHOL 151 02/18/2020   Lab Results  Component Value Date   HDL 69 02/18/2020   Lab Results  Component Value Date   LDLCALC 67 02/18/2020   Lab  Results  Component Value Date   TRIG 75 02/18/2020   Lab Results  Component Value Date   CHOLHDL 2.2 02/18/2020   Lab Results  Component Value Date   HGBA1C 5.6 08/07/2019       Assessment & Plan:  1. Acute recurrent frontal sinusitis Start ceftin 250mg  twice daily for next 10 days. Rest and increase fluids. Take OTC medications as needed and as indicated for acute symptoms.  - cefUROXime  (CEFTIN) 250 MG tablet; Take 1 tablet (250 mg total) by mouth 2 (two) times daily with a meal.  Dispense: 20 tablet; Refill: 0  2. COPD exacerbation (HCC) Add medrol dose pack. Take as directed for 6 days. Use Stialto daily. contneu rescue inhaler and respiratory treatments as needed and as prescribed  - methylPREDNISolone (MEDROL) 4 MG TBPK tablet; Take by mouth as directed for 6 days  Dispense: 21 tablet; Refill: 0  3. Impacted cerumen of right ear Right ear lavage performed today. There was clearance of ear wax from canal. No erythema noted after procedure. Patient tolerated this well.  - EAR CERUMEN REMOVAL   Problem List Items Addressed This Visit      Respiratory   COPD exacerbation (HCC)   Relevant Medications   methylPREDNISolone (MEDROL) 4 MG TBPK tablet   Acute recurrent frontal sinusitis - Primary   Relevant Medications   cefUROXime (CEFTIN) 250 MG tablet   methylPREDNISolone (MEDROL) 4 MG TBPK tablet     Nervous and Auditory   Impacted cerumen of right ear   Relevant Orders   EAR CERUMEN REMOVAL       Meds ordered this encounter  Medications   cefUROXime (CEFTIN) 250 MG tablet    Sig: Take 1 tablet (250 mg total) by mouth 2 (two) times daily with a meal.    Dispense:  20 tablet    Refill:  0    Please fill with generic alternative preferred by her insurance. Thanks.    Order Specific Question:   Supervising Provider    Answer:   Beatrice Lecher D [[id number]]   methylPREDNISolone (MEDROL) 4 MG TBPK tablet    Sig: Take by mouth as directed for 6 days    Dispense:  21 tablet    Refill:  0    Order Specific Question:   Supervising Provider    Answer:   Beatrice Lecher D [[id number]]   Time spent with the patient was approximately 30 minutes. This time included reviewing progress notes, labs, imaging studies, and discussing plan for follow up.   Ronnell Freshwater, NP

## 2020-04-15 NOTE — Patient Instructions (Signed)

## 2020-04-18 DIAGNOSIS — J0111 Acute recurrent frontal sinusitis: Secondary | ICD-10-CM | POA: Insufficient documentation

## 2020-04-18 DIAGNOSIS — H6121 Impacted cerumen, right ear: Secondary | ICD-10-CM | POA: Insufficient documentation

## 2020-04-20 NOTE — Progress Notes (Signed)
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 Will be seeing her 05/06/20. Will discuss during that viist

## 2020-04-21 ENCOUNTER — Ambulatory Visit: Payer: Medicare HMO | Admitting: Dermatology

## 2020-04-21 NOTE — Progress Notes (Signed)
There is sinus congestion on scan of sinus, PLAN - will review results mid April 2022 office visit

## 2020-04-30 ENCOUNTER — Ambulatory Visit: Payer: Medicare HMO | Admitting: Gastroenterology

## 2020-04-30 ENCOUNTER — Encounter: Payer: Self-pay | Admitting: Gastroenterology

## 2020-04-30 ENCOUNTER — Other Ambulatory Visit: Payer: Self-pay

## 2020-04-30 VITALS — BP 138/70 | HR 68 | Ht 62.5 in | Wt 151.1 lb

## 2020-04-30 DIAGNOSIS — R1310 Dysphagia, unspecified: Secondary | ICD-10-CM | POA: Insufficient documentation

## 2020-04-30 DIAGNOSIS — R059 Cough, unspecified: Secondary | ICD-10-CM | POA: Diagnosis not present

## 2020-04-30 NOTE — Patient Instructions (Addendum)
If you are age 85 or older, your body mass index should be between 23-30. Your Body mass index is 27.2 kg/m. If this is out of the aforementioned range listed, please consider follow up with your Primary Care Provider.  If you are age 84 or younger, your body mass index should be between 19-25. Your Body mass index is 27.2 kg/m. If this is out of the aformentioned range listed, please consider follow up with your Primary Care Provider.   You have been scheduled for a Barium Esophogram at [facility] (1st floor of the hospital) on 05-09-2020 at 930am. Please arrive 15 minutes prior to your appointment for registration. Make certain not to have anything to eat or drink 3 hours prior to your test. If you need to reschedule for any reason, please contact radiology at [phone] to do so. __________________________________________________________________ A barium swallow is an examination that concentrates on views of the esophagus. This tends to be a double contrast exam (barium and two liquids which, when combined, create a gas to distend the wall of the oesophagus) or single contrast (non-ionic iodine based). The study is usually tailored to your symptoms so a good history is essential. Attention is paid during the study to the form, structure and configuration of the esophagus, looking for functional disorders (such as aspiration, dysphagia, achalasia, motility and reflux) EXAMINATION You may be asked to change into a gown, depending on the type of swallow being performed. A radiologist and radiographer will perform the procedure. The radiologist will advise you of the type of contrast selected for your procedure and direct you during the exam. You will be asked to stand, sit or lie in several different positions and to hold a small amount of fluid in your mouth before being asked to swallow while the imaging is performed .In some instances you may be asked to swallow barium coated marshmallows  to assess the motility of a solid food bolus. The exam can be recorded as a digital or video fluoroscopy procedure. POST PROCEDURE It will take 1-2 days for the barium to pass through your system. To facilitate this, it is important, unless otherwise directed, to increase your fluids for the next 24-48hrs and to resume your normal diet.  This test typically takes about 30 minutes to perform. __________________________________________________________________________________   Donna Arias have been scheduled for a modified barium swallow on         at            Please arrive 15 minutes prior to your test for registration. You will go to                   Radiology (1st Floor) for your appointment. Should you need to cancel or reschedule your appointment, please contact [phone] Donna Arias) or [phone] Donna Arias). _____________________________________________________________________ A Modified Barium Swallow Study, or MBS, is a special x-ray that is taken to check swallowing skills. It is carried out by a Stage manager and a Psychologist, clinical (SLP). During this test, yourmouth, throat, and esophagus, a muscular tube which connects your mouth to your stomach, is checked. The test will help you, your doctor, and the SLP plan what types of foods and liquids are easier for you to swallow. The SLP will also identify positions and ways to help you swallow more easily and safely. What will happen during an MBS? You will be taken to an x-ray room and seated comfortably. You will be asked to swallow small amounts of food  and liquid mixed with barium. Barium is a liquid or paste that allows images of your mouth, throat and esophagus to be seen on x-ray. The x-ray captures moving images of the food you are swallowing as it travels from your mouth through your throat and into your esophagus. This test helps identify whether food or liquid is entering your lungs (aspiration). The test also shows which  part of your mouth or throat lacks strength or coordination to move the food or liquid in the right direction. This test typically takes 30 minutes to 1 hour to complete. _______________________________________________________________________  It was a pleasure to see you today!  Alonza Bogus PA-C

## 2020-04-30 NOTE — Progress Notes (Signed)
04/30/2020 Donna Arias [id number] [birth year]   HISTORY OF PRESENT ILLNESS:  This is a pleasant 87 year old female who is here today with her daughter at the request of her PCP, Lorrene Reid, PA-C, for evaluation regarding issues with dysphagia.  She keeps having recurrent issues with bronchitis and ongoing cough.  Her pulmonologist suggested swallowing evaluation so her PCP referred her here.  Her daughter reports that a couple times a week she will get "choked".  She says that she has to be careful to make sure that her food is cut up well into small pieces, etc.  Patient has a hard time exactly describing/relaying symptoms.   Past Medical History:  Diagnosis Date  . Chronic headaches   . COPD (chronic obstructive pulmonary disease) ([place])   . Dementia ([place])   . GERD (gastroesophageal reflux disease)   . Heart attack ([place]) 1998  . HLD (hyperlipidemia)   . Hypertension   . Skin cancer    squamous cell of left face  . Stroke ([place])   . Thyroid disease    Past Surgical History:  Procedure Laterality Date  . BREAST LUMPECTOMY Left 1995  . DILATION AND CURETTAGE OF UTERUS  1975  . [place]    reports that she quit smoking about 27 years ago. Her smoking use included cigarettes. She has never used smokeless tobacco. She reports that she does not drink alcohol and does not use drugs. family history includes Alzheimer's disease in her sister; Breast cancer in her sister; Hypertension in her mother and sister; Stroke in her mother, sister, sister, and sister. Allergies  Allergen Reactions  . Codeine Sulfate     REACTION: unspecified  . Picato [Ingenol Mebutate] Hives      Outpatient Encounter Medications as of 04/30/2020  Medication Sig  . albuterol (PROVENTIL) (2.5 MG/3ML) 0.083% nebulizer solution Take 3 mLs (2.5 mg total) by nebulization every 6 (six) hours as needed for wheezing or shortness of breath.  [name] amLODipine (NORVASC) 2.5 MG tablet TAKE 1 TABLET  (2.5 MG TOTAL) BY MOUTH DAILY.  [name] aspirin 81 MG tablet Take 81 mg by mouth daily.  [name] atorvastatin (LIPITOR) 10 MG tablet TAKE 1 TABLET BY MOUTH DAILY.  . Baclofen 5 MG TABS Take 1 tablet by mouth at bedtime as needed.  . benzonatate (TESSALON) 100 MG capsule Take 1 capsule (100 mg total) by mouth 3 (three) times daily as needed for cough.  . Calcium Carbonate-Vit D-Min (CALCIUM 1200 PO) Take 1 tablet by mouth.  . fexofenadine (ALLEGRA) 180 MG tablet Take 180 mg by mouth daily.  [name] FLUoxetine (PROZAC) 10 MG capsule TAKE 1 CAPSULE BY MOUTH DAILY.  . fluticasone (FLONASE) 50 MCG/ACT nasal spray Place 1 spray into both nostrils 2 (two) times daily.  . furosemide (LASIX) 20 MG tablet Take 1 tablet (20 mg total) by mouth daily.  [name] ketoconazole (NIZORAL) 2 % shampoo Use as directed  . levothyroxine (SYNTHROID) 25 MCG tablet TAKE 1/2 TABLET BY MOUTH DAILY WITH 50 MCG TO EQUAL 62.5 MCG TOTAL DAILY  . levothyroxine (SYNTHROID) 50 MCG tablet TAKE 1 TABLET BY MOUTH EVERY DAY  . potassium chloride SA (KLOR-CON) 20 MEQ tablet TAKE 1 TABLET BY MOUTH ONCE DAILY.  [name] risperiDONE (RISPERDAL) 1 MG tablet TAKE 1 TABLET BY MOUTH 2 TIMES DAILY.  [name] STIOLTO RESPIMAT 2.5-2.5 MCG/ACT AERS Inhale 2 puffs into the lungs daily.  [name] triamcinolone cream (KENALOG) 0.1 % Apply 1 application topically 2 (two) times daily. Request  jar  . [DISCONTINUED] albuterol (PROVENTIL HFA;VENTOLIN HFA) 108 (90 Base) MCG/ACT inhaler Inhale 2 puffs into the lungs every 6 (six) hours as needed.  . [DISCONTINUED] cefUROXime (CEFTIN) 250 MG tablet Take 1 tablet (250 mg total) by mouth 2 (two) times daily with a meal.  . [DISCONTINUED] doxycycline (VIBRA-TABS) 100 MG tablet Take 1 tablet (100 mg total) by mouth 2 (two) times daily.  . [DISCONTINUED] erythromycin ophthalmic ointment Place a 1/2 inch ribbon of ointment into the lower eyelid.  . [DISCONTINUED] loratadine (CLARITIN) 10 MG tablet Take 1 tablet (10 mg total) by mouth daily.  .  [DISCONTINUED] methylPREDNISolone (MEDROL) 4 MG TBPK tablet Take by mouth as directed for 6 days  . [DISCONTINUED] Spacer/Aero-Holding Josiah Lobo DEVI 1 Product by Does not apply route as needed.   Facility-Administered Encounter Medications as of 04/30/2020  Medication  . ipratropium-albuterol (DUONEB) 0.5-2.5 (3) MG/3ML nebulizer solution 3 mL     REVIEW OF SYSTEMS  : All other systems reviewed and negative except where noted in the History of Present Illness.   PHYSICAL EXAM: BP 138/70 (BP Location: Left Arm, Patient Position: Sitting, Cuff Size: Normal)   Pulse 68   Ht 5' 2.5" (1.588 m) Comment: height measured without shoes  Wt 151 lb 2 oz (68.5 kg)   BMI 27.20 kg/m  General: Well developed white female in no acute distress Head: Normocephalic and atraumatic Eyes:  Sclerae anicteric, conjunctiva pink. Ears: Normal auditory acuity Lungs: Clear throughout to auscultation; no W/R/R. Heart: Regular rate and rhythm; no M/R/G. Abdomen: Soft, non-distended.  BS present.  Non-tender Musculoskeletal: Symmetrical with no gross deformities  Skin: No lesions on visible extremities Extremities: No edema  Neurological: Alert oriented x 4, grossly non-focal Psychological:  Alert and cooperative. Normal mood and affect  ASSESSMENT AND PLAN: *87 year old female here for complaints of intermittent dysphagia that sounds mostly oropharyngeal in origin.  Has recurrent issues with bronchitis/cough that they are wondering if it is a consequence of swallowing issues.  Will plan for both barium esophagram and MBSS with speech.  Continue to take caution with eating, taking small bites, chewing food well, etc.   CC:  Lorrene Reid, PA-C

## 2020-05-02 ENCOUNTER — Other Ambulatory Visit (HOSPITAL_COMMUNITY): Payer: Self-pay

## 2020-05-02 DIAGNOSIS — R1310 Dysphagia, unspecified: Secondary | ICD-10-CM

## 2020-05-06 ENCOUNTER — Ambulatory Visit: Payer: Medicare HMO | Admitting: Internal Medicine

## 2020-05-09 ENCOUNTER — Ambulatory Visit (HOSPITAL_COMMUNITY)
Admission: RE | Admit: 2020-05-09 | Discharge: 2020-05-09 | Disposition: A | Discharge status: Home / Self Care | Payer: Medicare HMO | Source: Ambulatory Visit | Attending: Gastroenterology | Admitting: Gastroenterology

## 2020-05-09 ENCOUNTER — Other Ambulatory Visit: Payer: Self-pay | Admitting: Gastroenterology

## 2020-05-09 ENCOUNTER — Other Ambulatory Visit: Payer: Self-pay

## 2020-05-09 DIAGNOSIS — R059 Cough, unspecified: Secondary | ICD-10-CM

## 2020-05-09 DIAGNOSIS — R1310 Dysphagia, unspecified: Secondary | ICD-10-CM

## 2020-05-09 NOTE — Progress Notes (Incomplete)
05/09/20 1000  SLP Visit Information  SLP Received On 05/09/20  Subjective  Subjective pt awake in chair, daughter present  Patient/Family Stated Goal to see about swallowing, pt denies dysphagia, dtr reports some coughing with intake  Pain Assessment  Pain Assessment No/denies pain  General Information  Date of Onset  (chronic)  HPI pt is a 87 yo female referred for MBS by GI due to concern for aspiration.  Pt PMH + for dementia, COPD, skin cancer, CVA, thyroid disease, chronic HA, GERD, MI, frequent episodes of bronchitis and recent chest imaging showing interstitial lung disease.  Daughter reports pt has occasional issues with coughing when eating - not more with liquid vs food. She has maintained her weight and has not needed heimlich manuever.  Type of Study MBS-Modified Barium Swallow Study  Diet Prior to this Study Regular;Thin liquids  Temperature Spikes Noted N/A  Respiratory Status Room air  History of Recent Intubation No  Behavior/Cognition Alert;Cooperative;Distractible;Requires cueing  Oral Cavity Assessment WFL  Oral Care Completed by SLP No  Oral Cavity - Dentition Dentures, top  Vision Functional for self feeding  Self-Feeding Abilities Able to feed self  Patient Positioning Upright in chair  Baseline Vocal Quality Low vocal intensity  Volitional Cough Strong  Volitional Swallow Able to elicit  Anatomy [facility]  Pharyngeal Secretions Not observed secondary MBS  Oral Motor/Sensory Function  Overall Oral Motor/Sensory Function Generalized oral weakness  Facial Symmetry Abnormal symmetry left  Oral Preparation/Oral Phase  Oral Phase Impaired  Oral - Nectar  Oral - Nectar Straw Premature spillage;Piecemeal swallowing;Weak lingual manipulation  Oral - Thin  Oral - Thin Teaspoon Premature spillage;Weak lingual manipulation;Piecemeal swallowing  Oral - Thin Cup Premature spillage;Piecemeal swallowing  Oral - Thin Straw Weak lingual manipulation;Piecemeal  swallowing;Premature spillage  Oral - Solids  Oral - Puree WFL  Oral - Mech Soft Weak lingual manipulation;Impaired mastication  Oral Phase - Comment  Oral Phase - Comment pt with ill fitting top dentures and no lowers - teeth drop from maxillary region when pt opens her mouth  Pharyngeal Phase  Pharyngeal Phase Impaired  Pharyngeal - Nectar  Pharyngeal- Nectar Straw Penetration/Aspiration during swallow;Reduced airway/laryngeal closure;Reduced laryngeal elevation  Pharyngeal Material enters airway, remains ABOVE vocal cords then ejected out  Pharyngeal - Thin  Pharyngeal- Thin Teaspoon Reduced laryngeal elevation;Penetration/Aspiration during swallow;Reduced airway/laryngeal closure  Pharyngeal Material enters airway, passes BELOW cords and not ejected out despite cough attempt by patient  Pharyngeal- Thin Cup Penetration/Aspiration during swallow;Reduced airway/laryngeal closure;Reduced laryngeal elevation  Pharyngeal Material enters airway, passes BELOW cords without attempt by patient to eject out (silent aspiration);Material enters airway, remains ABOVE vocal cords and not ejected out  Pharyngeal- Thin Straw Trace aspiration;Penetration/Aspiration during swallow;Reduced airway/laryngeal closure;Reduced laryngeal elevation  Pharyngeal Material enters airway, CONTACTS cords and not ejected out  Pharyngeal - Solids  Pharyngeal- Puree WFL  Pharyngeal- Mechanical Soft WFL  Pharyngeal Phase - Comment  Pharyngeal Comment chin tuck posture attempted but pt reported discomfort at posterior neck, Head of chair lowered approx ot 45* did not consisently prevent penetration/aspiration  Cervical Esophageal Phase  Cervical Esophageal Phase Impaired  Cervical Esophageal Phase - Comment  Cervical Esophageal Comment appearance of potential CP bar developing, did not confirm with radiologist and did not view until following exam when reviewing MBS  Clinical Impression  Clinical Impression Patient  presents with minimal oropharyngeal dysphagia given advanced age.  Her dysphagia is mostly characterized by decreased oral coordination with premature spillage of liquids into larynx or spilling to  pyriform sinus before triggering swallow Pyriform sinus as not deep and allowed spillage into inadequately closed airway with swallowing liquids.  Trace aspiration observed x3 during testing - once with protective cough response.  Unfortunately, cough did not clear trace aspirates.  Chin tuck not tested due to discomfort for pt in said position.  Pt chronically demonstrates laryngeal penetration during swallowing of nectar and thin liquids.  Very poor fitting upper denture in place - dropping from maxillary gum each time pt opened her mouth, thus impacting her speech and her swallowing.   HOB lowered to approx. 45* did not consistently help pool boluses into pyriform sinus prior to swallow.  Pharyngeal swallow is strong without retention fortunately.  Highly advised daughter to have pt use denture adhesive on her upper dentures or attempt to get new teeth.  Also recommend continue diet as tolerated with general precautions.  Advised pt to consume water as is pH neutral and thus more easily absorbed by lungs if tracely aspirated.  Further advised to monitor pt's tolerance and if she is coughing and uncomfortable with aspiration - attempt slightly thicker liquids to determine if increased comfort.  Advise follow up with OP SlP x2 due to new diagnosis of oropharyngeal dysphagia to help educate pt/family for management. Daughter had many questions which SLP answered to the best of ability but follow up is indicated.  SLP Visit Diagnosis Dysphagia, oropharyngeal phase (R13.12)  Impact on safety and function Mild aspiration risk  Swallow Evaluation Recommendations  SLP Diet Recommendations Dysphagia 3 (Mech soft) solids;Thin liquid  Liquid Administration via Cup;Straw  Medication Administration Whole meds with puree   Supervision Full supervision/cueing for compensatory strategies  Compensations Slow rate;Small sips/bites;Other (Comment)  Postural Changes Seated upright at 90 degrees;Remain semi-upright after after feeds/meals (Comment)  Individuals Consulted  Consulted and Agree with Results and Recommendations Patient;Family member/caregiver  Family Member [employer] Daughter  SLP Time Calculation  SLP Start Time (ACUTE ONLY) 1315  SLP Stop Time (ACUTE ONLY) 1350  SLP Time Calculation (min) (ACUTE ONLY) 35 min  SLP Evaluations  $ SLP Speech Visit 1 Visit  SLP Evaluations  $Outpatient FEES Swallow 1 Procedure  $Swallowing Treatment 1 Procedure

## 2020-05-13 ENCOUNTER — Other Ambulatory Visit: Payer: Self-pay

## 2020-05-13 ENCOUNTER — Ambulatory Visit (INDEPENDENT_AMBULATORY_CARE_PROVIDER_SITE_OTHER): Payer: Medicare HMO | Admitting: Ophthalmology

## 2020-05-13 ENCOUNTER — Encounter (INDEPENDENT_AMBULATORY_CARE_PROVIDER_SITE_OTHER): Payer: Self-pay | Admitting: Ophthalmology

## 2020-05-13 DIAGNOSIS — H353211 Exudative age-related macular degeneration, right eye, with active choroidal neovascularization: Secondary | ICD-10-CM | POA: Diagnosis not present

## 2020-05-13 DIAGNOSIS — H353222 Exudative age-related macular degeneration, left eye, with inactive choroidal neovascularization: Secondary | ICD-10-CM | POA: Diagnosis not present

## 2020-05-13 DIAGNOSIS — R059 Cough, unspecified: Secondary | ICD-10-CM

## 2020-05-13 DIAGNOSIS — H353124 Nonexudative age-related macular degeneration, left eye, advanced atrophic with subfoveal involvement: Secondary | ICD-10-CM | POA: Diagnosis not present

## 2020-05-13 DIAGNOSIS — R1310 Dysphagia, unspecified: Secondary | ICD-10-CM

## 2020-05-13 MED ORDER — BEVACIZUMAB 2.5 MG/0.1ML IZ SOSY
2.5000 mg | PREFILLED_SYRINGE | INTRAVITREAL | Status: AC | PRN
  Administered 2020-05-13: 2.5 mg via INTRAVITREAL

## 2020-05-13 NOTE — Assessment & Plan Note (Signed)
Vastly improved stable acuity on 6-week follow-up post Avastin repeat today and extend interval examination next to 7 weeks

## 2020-05-13 NOTE — Assessment & Plan Note (Signed)
Subfoveal geographic atrophy limits acuity OS

## 2020-05-13 NOTE — Assessment & Plan Note (Signed)
No signs of recurrent CNVM OS

## 2020-05-13 NOTE — Progress Notes (Signed)
Addendum: Reviewed and agree with assessment and management plan. Yannis Gumbs M, MD  

## 2020-05-13 NOTE — Progress Notes (Signed)
05/13/2020     CHIEF COMPLAINT Patient presents for Retina Follow Up (6 Wk F/U OU, poss Avastin OD//Pt denies noticeable changes to [place] OU since last visit. Pt denies ocular pain, flashes of light, or floaters OU. //)   HISTORY OF PRESENT ILLNESS: Donna Arias is a 87 y.o. female who presents to the clinic today for:   HPI    Retina Follow Up    Patient presents with  Wet AMD.  In right eye.  This started 6 weeks ago.  Severity is moderate.  Duration of 6 weeks.  Since onset it is stable. Additional comments: 6 Wk F/U OU, poss Avastin OD  Pt denies noticeable changes to [place] OU since last visit. Pt denies ocular pain, flashes of light, or floaters OU.          Last edited by Rockie Neighbours, [place] on 05/13/2020  9:14 AM. (History)      Referring physician: Lorrene Reid, PA-C [address],  [place] [address]  HISTORICAL INFORMATION:   Selected notes from the MEDICAL RECORD NUMBER    Lab Results  Component Value Date   HGBA1C 5.6 08/07/2019     CURRENT MEDICATIONS: No current outpatient medications on file. (Ophthalmic Drugs)   No current facility-administered medications for this visit. (Ophthalmic Drugs)   Current Outpatient Medications (Other)  Medication Sig  . albuterol (PROVENTIL) (2.5 MG/3ML) 0.083% nebulizer solution Take 3 mLs (2.5 mg total) by nebulization every 6 (six) hours as needed for wheezing or shortness of breath.  [name] amLODipine (NORVASC) 2.5 MG tablet TAKE 1 TABLET (2.5 MG TOTAL) BY MOUTH DAILY.  [name] aspirin 81 MG tablet Take 81 mg by mouth daily.  [name] atorvastatin (LIPITOR) 10 MG tablet TAKE 1 TABLET BY MOUTH DAILY.  . Baclofen 5 MG TABS Take 1 tablet by mouth at bedtime as needed.  . benzonatate (TESSALON) 100 MG capsule Take 1 capsule (100 mg total) by mouth 3 (three) times daily as needed for cough.  . Calcium Carbonate-Vit D-Min (CALCIUM 1200 PO) Take 1 tablet by mouth.  . fexofenadine (ALLEGRA) 180 MG tablet Take 180 mg by mouth  daily.  [name] FLUoxetine (PROZAC) 10 MG capsule TAKE 1 CAPSULE BY MOUTH DAILY.  . fluticasone (FLONASE) 50 MCG/ACT nasal spray Place 1 spray into both nostrils 2 (two) times daily.  . furosemide (LASIX) 20 MG tablet Take 1 tablet (20 mg total) by mouth daily.  [name] ketoconazole (NIZORAL) 2 % shampoo Use as directed  . levothyroxine (SYNTHROID) 25 MCG tablet TAKE 1/2 TABLET BY MOUTH DAILY WITH 50 MCG TO EQUAL 62.5 MCG TOTAL DAILY  . levothyroxine (SYNTHROID) 50 MCG tablet TAKE 1 TABLET BY MOUTH EVERY DAY  . potassium chloride SA (KLOR-CON) 20 MEQ tablet TAKE 1 TABLET BY MOUTH ONCE DAILY.  [name] risperiDONE (RISPERDAL) 1 MG tablet TAKE 1 TABLET BY MOUTH 2 TIMES DAILY.  [name] STIOLTO RESPIMAT 2.5-2.5 MCG/ACT AERS Inhale 2 puffs into the lungs daily.  [name] triamcinolone cream (KENALOG) 0.1 % Apply 1 application topically 2 (two) times daily. Request jar   Current Facility-Administered Medications (Other)  Medication Route  . ipratropium-albuterol (DUONEB) 0.5-2.5 (3) MG/3ML nebulizer solution 3 mL Nebulization      REVIEW OF SYSTEMS:    ALLERGIES Allergies  Allergen Reactions  . Codeine Sulfate     REACTION: unspecified  . Picato [Ingenol Mebutate] Hives    PAST MEDICAL HISTORY Past Medical History:  Diagnosis Date  . Chronic headaches   . COPD (  chronic obstructive pulmonary disease) ([place])   . Dementia ([place])   . GERD (gastroesophageal reflux disease)   . Heart attack ([place]) 1998  . HLD (hyperlipidemia)   . Hypertension   . Skin cancer    squamous cell of left face  . Stroke ([place])   . Thyroid disease    Past Surgical History:  Procedure Laterality Date  . BREAST LUMPECTOMY Left 1995  . DILATION AND CURETTAGE OF UTERUS  1975  . GALLBLADDER SURGERY  1993    FAMILY HISTORY Family History  Problem Relation Age of Onset  . Hypertension Mother   . Stroke Mother   . Stroke Sister   . Alzheimer's disease Sister   . Stroke Sister   . Hypertension Sister   . Stroke Sister   . Breast  cancer Sister     SOCIAL HISTORY Social History   Tobacco Use  . Smoking status: Former Smoker    Types: Cigarettes    Quit date: 1995    Years since quitting: 27.3  . Smokeless tobacco: Never Used  Vaping Use  . Vaping Use: Never used  Substance Use Topics  . Alcohol use: No  . Drug use: No         OPHTHALMIC EXAM: Base Eye Exam    Visual Acuity (ETDRS)      Right Left   Dist cc 20/50 +1 E card @ 4'   Dist ph cc NI NI   Correction: Glasses       Tonometry (Tonopen, 9:18 AM)      Right Left   Pressure 12 14       Pupils      Pupils Dark Light Shape React APD   Right PERRL 4 3 Round Brisk None   Left PERRL 4 3 Round Brisk None       Visual Fields (Counting fingers)   Poor pt understanding       Extraocular Movement      Right Left    Full Full       Neuro/Psych    Oriented x3: Yes   Mood/Affect: Normal       Dilation    Both eyes: 1.0% Mydriacyl, 2.5% Phenylephrine @ 9:18 AM        Slit Lamp and Fundus Exam    External Exam      Right Left   External Normal Normal       Slit Lamp Exam      Right Left   Lids/Lashes Normal Normal   Conjunctiva/Sclera White and quiet White and quiet   Cornea Clear Clear   Anterior Chamber Deep and quiet Deep and quiet   Iris Round and reactive Round and reactive   Lens Centered posterior chamber intraocular lens Centered posterior chamber intraocular lens   Anterior Vitreous Normal Normal       Fundus Exam      Right Left   Posterior Vitreous Posterior vitreous detachment Posterior vitreous detachment   Disc Peripapillary atrophy Peripapillary atrophy   C/D Ratio 0.65 0.6   Macula Retinal pigment epithelial mottling, no macular thickening, Retinal atrophy, Early age related macular degeneration, no serous retinal detachment visible clinically, minor epiretinal membrane not distorting Advanced age related macular degeneration, Retinal pigment epithelial atrophy - geographic in the subfoveal location    Vessels Normal Normal   Periphery Normal Normal          IMAGING AND PROCEDURES  Imaging and Procedures for 05/13/20  OCT, Retina - OU - Both  Eyes       Right Eye Quality was good. Scan locations included subfoveal. Central Foveal Thickness: 277. Progression has improved. Findings include abnormal foveal contour.   Left Eye Quality was good. Scan locations included subfoveal. Central Foveal Thickness: 178. Progression has been stable.   Notes Less subretinal fluid, now resolved and perifoveal CME completely resolved as compared to findings November 2021. Currently at 6-week interval. Repeat injection Avastin today and examination again in 7 weeks to maintain acuity in this best functioning eye  OS with macular atrophy from previous retinal vascular disease.  Stable overall       Intravitreal Injection, Pharmacologic Agent - OD - Right Eye       Time Out 05/13/2020. 10:03 AM. Confirmed correct patient, procedure, site, and patient consented.   Anesthesia Topical anesthesia was used. Anesthetic medications included [place] 3.5%.   Procedure Preparation included Ofloxacin , Tobramycin 0.3%, 10% betadine to eyelids, 5% betadine to ocular surface. A 30 gauge needle was used.   Injection:  2.5 mg Bevacizumab (AVASTIN) 2.5mg /0.9mL SOSY   NDC: 25427-062-37, Lot: [id number]   Route: Intravitreal, Site: Right Eye  Post-op Post injection exam found visual acuity of at least counting fingers. The patient tolerated the procedure well. There were no complications. The patient received written and verbal post procedure care education. Post injection medications were not given.                 ASSESSMENT/PLAN:  Advanced nonexudative age-related macular degeneration of left eye with subfoveal involvement Subfoveal geographic atrophy limits acuity OS  Exudative age-related macular degeneration of left eye with inactive choroidal neovascularization (HCC) No signs of recurrent  CNVM OS  Exudative age-related macular degeneration of right eye with active choroidal neovascularization (HCC) Vastly improved stable acuity on 6-week follow-up post Avastin repeat today and extend interval examination next to 7 weeks      ICD-10-CM   1. Exudative age-related macular degeneration of right eye with active choroidal neovascularization (HCC)  H35.3211 OCT, Retina - OU - Both Eyes    Intravitreal Injection, Pharmacologic Agent - OD - Right Eye    bevacizumab (AVASTIN) SOSY 2.5 mg  2. Advanced nonexudative age-related macular degeneration of left eye with subfoveal involvement  H35.3124   3. Exudative age-related macular degeneration of left eye with inactive choroidal neovascularization (HCC)  H35.3222     1.  OS, overall stable with dry central foveal atrophy accounting for acuity, no signs of active disease on the edges of the atrophic macula  2.  OD, vastly improved compared to onset of CNVM wet AMD November 2021.  Improved anatomy and acuity  On 6-week interval today we will repeat injection Avastin and examination again OD in 7 weeks  3.  Ophthalmic Meds Ordered this visit:  Meds ordered this encounter  Medications  . bevacizumab (AVASTIN) SOSY 2.5 mg       Return in about 7 weeks (around 07/01/2020) for dilate, OD, AVASTIN OCT.  There are no Patient Instructions on file for this visit.   Explained the diagnoses, plan, and follow up with the patient and they expressed understanding.  Patient expressed understanding of the importance of proper follow up care.   Clent Demark Aster Screws M.D. Diseases & Surgery of the Retina and Vitreous Retina & Diabetic [place] 05/13/20     Abbreviations: M myopia (nearsighted); A astigmatism; H hyperopia (farsighted); P presbyopia; Mrx spectacle prescription;  CTL contact lenses; OD right eye; OS left eye; OU both eyes  XT  exotropia; ET esotropia; PEK punctate epithelial keratitis; PEE punctate epithelial erosions; DES dry eye  syndrome; MGD meibomian gland dysfunction; ATs artificial tears; PFAT's preservative free artificial tears; [place] nuclear sclerotic cataract; PSC posterior subcapsular cataract; ERM epi-retinal membrane; PVD posterior vitreous detachment; RD retinal detachment; DM diabetes mellitus; DR diabetic retinopathy; NPDR non-proliferative diabetic retinopathy; PDR proliferative diabetic retinopathy; CSME clinically significant macular edema; DME diabetic macular edema; dbh dot blot hemorrhages; CWS cotton wool spot; POAG primary open angle glaucoma; C/D cup-to-disc ratio; HVF humphrey visual field; GVF goldmann visual field; OCT optical coherence tomography; IOP intraocular pressure; BRVO Branch retinal vein occlusion; CRVO central retinal vein occlusion; CRAO central retinal artery occlusion; BRAO branch retinal artery occlusion; RT retinal tear; SB scleral buckle; PPV pars plana vitrectomy; VH Vitreous hemorrhage; PRP panretinal laser photocoagulation; IVK intravitreal kenalog; VMT vitreomacular traction; MH Macular hole;  NVD neovascularization of the disc; NVE neovascularization elsewhere; AREDS age related eye disease study; ARMD age related macular degeneration; POAG primary open angle glaucoma; EBMD epithelial/anterior basement membrane dystrophy; ACIOL anterior chamber intraocular lens; IOL intraocular lens; PCIOL posterior chamber intraocular lens; Phaco/IOL phacoemulsification with intraocular lens placement; [place] photorefractive keratectomy; LASIK laser assisted in situ keratomileusis; HTN hypertension; DM diabetes mellitus; COPD chronic obstructive pulmonary disease

## 2020-05-19 ENCOUNTER — Other Ambulatory Visit: Payer: Self-pay | Admitting: Physician Assistant

## 2020-05-19 ENCOUNTER — Telehealth: Payer: Self-pay | Admitting: Physician Assistant

## 2020-05-19 DIAGNOSIS — Z760 Encounter for issue of repeat prescription: Secondary | ICD-10-CM

## 2020-05-19 NOTE — Telephone Encounter (Signed)
Patient's daughter would like to know if patient needs any appointments. I did not see any notes or anything indicating patient needs an appointment. Thanks

## 2020-05-19 NOTE — Telephone Encounter (Signed)
Patient scheduled.

## 2020-05-19 NOTE — Telephone Encounter (Signed)
Per last chronic follow up : Return in about 6 months (around 02/14/2020) for HTN, HLD, CKD, hypothyroid and FBW (lipid panel, cmp); lab visit in 4 weeks to recheck CMP, CBC.       Yes, Patient needs apt for HTN, HLD, CKD, Thyroid. Please contact pt to schedule appropriately. AS, CMA

## 2020-05-23 ENCOUNTER — Other Ambulatory Visit: Payer: Self-pay

## 2020-05-23 ENCOUNTER — Other Ambulatory Visit: Payer: Medicare HMO

## 2020-05-23 DIAGNOSIS — E785 Hyperlipidemia, unspecified: Secondary | ICD-10-CM

## 2020-05-23 DIAGNOSIS — I10 Essential (primary) hypertension: Secondary | ICD-10-CM | POA: Diagnosis not present

## 2020-05-23 DIAGNOSIS — Z0000 Encounter for general adult medical examination without abnormal findings: Secondary | ICD-10-CM

## 2020-05-24 LAB — CBC WITH DIFFERENTIAL/PLATELET
Basophils Absolute: 0 10*3/uL (ref 0.0–0.2)
Basos: 1 %
EOS (ABSOLUTE): 0.2 10*3/uL (ref 0.0–0.4)
Eos: 2 %
Hematocrit: 41.9 % (ref 34.0–46.6)
Hemoglobin: 14.1 g/dL (ref 11.1–15.9)
Immature Grans (Abs): 0 10*3/uL (ref 0.0–0.1)
Immature Granulocytes: 0 %
Lymphocytes Absolute: 2.1 10*3/uL (ref 0.7–3.1)
Lymphs: 31 %
MCH: 31.3 pg (ref 26.6–33.0)
MCHC: 33.7 g/dL (ref 31.5–35.7)
MCV: 93 fL (ref 79–97)
Monocytes Absolute: 0.5 10*3/uL (ref 0.1–0.9)
Monocytes: 7 %
Neutrophils Absolute: 3.9 10*3/uL (ref 1.4–7.0)
Neutrophils: 59 %
Platelets: 190 10*3/uL (ref 150–450)
RBC: 4.51 x10E6/uL (ref 3.77–5.28)
RDW: 14.5 % (ref 11.7–15.4)
WBC: 6.7 10*3/uL (ref 3.4–10.8)

## 2020-05-24 LAB — COMPREHENSIVE METABOLIC PANEL
ALT: 13 IU/L (ref 0–32)
AST: 16 IU/L (ref 0–40)
Albumin/Globulin Ratio: 1.5 (ref 1.2–2.2)
Albumin: 3.9 g/dL (ref 3.5–4.6)
Alkaline Phosphatase: 86 IU/L (ref 44–121)
BUN/Creatinine Ratio: 17 (ref 12–28)
BUN: 20 mg/dL (ref 10–36)
Bilirubin Total: 0.5 mg/dL (ref 0.0–1.2)
CO2: 22 mmol/L (ref 20–29)
Calcium: 10 mg/dL (ref 8.7–10.3)
Chloride: 104 mmol/L (ref 96–106)
Creatinine, Ser: 1.21 mg/dL — ABNORMAL HIGH (ref 0.57–1.00)
Globulin, Total: 2.6 g/dL (ref 1.5–4.5)
Glucose: 95 mg/dL (ref 65–99)
Potassium: 4.2 mmol/L (ref 3.5–5.2)
Sodium: 145 mmol/L — ABNORMAL HIGH (ref 134–144)
Total Protein: 6.5 g/dL (ref 6.0–8.5)
eGFR: 42 mL/min/{1.73_m2} — ABNORMAL LOW (ref >59)

## 2020-05-24 LAB — LIPID PANEL
Chol/HDL Ratio: 2.6 ratio (ref 0.0–4.4)
Cholesterol, Total: 156 mg/dL (ref 100–199)
HDL: 61 mg/dL (ref >39)
LDL Chol Calc (NIH): 80 mg/dL (ref 0–99)
Triglycerides: 76 mg/dL (ref 0–149)
VLDL Cholesterol Cal: 15 mg/dL (ref 5–40)

## 2020-05-26 ENCOUNTER — Other Ambulatory Visit: Payer: Self-pay

## 2020-05-26 ENCOUNTER — Ambulatory Visit: Payer: Medicare HMO | Attending: Gastroenterology

## 2020-05-26 DIAGNOSIS — R1312 Dysphagia, oropharyngeal phase: Secondary | ICD-10-CM | POA: Insufficient documentation

## 2020-05-26 NOTE — Therapy (Signed)
[facility] [address] [place], [place], [address] Phone: [phone]   Fax:  [phone]  Speech Language Pathology Evaluation  Patient Details  Name: Donna Arias MRN: [id number] Date of Birth: 1926/11/15 Referring Provider (SLP): Loralie Champagne, PA-C   Encounter Date: 05/26/2020   End of Session - 05/26/20 1509    Visit Number 1    Number of Visits 1    SLP Start Time 1100    SLP Stop Time  1145    SLP Time Calculation (min) 45 min    Activity Tolerance Patient tolerated treatment well           Past Medical History:  Diagnosis Date  . Chronic headaches   . COPD (chronic obstructive pulmonary disease) ([place])   . Dementia ([place])   . GERD (gastroesophageal reflux disease)   . Heart attack ([place]) 1998  . HLD (hyperlipidemia)   . Hypertension   . Skin cancer    squamous cell of left face  . Stroke ([place])   . Thyroid disease     Past Surgical History:  Procedure Laterality Date  . BREAST LUMPECTOMY Left 1995  . DILATION AND CURETTAGE OF UTERUS  1975  . GALLBLADDER SURGERY  1993    There were no vitals filed for this visit.       SLP Evaluation [facility] - 05/26/20 1505      SLP Visit Information   SLP Received On 05/13/20    Referring Provider (SLP) Loralie Champagne, PA-C    Onset Date Fall 2021    Medical Diagnosis Oropharyngeal dysphagia      General Information   HPI Pt is a 87 yo female referred for MBS by GI due to concern for aspiration.  Pt PMH + for dementia, COPD, skin cancer, CVA, thyroid disease, chronic HA, GERD, MI, frequent episodes of bronchitis and recent chest imaging showing interstitial lung disease.  Daughter reports pt has occasional issues with coughing when eating - not more with liquid vs food. She has maintained her weight,  has not needed heimlich manuever, and has not had PNA in years.      Balance Screen   Has the patient fallen in the past 6 months No      Prior  Functional Status   Cognitive/Linguistic Baseline Baseline deficits    Baseline deficit details --   dementia   Type of Home House     Lives With [facility] - daughter   Available Support Family    Education 5th grade    Vocation Retired   farmer     Cognition   Overall Cognitive Status History of cognitive impairments - at baseline      Auditory Comprehension   Overall Auditory Comprehension Impaired at baseline      Verbal Expression   Overall Verbal Expression Appears within functional limits for tasks assessed      Oral Motor/Sensory Function   Overall Oral Motor/Sensory Function Impaired    Labial ROM Reduced right;Reduced left    Labial Symmetry Within Functional Limits    Labial Strength Reduced    Labial Coordination Reduced    Lingual ROM Within Functional Limits    Lingual Symmetry Within Functional Limits    Lingual Strength Reduced    Lingual Sensation Within Functional Limits    Lingual Coordination Reduced    Facial Symmetry Within Functional Limits      Motor Speech   Overall Motor Speech Appears within  functional limits for tasks assessed           MBSS 05-09-20   Patient presents with minimal oropharyngeal dysphagia given advanced age.  Her dysphagia is mostly characterized by decreased oral coordination with premature spillage of liquids into larynx or spilling to pyriform sinus before triggering swallow Pyriform sinus as not deep and allowed spillage into inadequately closed airway with swallowing liquids.  Trace aspiration observed x3 during testing - once with protective cough response.  Unfortunately, cough did not clear trace aspirates.  Chin tuck not tested due to discomfort for pt in said position.  Pt chronically demonstrates laryngeal penetration during swallowing of nectar and thin liquids.  Very poor fitting upper denture in place - dropping from maxillary gum each time pt opened her mouth, thus impacting her speech and her swallowing.   HOB  lowered to approx. 45* did not consistently help pool boluses into pyriform sinus prior to swallow.  Pharyngeal swallow is strong without retention fortunately.  Highly advised daughter to have pt use denture adhesive on her upper dentures or attempt to get new teeth.  Also recommend continue diet as tolerated with general precautions.  Advised pt to consume water as is pH neutral and thus more easily absorbed by lungs if tracely aspirated.  Further advised to monitor pt's tolerance and if she is coughing and uncomfortable with aspiration - attempt slightly thicker liquids to determine if increased comfort.  Advise follow up with OP SlP x2 due to new diagnosis of oropharyngeal dysphagia to help educate pt/family for management. Daughter had many questions which SLP answered to the best of ability but follow up is indicated.  SLP Diet Recommendations Dysphagia 3 (Mech soft) solids;Thin liquid  Liquid Administration via Cup;Straw  Medication Administration Whole meds with puree  Supervision Full supervision/cueing for compensatory strategies  Compensations Slow rate;Small sips/bites;Other (Comment)  Postural Changes Seated upright at 90 degrees;Remain semi-upright after after feeds/meals (Comment)                  SLP Education - 05/26/20 1509    Education Details pillars of aspiration, recommended swallow strategies, diet texture recommendations    Person(s) Educated Patient;Child(ren)    Methods Explanation;Demonstration;Handout    Comprehension Verbalized understanding;Returned demonstration;Need further instruction                Plan - 05/26/20 1511    Clinical Impression Statement Donna Arias presents for OPST evaluation secondary to recent diagnosis of minimal oropharyngeal dysphagia. Recent MBSS completed on 05-09-20 with recommendation for dysphagia 3 (mech soft) and thin liquids (see full report above). SLP who conducted MBSS recommended follow up with OP SLP due to new  diagnosis of oropharyngeal dysphagia to help educate pt/family for management. SLP reviewed MBSS results and recommendations with patient and her daughter, [name]. [name] reports pt requires direct, constant supervision and cueing due to cognitive deficits impacting safety during meals (ex: over-filling oral cavity). Pt's daughter reportedly provides cues to slow rate, take small bites, alternate liquids and solids, and cuts food into appropriate bite sizes. Of note, improved denture fit noted since MBSS, with use of strips to adhere upper denture to gum ridge. Pt has baseline COPD and sinus congestion; however, no recent PNA reported or indicated in medical documentation. SLP educated patient and her daughter on 3 pillars of aspiration, with particular focus on thorough oral care. SLP provided education and handout re: s/sx of aspiration to monitor. SLP trialed effortful swallows and 3 second oral hold prior to swallow,  which were ineffective due to cognitive deficits. Occasional wet vocal quality noted during PO trials, in which SLP educated daughter on providing cued cough/throat clear. Clear vocal quality assessed following cued throat clear. Given pt's advanced age and overall age appropriate functional swallow without recent PNA, SLP recommends maintanence via recommended swallow strategies. Pt's daughter verbalized understanding of all education and denied any further questions. SLP educated daughter that an additional script for ST services may be requested if patient presents with decline in swallow function. No further skilled ST intervention warranted at this time as pt is at baseline for age related swallow function with compensations in place.    Speech Therapy Frequency One time visit    Treatment/Interventions Aspiration precaution training;Compensatory strategies    Potential to Achieve Goals Fair    Potential Considerations Ability to learn/carryover information;Medical prognosis    SLP Home  Exercise Plan provided    Consulted and Agree with Plan of Care Patient;Family member/caregiver           Patient will benefit from skilled therapeutic intervention in order to improve the following deficits and impairments:   Dysphagia, oropharyngeal phase    Problem List Patient Active Problem List   Diagnosis Date Noted  . Dysphagia 04/30/2020  . Acute recurrent frontal sinusitis 04/18/2020  . Impacted cerumen of right ear 04/18/2020  . Exudative age-related macular degeneration of right eye with active choroidal neovascularization ([place]) 12/03/2019  . Exudative age-related macular degeneration of left eye with inactive choroidal neovascularization ([place]) 06/04/2019  . Posterior vitreous detachment of right eye 06/04/2019  . 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 Advanced nonexudative age-related macular degeneration of left eye with subfoveal involvement 06/04/2019  . Intermediate stage nonexudative age-related macular degeneration of right eye 06/04/2019  . Excessive cerumen in ear canal, bilateral 11/06/2018  . Eye infection, left 01/16/2018  . Ear fullness, left 01/16/2018  . Multiple atypical nevi 06/23/2017  . Ankle edema, bilateral 05/02/2017  . Postnasal drip 12/13/2016  . Acute bronchitis with COPD ([place]) 12/13/2016  . Orthostatic hypotension 11/22/2016  . Hyperlipidemia 11/22/2016  . Anxiety 11/22/2016  . Dysarthria 11/22/2016  . Weight gain 11/18/2016  . Edema of right foot 11/18/2016  . Age-related osteoporosis without current pathological fracture 11/16/2016  . Cough 11/16/2016  . Healthcare maintenance 10/14/2016  . Diaper dermatitis 06/10/2016  . Antibiotic-induced yeast infection 06/10/2016  . Hearing loss secondary to cerumen impaction, bilateral 06/10/2016  . Chronic bilateral low back pain 04/19/2016  . Chronic neck pain 04/19/2016  . Medication refill 04/14/2016  . Joint laxity of right knee 04/14/2016  . Hypothyroidism 02/04/2016  . Upper  back pain 08/01/2009  . Transient cerebral ischemia 09/22/2007  . Dementia with behavioral disturbance ([place]) 10/07/2006  . Essential hypertension 10/07/2006  . COPD exacerbation ([place]) 10/07/2006  . GERD 10/07/2006    Alinda Deem, MA CCC-SLP 05/26/2020, 4:36 PM  [facility] [address] [place], [place], [address] Phone: [phone]   Fax:  [phone]  Name: TANITH DAGOSTINO MRN: [id number] Date of Birth: 06-19-1926

## 2020-05-26 NOTE — Patient Instructions (Signed)
  Swallow strategies: -Constant Supervision -Cut food into small bites  -Small, single sips -Alternative bites and sips -Swallow hard  Good oral care is super important. Clean mouth before drinking things other than water.  Water is best option for now.     Signs of Aspiration Pneumonia   . Chest pain/tightness . Fever (can be low grade) . Cough  o With foul-smelling phlegm (sputum) o With sputum containing pus or blood o With greenish sputum . Fatigue  . Shortness of breath  . Wheezing   **IF YOU HAVE THESE SIGNS, CONTACT YOUR DOCTOR OR GO TO THE EMERGENCY DEPARTMENT OR URGENT CARE AS SOON AS POSSIBLE**

## 2020-05-27 ENCOUNTER — Ambulatory Visit: Payer: Medicare HMO | Admitting: Physician Assistant

## 2020-05-27 ENCOUNTER — Encounter: Payer: Self-pay | Admitting: Physician Assistant

## 2020-05-27 VITALS — BP 126/79 | HR 66 | Temp 97.4°F | Ht 64.0 in | Wt 155.0 lb

## 2020-05-27 DIAGNOSIS — R1310 Dysphagia, unspecified: Secondary | ICD-10-CM | POA: Diagnosis not present

## 2020-05-27 DIAGNOSIS — I10 Essential (primary) hypertension: Secondary | ICD-10-CM | POA: Diagnosis not present

## 2020-05-27 DIAGNOSIS — E039 Hypothyroidism, unspecified: Secondary | ICD-10-CM

## 2020-05-27 DIAGNOSIS — N1832 Chronic kidney disease, stage 3b: Secondary | ICD-10-CM | POA: Diagnosis not present

## 2020-05-27 DIAGNOSIS — E785 Hyperlipidemia, unspecified: Secondary | ICD-10-CM | POA: Diagnosis not present

## 2020-05-27 NOTE — Progress Notes (Signed)
Established Patient Office Visit  Subjective:  Patient ID: Donna Arias, female    DOB: September 10, 1926  Age: 87 y.o. MRN: [id number]  CC:  Chief Complaint  Patient presents with  . Follow-up  . Hyperlipidemia  . Hypertension  . Thyroid Problem    HPI Donna Arias presents for follow up on hypertension, hyperlipidemia and discuss most recent labs. Patient is accompanied by her daughter, [name]. Patient has been evaluated for dysphagia and recently saw Speech therapist to learn appropriate ways of drinking to avoid aspiration.   HTN: Pt denies chest pain, palpitations, dizziness or increased edema. Taking medication as directed without side effects. Pt tries to follow a low salt diet and drink water.   HLD: Pt taking medication as directed without issues. Patient has oatmeal for breakfast several times per week. Also likes to have eggs for breakfast.  Thyroid: Denies palpations, hair changes, heat/cold intolerance or weight changes. Reports medication compliance.     Past Medical History:  Diagnosis Date  . Chronic headaches   . COPD (chronic obstructive pulmonary disease) ([place])   . Dementia ([place])   . GERD (gastroesophageal reflux disease)   . Heart attack ([place]) 1998  . HLD (hyperlipidemia)   . Hypertension   . Skin cancer    squamous cell of left face  . Stroke ([place])   . Thyroid disease     Past Surgical History:  Procedure Laterality Date  . BREAST LUMPECTOMY Left 1995  . DILATION AND CURETTAGE OF UTERUS  1975  . GALLBLADDER SURGERY  1993    Family History  Problem Relation Age of Onset  . Hypertension Mother   . Stroke Mother   . Stroke Sister   . Alzheimer's disease Sister   . Stroke Sister   . Hypertension Sister   . Stroke Sister   . Breast cancer Sister     Social History   Socioeconomic History  . Marital status: Widowed    Spouse name: Not on file  . Number of children: 1  . Years of education: Not on file  . Highest education level: Not on  file  Occupational History  . Occupation: RETIRED  Tobacco Use  . Smoking status: Former Smoker    Types: Cigarettes    Quit date: 1995    Years since quitting: 27.3  . Smokeless tobacco: Never Used  Vaping Use  . Vaping Use: Never used  Substance and Sexual Activity  . Alcohol use: No  . Drug use: No  . Sexual activity: Never    Birth control/protection: None  Other Topics Concern  . Not on file  Social History Narrative   CB x 1   Lives at home with her daughter (since 02/05/2013)   Right handed   Regular exercise - NO   Social Determinants of Health   Financial Resource Strain: Not on file  Food Insecurity: Not on file  Transportation Needs: Not on file  Physical Activity: Not on file  Stress: Not on file  Social Connections: Not on file  Intimate Partner Violence: Not on file    Outpatient Medications Prior to Visit  Medication Sig Dispense Refill  . albuterol (PROVENTIL) (2.5 MG/3ML) 0.083% nebulizer solution Take 3 mLs (2.5 mg total) by nebulization every 6 (six) hours as needed for wheezing or shortness of breath. 75 mL 6  . amLODipine (NORVASC) 2.5 MG tablet TAKE 1 TABLET (2.5 MG TOTAL) BY MOUTH DAILY. 90 tablet 0  . aspirin 81 MG tablet Take 81  mg by mouth daily.    [name] atorvastatin (LIPITOR) 10 MG tablet TAKE 1 TABLET BY MOUTH DAILY. 90 tablet 0  . Baclofen 5 MG TABS Take 1 tablet by mouth at bedtime as needed. 30 tablet 0  . benzonatate (TESSALON) 100 MG capsule Take 1 capsule (100 mg total) by mouth 3 (three) times daily as needed for cough. 30 capsule 0  . Calcium Carbonate-Vit D-Min (CALCIUM 1200 PO) Take 1 tablet by mouth.    . fexofenadine (ALLEGRA) 180 MG tablet Take 180 mg by mouth daily.    [name] FLUoxetine (PROZAC) 10 MG capsule TAKE 1 CAPSULE BY MOUTH DAILY. 90 capsule 0  . fluticasone (FLONASE) 50 MCG/ACT nasal spray Place 1 spray into both nostrils 2 (two) times daily. 16 g 6  . furosemide (LASIX) 20 MG tablet TAKE 1 TABLET BY MOUTH DAILY. 90 tablet 0   . ketoconazole (NIZORAL) 2 % shampoo Use as directed 120 mL 1  . levothyroxine (SYNTHROID) 25 MCG tablet TAKE 1/2 TABLET BY MOUTH DAILY WITH 50 MCG TO EQUAL 62.5 MCG TOTAL DAILY 45 tablet 0  . levothyroxine (SYNTHROID) 50 MCG tablet TAKE 1 TABLET BY MOUTH EVERY DAY 90 tablet 0  . potassium chloride SA (KLOR-CON) 20 MEQ tablet TAKE 1 TABLET BY MOUTH ONCE DAILY. 90 tablet 0  . risperiDONE (RISPERDAL) 1 MG tablet TAKE 1 TABLET BY MOUTH 2 TIMES DAILY. 120 tablet 0  . STIOLTO RESPIMAT 2.5-2.5 MCG/ACT AERS Inhale 2 puffs into the lungs daily. 4 g 2  . triamcinolone cream (KENALOG) 0.1 % Apply 1 application topically 2 (two) times daily. Request jar 453 g 0   Facility-Administered Medications Prior to Visit  Medication Dose Route Frequency Provider Last Rate Last Admin  . ipratropium-albuterol (DUONEB) 0.5-2.5 (3) MG/3ML nebulizer solution 3 mL  3 mL Nebulization Q6H Danford, Katy D, NP   3 mL at 12/08/17 0958    Allergies  Allergen Reactions  . Codeine Sulfate     REACTION: unspecified  . Picato [Ingenol Mebutate] Hives    ROS Review of Systems A fourteen system review of systems was performed and found to be positive as per HPI.   Objective:    Physical Exam General:  Pleasant and cooperative, in no acute distress  Neuro:  Alert and oriented,  extra-ocular muscles intact  HEENT:  Normocephalic, atraumatic, neck supple Skin:  no gross rash, warm, pink. Cardiac:  RRR, S1 S2 Respiratory:  ECTA B/L w/o wheezing, Not using accessory muscles, speaking in full sentences- unlabored. Vascular:  Ext warm, no cyanosis apprec.; cap RF less 2 sec. Psych:  No HI/SI, judgement and insight good, Euthymic mood. Full Affect.   BP 126/79   Pulse 66   Temp (!) 97.4 F (36.3 C)   Ht '5\' 4"'  (1.626 m)   Wt 155 lb (70.3 kg)   SpO2 98%   BMI 26.61 kg/m  Wt Readings from Last 3 Encounters:  05/27/20 155 lb (70.3 kg)  04/30/20 151 lb 2 oz (68.5 kg)  04/15/20 151 lb 12.8 oz (68.9 kg)     Health  Maintenance Due  Topic Date Due  . TETANUS/TDAP  02/25/2018  . COVID-19 Vaccine (4 - Booster for Pfizer series) 10/31/2019    There are no preventive care reminders to display for this patient.  Lab Results  Component Value Date   TSH 2.790 08/07/2019   Lab Results  Component Value Date   WBC 6.7 05/23/2020   HGB 14.1 05/23/2020   HCT 41.9 05/23/2020  MCV 93 05/23/2020   PLT 190 05/23/2020   Lab Results  Component Value Date   NA 145 (H) 05/23/2020   K 4.2 05/23/2020   CO2 22 05/23/2020   GLUCOSE 95 05/23/2020   BUN 20 05/23/2020   CREATININE 1.21 (H) 05/23/2020   BILITOT 0.5 05/23/2020   ALKPHOS 86 05/23/2020   AST 16 05/23/2020   ALT 13 05/23/2020   PROT 6.5 05/23/2020   ALBUMIN 3.9 05/23/2020   CALCIUM 10.0 05/23/2020   EGFR 42 (L) 05/23/2020   GFR 45.15 (L) 08/01/2015   Lab Results  Component Value Date   CHOL 156 05/23/2020   Lab Results  Component Value Date   HDL 61 05/23/2020   Lab Results  Component Value Date   LDLCALC 80 05/23/2020   Lab Results  Component Value Date   TRIG 76 05/23/2020   Lab Results  Component Value Date   CHOLHDL 2.6 05/23/2020   Lab Results  Component Value Date   HGBA1C 5.6 08/07/2019      Assessment & Plan:   Problem List Items Addressed This Visit      Cardiovascular and Mediastinum   Essential hypertension - Primary     Digestive   Dysphagia     Endocrine   Hypothyroidism     Other   Hyperlipidemia    Other Visit Diagnoses    Stage 3b chronic kidney disease ([place])         Essential hypertension: -Controlled. -Continue current medication regimen. -Will continue to monitor. -Recent CMP stable, recommend to increase water hydration.   Hyperlipidemia: -Recent lipid panel wnl's, LDL 80 -Continue current medication regimen. Recent hepatic function normal. -Will continue to monitor.  Hypothyroidism: -Last TSH wnl's, pt asymptomatic, continue current medication.  -Will continue to monitor and  repeat TSH at follow up visit.  Stage 3b CKD: -Recent Cr 1.21. eGFR 42, stable. -Continue to avoid NSAIDs. -Will continue to monitor.  Dysphagia:  -Reviewed barium swallow and swallow function test 05/09/2020- decreased esophageal motility, neg stricture or reflux.  -Recommend to continue SLP recommendations.     No orders of the defined types were placed in this encounter.   Follow-up: Return in about 6 months (around 11/27/2020) for [place] .   Note:  This note was prepared with assistance of Dragon voice recognition software. Occasional wrong-word or sound-a-like substitutions may have occurred due to the inherent limitations of voice recognition software.   Lorrene Reid, PA-C

## 2020-05-28 ENCOUNTER — Other Ambulatory Visit: Payer: Self-pay | Admitting: Adult Health

## 2020-05-28 ENCOUNTER — Telehealth: Payer: Self-pay | Admitting: Physician Assistant

## 2020-05-28 ENCOUNTER — Other Ambulatory Visit: Payer: Self-pay | Admitting: Physician Assistant

## 2020-05-28 NOTE — Telephone Encounter (Signed)
Patient needs a refill on Baclofen and uses [facility], please advise thanks.

## 2020-05-28 NOTE — Telephone Encounter (Signed)
Pt daughter is requesting refill of Baclofen for patient. Last refill given in 2020 by [name]. Refill was denied by Gwyndolyn Saxon due to patient not having refilled since 2020.    Per Herb Grays refill is inappropriate due to patients age. Daneil Dan she would need to schedule an apt to discuss.   Advised for the neck and back pain due to arthritis she can use OTC Tylenol Arthritis and Biofreeze and to reach out if patient is in an acute episode of pain. AS, CMA

## 2020-05-28 NOTE — Telephone Encounter (Signed)
Patient needs a refill on Baclofen and uses [facility], thanks

## 2020-06-02 ENCOUNTER — Other Ambulatory Visit: Payer: Self-pay | Admitting: Physician Assistant

## 2020-06-02 DIAGNOSIS — Z760 Encounter for issue of repeat prescription: Secondary | ICD-10-CM

## 2020-06-04 ENCOUNTER — Encounter: Payer: Self-pay | Admitting: Internal Medicine

## 2020-06-04 ENCOUNTER — Other Ambulatory Visit: Payer: Self-pay

## 2020-06-04 ENCOUNTER — Ambulatory Visit: Payer: Medicare HMO | Admitting: Internal Medicine

## 2020-06-04 VITALS — BP 112/70 | HR 53 | Temp 98.0°F | Ht 64.0 in | Wt 151.0 lb

## 2020-06-04 DIAGNOSIS — J329 Chronic sinusitis, unspecified: Secondary | ICD-10-CM | POA: Diagnosis not present

## 2020-06-04 DIAGNOSIS — J40 Bronchitis, not specified as acute or chronic: Secondary | ICD-10-CM | POA: Diagnosis not present

## 2020-06-04 DIAGNOSIS — R062 Wheezing: Secondary | ICD-10-CM

## 2020-06-04 DIAGNOSIS — R918 Other nonspecific abnormal finding of lung field: Secondary | ICD-10-CM

## 2020-06-04 DIAGNOSIS — R1310 Dysphagia, unspecified: Secondary | ICD-10-CM | POA: Diagnosis not present

## 2020-06-04 NOTE — Patient Instructions (Addendum)
ICD-10-CM   1. Chronic sinusitis with recurrent bronchitis  J32.9    J40   2. Wheezing  R06.2   3. Ground glass opacity present on imaging of lung  R91.8   4. Dysphagia, unspecified type  R13.10    I think there is some evidence of chronic sinusitis and along with swallowing difficulties that are age-related there is associated lung inflammation  The combination of this is causing chronic cough and sneezing and wheezing with episodes  The abnormal findings in the CT scan of the chest are likely related to dysphagia related inflammation most likely.  If continues without any control there is potential for long-term lung damage.  Currently today you have some wheezing  Plan - To the extent possible avoid any swallowing issues -abnormal swallowing can cause long-term damage to the lungs -For current wheezing: Please take prednisone  30 mg x1 day, then 20 mg x1 day, then 10 mg x1 day, and then 5 mg x1 day and stop - Also start pulmicort 0.25mg  bid nebulizer twice daily  - if needed get order for new neb machine -Overall recommend a very supportive care approach -Continue Flonase scheduled as before  Follow-up - Any problems call us or return sooner -Otherwise face-to-face visit with myself Dr. Chase Arias or nurse practitioner in 3 months

## 2020-06-04 NOTE — Progress Notes (Signed)
OV 03/20/2020  Subjective:  Patient ID: Donna Arias, female , DOB: 1926-07-01 , age 87 y.o. , MRN: [id number] , ADDRESS: [place] [address] PCP Lorrene Reid, PA-C Patient Care Team: Lorrene Reid, PA-C as PCP - General (Physician Assistant)  This Provider for this visit: Treatment Team:  Attending Provider: Brand Males, MD    03/20/2020 -   Chief Complaint  Patient presents with  . Consult    COPD     HPI Donna Arias 87 y.o. -new consultation referred by her primary care nurse practitioner/physician assistant.  Presents with her daughter who gives most of the history.  Patient at baseline is ECOG 4-3.  Is she is mobile between her bed and sofa in the bathroom but only with assist.  She is cognitively intact but physically debilitated because of her age.  She is chronic arthritis.  The daughter tells me that for the last 7 years patient gets recurrent bronchitis 2 times a year in the last 4 to 5 years this is needed antibiotics and prednisone.  Then for a year possibly the year before the pandemic of the year after the onset of the pandemic patient was fine without any respiratory exacerbations but then in the last 3 or 4 months she has had several exacerbations each lasting 6 to 8 weeks and then has recurrence.  They describe cough wheezing and sputum production requiring antibiotics and prednisone.  This is because significant distress to the patient and her daughter and therefore they are here for consultation.  Patient is on Stiolto but she gets exacerbations despite this.  There is a new history of dysphagia and the daughter use the word "asphyxiation" to describe the swallowing difficulties.  This has not been evaluated.  According to the daughter cardiac condition is pretty good.  The daughter is suspicious that the patient might have seasonal allergies.  There is also chronic sinus drainage in the last few months.  Recent chest x-ray just  showed nonspecific changes personally visualized.   CXR -  IMPRESSION: Chronic lung changes without evidence of acute cardiopulmonary disease   Electronically Signed   By: Corrie Mckusick D.O.   On: 01/11/2020 15:46   PFT  No flowsheet data found.    OV 06/04/2020  Subjective:  Patient ID: Donna Arias, female , DOB: 1926-09-16 , age 84 y.o. , MRN: [id number] , ADDRESS: [place] [address] PCP Lorrene Reid, PA-C Patient Care Team: Lorrene Reid, PA-C as PCP - General (Physician Assistant)  This Provider for this visit: Treatment Team:  Attending Provider: Brand Males, MD    06/04/2020 -   Chief Complaint  Patient presents with  . Follow-up    Pt has been doing okay since last visit. Has had problems with allergies. Pt also did go see speech and swallow for evaluation.  There has been some improvements with the cough but pt will occ have a spell.     HPI Donna Arias 87 y.o. -returns with her daughter for follow-up and discussion of test results.  In the interim, she has had dysphagia evaluation and she has age-related dysphagia.  The daughter is concerned this.  They are having to modify the diet but she said dysphagia is not fully avoidable.  The coughing and sneezing do continue.  Stiolto helps and nebulizer helps with albuterol.  The patient lives with the daughter.  The daughter prefers nebulizer treatment overall.  CT scan of  the sinus shows some mild chronic sinus stuff in the CT scan of the chest shows upper lobe groundglass opacities.  They deny any mold or mildew or birds or down pillows or down blankets in the house.  The daughter reports patient is on Animal nutritionist.  And for the last 2 days is some increased wheezing.     CT Chest data HRCT 04/07/20   IMPRESSION: 1. Image quality is degraded by respiratory motion and expiratory phase imaging. Upper and midlung zone predominant centrilobular nodularity with areas of  subpleural ground-glass in the lung bases. In the absence of active smoking, respiratory bronchiolitis interstitial lung disease is excluded. Subacute hypersensitivity pneumonitis could have this appearance. Findings are suggestive of an alternative diagnosis (not UIP) per consensus guidelines: Diagnosis of Idiopathic Pulmonary Fibrosis: An Official ATS/ERS/JRS/ALAT Clinical Practice Guideline. Annetta South, Iss 5, [id number], Sep 25 2016. 2. Scattered pulmonary nodules measure 4 mm or less in size. These could be followed in 1 year if the patient is at increased risk for bronchogenic carcinoma, and at the discretion of the referring provider, given the patient's age. 3. Aortic atherosclerosis (ICD10-I70.0). Coronary artery calcification.   Electronically Signed   By: Lorin Picket M.D.   On: 04/07/2020 15:34   No results found.  CT SINUS March 2022  IMPRESSION: 1. Mild ethmoid air cell and inferior maxillary sinus mucosal thickening with frothy secretions in the right sphenoid sinus. No air-fluid levels. 2. Two unerupted teeth in the anterior maxilla.   Electronically Signed   By: Margaretha Sheffield MD   On: 04/08/2020 09:54    has a past medical history of Chronic headaches, COPD (chronic obstructive pulmonary disease) ([place]), Dementia ([place]), GERD (gastroesophageal reflux disease), Heart attack ([place]) (1998), HLD (hyperlipidemia), Hypertension, Skin cancer, Stroke ([place]), and Thyroid disease.   reports that she quit smoking about 27 years ago. Her smoking use included cigarettes. She has never used smokeless tobacco.  Past Surgical History:  Procedure Laterality Date  . BREAST LUMPECTOMY Left 1995  . DILATION AND CURETTAGE OF UTERUS  1975  . GALLBLADDER SURGERY  1993    Allergies  Allergen Reactions  . Codeine Sulfate     REACTION: unspecified  . Picato [Ingenol Mebutate] Hives    Immunization History  Administered Date(s) Administered   . Fluad Quad(high Dose 65+) 11/06/2018, 12/11/2019  . H1N1 02/26/2008  . Influenza Split 12/02/2010, 10/07/2011  . Influenza Whole 11/17/2006, 10/27/2007, 12/01/2009  . Influenza, High Dose Seasonal PF 10/15/2014, 11/10/2015, 10/25/2016, 10/27/2017  . Influenza,inj,Quad PF,6+ Mos 11/10/2012, 09/18/2013  . PFIZER(Purple Top)SARS-COV-2 Vaccination 01/30/2019, 04/01/2019, 05/01/2019  . Pneumococcal Conjugate-13 04/30/2013  . Pneumococcal Polysaccharide-23 01/26/2003  . Td 01/25/1997, 02/26/2008  . Zoster 04/08/2008  . Zoster Recombinat (Shingrix) 11/30/2019, 01/31/2020    Family History  Problem Relation Age of Onset  . Hypertension Mother   . Stroke Mother   . Stroke Sister   . Alzheimer's disease Sister   . Stroke Sister   . Hypertension Sister   . Stroke Sister   . Breast cancer Sister      Current Outpatient Medications:  .  albuterol (PROVENTIL) (2.5 MG/3ML) 0.083% nebulizer solution, Take 3 mLs (2.5 mg total) by nebulization every 6 (six) hours as needed for wheezing or shortness of breath., Disp: 75 mL, Rfl: 6 .  amLODipine (NORVASC) 2.5 MG tablet, TAKE 1 TABLET (2.5 MG TOTAL) BY MOUTH DAILY., Disp: 90 tablet, Rfl: 0 .  aspirin 81 MG tablet, Take 81  mg by mouth daily., Disp: , Rfl:  .  atorvastatin (LIPITOR) 10 MG tablet, TAKE 1 TABLET BY MOUTH DAILY., Disp: 90 tablet, Rfl: 0 .  benzonatate (TESSALON) 100 MG capsule, Take 1 capsule (100 mg total) by mouth 3 (three) times daily as needed for cough., Disp: 30 capsule, Rfl: 0 .  Calcium Carbonate-Vit D-Min (CALCIUM 1200 PO), Take 1 tablet by mouth., Disp: , Rfl:  .  fexofenadine (ALLEGRA) 180 MG tablet, Take 180 mg by mouth daily., Disp: , Rfl:  .  FLUoxetine (PROZAC) 10 MG capsule, TAKE 1 CAPSULE BY MOUTH DAILY., Disp: 90 capsule, Rfl: 0 .  fluticasone (FLONASE) 50 MCG/ACT nasal spray, Place 1 spray into both nostrils 2 (two) times daily., Disp: 16 g, Rfl: 6 .  furosemide (LASIX) 20 MG tablet, TAKE 1 TABLET BY MOUTH DAILY.,  Disp: 90 tablet, Rfl: 0 .  ketoconazole (NIZORAL) 2 % shampoo, USE AS DIRECTED, Disp: 120 mL, Rfl: 1 .  levothyroxine (SYNTHROID) 25 MCG tablet, TAKE 1/2 TABLET BY MOUTH DAILY WITH 50 MCG TO EQUAL 62.5 MCG TOTAL DAILY, Disp: 45 tablet, Rfl: 0 .  levothyroxine (SYNTHROID) 50 MCG tablet, TAKE 1 TABLET BY MOUTH EVERY DAY, Disp: 90 tablet, Rfl: 0 .  potassium chloride SA (KLOR-CON) 20 MEQ tablet, TAKE 1 TABLET BY MOUTH ONCE DAILY., Disp: 90 tablet, Rfl: 0 .  risperiDONE (RISPERDAL) 1 MG tablet, TAKE 1 TABLET BY MOUTH 2 TIMES DAILY., Disp: 120 tablet, Rfl: 0 .  STIOLTO RESPIMAT 2.5-2.5 MCG/ACT AERS, Inhale 2 puffs into the lungs daily., Disp: 4 g, Rfl: 2 .  triamcinolone cream (KENALOG) 0.1 %, Apply 1 application topically 2 (two) times daily. Request jar, Disp: 453 g, Rfl: 0  Current Facility-Administered Medications:  .  ipratropium-albuterol (DUONEB) 0.5-2.5 (3) MG/3ML nebulizer solution 3 mL, 3 mL, Nebulization, Q6H, Danford, Katy D, NP, 3 mL at 12/08/17 0958      Objective:   Vitals:   06/04/20 1352  BP: 112/70  Pulse: (!) 53  Temp: 98 F (36.7 C)  TempSrc: Temporal  SpO2: 98%  Weight: 151 lb (68.5 kg)  Height: 5\' 4"  (1.626 m)    Estimated body mass index is 25.92 kg/m as calculated from the following:   Height as of this encounter: 5\' 4"  (1.626 m).   Weight as of this encounter: 151 lb (68.5 kg).  @WEIGHTCHANGE @  [employer]   06/04/20 1352  Weight: 151 lb (68.5 kg)     Physical Exam  Frail elderly lady sitting in a wheelchair mild age-related edema present.  Skin looks frail and appropriate for age.  Oral cavity is normal.  Has some scattered wheezing present.  Abdomen soft normal heart sounds      Assessment:       ICD-10-CM   1. Chronic sinusitis with recurrent bronchitis  J32.9    J40   2. Wheezing  R06.2   3. Ground glass opacity present on imaging of lung  R91.8   4. Dysphagia, unspecified type  R13.10        Plan:     Patient Instructions      ICD-10-CM   1. Chronic sinusitis with recurrent bronchitis  J32.9    J40   2. Wheezing  R06.2   3. Ground glass opacity present on imaging of lung  R91.8   4. Dysphagia, unspecified type  R13.10    I think there is some evidence of chronic sinusitis and along with swallowing difficulties that are age-related there is associated lung inflammation  The combination of this is causing chronic cough and sneezing and wheezing with episodes  The abnormal findings in the CT scan of the chest are likely related to dysphagia related inflammation most likely.  If continues without any control there is potential for long-term lung damage.  Currently today you have some wheezing  Plan - To the extent possible avoid any swallowing issues -abnormal swallowing can cause long-term damage to the lungs -For current wheezing: Please take prednisone  30 mg x1 day, then 20 mg x1 day, then 10 mg x1 day, and then 5 mg x1 day and stop - Also start pulmicort 0.25mg  bid nebulizer twice daily  - if needed get order for new neb machine -Overall recommend a very supportive care approach -Continue Flonase scheduled as before  Follow-up - Any problems call us or return sooner -Otherwise face-to-face visit with myself Dr. Chase Caller or nurse practitioner in 3 months      SIGNATURE    Dr. Brand Males, M.D., F.C.C.P,  Pulmonary and Critical Care Medicine Staff Physician, [place] Director - Interstitial Lung Disease  Program  Pulmonary [place] at [place], [place], [address]  Pager: [phone], If no answer or between  15:00h - 7:00h: call [phone] Telephone: [phone]  2:34 PM 06/04/2020

## 2020-06-05 ENCOUNTER — Telehealth: Payer: Self-pay | Admitting: Primary Care

## 2020-06-05 ENCOUNTER — Telehealth: Payer: Self-pay | Admitting: Internal Medicine

## 2020-06-05 MED ORDER — BUDESONIDE 0.25 MG/2ML IN SUSP: 0.2500 mg | Freq: Two times a day (BID) | RESPIRATORY_TRACT | 11 refills | Status: DC

## 2020-06-05 MED ORDER — PREDNISONE 10 MG (21) PO TBPK: ORAL_TABLET | Freq: Every day | ORAL | 0 refills | Status: DC

## 2020-06-05 NOTE — Telephone Encounter (Signed)
Patient daughter [name] [facility]) called asking if new nebulizer medication had been sent to pharmacy.  Pulmicort 0.25 order was not placed at [facility], but placed order today.  [facility] requested [facility] [employer]. [name] stated Dr. Chase Caller discussed starting Pulmicort, but she was not sure if Patient needs to continue Stiolto inhaler.  I advised Stiolto is a maintenance inhaler and Pulmicort neb is also a  maintenance medication.  I did tell [name] I would clarify with Dr.Ramaswamy  If Patient should continue Stiolto.  Message routed to Dr. Chase Caller    Dr. Lavell Anchors 06/04/20 OV-  Plan - To the extent possible avoid any swallowing issues -abnormal swallowing can cause long-term damage to the lungs -For current wheezing: Please take prednisone  30 mg x1 day, then 20 mg x1 day, then 10 mg x1 day, and then 5 mg x1 day and stop - Also start pulmicort 0.25mg  bid nebulizer twice daily             - if needed get order for new neb machine -Overall recommend a very supportive care approach -Continue Flonase scheduled as before  Follow-up - Any problems call us or return sooner -Otherwise face-to-face visit with myself Dr. Chase Caller or nurse practitioner in 3 months

## 2020-06-05 NOTE — Telephone Encounter (Signed)
I called and spoke with patient daughter who is on [place]. I checked and it looks like the prednisone was not sent in so I went ahead and sent that in to the preferred pharmacy. She also asked the other question she called in earlier about still taking stiolto. Looks like MR has not responded but in looking in the notes, MR did not mention stopping Stiolto but using neb meds in addition to help with current wheezing. I informed daughter if when he answers the message and states anything different, we will reach out. She verbalized understanding, nothing further needed at this time.

## 2020-06-05 NOTE — Telephone Encounter (Signed)
Spoke with [name] (per DPR) and clarified Stiolto and Pulmicort neb instructions. [name] stated understanding. Nothing further needed at this time.

## 2020-06-05 NOTE — Telephone Encounter (Signed)
Sorry did not make it clear - yes continue stiolto + pulmicort 0.25mg  bid as neb with new neb machine (neb via DME, Medicare Part B)

## 2020-06-09 ENCOUNTER — Other Ambulatory Visit: Payer: Self-pay | Admitting: Physician Assistant

## 2020-06-11 ENCOUNTER — Telehealth: Payer: Self-pay | Admitting: Physician Assistant

## 2020-06-11 NOTE — Telephone Encounter (Signed)
Per Herb Grays patient will need an appointment for evaluation and treatment. Per Herb Grays we can add to schedule for today or tomorrow if no acute apts available. AS, CMA

## 2020-06-11 NOTE — Telephone Encounter (Signed)
Patient scheduled.

## 2020-06-11 NOTE — Telephone Encounter (Signed)
Patient has conjunctivitis in left eye- is asking for a refill of the previous Rx for the Dx prescribed at last visit  Please send Rx to [facility]. Thank you

## 2020-06-12 ENCOUNTER — Other Ambulatory Visit: Payer: Self-pay

## 2020-06-12 ENCOUNTER — Encounter: Payer: Self-pay | Admitting: Physician Assistant

## 2020-06-12 ENCOUNTER — Ambulatory Visit (INDEPENDENT_AMBULATORY_CARE_PROVIDER_SITE_OTHER): Payer: Medicare HMO | Admitting: Physician Assistant

## 2020-06-12 VITALS — BP 122/79 | HR 62 | Temp 96.5°F | Ht 64.0 in | Wt 148.1 lb

## 2020-06-12 DIAGNOSIS — H1032 Unspecified acute conjunctivitis, left eye: Secondary | ICD-10-CM | POA: Diagnosis not present

## 2020-06-12 MED ORDER — ERYTHROMYCIN 5 MG/GM OP OINT: 1.0000 "application " | TOPICAL_OINTMENT | Freq: Every day | OPHTHALMIC | 2 refills | Status: DC

## 2020-06-12 NOTE — Progress Notes (Signed)
Acute Office Visit  Subjective:    Patient ID: Donna Arias, female    DOB: [birth year], 87 y.o.   MRN: [id number]  Chief Complaint  Patient presents with  . left eye    HPI Patient is in today for c/o left eye redness x couple of days. Patient states left is itchy and watery. Patient's daughter reports it is matted and crusty in the mornings. Denies fever, injury or trauma. Has applied ointment that was prescribed in the past which has helped some. Patient states was told by pulmonologist sinuses are congested. Have tried Neti pot but patient unable to tolerate.    Past Medical History:  Diagnosis Date  . Chronic headaches   . COPD (chronic obstructive pulmonary disease) ([place])   . Dementia ([place])   . GERD (gastroesophageal reflux disease)   . Heart attack ([place]) 1998  . HLD (hyperlipidemia)   . Hypertension   . Skin cancer    squamous cell of left face  . Stroke ([place])   . Thyroid disease     Past Surgical History:  Procedure Laterality Date  . BREAST LUMPECTOMY Left 1995  . DILATION AND CURETTAGE OF UTERUS  1975  . GALLBLADDER SURGERY  1993    Family History  Problem Relation Age of Onset  . Hypertension Mother   . Stroke Mother   . Stroke Sister   . Alzheimer's disease Sister   . Stroke Sister   . Hypertension Sister   . Stroke Sister   . Breast cancer Sister     Social History   Socioeconomic History  . Marital status: Widowed    Spouse name: Not on file  . Number of children: 1  . Years of education: Not on file  . Highest education level: Not on file  Occupational History  . Occupation: RETIRED  Tobacco Use  . Smoking status: Former Smoker    Types: Cigarettes    Quit date: 1995    Years since quitting: 27.4  . Smokeless tobacco: Never Used  Vaping Use  . Vaping Use: Never used  Substance and Sexual Activity  . Alcohol use: No  . Drug use: No  . Sexual activity: Never    Birth control/protection: None  Other Topics Concern  . Not on file   Social History Narrative   CB x 1   Lives at home with her daughter (since 02/05/2013)   Right handed   Regular exercise - NO   Social Determinants of Health   Financial Resource Strain: Not on file  Food Insecurity: Not on file  Transportation Needs: Not on file  Physical Activity: Not on file  Stress: Not on file  Social Connections: Not on file  Intimate Partner Violence: Not on file    Outpatient Medications Prior to Visit  Medication Sig Dispense Refill  . albuterol (PROVENTIL) (2.5 MG/3ML) 0.083% nebulizer solution Take 3 mLs (2.5 mg total) by nebulization every 6 (six) hours as needed for wheezing or shortness of breath. 75 mL 6  . amLODipine (NORVASC) 2.5 MG tablet TAKE 1 TABLET (2.5 MG TOTAL) BY MOUTH DAILY. 90 tablet 0  . aspirin 81 MG tablet Take 81 mg by mouth daily.    [name] atorvastatin (LIPITOR) 10 MG tablet TAKE 1 TABLET BY MOUTH DAILY. 90 tablet 0  . benzonatate (TESSALON) 100 MG capsule Take 1 capsule (100 mg total) by mouth 3 (three) times daily as needed for cough. 30 capsule 0  . budesonide (PULMICORT) 0.25 MG/2ML nebulizer solution  Take 2 mLs (0.25 mg total) by nebulization in the morning and at bedtime. 120 mL 11  . Calcium Carbonate-Vit D-Min (CALCIUM 1200 PO) Take 1 tablet by mouth.    . fexofenadine (ALLEGRA) 180 MG tablet Take 180 mg by mouth daily.    [name] FLUoxetine (PROZAC) 10 MG capsule TAKE 1 CAPSULE BY MOUTH DAILY. 90 capsule 0  . fluticasone (FLONASE) 50 MCG/ACT nasal spray Place 1 spray into both nostrils 2 (two) times daily. 16 g 6  . furosemide (LASIX) 20 MG tablet TAKE 1 TABLET BY MOUTH DAILY. 90 tablet 0  . ketoconazole (NIZORAL) 2 % shampoo USE AS DIRECTED 120 mL 1  . levothyroxine (SYNTHROID) 25 MCG tablet TAKE 1/2 TABLET BY MOUTH DAILY WITH 50 MCG TO EQUAL 62.5 MCG TOTAL DAILY 45 tablet 0  . levothyroxine (SYNTHROID) 50 MCG tablet TAKE 1 TABLET BY MOUTH EVERY DAY 90 tablet 0  . potassium chloride SA (KLOR-CON) 20 MEQ tablet TAKE 1 TABLET BY MOUTH  ONCE DAILY. 90 tablet 0  . risperiDONE (RISPERDAL) 1 MG tablet TAKE 1 TABLET BY MOUTH 2 TIMES DAILY. 120 tablet 0  . STIOLTO RESPIMAT 2.5-2.5 MCG/ACT AERS Inhale 2 puffs into the lungs daily. 4 g 2  . triamcinolone cream (KENALOG) 0.1 % Apply 1 application topically 2 (two) times daily. Request jar 453 g 0  . predniSONE (STERAPRED UNI-PAK 21 TAB) 10 MG (21) TBPK tablet Take by mouth daily. Take 50m for 1 day, then 217mfor 1 day, then 103mor 1 day then 5mg59mr one day then stop. 7 tablet 0   Facility-Administered Medications Prior to Visit  Medication Dose Route Frequency Provider Last Rate Last Admin  . ipratropium-albuterol (DUONEB) 0.5-2.5 (3) MG/3ML nebulizer solution 3 mL  3 mL Nebulization Q6H Danford, Katy D, NP   3 mL at 12/08/17 0958    Allergies  Allergen Reactions  . Codeine Sulfate     REACTION: unspecified  . Picato [Ingenol Mebutate] Hives    Review of Systems Review of Systems:  A fourteen system review of systems was performed and found to be positive as per HPI.    Objective:    Physical Exam General:  Well Developed, well nourished, in no acute distress  Neuro:  Alert and oriented,  extra-ocular muscles intact  HEENT:  Normocephalic, atraumatic, no tenderness to frontal sinus, redness of conjunctiva of left eye, erythema of upper and lower eyelid of left eye, some mucopurulent discharge noted, normal nasal mucosa, neck supple, no adenopathy Skin:  no gross rash, warm, pink.  Respiratory: Not using accessory muscles, speaking in full sentences- unlabored. Vascular:  Ext warm, no cyanosis apprec.; cap RF less 2 sec. Psych:  No HI/SI, judgement and insight good, Euthymic mood. Full Affect.   BP 122/79   Pulse 62   Temp (!) 96.5 F (35.8 C)   Ht _0  (1.626 m)   Wt 148 lb 1.6 oz (67.2 kg)   SpO2 97%   BMI 25.42 kg/m  Wt Readings from Last 3 Encounters:  06/12/20 148 lb 1.6 oz (67.2 kg)  06/04/20 151 lb (68.5 kg)  05/27/20 155 lb (70.3 kg)    Health  Maintenance Due  Topic Date Due  . TETANUS/TDAP  02/25/2018  . COVID-19 Vaccine (4 - Booster for Pfizer series) 07/31/2019    There are no preventive care reminders to display for this patient.   Lab Results  Component Value Date   TSH 2.790 08/07/2019   Lab Results  Component Value  Date   WBC 6.7 05/23/2020   HGB 14.1 05/23/2020   HCT 41.9 05/23/2020   MCV 93 05/23/2020   PLT 190 05/23/2020   Lab Results  Component Value Date   NA 145 (H) 05/23/2020   K 4.2 05/23/2020   CO2 22 05/23/2020   GLUCOSE 95 05/23/2020   BUN 20 05/23/2020   CREATININE 1.21 (H) 05/23/2020   BILITOT 0.5 05/23/2020   ALKPHOS 86 05/23/2020   AST 16 05/23/2020   ALT 13 05/23/2020   PROT 6.5 05/23/2020   ALBUMIN 3.9 05/23/2020   CALCIUM 10.0 05/23/2020   EGFR 42 (L) 05/23/2020   GFR 45.15 (L) 08/01/2015   Lab Results  Component Value Date   CHOL 156 05/23/2020   Lab Results  Component Value Date   HDL 61 05/23/2020   Lab Results  Component Value Date   LDLCALC 80 05/23/2020   Lab Results  Component Value Date   TRIG 76 05/23/2020   Lab Results  Component Value Date   CHOLHDL 2.6 05/23/2020   Lab Results  Component Value Date   HGBA1C 5.6 08/07/2019       Assessment & Plan:   Problem List Items Addressed This Visit   None   Visit Diagnoses    Acute conjunctivitis of left eye, unspecified acute conjunctivitis type    -  Primary   Relevant Medications   erythromycin ophthalmic ointment     Acute conjunctivitis of left eye, unspecified acute conjunctivitis type: -Discussed adequate eye hygiene and will start topical antibiotic therapy given patient has signs and symptoms suggestive of bacterial conjunctivitis.  -Follow up if symptoms fail to improve or worsen.   Advised can trial Nasacort and/or saline spray for congestion.   Meds ordered this encounter  Medications  . erythromycin ophthalmic ointment    Sig: Place 1 application into the left eye at bedtime.     Dispense:  3.5 g    Refill:  2    Order Specific Question:   Supervising Provider    Answer:   Beatrice Lecher D [[id number]]    Note:  This note was prepared with assistance of Dragon voice recognition software. Occasional wrong-word or sound-a-like substitutions may have occurred due to the inherent limitations of voice recognition software.   Lorrene Reid, PA-C

## 2020-06-12 NOTE — Patient Instructions (Signed)
Switch Flonase to Nasocort, you can alternate saline spray with nasocort   Bacterial Conjunctivitis, Adult Bacterial conjunctivitis is an infection of the clear membrane that covers the white part of your eye and the inner surface of your eyelid (conjunctiva). When the blood vessels in your conjunctiva become inflamed, your eye becomes red or pink, and it will probably feel itchy. Bacterial conjunctivitis spreads very easily from person to person (is contagious). It also spreads easily from one eye to the other eye. What are the causes? This condition is caused by bacteria. You may get the infection if you come into close contact with:  A person who is infected with the bacteria.  Items that are contaminated with the bacteria, such as a face towel, contact lens solution, or eye makeup. What increases the risk? You are more likely to develop this condition if you:  Are exposed to other people who have the infection.  Wear contact lenses.  Have a sinus infection.  Have had a recent eye injury or surgery.  Have a weak body defense system (immune system).  Have a medical condition that causes dry eyes. What are the signs or symptoms? Symptoms of this condition include:  Thick, yellowish discharge from the eye. This may turn into a crust on the eyelid overnight and cause your eyelids to stick together.  Tearing or watery eyes.  Itchy eyes.  Burning feeling in your eyes.  Eye redness.  Swollen eyelids.  Blurred vision.   How is this diagnosed? This condition is diagnosed based on your symptoms and medical history. Your health care provider may also take a sample of discharge from your eye to find the cause of your infection. This is rarely done. How is this treated? This condition may be treated with:  Antibiotic eye drops or ointment to clear the infection more quickly and prevent the spread of infection to others.  Oral antibiotic medicines to treat infections that do not  respond to drops or ointments or that last longer than 10 days.  Cool, wet cloths (cool compresses) placed on the eyes.  Artificial tears applied 2-6 times a day.   Follow these instructions at home: Medicines  Take or apply your antibiotic medicine as told by your health care provider. Do not stop taking or applying the antibiotic even if you start to feel better.  Take or apply over-the-counter and prescription medicines only as told by your health care provider.  Be very careful to avoid touching the edge of your eyelid with the eye-drop bottle or the ointment tube when you apply medicines to the affected eye. This will keep you from spreading the infection to your other eye or to other people. Managing discomfort  Gently wipe away any drainage from your eye with a warm, wet washcloth or a cotton ball.  Apply a clean, cool compress to your eye for 10-20 minutes, 3-4 times a day. General instructions  Do not wear contact lenses until the inflammation is gone and your health care provider says it is safe to wear them again. Ask your health care provider how to sterilize or replace your contact lenses before you use them again. Wear glasses until you can resume wearing contact lenses.  Avoid wearing eye makeup until the inflammation is gone. Throw away any old eye cosmetics that may be contaminated.  Change or wash your pillowcase every day.  Do not share towels or washcloths. This may spread the infection.  Wash your hands often with soap and water.  Use paper towels to dry your hands.  Avoid touching or rubbing your eyes.  Do not drive or use heavy machinery if your vision is blurred. Contact a health care provider if:  You have a fever.  Your symptoms do not get better after 10 days. Get help right away if you have:  A fever and your symptoms suddenly get worse.  Severe pain when you move your eye.  Facial pain, redness, or swelling.  Sudden loss of  vision. Summary  Bacterial conjunctivitis is an infection of the clear membrane that covers the white part of your eye and the inner surface of your eyelid (conjunctiva).  Bacterial conjunctivitis spreads very easily from person to person (is contagious).  Wash your hands often with soap and water. Use paper towels to dry your hands.  Take or apply your antibiotic medicine as told by your health care provider. Do not stop taking or applying the antibiotic even if you start to feel better.  Contact a health care provider if you have a fever or your symptoms do not get better after 10 days. This information is not intended to replace advice given to you by your health care provider. Make sure you discuss any questions you have with your health care provider. Document Revised: 05/02/2018 Document Reviewed: 08/17/2017 Elsevier Patient Education  [place].

## 2020-06-16 ENCOUNTER — Other Ambulatory Visit: Payer: Self-pay | Admitting: Physician Assistant

## 2020-07-01 ENCOUNTER — Encounter (INDEPENDENT_AMBULATORY_CARE_PROVIDER_SITE_OTHER): Payer: Self-pay | Admitting: Ophthalmology

## 2020-07-01 ENCOUNTER — Ambulatory Visit (INDEPENDENT_AMBULATORY_CARE_PROVIDER_SITE_OTHER): Payer: Medicare HMO | Admitting: Ophthalmology

## 2020-07-01 ENCOUNTER — Other Ambulatory Visit: Payer: Self-pay

## 2020-07-01 DIAGNOSIS — H353124 Nonexudative age-related macular degeneration, left eye, advanced atrophic with subfoveal involvement: Secondary | ICD-10-CM

## 2020-07-01 DIAGNOSIS — H353222 Exudative age-related macular degeneration, left eye, with inactive choroidal neovascularization: Secondary | ICD-10-CM

## 2020-07-01 DIAGNOSIS — H353211 Exudative age-related macular degeneration, right eye, with active choroidal neovascularization: Secondary | ICD-10-CM | POA: Diagnosis not present

## 2020-07-01 MED ORDER — BEVACIZUMAB 2.5 MG/0.1ML IZ SOSY
2.5000 mg | PREFILLED_SYRINGE | INTRAVITREAL | Status: AC | PRN
  Administered 2020-07-01: 2.5 mg via INTRAVITREAL

## 2020-07-01 NOTE — Assessment & Plan Note (Signed)
No change by OCT OS

## 2020-07-01 NOTE — Assessment & Plan Note (Signed)
No intraretinal fluid or subretinal fluid remains, as compared to November 2021

## 2020-07-01 NOTE — Progress Notes (Signed)
07/01/2020     CHIEF COMPLAINT Patient presents for Retina Follow Up (7 week fu OD and OCT / Avastin OD/Pt daughter states, "She keeps getting bacterial infections in her OS and this time it also moved to OD. She is using [employer] but as soon as we stop using it the infection comes right back. She just had another one last week."/Reports using Erythromycin ung Qday OU)   HISTORY OF PRESENT ILLNESS: Donna Arias is a 87 y.o. female who presents to the clinic today for:   HPI    Retina Follow Up    Diagnosis: Wet AMD   Laterality: right eye   Onset: 7 weeks ago   Severity: mild   Duration: 7 weeks   Course: gradually worsening   Comments: 7 week fu OD and OCT / Avastin OD Pt daughter states, "She keeps getting bacterial infections in her OS and this time it also moved to OD. She is using [employer] but as soon as we stop using it the infection comes right back. She just had another one last week." Reports using Erythromycin ung Qday OU          Comments    No change in vision in the right eye per patient       Last edited by Hurman Horn, MD on 07/01/2020 11:13 AM. (History)      Referring physician: Lorrene Reid, PA-C [place]. [place],  [place] [id number]  HISTORICAL INFORMATION:   Selected notes from the MEDICAL RECORD NUMBER    Lab Results  Component Value Date   HGBA1C 5.6 08/07/2019     CURRENT MEDICATIONS: Current Outpatient Medications (Ophthalmic Drugs)  Medication Sig  . erythromycin ophthalmic ointment Place 1 application into the left eye at bedtime.   No current facility-administered medications for this visit. (Ophthalmic Drugs)   Current Outpatient Medications (Other)  Medication Sig  . albuterol (PROVENTIL) (2.5 MG/3ML) 0.083% nebulizer solution Take 3 mLs (2.5 mg total) by nebulization every 6 (six) hours as needed for wheezing or shortness of breath.  [name] amLODipine (NORVASC) 2.5 MG tablet TAKE 1 TABLET (2.5 MG TOTAL) BY  MOUTH DAILY.  [name] aspirin 81 MG tablet Take 81 mg by mouth daily.  [name] atorvastatin (LIPITOR) 10 MG tablet TAKE 1 TABLET BY MOUTH DAILY.  . benzonatate (TESSALON) 100 MG capsule Take 1 capsule (100 mg total) by mouth 3 (three) times daily as needed for cough.  . budesonide (PULMICORT) 0.25 MG/2ML nebulizer solution Take 2 mLs (0.25 mg total) by nebulization in the morning and at bedtime.  . Calcium Carbonate-Vit D-Min (CALCIUM 1200 PO) Take 1 tablet by mouth.  . fexofenadine (ALLEGRA) 180 MG tablet Take 180 mg by mouth daily.  [name] FLUoxetine (PROZAC) 10 MG capsule TAKE 1 CAPSULE BY MOUTH DAILY.  . fluticasone (FLONASE) 50 MCG/ACT nasal spray Place 1 spray into both nostrils 2 (two) times daily.  . furosemide (LASIX) 20 MG tablet TAKE 1 TABLET BY MOUTH DAILY.  [name] ketoconazole (NIZORAL) 2 % shampoo USE AS DIRECTED  . levothyroxine (SYNTHROID) 25 MCG tablet TAKE 1/2 TABLET BY MOUTH DAILY WITH 50 MCG TO EQUAL 62.5 MCG TOTAL DAILY  . levothyroxine (SYNTHROID) 50 MCG tablet TAKE 1 TABLET BY MOUTH EVERY DAY  . potassium chloride SA (KLOR-CON) 20 MEQ tablet TAKE 1 TABLET BY MOUTH ONCE DAILY.  [name] risperiDONE (RISPERDAL) 1 MG tablet TAKE 1 TABLET BY MOUTH 2 TIMES DAILY.  [name] STIOLTO RESPIMAT 2.5-2.5 MCG/ACT AERS  Inhale 2 puffs into the lungs daily.  [name] triamcinolone cream (KENALOG) 0.1 % Apply 1 application topically 2 (two) times daily. Request jar   Current Facility-Administered Medications (Other)  Medication Route  . ipratropium-albuterol (DUONEB) 0.5-2.5 (3) MG/3ML nebulizer solution 3 mL Nebulization      REVIEW OF SYSTEMS:    ALLERGIES Allergies  Allergen Reactions  . Codeine Sulfate     REACTION: unspecified  . Picato [Ingenol Mebutate] Hives    PAST MEDICAL HISTORY Past Medical History:  Diagnosis Date  . Chronic headaches   . COPD (chronic obstructive pulmonary disease) ([place])   . Dementia ([place])   . GERD (gastroesophageal reflux disease)   . Heart attack ([place]) 1998  . HLD  (hyperlipidemia)   . Hypertension   . Skin cancer    squamous cell of left face  . Stroke ([place])   . Thyroid disease    Past Surgical History:  Procedure Laterality Date  . BREAST LUMPECTOMY Left 1995  . DILATION AND CURETTAGE OF UTERUS  1975  . GALLBLADDER SURGERY  1993    FAMILY HISTORY Family History  Problem Relation Age of Onset  . Hypertension Mother   . Stroke Mother   . Stroke Sister   . Alzheimer's disease Sister   . Stroke Sister   . Hypertension Sister   . Stroke Sister   . Breast cancer Sister     SOCIAL HISTORY Social History   Tobacco Use  . Smoking status: Former Smoker    Types: Cigarettes    Quit date: 1995    Years since quitting: 27.4  . Smokeless tobacco: Never Used  Vaping Use  . Vaping Use: Never used  Substance Use Topics  . Alcohol use: No  . Drug use: No         OPHTHALMIC EXAM: Base Eye Exam    Visual Acuity (ETDRS)      Right Left   Dist cc 20/70 -1 20/400   Dist ph cc NI NI   Correction: Glasses       Tonometry (Tonopen, 10:11 AM)      Right Left   Pressure 10 10       Pupils      Pupils Dark Light Shape React APD   Right PERRL 4 3 Round Brisk None   Left PERRL 4 3 Round Brisk None       Visual Fields (Counting fingers)      Left Right    Full Full       Extraocular Movement      Right Left    Full Full       Neuro/Psych    Oriented x3: Yes   Mood/Affect: Normal       Dilation    Right eye: 1.0% Mydriacyl, 2.5% Phenylephrine @ 10:11 AM        Slit Lamp and Fundus Exam    External Exam      Right Left   External Normal Normal       Slit Lamp Exam      Right Left   Lids/Lashes Normal Normal   Conjunctiva/Sclera White and quiet White and quiet   Cornea Clear Clear   Anterior Chamber Deep and quiet Deep and quiet   Iris Round and reactive Round and reactive   Lens Centered posterior chamber intraocular lens Centered posterior chamber intraocular lens   Anterior Vitreous Normal Normal        Fundus Exam      Right Left  Posterior Vitreous Posterior vitreous detachment    Disc Peripapillary atrophy    C/D Ratio 0.65 0.6   Macula Retinal pigment epithelial mottling, no macular thickening, Retinal atrophy, Early age related macular degeneration, no serous retinal detachment visible clinically, minor epiretinal membrane not distorting    Vessels Normal    Periphery Normal           IMAGING AND PROCEDURES  Imaging and Procedures for 07/01/20  OCT, Retina - OU - Both Eyes       Right Eye Quality was good. Scan locations included subfoveal. Central Foveal Thickness: 278. Progression has improved. Findings include abnormal foveal contour.   Left Eye Quality was good. Scan locations included subfoveal. Central Foveal Thickness: 130. Progression has been stable.   Notes Less subretinal fluid, now resolved and perifoveal CME completely resolved as compared to findings November 2021. Currently at 6-week interval. Repeat injection Avastin today and examination again in 8 weeks to maintain acuity in this best functioning eye  OS with macular atrophy from previous retinal vascular disease.  Stable overall       Intravitreal Injection, Pharmacologic Agent - OD - Right Eye       Time Out 07/01/2020. 11:16 AM. Confirmed correct patient, procedure, site, and patient consented.   Anesthesia Topical anesthesia was used. Anesthetic medications included [place] 3.5%.   Procedure Preparation included Ofloxacin , Tobramycin 0.3%, 10% betadine to eyelids, 5% betadine to ocular surface. A 30 gauge needle was used.   Injection:  2.5 mg Bevacizumab (AVASTIN) 2.5mg /0.59mL SOSY   NDC: 62229-798-92, Lot: [id number]   Route: Intravitreal, Site: Right Eye  Post-op Post injection exam found visual acuity of at least counting fingers. The patient tolerated the procedure well. There were no complications. The patient received written and verbal post procedure care education. Post injection  medications were not given.                 ASSESSMENT/PLAN:  Exudative age-related macular degeneration of left eye with inactive choroidal neovascularization (HCC) No change by OCT OS  Exudative age-related macular degeneration of right eye with active choroidal neovascularization (HCC) No intraretinal fluid or subretinal fluid remains, as compared to November 2021  Advanced nonexudative age-related macular degeneration of left eye with subfoveal involvement Accounts for acuity no change      ICD-10-CM   1. Exudative age-related macular degeneration of right eye with active choroidal neovascularization (HCC)  H35.3211 OCT, Retina - OU - Both Eyes    Intravitreal Injection, Pharmacologic Agent - OD - Right Eye    bevacizumab (AVASTIN) SOSY 2.5 mg  2. Exudative age-related macular degeneration of left eye with inactive choroidal neovascularization ([place])  H35.3222   3. Advanced nonexudative age-related macular degeneration of left eye with subfoveal involvement  H35.3124     1.  OD, resolved subfoveal fluid and intraretinal CME as compared to November 2021 currently at 7-week follow-up interval.  Preserved acuity.  Repeat injection today and maintain this interval examination and this monocular patient.  2.  3.  Ophthalmic Meds Ordered this visit:  Meds ordered this encounter  Medications  . bevacizumab (AVASTIN) SOSY 2.5 mg       Return in about 7 weeks (around 08/19/2020) for dilate, OD, AVASTIN OCT.  There are no Patient Instructions on file for this visit.   Explained the diagnoses, plan, and follow up with the patient and they expressed understanding.  Patient expressed understanding of the importance of proper follow up care.   Clent Demark Jahnay Lantier  M.D. Diseases & Surgery of the Retina and [place] 07/01/20     Abbreviations: M myopia (nearsighted); A astigmatism; H hyperopia (farsighted); P presbyopia; Mrx spectacle prescription;   CTL contact lenses; OD right eye; OS left eye; OU both eyes  XT exotropia; ET esotropia; PEK punctate epithelial keratitis; PEE punctate epithelial erosions; DES dry eye syndrome; MGD meibomian gland dysfunction; ATs artificial tears; PFAT's preservative free artificial tears; [place] nuclear sclerotic cataract; PSC posterior subcapsular cataract; ERM epi-retinal membrane; PVD posterior vitreous detachment; RD retinal detachment; DM diabetes mellitus; DR diabetic retinopathy; NPDR non-proliferative diabetic retinopathy; PDR proliferative diabetic retinopathy; CSME clinically significant macular edema; DME diabetic macular edema; dbh dot blot hemorrhages; CWS cotton wool spot; POAG primary open angle glaucoma; C/D cup-to-disc ratio; HVF humphrey visual field; GVF goldmann visual field; OCT optical coherence tomography; IOP intraocular pressure; BRVO Branch retinal vein occlusion; CRVO central retinal vein occlusion; CRAO central retinal artery occlusion; BRAO branch retinal artery occlusion; RT retinal tear; SB scleral buckle; PPV pars plana vitrectomy; VH Vitreous hemorrhage; PRP panretinal laser photocoagulation; IVK intravitreal kenalog; VMT vitreomacular traction; MH Macular hole;  NVD neovascularization of the disc; NVE neovascularization elsewhere; AREDS age related eye disease study; ARMD age related macular degeneration; POAG primary open angle glaucoma; EBMD epithelial/anterior basement membrane dystrophy; ACIOL anterior chamber intraocular lens; IOL intraocular lens; PCIOL posterior chamber intraocular lens; Phaco/IOL phacoemulsification with intraocular lens placement; [place] photorefractive keratectomy; LASIK laser assisted in situ keratomileusis; HTN hypertension; DM diabetes mellitus; COPD chronic obstructive pulmonary disease

## 2020-07-01 NOTE — Assessment & Plan Note (Signed)
Accounts for acuity no change

## 2020-07-05 DIAGNOSIS — H10013 Acute follicular conjunctivitis, bilateral: Secondary | ICD-10-CM | POA: Diagnosis not present

## 2020-07-08 ENCOUNTER — Other Ambulatory Visit: Payer: Self-pay | Admitting: Physician Assistant

## 2020-07-08 DIAGNOSIS — E039 Hypothyroidism, unspecified: Secondary | ICD-10-CM

## 2020-07-12 DIAGNOSIS — H10023 Other mucopurulent conjunctivitis, bilateral: Secondary | ICD-10-CM | POA: Diagnosis not present

## 2020-07-14 ENCOUNTER — Other Ambulatory Visit: Payer: Self-pay | Admitting: Physician Assistant

## 2020-07-14 DIAGNOSIS — J449 Chronic obstructive pulmonary disease, unspecified: Secondary | ICD-10-CM

## 2020-07-23 ENCOUNTER — Other Ambulatory Visit: Payer: Self-pay | Admitting: Physician Assistant

## 2020-07-23 DIAGNOSIS — Z760 Encounter for issue of repeat prescription: Secondary | ICD-10-CM

## 2020-08-05 ENCOUNTER — Telehealth: Payer: Self-pay | Admitting: Physician Assistant

## 2020-08-05 DIAGNOSIS — H1032 Unspecified acute conjunctivitis, left eye: Secondary | ICD-10-CM

## 2020-08-05 MED ORDER — ERYTHROMYCIN 5 MG/GM OP OINT
1.0000 | TOPICAL_OINTMENT | Freq: Every day | OPHTHALMIC | 2 refills | Status: AC
Start: 2020-08-05 — End: ?

## 2020-08-05 NOTE — Telephone Encounter (Signed)
Patient's daughter called in stating patient has a reoccurring eye problem. It is red, some pus but not much. Please advise, thanks.

## 2020-08-05 NOTE — Telephone Encounter (Signed)
Per Herb Grays sending azithromycin cream to pharmacy. Please advise Donna Arias if symptoms persist or worsen to contact opthalmology.

## 2020-08-05 NOTE — Addendum Note (Signed)
Addended by: Mickel Crow on: 08/05/2020 01:55 PM   Modules accepted: Orders

## 2020-08-18 ENCOUNTER — Other Ambulatory Visit: Payer: Self-pay | Admitting: Physician Assistant

## 2020-08-18 DIAGNOSIS — Z760 Encounter for issue of repeat prescription: Secondary | ICD-10-CM

## 2020-08-19 ENCOUNTER — Other Ambulatory Visit: Payer: Self-pay

## 2020-08-19 ENCOUNTER — Ambulatory Visit (INDEPENDENT_AMBULATORY_CARE_PROVIDER_SITE_OTHER): Payer: Medicare HMO | Admitting: Ophthalmology

## 2020-08-19 ENCOUNTER — Encounter (INDEPENDENT_AMBULATORY_CARE_PROVIDER_SITE_OTHER): Payer: Self-pay | Admitting: Ophthalmology

## 2020-08-19 DIAGNOSIS — H353222 Exudative age-related macular degeneration, left eye, with inactive choroidal neovascularization: Secondary | ICD-10-CM

## 2020-08-19 DIAGNOSIS — H43811 Vitreous degeneration, right eye: Secondary | ICD-10-CM

## 2020-08-19 DIAGNOSIS — H353211 Exudative age-related macular degeneration, right eye, with active choroidal neovascularization: Secondary | ICD-10-CM | POA: Diagnosis not present

## 2020-08-19 MED ORDER — BEVACIZUMAB 2.5 MG/0.1ML IZ SOSY
2.5000 mg | PREFILLED_SYRINGE | INTRAVITREAL | Status: AC | PRN
  Administered 2020-08-19: 2.5 mg via INTRAVITREAL

## 2020-08-19 NOTE — Assessment & Plan Note (Signed)
CNVM recurrent intraretinal fluid and CME OD at 7-week follow-up post Avastin.  We will repeat injection today and shorten interval examination next to 6 weeks

## 2020-08-19 NOTE — Assessment & Plan Note (Signed)
No signs of active CNVM OS

## 2020-08-19 NOTE — Progress Notes (Signed)
08/19/2020     CHIEF COMPLAINT Patient presents for Retina Follow Up (7 week fu OD and Avastin OD/Pt states VA OU stable since last visit. Pt denies FOL, floaters, or ocular pain OU. /Pt daughter states that she does use E-mycin ung as needed ) and Macular Degeneration (Able to still read, with the right eye her eye with best acuity today at 7-week follow-up)   HISTORY OF PRESENT ILLNESS: Donna Arias is a 87 y.o. female who presents to the clinic today for:   HPI     Retina Follow Up           Diagnosis: Wet AMD   Laterality: right eye   Onset: 7 weeks ago   Severity: mild   Duration: 7 weeks   Course: stable   Comments: 7 week fu OD and Avastin OD Pt states VA OU stable since last visit. Pt denies FOL, floaters, or ocular pain OU.  Pt daughter states that she does use E-mycin ung as needed          Macular Degeneration           Comments: Able to still read, with the right eye her eye with best acuity today at 7-week follow-up       Last edited by Hurman Horn, MD on 08/19/2020 10:36 AM.      Referring physician: Lorrene Reid, PA-C [address] [place],  [place] [address]  HISTORICAL INFORMATION:   Selected notes from the MEDICAL RECORD NUMBER    Lab Results  Component Value Date   HGBA1C 5.6 08/07/2019     CURRENT MEDICATIONS: Current Outpatient Medications (Ophthalmic Drugs)  Medication Sig   erythromycin ophthalmic ointment Place 1 application into the left eye at bedtime.   No current facility-administered medications for this visit. (Ophthalmic Drugs)   Current Outpatient Medications (Other)  Medication Sig   albuterol (PROVENTIL) (2.5 MG/3ML) 0.083% nebulizer solution Take 3 mLs (2.5 mg total) by nebulization every 6 (six) hours as needed for wheezing or shortness of breath.   amLODipine (NORVASC) 2.5 MG tablet TAKE 1 TABLET (2.5 MG TOTAL) BY MOUTH DAILY.   aspirin 81 MG tablet Take 81 mg by mouth daily.   atorvastatin  (LIPITOR) 10 MG tablet TAKE 1 TABLET BY MOUTH DAILY.   benzonatate (TESSALON) 100 MG capsule Take 1 capsule (100 mg total) by mouth 3 (three) times daily as needed for cough.   budesonide (PULMICORT) 0.25 MG/2ML nebulizer solution Take 2 mLs (0.25 mg total) by nebulization in the morning and at bedtime.   Calcium Carbonate-Vit D-Min (CALCIUM 1200 PO) Take 1 tablet by mouth.   fexofenadine (ALLEGRA) 180 MG tablet Take 180 mg by mouth daily.   FLUoxetine (PROZAC) 10 MG capsule TAKE 1 CAPSULE BY MOUTH DAILY.   fluticasone (FLONASE) 50 MCG/ACT nasal spray Place 1 spray into both nostrils 2 (two) times daily.   furosemide (LASIX) 20 MG tablet TAKE 1 TABLET BY MOUTH DAILY.   ketoconazole (NIZORAL) 2 % shampoo USE AS DIRECTED   levothyroxine (SYNTHROID) 25 MCG tablet TAKE 1/2 TABLET BY MOUTH DAILY WITH 50 MCG TO EQUAL 62.5 MCG TOTAL DAILY   levothyroxine (SYNTHROID) 50 MCG tablet TAKE 1 TABLET BY MOUTH EVERY DAY   potassium chloride SA (KLOR-CON) 20 MEQ tablet TAKE 1 TABLET BY MOUTH ONCE DAILY.   risperiDONE (RISPERDAL) 1 MG tablet TAKE 1 TABLET BY MOUTH 2 TIMES DAILY.   STIOLTO RESPIMAT 2.5-2.5 MCG/ACT AERS INHALE 2 PUFFS INTO THE  LUNGS DAILY.   triamcinolone cream (KENALOG) 0.1 % Apply 1 application topically 2 (two) times daily. Request jar   Current Facility-Administered Medications (Other)  Medication Route   ipratropium-albuterol (DUONEB) 0.5-2.5 (3) MG/3ML nebulizer solution 3 mL Nebulization      REVIEW OF SYSTEMS:    ALLERGIES Allergies  Allergen Reactions   Codeine Sulfate     REACTION: unspecified   Picato [Ingenol Mebutate] Hives    PAST MEDICAL HISTORY Past Medical History:  Diagnosis Date   Chronic headaches    COPD (chronic obstructive pulmonary disease) (HCC)    Dementia (HCC)    Eye infection, left 01/16/2018   GERD (gastroesophageal reflux disease)    Heart attack ([place]) 1998   HLD (hyperlipidemia)    Hypertension    Skin cancer    squamous cell of left face    Stroke ([place])    Thyroid disease    Past Surgical History:  Procedure Laterality Date   BREAST LUMPECTOMY Left 1995   DILATION AND CURETTAGE OF UTERUS  1975   GALLBLADDER SURGERY  1993    FAMILY HISTORY Family History  Problem Relation Age of Onset   Hypertension Mother    Stroke Mother    Stroke Sister    Alzheimer's disease Sister    Stroke Sister    Hypertension Sister    Stroke Sister    Breast cancer Sister     SOCIAL HISTORY Social History   Tobacco Use   Smoking status: Former    Types: Cigarettes    Quit date: 1995    Years since quitting: 27.5   Smokeless tobacco: Never  Vaping Use   Vaping Use: Never used  Substance Use Topics   Alcohol use: No   Drug use: No         OPHTHALMIC EXAM:  Base Eye Exam     Visual Acuity (ETDRS)       Right Left   Dist cc 20/50 +1 20/400   Dist ph cc NI          Tonometry (Tonopen, 10:18 AM)       Right Left   Pressure 08 10         Pupils       Pupils Dark Light Shape React APD   Right PERRL 4 3 Round Brisk None   Left PERRL 4 3 Round Brisk None         Visual Fields (Counting fingers)       Left Right    Full Full         Extraocular Movement       Right Left    Full Full         Neuro/Psych     Oriented x3: Yes   Mood/Affect: Normal         Dilation     Right eye: 1.0% Mydriacyl, 2.5% Phenylephrine @ 10:18 AM           Slit Lamp and Fundus Exam     External Exam       Right Left   External Normal Normal         Slit Lamp Exam       Right Left   Lids/Lashes Normal Normal   Conjunctiva/Sclera White and quiet White and quiet   Cornea Clear Clear   Anterior Chamber Deep and quiet Deep and quiet   Iris Round and reactive Round and reactive   Lens Centered posterior chamber intraocular lens Centered posterior chamber  intraocular lens   Anterior Vitreous Normal Normal         Fundus Exam       Right Left   Posterior Vitreous Posterior vitreous  detachment    Disc Peripapillary atrophy    C/D Ratio 0.65 0.6   Macula Retinal pigment epithelial mottling, no macular thickening, Retinal atrophy, Early age related macular degeneration, no serous retinal detachment visible clinically, minor epiretinal membrane not distorting    Vessels Normal    Periphery Normal             IMAGING AND PROCEDURES  Imaging and Procedures for 08/19/20  OCT, Retina - OU - Both Eyes       Right Eye Quality was good. Scan locations included subfoveal. Central Foveal Thickness: 299. Progression has improved. Findings include abnormal foveal contour, cystoid macular edema.   Left Eye Quality was good. Scan locations included subfoveal. Central Foveal Thickness: 130. Progression has been stable. Findings include abnormal foveal contour, inner retinal atrophy, central retinal atrophy, outer retinal atrophy.   Notes Less subretinal fluid, now resolved and perifoveal CME completely resolved as compared to findings November 2021. Currently at 6-week interval. Repeat injection Avastin today and examination again in 8 weeks to maintain acuity in this best functioning eye  OS with macular atrophy from previous retinal vascular disease.  Stable overall     Intravitreal Injection, Pharmacologic Agent - OD - Right Eye       Time Out 08/19/2020. 10:38 AM. Confirmed correct patient, procedure, site, and patient consented.   Anesthesia Topical anesthesia was used. Anesthetic medications included [place] 3.5%.   Procedure Preparation included Ofloxacin , Tobramycin 0.3%, 10% betadine to eyelids, 5% betadine to ocular surface. A 30 gauge needle was used.   Injection: 2.5 mg bevacizumab 2.5 MG/0.1ML   Route: Intravitreal, Site: Right Eye   NDC: [phone], Lot: [id number]   Post-op Post injection exam found visual acuity of at least counting fingers. The patient tolerated the procedure well. There were no complications. The patient received written and  verbal post procedure care education. Post injection medications were not given.              ASSESSMENT/PLAN:  Exudative age-related macular degeneration of left eye with inactive choroidal neovascularization (HCC) No signs of active CNVM OS  Exudative age-related macular degeneration of right eye with active choroidal neovascularization (HCC) CNVM recurrent intraretinal fluid and CME OD at 7-week follow-up post Avastin.  We will repeat injection today and shorten interval examination next to 6 weeks  Posterior vitreous detachment of right eye Physiologic     ICD-10-CM   1. Exudative age-related macular degeneration of right eye with active choroidal neovascularization (HCC)  H35.3211 OCT, Retina - OU - Both Eyes    Intravitreal Injection, Pharmacologic Agent - OD - Right Eye    bevacizumab (AVASTIN) SOSY 2.5 mg    2. Exudative age-related macular degeneration of left eye with inactive choroidal neovascularization ([place])  H35.3222     3. Posterior vitreous detachment of right eye  H43.811       1.  OD with preserved visual acuity from wet AMD.  Nonetheless at 7-week extended interval exam, there is slight recurrence of intraretinal fluid and CME.  We will thus need to repeat injection Avastin today and shorten interval examination next right eye to 6 weeks  2.  3.  Ophthalmic Meds Ordered this visit:  Meds ordered this encounter  Medications   bevacizumab (AVASTIN) SOSY 2.5 mg  Return in about 6 weeks (around 09/30/2020) for DILATE OU, AVASTIN OCT, OD.  There are no Patient Instructions on file for this visit.   Explained the diagnoses, plan, and follow up with the patient and they expressed understanding.  Patient expressed understanding of the importance of proper follow up care.   Clent Demark Mady Oubre M.D. Diseases & Surgery of the Retina and Vitreous Retina [facility] Diabetic [place] 08/19/20     Abbreviations: M myopia (nearsighted); A astigmatism; H  hyperopia (farsighted); P presbyopia; Mrx spectacle prescription;  CTL contact lenses; OD right eye; OS left eye; OU both eyes  XT exotropia; ET esotropia; PEK punctate epithelial keratitis; PEE punctate epithelial erosions; DES dry eye syndrome; MGD meibomian gland dysfunction; ATs artificial tears; PFAT's preservative free artificial tears; [place] nuclear sclerotic cataract; PSC posterior subcapsular cataract; ERM epi-retinal membrane; PVD posterior vitreous detachment; RD retinal detachment; DM diabetes mellitus; DR diabetic retinopathy; NPDR non-proliferative diabetic retinopathy; PDR proliferative diabetic retinopathy; CSME clinically significant macular edema; DME diabetic macular edema; dbh dot blot hemorrhages; CWS cotton wool spot; POAG primary open angle glaucoma; C/D cup-to-disc ratio; HVF humphrey visual field; GVF goldmann visual field; OCT optical coherence tomography; IOP intraocular pressure; BRVO Branch retinal vein occlusion; CRVO central retinal vein occlusion; CRAO central retinal artery occlusion; BRAO branch retinal artery occlusion; RT retinal tear; SB scleral buckle; PPV pars plana vitrectomy; VH Vitreous hemorrhage; PRP panretinal laser photocoagulation; IVK intravitreal kenalog; VMT vitreomacular traction; MH Macular hole;  NVD neovascularization of the disc; NVE neovascularization elsewhere; AREDS age related eye disease study; ARMD age related macular degeneration; POAG primary open angle glaucoma; EBMD epithelial/anterior basement membrane dystrophy; ACIOL anterior chamber intraocular lens; IOL intraocular lens; PCIOL posterior chamber intraocular lens; Phaco/IOL phacoemulsification with intraocular lens placement; [place] photorefractive keratectomy; LASIK laser assisted in situ keratomileusis; HTN hypertension; DM diabetes mellitus; COPD chronic obstructive pulmonary disease

## 2020-08-19 NOTE — Assessment & Plan Note (Signed)
Physiologic 

## 2020-08-26 ENCOUNTER — Telehealth: Payer: Self-pay | Admitting: Physician Assistant

## 2020-08-26 ENCOUNTER — Other Ambulatory Visit: Payer: Self-pay

## 2020-08-26 ENCOUNTER — Telehealth: Payer: Self-pay | Admitting: Internal Medicine

## 2020-08-26 ENCOUNTER — Ambulatory Visit (INDEPENDENT_AMBULATORY_CARE_PROVIDER_SITE_OTHER): Payer: Medicare HMO

## 2020-08-26 ENCOUNTER — Ambulatory Visit
Admission: EM | Admit: 2020-08-26 | Discharge: 2020-08-26 | Disposition: A | Discharge status: Home / Self Care | Payer: Medicare HMO | Attending: Urgent Care | Admitting: Urgent Care

## 2020-08-26 DIAGNOSIS — R062 Wheezing: Secondary | ICD-10-CM

## 2020-08-26 DIAGNOSIS — R059 Cough, unspecified: Secondary | ICD-10-CM

## 2020-08-26 DIAGNOSIS — J449 Chronic obstructive pulmonary disease, unspecified: Secondary | ICD-10-CM | POA: Diagnosis not present

## 2020-08-26 DIAGNOSIS — Z20822 Contact with and (suspected) exposure to covid-19: Secondary | ICD-10-CM

## 2020-08-26 MED ORDER — PREDNISONE 10 MG PO TABS
ORAL_TABLET | ORAL | 0 refills | Status: DC
Start: 2020-08-26 — End: 2020-10-03

## 2020-08-26 MED ORDER — CEPHALEXIN 500 MG PO CAPS: 500.0000 mg | ORAL_CAPSULE | Freq: Three times a day (TID) | ORAL | 0 refills | Status: DC

## 2020-08-26 MED ORDER — CEPHALEXIN 500 MG PO CAPS: 500.0000 mg | ORAL_CAPSULE | Freq: Three times a day (TID) | ORAL | 0 refills | Status: AC

## 2020-08-26 MED ORDER — BENZONATATE 100 MG PO CAPS: 100.0000 mg | ORAL_CAPSULE | Freq: Three times a day (TID) | ORAL | 1 refills | Status: DC | PRN

## 2020-08-26 NOTE — ED Triage Notes (Signed)
Per daughter pts pulmonologist sent her here for a COVID PCR test. States pt has had coughing/wheezing/SOB worse yesterday. States hx of aspiration. Pt speaking in complete sentences with coughing afterwards. States rx'd prednisone and keflex today.

## 2020-08-26 NOTE — Telephone Encounter (Signed)
Patient's daughter, mary(DPR) is aware of recommendations and voiced her understanding.  Rx for Cephalexin and prednisone has been sent to preferred pharmacy.  Nothing further needed.

## 2020-08-26 NOTE — Telephone Encounter (Signed)
Error

## 2020-08-26 NOTE — Telephone Encounter (Signed)
Attempted to call pt's daughter [name] but unable to reach. Left message for her to return call.

## 2020-08-26 NOTE — Telephone Encounter (Signed)
Called and spoke patient's daughter, [name](DPR). [name] stated that patient is experiencing throat clearing, prod cough with clear sputum, sneezing, wheezing and increased sob x1w. Denied f/c/s or additional sx.  She does not wear supplemental oxygen. Unable to monitor spo2.   Patient started tessalon and neb solution this morning. Using pulmicort BID and stiolto once daily.  Fully vaccinated against covid and flu.  Dr. Chase Caller, please advise. Thanks

## 2020-08-26 NOTE — Telephone Encounter (Signed)
Definitely get covid tested - antigen first - if negative, get PCR. If any positive - call us for consideration of Rx  Otherwise can do  Please take prednisone 40 mg x1 day, then 30 mg x1 day, then 20 mg x1 day, then 10 mg x1 day, and then 5 mg x1 day and stop  cephalexin '500mg'$  three times daily x  5 days    Allergies  Allergen Reactions   Codeine Sulfate     REACTION: unspecified   Picato [Ingenol Mebutate] Hives

## 2020-08-26 NOTE — Telephone Encounter (Signed)
Looked at pt's chart and saw that pt was currently at [facility] having the covid test performed.  Called and spoke with pt's daughter [name] letting her know that pt should continue to use the Pulmicort neb sol as prescribed and that pt could use the albuterol as needed. Also stated to her that pt could use the tessalon as needed for the cough and she verbalized understanding.  Routing to MR as an FYI that we need to keep an eye out for pt's covid test results. Results will be able to be seen in Epic.  Also routing to myself to be on the look out for the results.

## 2020-08-26 NOTE — Telephone Encounter (Signed)
Pts daughter returning call from office . Pt is currently admitted in the hospital so daughter may not answer call . She mentioned if no answer please leave detailed message on VM pertaining to prior message   Call back [phone]

## 2020-08-26 NOTE — ED Provider Notes (Signed)
[name]   MRN: [id number] DOB: [birth year]  Subjective:   Donna Arias is a 87 y.o. female presenting for 2 to 3-day history of acute onset recurrent coughing, wheezing and intermittent shortness of breath.  Patient presents with her daughter who states she has been aspirating some.  Has a history of COPD and is currently on Pulmicort, Stiolto Respimat as managed by their pulmonologist.  She went and got a COVID test at home which was negative.  Contacted the pulmonologist and was advised to get a PCR COVID test.  They did prescribe prednisone, Keflex and cough capsules.   Current Facility-Administered Medications:    ipratropium-albuterol (DUONEB) 0.5-2.5 (3) MG/3ML nebulizer solution 3 mL, 3 mL, Nebulization, Q6H, Danford, Katy D, NP, 3 mL at 12/08/17 0958  Current Outpatient Medications:    albuterol (PROVENTIL) (2.5 MG/3ML) 0.083% nebulizer solution, Take 3 mLs (2.5 mg total) by nebulization every 6 (six) hours as needed for wheezing or shortness of breath., Disp: 75 mL, Rfl: 6   amLODipine (NORVASC) 2.5 MG tablet, TAKE 1 TABLET (2.5 MG TOTAL) BY MOUTH DAILY., Disp: 90 tablet, Rfl: 0   aspirin 81 MG tablet, Take 81 mg by mouth daily., Disp: , Rfl:    atorvastatin (LIPITOR) 10 MG tablet, TAKE 1 TABLET BY MOUTH DAILY., Disp: 90 tablet, Rfl: 0   benzonatate (TESSALON) 100 MG capsule, Take 1 capsule (100 mg total) by mouth 3 (three) times daily as needed for cough., Disp: 30 capsule, Rfl: 0   budesonide (PULMICORT) 0.25 MG/2ML nebulizer solution, Take 2 mLs (0.25 mg total) by nebulization in the morning and at bedtime., Disp: 120 mL, Rfl: 11   Calcium Carbonate-Vit D-Min (CALCIUM 1200 PO), Take 1 tablet by mouth., Disp: , Rfl:    cephALEXin (KEFLEX) 500 MG capsule, Take 1 capsule (500 mg total) by mouth 3 (three) times daily for 5 days., Disp: 15 capsule, Rfl: 0   erythromycin ophthalmic ointment, Place 1 application into the left eye at bedtime., Disp: 3.5 g, Rfl: 2    fexofenadine (ALLEGRA) 180 MG tablet, Take 180 mg by mouth daily., Disp: , Rfl:    FLUoxetine (PROZAC) 10 MG capsule, TAKE 1 CAPSULE BY MOUTH DAILY., Disp: 90 capsule, Rfl: 0   fluticasone (FLONASE) 50 MCG/ACT nasal spray, Place 1 spray into both nostrils 2 (two) times daily., Disp: 16 g, Rfl: 6   furosemide (LASIX) 20 MG tablet, TAKE 1 TABLET BY MOUTH DAILY., Disp: 90 tablet, Rfl: 0   ketoconazole (NIZORAL) 2 % shampoo, USE AS DIRECTED, Disp: 120 mL, Rfl: 1   levothyroxine (SYNTHROID) 25 MCG tablet, TAKE 1/2 TABLET BY MOUTH DAILY WITH 50 MCG TO EQUAL 62.5 MCG TOTAL DAILY, Disp: 45 tablet, Rfl: 0   levothyroxine (SYNTHROID) 50 MCG tablet, TAKE 1 TABLET BY MOUTH EVERY DAY, Disp: 90 tablet, Rfl: 0   potassium chloride SA (KLOR-CON) 20 MEQ tablet, TAKE 1 TABLET BY MOUTH ONCE DAILY., Disp: 90 tablet, Rfl: 0   predniSONE (DELTASONE) 10 MG tablet, 4tabx1d, 3tabx1d,2tab x1 1tabx1, 0.5x1d, Disp: 11 tablet, Rfl: 0   risperiDONE (RISPERDAL) 1 MG tablet, TAKE 1 TABLET BY MOUTH 2 TIMES DAILY., Disp: 120 tablet, Rfl: 0   STIOLTO RESPIMAT 2.5-2.5 MCG/ACT AERS, INHALE 2 PUFFS INTO THE LUNGS DAILY., Disp: 4 g, Rfl: 2   triamcinolone cream (KENALOG) 0.1 %, Apply 1 application topically 2 (two) times daily. Request jar, Disp: 453 g, Rfl: 0   Allergies  Allergen Reactions   Codeine Sulfate     REACTION: unspecified  Picato [Ingenol Mebutate] Hives    Past Medical History:  Diagnosis Date   Chronic headaches    COPD (chronic obstructive pulmonary disease) (HCC)    Dementia (HCC)    Eye infection, left 01/16/2018   GERD (gastroesophageal reflux disease)    Heart attack ([place]) 1998   HLD (hyperlipidemia)    Hypertension    Skin cancer    squamous cell of left face   Stroke ([place])    Thyroid disease      Past Surgical History:  Procedure Laterality Date   BREAST LUMPECTOMY Left 1995   DILATION AND CURETTAGE OF [place]    Family History  Problem Relation Age of  Onset   Hypertension Mother    Stroke Mother    Stroke Sister    Alzheimer's disease Sister    Stroke Sister    Hypertension Sister    Stroke Sister    Breast cancer Sister     Social History   Tobacco Use   Smoking status: Former    Types: Cigarettes    Quit date: 1995    Years since quitting: 27.6   Smokeless tobacco: Never  Vaping Use   Vaping Use: Never used  Substance Use Topics   Alcohol use: No   Drug use: No    ROS   Objective:   Vitals: BP (!) 156/70 (BP Location: Left Arm)   Pulse 81   Temp 98.6 F (37 C) (Oral)   Resp 20   SpO2 94%   Physical Exam Constitutional:      General: She is not in acute distress.    Appearance: Normal appearance. She is well-developed. She is not ill-appearing, toxic-appearing or diaphoretic.  HENT:     Head: Normocephalic and atraumatic.     Nose: Nose normal.     Mouth/Throat:     Mouth: Mucous membranes are moist.  Eyes:     Extraocular Movements: Extraocular movements intact.     Pupils: Pupils are equal, round, and reactive to light.  Cardiovascular:     Rate and Rhythm: Normal rate and regular rhythm.     Pulses: Normal pulses.     Heart sounds: Normal heart sounds. No murmur heard.   No friction rub. No gallop.  Pulmonary:     Effort: Pulmonary effort is normal. No respiratory distress.     Breath sounds: No stridor. Examination of the right-middle field reveals rhonchi. Examination of the left-middle field reveals rhonchi. Rhonchi present. No wheezing or rales.  Skin:    General: Skin is warm and dry.     Findings: No rash.  Neurological:     Mental Status: She is alert and oriented to person, place, and time.  Psychiatric:        Mood and Affect: Mood normal.        Behavior: Behavior normal.        Thought Content: Thought content normal.    DG Chest 2 View  Result Date: 08/26/2020 CLINICAL DATA:  Cough wheezing, history of aspiration. EXAM: CHEST - 2 VIEW COMPARISON:  Esophagram from May 09, 2020. FINDINGS: Image rotated slightly to the RIGHT. Accounting for this cardiomediastinal contours and hilar structures are stable as compared to previous chest x-ray from December of 2021. Mild prominence, course interstitial prominence at the lung bases is stable compared to previous imaging. No lobar consolidation. No sign of pleural effusion. Flattening of RIGHT and LEFT hemidiaphragm. On limited assessment no acute  skeletal process. IMPRESSION: Stable chest x-ray. Mild prominence of coarse basilar interstitial markings without consolidation or effusion. Electronically Signed   By: Zetta Bills M.D.   On: 08/26/2020 14:33     Assessment and Plan :   PDMP not reviewed this encounter.  1. Chronic obstructive pulmonary disease, unspecified COPD type ([place])   2. Encounter for screening laboratory testing for COVID-19 virus   3. Cough   4. Wheezing     Patient has already received prescriptions as prescribed by her pulmonologist and I highly recommended following through with this especially prednisone course and cough suppression medication.  COVID-19 testing pending. Counseled patient on potential for adverse effects with medications prescribed/recommended today, ER and return-to-clinic precautions discussed, patient verbalized understanding.    Jaynee Eagles, PA-C 08/26/20 1500

## 2020-08-27 LAB — SARS-COV-2, NAA 2 DAY TAT

## 2020-08-27 LAB — NOVEL CORONAVIRUS, NAA: SARS-CoV-2, NAA: NOT DETECTED

## 2020-08-27 NOTE — Telephone Encounter (Signed)
Checked pt's chart to see if her covid test results had come back yet and they have come back. Covid test was negative.   Called and spoke with pt's daughter Donna Arias letting her know this info and stated to her that we would see pt as scheduled 8/11 which [facility] verbalized understanding. Donna Arias stated that pt is feeling some better today.  Routing to MR as an Pharmacist, hospital. Nothing further needed.

## 2020-08-30 ENCOUNTER — Ambulatory Visit
Admission: RE | Admit: 2020-08-30 | Discharge: 2020-08-30 | Disposition: A | Discharge status: Home / Self Care | Payer: Medicare HMO | Source: Ambulatory Visit | Attending: Physician Assistant | Admitting: Physician Assistant

## 2020-08-30 ENCOUNTER — Other Ambulatory Visit: Payer: Self-pay

## 2020-08-30 VITALS — BP 124/70 | HR 75 | Temp 97.6°F | Resp 20

## 2020-08-30 DIAGNOSIS — J449 Chronic obstructive pulmonary disease, unspecified: Secondary | ICD-10-CM

## 2020-08-30 DIAGNOSIS — R062 Wheezing: Secondary | ICD-10-CM

## 2020-08-30 DIAGNOSIS — R059 Cough, unspecified: Secondary | ICD-10-CM

## 2020-08-30 NOTE — ED Provider Notes (Signed)
[place]   MRN: [id number] DOB: 1926/07/17  Subjective:   Donna Arias is a 87 y.o. female presenting for recheck on ongoing cough.  Was last seen on 08/02, had a negative chest x-ray.  She had already contacted her pulmonologist and had been prescribed an antibiotic, benzonatate and a prednisone course.  She still has not finished the prednisone course.  Presents with her daughter who is anxious and wants to make sure that she does not have to go to the hospital.  Was restarted on her budesonide not too long ago, is taking this twice daily.  She is also scheduling nebulizer treatments with her albuterol.  Has a follow up appt with her pulmonologist this upcoming Thursday.    Current Facility-Administered Medications:    ipratropium-albuterol (DUONEB) 0.5-2.5 (3) MG/3ML nebulizer solution 3 mL, 3 mL, Nebulization, Q6H, Danford, Katy D, NP, 3 mL at 12/08/17 0958  Current Outpatient Medications:    albuterol (PROVENTIL) (2.5 MG/3ML) 0.083% nebulizer solution, Take 3 mLs (2.5 mg total) by nebulization every 6 (six) hours as needed for wheezing or shortness of breath., Disp: 75 mL, Rfl: 6   amLODipine (NORVASC) 2.5 MG tablet, TAKE 1 TABLET (2.5 MG TOTAL) BY MOUTH DAILY., Disp: 90 tablet, Rfl: 0   aspirin 81 MG tablet, Take 81 mg by mouth daily., Disp: , Rfl:    atorvastatin (LIPITOR) 10 MG tablet, TAKE 1 TABLET BY MOUTH DAILY., Disp: 90 tablet, Rfl: 0   benzonatate (TESSALON) 100 MG capsule, Take 1 capsule (100 mg total) by mouth 3 (three) times daily as needed for cough., Disp: 30 capsule, Rfl: 1   budesonide (PULMICORT) 0.25 MG/2ML nebulizer solution, Take 2 mLs (0.25 mg total) by nebulization in the morning and at bedtime., Disp: 120 mL, Rfl: 11   Calcium Carbonate-Vit D-Min (CALCIUM 1200 PO), Take 1 tablet by mouth., Disp: , Rfl:    cephALEXin (KEFLEX) 500 MG capsule, Take 1 capsule (500 mg total) by mouth 3 (three) times daily for 5 days., Disp: 15 capsule, Rfl: 0    erythromycin ophthalmic ointment, Place 1 application into the left eye at bedtime., Disp: 3.5 g, Rfl: 2   fexofenadine (ALLEGRA) 180 MG tablet, Take 180 mg by mouth daily., Disp: , Rfl:    FLUoxetine (PROZAC) 10 MG capsule, TAKE 1 CAPSULE BY MOUTH DAILY., Disp: 90 capsule, Rfl: 0   fluticasone (FLONASE) 50 MCG/ACT nasal spray, Place 1 spray into both nostrils 2 (two) times daily., Disp: 16 g, Rfl: 6   furosemide (LASIX) 20 MG tablet, TAKE 1 TABLET BY MOUTH DAILY., Disp: 90 tablet, Rfl: 0   ketoconazole (NIZORAL) 2 % shampoo, USE AS DIRECTED, Disp: 120 mL, Rfl: 1   levothyroxine (SYNTHROID) 25 MCG tablet, TAKE 1/2 TABLET BY MOUTH DAILY WITH 50 MCG TO EQUAL 62.5 MCG TOTAL DAILY, Disp: 45 tablet, Rfl: 0   levothyroxine (SYNTHROID) 50 MCG tablet, TAKE 1 TABLET BY MOUTH EVERY DAY, Disp: 90 tablet, Rfl: 0   potassium chloride SA (KLOR-CON) 20 MEQ tablet, TAKE 1 TABLET BY MOUTH ONCE DAILY., Disp: 90 tablet, Rfl: 0   predniSONE (DELTASONE) 10 MG tablet, 4tabx1d, 3tabx1d,2tab x1 1tabx1, 0.5x1d, Disp: 11 tablet, Rfl: 0   risperiDONE (RISPERDAL) 1 MG tablet, TAKE 1 TABLET BY MOUTH 2 TIMES DAILY., Disp: 120 tablet, Rfl: 0   STIOLTO RESPIMAT 2.5-2.5 MCG/ACT AERS, INHALE 2 PUFFS INTO THE LUNGS DAILY., Disp: 4 g, Rfl: 2   triamcinolone cream (KENALOG) 0.1 %, Apply 1 application topically 2 (two) times daily. Request jar,  Disp: 453 g, Rfl: 0   Allergies  Allergen Reactions   Codeine Sulfate     REACTION: unspecified   Picato [Ingenol Mebutate] Hives    Past Medical History:  Diagnosis Date   Chronic headaches    COPD (chronic obstructive pulmonary disease) (HCC)    Dementia (HCC)    Eye infection, left 01/16/2018   GERD (gastroesophageal reflux disease)    Heart attack ([place]) 1998   HLD (hyperlipidemia)    Hypertension    Skin cancer    squamous cell of left face   Stroke ([place])    Thyroid disease      Past Surgical History:  Procedure Laterality Date   BREAST LUMPECTOMY Left 1995   DILATION  AND CURETTAGE OF [place]    Family History  Problem Relation Age of Onset   Hypertension Mother    Stroke Mother    Stroke Sister    Alzheimer's disease Sister    Stroke Sister    Hypertension Sister    Stroke Sister    Breast cancer Sister     Social History   Tobacco Use   Smoking status: Former    Types: Cigarettes    Quit date: 1995    Years since quitting: 27.6   Smokeless tobacco: Never  Vaping Use   Vaping Use: Never used  Substance Use Topics   Alcohol use: No   Drug use: No    ROS   Objective:   Vitals: BP 124/70 (BP Location: Left Arm)   Pulse 75   Temp 97.6 F (36.4 C) (Oral)   Resp 20   SpO2 95%   Physical Exam Constitutional:      General: She is not in acute distress.    Appearance: Normal appearance. She is well-developed. She is not ill-appearing, toxic-appearing or diaphoretic.  HENT:     Head: Normocephalic and atraumatic.     Nose: Nose normal.     Mouth/Throat:     Mouth: Mucous membranes are moist.  Eyes:     General: No scleral icterus.       Right eye: No discharge.        Left eye: No discharge.     Extraocular Movements: Extraocular movements intact.     Conjunctiva/sclera: Conjunctivae normal.     Pupils: Pupils are equal, round, and reactive to light.  Cardiovascular:     Rate and Rhythm: Normal rate and regular rhythm.     Pulses: Normal pulses.     Heart sounds: Normal heart sounds. No murmur heard.   No friction rub. No gallop.  Pulmonary:     Effort: Pulmonary effort is normal. No respiratory distress.     Breath sounds: No stridor. Wheezing (mild in bibasilar fields) present. No rhonchi or rales.  Skin:    General: Skin is warm and dry.     Findings: No rash.  Neurological:     Mental Status: She is alert and oriented to person, place, and time.  Psychiatric:        Mood and Affect: Mood normal.        Behavior: Behavior normal.        Thought Content: Thought content normal.         Judgment: Judgment normal.    Assessment and Plan :   PDMP not reviewed this encounter.  1. Chronic obstructive pulmonary disease, unspecified COPD type ([place])   2. Cough   3. Wheezing  Patient has stable vital signs, very similar cardiopulmonary exam to her previous visit 4 days ago.  At this time I feel it would be inappropriate to send her to the emergency room or repeat her chest x-ray.  Overall according to her daughter and my overall assessment she is better than her last visit.  Her daughter requested a steroid injection but I advised against this that she is still taking the steroid course that was prescribed to her by her pulmonologist.  Advised that the risk of harm outweighs potential benefit especially as she has been on steroids already and is improving.  Recommended that she maintain the appointment with her pulmonologist this upcoming Thursday. Counseled patient on potential for adverse effects with medications prescribed/recommended today, ER and return-to-clinic precautions discussed, patient verbalized understanding.    Jaynee Eagles, PA-C 08/30/20 1326

## 2020-08-30 NOTE — ED Triage Notes (Signed)
Pt was last seen at [facility] on 8/2 for the same sxs, (cough, sob and wheezing). Today, her daughter reports that the sob has decreased, but the cough has not changed. Since yesterday, Pt has been using albuterol and pulmicort tx with some decrease in sxs. Pt has almost completed her course of antibiotics and steorida.

## 2020-09-02 ENCOUNTER — Other Ambulatory Visit: Payer: Self-pay | Admitting: Physician Assistant

## 2020-09-02 DIAGNOSIS — Z760 Encounter for issue of repeat prescription: Secondary | ICD-10-CM

## 2020-09-04 ENCOUNTER — Encounter: Payer: Self-pay | Admitting: Primary Care

## 2020-09-04 ENCOUNTER — Ambulatory Visit: Payer: Medicare HMO | Admitting: Primary Care

## 2020-09-04 ENCOUNTER — Other Ambulatory Visit: Payer: Self-pay

## 2020-09-04 DIAGNOSIS — R0982 Postnasal drip: Secondary | ICD-10-CM | POA: Diagnosis not present

## 2020-09-04 DIAGNOSIS — J441 Chronic obstructive pulmonary disease with (acute) exacerbation: Secondary | ICD-10-CM | POA: Diagnosis not present

## 2020-09-04 NOTE — Progress Notes (Signed)
$'@Patient's$  ID: Donna Arias, female    DOB: 1927-01-21, 87 y.o.   MRN: [id number]  No chief complaint on file.   Referring provider: Lorrene Reid, PA-C  HPI: 87 year old female, former smoker quit in 1995.  Past medical history significant for COPD, chronic sinusitis, hypertension, macular degeneration, transient cerebral ischemia, GERD, dysphagia, hypothyroidism, dementia, age-related osteoporosis, hyperlipidemia.  Patient of Dr. Chase Caller, seen in office on 06/04/2020.  Maintained on Stiolto Respimat and Pulmicort 0.'25mg'$  neb BID.  09/04/2020 Patient presents today for 22-monthfollow-up.  Since her last visit[facility].  She originally called our office on 08/26/2020 with reports of recurrent cough, wheezing and intermittent shortness of breath.  She was sent in prescription for prednisone taper and Keflex.  No additional treatments were prescribed or given at urgent care. Covid test was negative.   She is feeling a lot better today. Her daughter states that she still has some cough and wheezing.  Breathing has been better, states that she is not as short of breath as she was. She is currently using Stiolto 2 puffs in the morning and pulmicort nebulizer twice a day. She has PND symptoms, she is working really hard to get up mucus. She will have coughing fits when she eats. Her daughter is watching her when she eats and trying to reinforce small sips and bites.    Allergies  Allergen Reactions   Codeine Sulfate     REACTION: unspecified   Picato [Ingenol Mebutate] Hives    Immunization History  Administered Date(s) Administered   Fluad Quad(high Dose 65+) 11/06/2018, 12/11/2019   H1N1 02/26/2008   Influenza Split 12/02/2010, 10/07/2011   Influenza Whole 11/17/2006, 10/27/2007, 12/01/2009   Influenza, High Dose Seasonal PF 10/15/2014, 11/10/2015, 10/25/2016, 10/27/2017   Influenza,inj,Quad PF,6+ Mos 11/10/2012,  09/18/2013   PFIZER(Purple Top)SARS-COV-2 Vaccination 01/30/2019, 04/01/2019, 05/01/2019   Pneumococcal Conjugate-13 04/30/2013   Pneumococcal Polysaccharide-23 01/26/2003   Td 01/25/1997, 02/26/2008   Zoster Recombinat (Shingrix) 11/30/2019, 01/31/2020   Zoster, Live 04/08/2008    Past Medical History:  Diagnosis Date   Chronic headaches    [place]    D[place]    Eye infection, left 01/16/2018   GERD (gastroesophageal reflux disease)    [place]   HLD (hyperlipidemia)    Hypertension    Skin cancer    squamous cell of left face   [place]roke ([place]    Thyroid disease     Tobacco History: Social History   Tobacco Use  Smoking Status Former   Types: Cigarettes   Quit date: 1995   Years since quitting: 27.7  Smokeless Tobacco Never   Counseling given: Not Answered   Outpatient Medications Prior to Visit  Medication Sig Dispense Refill   albuterol (PROVENTIL) (2.5 MG/3ML) 0.083% nebulizer solution Take 3 mLs (2.5 mg total) by nebulization every 6 (six) hours as needed for wheezing or shortness of breath. 75 mL 6   aspirin 81 MG tablet Take 81 mg by mouth daily.     benzonatate (TESSALON) 100 MG capsule Take 1 capsule (100 mg total) by mouth 3 (three) times daily as needed for cough. 30 capsule 1   budesonide (PULMICORT) 0.25 MG/2ML nebulizer solution Take 2 mLs (0.25 mg total) by nebulization in the morning and at bedtime. 120 mL 11   Calcium Carbonate-Vit D-Min (CALCIUM 1200 PO) Take 1 tablet by mouth.  erythromycin ophthalmic ointment Place 1 application into the left eye at bedtime. 3.5 g 2   fexofenadine (ALLEGRA) 180 MG tablet Take 180 mg by mouth daily.     fluticasone (FLONASE) 50 MCG/ACT nasal spray Place 1 spray into both nostrils 2 (two) times daily. 16 g 6   furosemide (LASIX) 20 MG tablet TAKE 1 TABLET BY MOUTH DAILY. 90 tablet 0   levothyroxine (SYNTHROID) 50 MCG tablet TAKE 1 TABLET BY MOUTH EVERY  DAY 90 tablet 0   potassium chloride SA (KLOR-CON) 20 MEQ tablet TAKE 1 TABLET BY MOUTH ONCE DAILY. 90 tablet 0   risperiDONE (RISPERDAL) 1 MG tablet TAKE 1 TABLET BY MOUTH 2 TIMES DAILY. 120 tablet 0   STIOLTO RESPIMAT 2.5-2.5 MCG/ACT AERS INHALE 2 PUFFS INTO THE LUNGS DAILY. 4 g 2   triamcinolone cream (KENALOG) 0.1 % Apply 1 application topically 2 (two) times daily. Request jar 453 g 0   amLODipine (NORVASC) 2.5 MG tablet TAKE 1 TABLET (2.5 MG TOTAL) BY MOUTH DAILY. 90 tablet 0   atorvastatin (LIPITOR) 10 MG tablet TAKE 1 TABLET BY MOUTH DAILY. 90 tablet 0   FLUoxetine (PROZAC) 10 MG capsule TAKE 1 CAPSULE BY MOUTH DAILY. 90 capsule 0   ketoconazole (NIZORAL) 2 % shampoo USE AS DIRECTED 120 mL 1   levothyroxine (SYNTHROID) 25 MCG tablet TAKE 1/2 TABLET BY MOUTH DAILY WITH 50 MCG TO EQUAL 62.5 MCG TOTAL DAILY 45 tablet 0   predniSONE (DELTASONE) 10 MG tablet 4tabx1d, 3tabx1d,2tab x1 1tabx1, 0.5x1d 11 tablet 0   Facility-Administered Medications Prior to Visit  Medication Dose Route Frequency Provider Last Rate Last Admin   ipratropium-albuterol (DUONEB) 0.5-2.5 (3) MG/3ML nebulizer solution 3 mL  3 mL Nebulization Q6H Danford, Katy D, NP   3 mL at 12/08/17 [id number]      Review of Systems  Review of Systems  Constitutional: Negative.   HENT:  Positive for trouble swallowing.   Respiratory:  Positive for cough. Negative for chest tightness, shortness of breath and wheezing.     Physical Exam  BP 112/60 (BP Location: Left Arm, Patient Position: Sitting, Cuff Size: Normal)   Pulse 61   Temp 97.7 F (36.5 C) (Oral)   Ht '5\' 4"'$  (1.626 m)   Wt 150 lb 6.4 oz (68.2 kg)   SpO2 96%   BMI 25.82 kg/m  Physical Exam Constitutional:      Appearance: Normal appearance.  HENT:     Mouth/Throat:     Comments: Deferred d/t masking Cardiovascular:     Rate and Rhythm: Normal rate.  Pulmonary:     Effort: Pulmonary effort is normal.  Neurological:     General: No focal deficit present.      Mental Status: She is alert and oriented to person, place, and time. Mental status is at baseline.  Psychiatric:        Mood and Affect: Mood normal.        Behavior: Behavior normal.        Thought Content: Thought content normal.     Lab Results:  CBC    Component Value Date/Time   WBC 6.7 05/23/2020 0932   WBC 7.1 03/20/2020 1409   RBC 4.51 05/23/2020 0932   RBC 4.05 03/20/2020 1409   HGB 14.1 05/23/2020 0932   HCT 41.9 05/23/2020 0932   PLT 190 05/23/2020 0932   MCV 93 05/23/2020 0932   MCH 31.3 05/23/2020 0932   MCHC 33.7 05/23/2020 0932   MCHC 33.3 03/20/2020 1409  RDW 14.5 05/23/2020 0932   LYMPHSABS 2.1 05/23/2020 0932   MONOABS 0.5 03/20/2020 1409   EOSABS 0.2 05/23/2020 0932   BASOSABS 0.0 05/23/2020 0932    BMET    Component Value Date/Time   NA 145 (H) 05/23/2020 0932   K 4.2 05/23/2020 0932   CL 104 05/23/2020 0932   CO2 22 05/23/2020 0932   GLUCOSE 95 05/23/2020 0932   GLUCOSE 108 (H) 08/01/2015 0933   BUN 20 05/23/2020 0932   CREATININE 1.21 (H) 05/23/2020 0932   CALCIUM 10.0 05/23/2020 0932   GFRNONAA 42 (L) 02/18/2020 0844   GFRAA 49 (L) 02/18/2020 0844    BNP No results found for: BNP  ProBNP No results found for: PROBNP  Imaging: Intravitreal Injection, Pharmacologic Agent - OD - Right Eye  Result Date: 09/30/2020 Time Out 09/30/2020. 10:49 AM. Confirmed correct patient, procedure, site, and patient consented. Anesthesia Topical anesthesia was used. Anesthetic medications included [place] 3.5%. Procedure Preparation included Ofloxacin , Tobramycin 0.3%, 10% betadine to eyelids, 5% betadine to ocular surface. A 30 gauge needle was used. Injection: 2.5 mg bevacizumab 2.5 MG/0.1ML   Route: Intravitreal, Site: Right Eye   NDC: [phone], Lot: [id number] Post-op Post injection exam found visual acuity of at least counting fingers. The patient tolerated the procedure well. There were no complications. The patient received written and verbal post  procedure care education. Post injection medications were not given.   OCT, Retina - OU - Both Eyes  Result Date: 09/30/2020 Right Eye Quality was good. Scan locations included subfoveal. Central Foveal Thickness: 281. Progression has improved. Findings include abnormal foveal contour. Left Eye Quality was good. Scan locations included subfoveal. Central Foveal Thickness: 130. Progression has been stable. Findings include abnormal foveal contour, inner retinal atrophy, central retinal atrophy, outer retinal atrophy. Notes Now with no subretinal fluid and no intraretinal fluid.  OD.  Vastly improved at 5-week interval. OS with macular atrophy from previous retinal vascular disease.  Stable overall    Assessment & Plan:   COPD exacerbation ([place]) - Recently treated for AECOPD with prednisone taper and Keflex. Symptoms have improved significantly. She still has a congested cough and difficulty expectorating mucus. Advised she start regular mucinex '600mg'$  BID as needed for congestion. Continue Stiolto Respimat, Pulmicort nebulizer BID.   Postnasal drip - Continue Nascort nasal spray - Start ocean/saline nasal spray twice a day   FU in 3 months with Dr. Illene Labrador, NP 10/12/2020

## 2020-09-04 NOTE — Patient Instructions (Addendum)
Recommendations: - Continue Nascort nasal spray - Start ocean/saline nasal spray twice a day (over the counter) - Start regular mucinex 600 twice a day as needed for congestion (over the counter)  Follow-up: - 3 months with Dr. Chase Caller (15 min slot ok)    Aspiration Precautions, Adult Aspiration is the breathing in (inhalation) of a liquid or other material into the lungs. Adults with neurologicalimpairments, swallowing difficulties (dysphagia), decreased gag reflex, or impaired physical mobility are at risk for aspiration. Things that can be inhaled into the lungs include: Food. Any type of liquid, such as drinks or saliva. Stomach contents, such as vomit or stomach acid. Aspiration can cause pneumonia. You can take steps to reduce the risk ofaspiration. What are the signs of aspiration? Signs of aspiration include: Coughing: Coughing after swallowing food or liquids. Coughing up phlegm (sputum) that: Is yellow, tan, or green. Has pieces of food in it. Smells bad. Coughing when lying down or having to sit up quickly after lying down. Having a hoarse, barky cough. Trouble breathing. This may include: Breathing quickly. Breathing very slowly. Loud breathing. Rumbling sounds from the lungs while breathing. Eating troubles, such as: Clearing the throat often while eating. Drooling while eating. A feeling of fullness in the throat or a feeling that something is stuck in the throat. Having a runny nose while eating. Watery eyes while eating. A pained look on the face while eating. Speaking problems such as: Not being able to speak. A hoarse voice. Choking often. A change in skin color. The skin may look red or blue. Fever. Pain in the chest or back. What are the complications of aspiration? Complications of aspiration include: Losing weight because the person is not absorbing needed nutrients. Loss of enjoyment and the social benefits of eating. Choking. Lung  irritation, if someone aspirates acidic food or drinks. Lung infection (pneumonia). Lung abscess, which is a collection of infected liquid (pus) in the lungs. In serious cases, death can occur. What can I do to prevent aspiration? Caring for someone who has a feeding tube If you are caring for someone who has a feeding tube and cannot eat or drink safely through his or her mouth: Keep the person in an upright position as much as possible. Do not lay the person flat if he or she is getting continuous feedings. If you need to lay the person flat for any reason, turn the feeding pump off. Check feeding tube residuals as told by the health care provider. Ask the health care provider what residual amount is too high. Caring for someone who can eat and drink safely by mouth If you are caring for someone who can eat and drink safely by mouth: Have the person sit in an upright position when eating food or drinking fluids. This can be done in two ways: Have the person sit up in a chair. If sitting in a chair is not possible, position the person in bed so he or she is upright. Remind the person to eat slowly and chew well. Make sure the person is awake and alert while eating. Never put food or liquids in the mouth of a person who is not fully alert. Do not distract the person. This is especially important for people with thinking or memory (cognitive) problems. Allow foods to cool. Hot foods may be more difficult to swallow. Provide small meals more frequently instead of three large meals. This may reduce fatigue during eating. After the person has finishing eating: Check the person's  mouth thoroughly for leftover food. Keep the person sitting upright for 30-45 minutes. Do not serve food or drink within 2 hours of bedtime. General instructions Follow these general guidelines to prevent aspiration in someone who can eat and drink safely by mouth: Feed small amounts of food. Do not force-feed. For a  person who is on a diet for swallowing difficulty (dysphagia diet), follow the recommended food and drink consistency. For example, you may be told to thicken a liquid using a commercial food thickening product. Use as little water as possible when brushing the person's teeth or cleaning his or her mouth. Provide oral care before and after meals. Use adaptive devices, such as cut-out cups or other utensils, as told by the health care provider. Crush pills and put them in soft food such as pudding or ice cream. Some pills should not be crushed. Check with the health care provider before crushing any medicine. If someone is choking on food or an object, perform the Heimlich maneuver (abdominal thrusts).  Contact a health care provider if the person: Has a feeding tube, and the feeding tube residual amount is too high. Has a fever. Tries to avoid food or water, such as refusing to eat, drink, or be fed, or is eating less than normal. May have aspirated food or liquid. Is showing warning signs, such as choking or coughing, when he or she eats or drinks. Get help right away if the person: Has trouble breathing or starts to breathe quickly. Is breathing very slowly or stops breathing. Coughs a lot after eating or drinking. Has a long-lasting (chronic) cough. Coughs up thick, yellow, or tan phlegm. Has symptoms of pneumonia, such as: Coughing a lot. Coughing up mucus with a bad smell or blood in it. Feeling short of breath. Complaining of chest pain. Sweating, fever, and chills. Feeling tired. Complaining of trouble breathing. Wheezing. Cannot stop choking. Is unable to breathe, turns blue, faints, or seems confused. These symptoms may represent a serious problem that is an emergency. Do not wait to see if the symptoms will go away. Get medical help right away. Call your local emergency services (911 in the [place] Summary Aspiration is the breathing in (inhalation) of a liquid or material  into the lungs. Things that can be inhaled into the lungs include food, liquids, saliva, or stomach contents. Aspiration can cause pneumonia or choking. One sign of aspiration is coughing after swallowing food or liquids. Contact your health care provider if you notice signs of aspiration. This information is not intended to replace advice given to you by your health care provider. Make sure you discuss any questions you have with your healthcare provider. Document Revised: 09/18/2019 Document Reviewed: 01/15/2019 Elsevier Patient Education  2022 [place].    Dysphagia  Dysphagia is trouble swallowing. This condition occurs when solids and liquids stick in a person's throat on the way down to the stomach, or when food takeslonger to get to the stomach than usual. You may have problems swallowing food, liquids, or both. You may also have pain while trying to swallow. It may take you more time and effort to swallowsomething. What are the causes? This condition may be caused by: Muscle problems. These may make it difficult for you to move food and liquids through the esophagus, which is the tube that connects your mouth to your stomach. Blockages. You may have ulcers, scar tissue, or inflammation that blocks the normal passage of food and liquids. Causes of these problems include: Acid  reflux from your stomach into your esophagus (gastroesophageal reflux). Infections. Radiation treatment for cancer. Medicines taken without enough fluids to wash them down into your stomach. Stroke. This can affect the nerves and make it difficult to swallow. Nerve problems. These prevent signals from being sent to the muscles of your esophagus to squeeze (contract) and move what you swallow down to your stomach. Globus pharyngeus. This is a common problem that involves a feeling like something is stuck in your throat or a sense of trouble with swallowing, even though nothing is wrong with the swallowing  passages. Certain conditions, such as cerebral palsy or Parkinson's disease. What are the signs or symptoms? Common symptoms of this condition include: A feeling that solids or liquids are stuck in your throat on the way down to the stomach. Pain while swallowing. Coughing or gagging while trying to swallow. Other symptoms include: Food moving back from your stomach to your mouth (regurgitation). Noises coming from your throat. Chest discomfort when swallowing. A feeling of fullness when swallowing. Drooling, especially when the throat is blocked. Heartburn. How is this diagnosed? This condition may be diagnosed by: Barium swallow X-ray. In this test, you will swallow a white liquid that sticks to the inside of your esophagus. X-ray images are then taken. Endoscopy. In this test, a flexible telescope is inserted down your throat to look at your esophagus and your stomach. CT scans or an MRI. How is this treated? Treatment for dysphagia depends on the cause of this condition: If the dysphagia is caused by acid reflux or infection, medicines may be used. These may include antibiotics or heartburn medicines. If the dysphagia is caused by problems with the muscles, swallowing therapy may be used to help you strengthen your swallowing muscles. You may have to do specific exercises to strengthen the muscles or stretch them. If the dysphagia is caused by a blockage or mass, procedures to remove the blockage may be done. You may need surgery and a feeding tube. You may need to make diet changes. Ask your health care provider for specificinstructions. Follow these instructions at home: Medicines Take over-the-counter and prescription medicines only as told by your health care provider. If you were prescribed an antibiotic medicine, take it as told by your health care provider. Do not stop taking the antibiotic even if you start to feel better. Eating and drinking  Make any diet changes as told  by your health care provider. Work with a diet and nutrition specialist (dietitian) to create an eating plan that will help you get the nutrients you need in order to stay healthy. Eat soft foods that are easier to swallow. Cut your food into small pieces and eat slowly. Take small bites. Eat and drink only when you are sitting upright. Do not drink alcohol or caffeine. If you need help quitting, ask your health care provider.  General instructions Check your weight every day to make sure you are not losing weight. Do not use any products that contain nicotine or tobacco. These products include cigarettes, chewing tobacco, and vaping devices, such as e-cigarettes. If you need help quitting, ask your health care provider. Keep all follow-up visits. This is important. Contact a health care provider if: You lose weight because you cannot swallow. You cough when you drink liquids. You cough up partially digested food. Get help right away if: You cannot swallow your saliva. You have shortness of breath, a fever, or both. Your voice is hoarse and you have trouble swallowing. These  symptoms may represent a serious problem that is an emergency. Do not wait to see if the symptoms will go away. Get medical help right away. Call your local emergency services (911 in the [place] Do not drive yourself to the hospital. Summary Dysphagia is trouble swallowing. This condition occurs when solids and liquids stick in a person's throat on the way down to the stomach. You may cough or gag while trying to swallow. Dysphagia has many possible causes. Treatment for dysphagia depends on the cause of the condition. Keep all follow-up visits. This is important. This information is not intended to replace advice given to you by your health care provider. Make sure you discuss any questions you have with your healthcare provider. Document Revised: 09/01/2019 Document Reviewed: 09/01/2019 Elsevier Patient Education   [place].

## 2020-09-10 ENCOUNTER — Other Ambulatory Visit: Payer: Self-pay | Admitting: Physician Assistant

## 2020-09-15 ENCOUNTER — Other Ambulatory Visit: Payer: Self-pay | Admitting: Physician Assistant

## 2020-09-22 ENCOUNTER — Other Ambulatory Visit: Payer: Self-pay | Admitting: Physician Assistant

## 2020-09-30 ENCOUNTER — Other Ambulatory Visit: Payer: Self-pay

## 2020-09-30 ENCOUNTER — Encounter (INDEPENDENT_AMBULATORY_CARE_PROVIDER_SITE_OTHER): Payer: Self-pay | Admitting: Ophthalmology

## 2020-09-30 ENCOUNTER — Ambulatory Visit (INDEPENDENT_AMBULATORY_CARE_PROVIDER_SITE_OTHER): Payer: Medicare HMO | Admitting: Ophthalmology

## 2020-09-30 DIAGNOSIS — H353124 Nonexudative age-related macular degeneration, left eye, advanced atrophic with subfoveal involvement: Secondary | ICD-10-CM | POA: Diagnosis not present

## 2020-09-30 DIAGNOSIS — H353222 Exudative age-related macular degeneration, left eye, with inactive choroidal neovascularization: Secondary | ICD-10-CM | POA: Diagnosis not present

## 2020-09-30 DIAGNOSIS — H353211 Exudative age-related macular degeneration, right eye, with active choroidal neovascularization: Secondary | ICD-10-CM | POA: Diagnosis not present

## 2020-09-30 MED ORDER — BEVACIZUMAB 2.5 MG/0.1ML IZ SOSY
2.5000 mg | PREFILLED_SYRINGE | INTRAVITREAL | Status: AC | PRN
  Administered 2020-09-30: 2.5 mg via INTRAVITREAL

## 2020-09-30 NOTE — Assessment & Plan Note (Signed)
Intraretinal fluid and subretinal fluid resolution in 5 weeks suggested recent disease activity improved with therapy repeat injection today

## 2020-09-30 NOTE — Assessment & Plan Note (Signed)
Atrophy accounts for acuity 

## 2020-09-30 NOTE — Assessment & Plan Note (Signed)
No signs of recurrence 

## 2020-09-30 NOTE — Progress Notes (Signed)
09/30/2020     CHIEF COMPLAINT Patient presents for  Chief Complaint  Patient presents with   Retina Follow Up      HISTORY OF PRESENT ILLNESS: Donna Arias is a 87 y.o. female who presents to the clinic today for:   HPI     Retina Follow Up   Patient presents with  Wet AMD.  In right eye.  This started 6 weeks ago.  Severity is mild.  Duration of 6 weeks.  Since onset it is stable.        Comments   6 week fu OU and Avastin OD Pt states [facility] OU stable since last visit. Pt denies FOL, floaters, or ocular pain OU.        Last edited by Kendra Opitz, COA on 09/30/2020  9:57 AM.      Referring physician: Lorrene Reid, PA-C [address] [place],  [place] [address]  HISTORICAL INFORMATION:   Selected notes from the MEDICAL RECORD NUMBER    Lab Results  Component Value Date   HGBA1C 5.6 08/07/2019     CURRENT MEDICATIONS: Current Outpatient Medications (Ophthalmic Drugs)  Medication Sig   erythromycin ophthalmic ointment Place 1 application into the left eye at bedtime.   No current facility-administered medications for this visit. (Ophthalmic Drugs)   Current Outpatient Medications (Other)  Medication Sig   albuterol (PROVENTIL) (2.5 MG/3ML) 0.083% nebulizer solution Take 3 mLs (2.5 mg total) by nebulization every 6 (six) hours as needed for wheezing or shortness of breath.   amLODipine (NORVASC) 2.5 MG tablet TAKE 1 TABLET (2.5 MG TOTAL) BY MOUTH DAILY.   aspirin 81 MG tablet Take 81 mg by mouth daily.   atorvastatin (LIPITOR) 10 MG tablet TAKE 1 TABLET BY MOUTH DAILY.   benzonatate (TESSALON) 100 MG capsule Take 1 capsule (100 mg total) by mouth 3 (three) times daily as needed for cough.   budesonide (PULMICORT) 0.25 MG/2ML nebulizer solution Take 2 mLs (0.25 mg total) by nebulization in the morning and at bedtime.   Calcium Carbonate-Vit D-Min (CALCIUM 1200 PO) Take 1 tablet by mouth.   fexofenadine (ALLEGRA) 180 MG tablet Take 180 mg by  mouth daily.   FLUoxetine (PROZAC) 10 MG capsule TAKE 1 CAPSULE BY MOUTH DAILY.   fluticasone (FLONASE) 50 MCG/ACT nasal spray Place 1 spray into both nostrils 2 (two) times daily.   furosemide (LASIX) 20 MG tablet TAKE 1 TABLET BY MOUTH DAILY.   ketoconazole (NIZORAL) 2 % shampoo USE AS DIRECTED   levothyroxine (SYNTHROID) 25 MCG tablet TAKE 1/2 TABLET BY MOUTH DAILY WITH 50 MCG TO EQUAL 62.5 MCG TOTAL DAILY   levothyroxine (SYNTHROID) 50 MCG tablet TAKE 1 TABLET BY MOUTH EVERY DAY   potassium chloride SA (KLOR-CON) 20 MEQ tablet TAKE 1 TABLET BY MOUTH ONCE DAILY.   predniSONE (DELTASONE) 10 MG tablet 4tabx1d, 3tabx1d,2tab x1 1tabx1, 0.5x1d   risperiDONE (RISPERDAL) 1 MG tablet TAKE 1 TABLET BY MOUTH 2 TIMES DAILY.   STIOLTO RESPIMAT 2.5-2.5 MCG/ACT AERS INHALE 2 PUFFS INTO THE LUNGS DAILY.   triamcinolone cream (KENALOG) 0.1 % Apply 1 application topically 2 (two) times daily. Request jar   Current Facility-Administered Medications (Other)  Medication Route   ipratropium-albuterol (DUONEB) 0.5-2.5 (3) MG/3ML nebulizer solution 3 mL Nebulization      REVIEW OF SYSTEMS:    ALLERGIES Allergies  Allergen Reactions   Codeine Sulfate     REACTION: unspecified   Picato [Ingenol Mebutate] Hives    PAST  MEDICAL HISTORY Past Medical History:  Diagnosis Date   Chronic headaches    COPD (chronic obstructive pulmonary disease) (HCC)    Dementia (HCC)    Eye infection, left 01/16/2018   GERD (gastroesophageal reflux disease)    Heart attack ([place]) 1998   HLD (hyperlipidemia)    Hypertension    Skin cancer    squamous cell of left face   Stroke ([place])    Thyroid disease    Past Surgical History:  Procedure Laterality Date   BREAST LUMPECTOMY Left 1995   DILATION AND CURETTAGE OF UTERUS  1975   GALLBLADDER SURGERY  1993    FAMILY HISTORY Family History  Problem Relation Age of Onset   Hypertension Mother    Stroke Mother    Stroke Sister    Alzheimer's disease Sister     Stroke Sister    Hypertension Sister    Stroke Sister    Breast cancer Sister     SOCIAL HISTORY Social History   Tobacco Use   Smoking status: Former    Types: Cigarettes    Quit date: 1995    Years since quitting: 27.6   Smokeless tobacco: Never  Vaping Use   Vaping Use: Never used  Substance Use Topics   Alcohol use: No   Drug use: No         OPHTHALMIC EXAM:  Base Eye Exam     Visual Acuity (ETDRS)       Right Left   Dist cc 20/50 +1 CF at 3'   Dist ph cc NI NI    Correction: Glasses         Tonometry (Tonopen, 10:01 AM)       Right Left   Pressure 13 13         Pupils       Pupils Dark Light Shape React APD   Right PERRL 4 3 Round Brisk None   Left PERRL 4 3 Round Brisk None         Visual Fields (Counting fingers)       Left Right    Full Full         Extraocular Movement       Right Left    Full Full         Neuro/Psych     Oriented x3: Yes   Mood/Affect: Normal         Dilation     Both eyes: 1.0% Mydriacyl, 2.5% Phenylephrine @ 10:01 AM           Slit Lamp and Fundus Exam     External Exam       Right Left   External Normal Normal         Slit Lamp Exam       Right Left   Lids/Lashes Normal Normal   Conjunctiva/Sclera White and quiet White and quiet   Cornea Clear Clear   Anterior Chamber Deep and quiet Deep and quiet   Iris Round and reactive Round and reactive   Lens Centered posterior chamber intraocular lens Centered posterior chamber intraocular lens   Anterior Vitreous Normal Normal         Fundus Exam       Right Left   Posterior Vitreous Posterior vitreous detachment    Disc Peripapillary atrophy    C/D Ratio 0.65 0.6   Macula Retinal pigment epithelial mottling, no macular thickening, Retinal atrophy, Early age related macular degeneration, no serous retinal detachment visible clinically,  minor epiretinal membrane not distorting Advanced age related macular degeneration,  Geographic atrophy 1.5 - 2.0 disc areas foveal atrophy, no macular thickening, no hemorrhage   Vessels Normal    Periphery Normal             IMAGING AND PROCEDURES  Imaging and Procedures for 09/30/20  OCT, Retina - OU - Both Eyes       Right Eye Quality was good. Scan locations included subfoveal. Central Foveal Thickness: 281. Progression has improved. Findings include abnormal foveal contour.   Left Eye Quality was good. Scan locations included subfoveal. Central Foveal Thickness: 130. Progression has been stable. Findings include abnormal foveal contour, inner retinal atrophy, central retinal atrophy, outer retinal atrophy.   Notes Now with no subretinal fluid and no intraretinal fluid.  OD.  Vastly improved at 5-week interval.  OS with macular atrophy from previous retinal vascular disease.  Stable overall     Intravitreal Injection, Pharmacologic Agent - OD - Right Eye       Time Out 09/30/2020. 10:49 AM. Confirmed correct patient, procedure, site, and patient consented.   Anesthesia Topical anesthesia was used. Anesthetic medications included Akten 3.5%.   Procedure Preparation included Ofloxacin , Tobramycin 0.3%, 10% betadine to eyelids, 5% betadine to ocular surface. A 30 gauge needle was used.   Injection: 2.5 mg bevacizumab 2.5 MG/0.1ML   Route: Intravitreal, Site: Right Eye   NDC: [id number], Lot: [id number]   Post-op Post injection exam found visual acuity of at least counting fingers. The patient tolerated the procedure well. There were no complications. The patient received written and verbal post procedure care education. Post injection medications were not given.              ASSESSMENT/PLAN:  Exudative age-related macular degeneration of left eye with inactive choroidal neovascularization (HCC) No signs of recurrence  Advanced nonexudative age-related macular degeneration of left eye with subfoveal involvement Atrophy accounts for  acuity  Exudative age-related macular degeneration of right eye with active choroidal neovascularization (HCC) Intraretinal fluid and subretinal fluid resolution in 5 weeks suggested recent disease activity improved with therapy repeat injection today     ICD-10-CM   1. Exudative age-related macular degeneration of right eye with active choroidal neovascularization (HCC)  H35.3211 OCT, Retina - OU - Both Eyes    Intravitreal Injection, Pharmacologic Agent - OD - Right Eye    bevacizumab (AVASTIN) SOSY 2.5 mg    2. Exudative age-related macular degeneration of left eye with inactive choroidal neovascularization ([place])  H35.3222     3. Advanced nonexudative age-related macular degeneration of left eye with subfoveal involvement  H35.3124       1.  OS no sign of recurrence of CNVM with geographic atrophy limiting acuity, low risk for CNVM recurrence  2.  OD, with less subretinal fluid and intraretinal fluid resolution since last visit in 5 weeks. Recent disease activity, needs repeat injection today and examination next in 6 weeks 3.  Ophthalmic Meds Ordered this visit:  Meds ordered this encounter  Medications   bevacizumab (AVASTIN) SOSY 2.5 mg       Return in about 6 weeks (around 11/11/2020) for dilate, OD, AVASTIN OCT.  There are no Patient Instructions on file for this visit.   Explained the diagnoses, plan, and follow up with the patient and they expressed understanding.  Patient expressed understanding of the importance of proper follow up care.   Clent Demark Evelisse Szalkowski M.D. Diseases & Surgery of the Retina and Vitreous [facility] 09/30/20     Abbreviations: M myopia (nearsighted); A astigmatism; H hyperopia (farsighted); P presbyopia; Mrx spectacle prescription;  CTL contact lenses; OD right eye; OS left eye; OU both eyes  XT exotropia; ET esotropia; PEK punctate epithelial keratitis; PEE punctate epithelial erosions; DES dry eye syndrome; MGD meibomian  gland dysfunction; ATs artificial tears; PFAT's preservative free artificial tears; [place] nuclear sclerotic cataract; PSC posterior subcapsular cataract; ERM epi-retinal membrane; PVD posterior vitreous detachment; RD retinal detachment; DM diabetes mellitus; DR diabetic retinopathy; NPDR non-proliferative diabetic retinopathy; PDR proliferative diabetic retinopathy; CSME clinically significant macular edema; DME diabetic macular edema; dbh dot blot hemorrhages; CWS cotton wool spot; POAG primary open angle glaucoma; C/D cup-to-disc ratio; HVF humphrey visual field; GVF goldmann visual field; OCT optical coherence tomography; IOP intraocular pressure; BRVO Branch retinal vein occlusion; CRVO central retinal vein occlusion; CRAO central retinal artery occlusion; BRAO branch retinal artery occlusion; RT retinal tear; SB scleral buckle; PPV pars plana vitrectomy; VH Vitreous hemorrhage; PRP panretinal laser photocoagulation; IVK intravitreal kenalog; VMT vitreomacular traction; MH Macular hole;  NVD neovascularization of the disc; NVE neovascularization elsewhere; AREDS age related eye disease study; ARMD age related macular degeneration; POAG primary open angle glaucoma; EBMD epithelial/anterior basement membrane dystrophy; ACIOL anterior chamber intraocular lens; IOL intraocular lens; PCIOL posterior chamber intraocular lens; Phaco/IOL phacoemulsification with intraocular lens placement; PRK photorefractive keratectomy; LASIK laser assisted in situ keratomileusis; HTN hypertension; DM diabetes mellitus; COPD chronic obstructive pulmonary disease    09/30/2020     CHIEF COMPLAINT Patient presents for  Chief Complaint  Patient presents with   Retina Follow Up      HISTORY OF PRESENT ILLNESS: Donna Arias is a 87 y.o. female who presents to the clinic today for:   HPI     Retina Follow Up   Patient presents with  Wet AMD.  In right eye.  This started 6 weeks ago.  Severity is mild.  Duration of 6  weeks.  Since onset it is stable.        Comments   6 week fu OU and Avastin OD Pt states VA OU stable since last visit. Pt denies FOL, floaters, or ocular pain OU.        Last edited by Kendra Opitz, COA on 09/30/2020  9:57 AM.      Referring physician: Lorrene Reid, PA-C [place] [place],  [place] [address]  HISTORICAL INFORMATION:   Selected notes from the MEDICAL RECORD NUMBER    Lab Results  Component Value Date   HGBA1C 5.6 08/07/2019     CURRENT MEDICATIONS: Current Outpatient Medications (Ophthalmic Drugs)  Medication Sig   erythromycin ophthalmic ointment Place 1 application into the left eye at bedtime.   No current facility-administered medications for this visit. (Ophthalmic Drugs)   Current Outpatient Medications (Other)  Medication Sig   albuterol (PROVENTIL) (2.5 MG/3ML) 0.083% nebulizer solution Take 3 mLs (2.5 mg total) by nebulization every 6 (six) hours as needed for wheezing or shortness of breath.   amLODipine (NORVASC) 2.5 MG tablet TAKE 1 TABLET (2.5 MG TOTAL) BY MOUTH DAILY.   aspirin 81 MG tablet Take 81 mg by mouth daily.   atorvastatin (LIPITOR) 10 MG tablet TAKE 1 TABLET BY MOUTH DAILY.   benzonatate (TESSALON) 100 MG capsule Take 1 capsule (100 mg total) by mouth 3 (three) times daily as needed for cough.   budesonide (PULMICORT) 0.25 MG/2ML nebulizer solution Take 2 mLs (0.25 mg total) by nebulization in  the morning and at bedtime.   Calcium Carbonate-Vit D-Min (CALCIUM 1200 PO) Take 1 tablet by mouth.   fexofenadine (ALLEGRA) 180 MG tablet Take 180 mg by mouth daily.   FLUoxetine (PROZAC) 10 MG capsule TAKE 1 CAPSULE BY MOUTH DAILY.   fluticasone (FLONASE) 50 MCG/ACT nasal spray Place 1 spray into both nostrils 2 (two) times daily.   furosemide (LASIX) 20 MG tablet TAKE 1 TABLET BY MOUTH DAILY.   ketoconazole (NIZORAL) 2 % shampoo USE AS DIRECTED   levothyroxine (SYNTHROID) 25 MCG tablet TAKE 1/2 TABLET BY MOUTH DAILY  WITH 50 MCG TO EQUAL 62.5 MCG TOTAL DAILY   levothyroxine (SYNTHROID) 50 MCG tablet TAKE 1 TABLET BY MOUTH EVERY DAY   potassium chloride SA (KLOR-CON) 20 MEQ tablet TAKE 1 TABLET BY MOUTH ONCE DAILY.   predniSONE (DELTASONE) 10 MG tablet 4tabx1d, 3tabx1d,2tab x1 1tabx1, 0.5x1d   risperiDONE (RISPERDAL) 1 MG tablet TAKE 1 TABLET BY MOUTH 2 TIMES DAILY.   STIOLTO RESPIMAT 2.5-2.5 MCG/ACT AERS INHALE 2 PUFFS INTO THE LUNGS DAILY.   triamcinolone cream (KENALOG) 0.1 % Apply 1 application topically 2 (two) times daily. Request jar   Current Facility-Administered Medications (Other)  Medication Route   ipratropium-albuterol (DUONEB) 0.5-2.5 (3) MG/3ML nebulizer solution 3 mL Nebulization      REVIEW OF SYSTEMS:    ALLERGIES Allergies  Allergen Reactions   Codeine Sulfate     REACTION: unspecified   Picato [Ingenol Mebutate] Hives    PAST MEDICAL HISTORY Past Medical History:  Diagnosis Date   Chronic headaches    COPD (chronic obstructive pulmonary disease) (HCC)    Dementia (HCC)    Eye infection, left 01/16/2018   GERD (gastroesophageal reflux disease)    Heart attack ([place]) 1998   HLD (hyperlipidemia)    Hypertension    Skin cancer    squamous cell of left face   Stroke ([place])    Thyroid disease    Past Surgical History:  Procedure Laterality Date   BREAST LUMPECTOMY Left 1995   DILATION AND CURETTAGE OF UTERUS  1975   GALLBLADDER SURGERY  1993    FAMILY HISTORY Family History  Problem Relation Age of Onset   Hypertension Mother    Stroke Mother    Stroke Sister    Alzheimer's disease Sister    Stroke Sister    Hypertension Sister    Stroke Sister    Breast cancer Sister     SOCIAL HISTORY Social History   Tobacco Use   Smoking status: Former    Types: Cigarettes    Quit date: 1995    Years since quitting: 27.6   Smokeless tobacco: Never  Vaping Use   Vaping Use: Never used  Substance Use Topics   Alcohol use: No   Drug use: No          OPHTHALMIC EXAM:  Base Eye Exam     Visual Acuity (ETDRS)       Right Left   Dist cc 20/50 +1 CF at 3'   Dist ph cc NI NI    Correction: Glasses         Tonometry (Tonopen, 10:01 AM)       Right Left   Pressure 13 13         Pupils       Pupils Dark Light Shape React APD   Right PERRL 4 3 Round Brisk None   Left PERRL 4 3 Round Brisk None  Visual Fields (Counting fingers)       Left Right    Full Full         Extraocular Movement       Right Left    Full Full         Neuro/Psych     Oriented x3: Yes   Mood/Affect: Normal         Dilation     Both eyes: 1.0% Mydriacyl, 2.5% Phenylephrine @ 10:01 AM           Slit Lamp and Fundus Exam     External Exam       Right Left   External Normal Normal         Slit Lamp Exam       Right Left   Lids/Lashes Normal Normal   Conjunctiva/Sclera White and quiet White and quiet   Cornea Clear Clear   Anterior Chamber Deep and quiet Deep and quiet   Iris Round and reactive Round and reactive   Lens Centered posterior chamber intraocular lens Centered posterior chamber intraocular lens   Anterior Vitreous Normal Normal         Fundus Exam       Right Left   Posterior Vitreous Posterior vitreous detachment    Disc Peripapillary atrophy    C/D Ratio 0.65 0.6   Macula Retinal pigment epithelial mottling, no macular thickening, Retinal atrophy, Early age related macular degeneration, no serous retinal detachment visible clinically, minor epiretinal membrane not distorting Advanced age related macular degeneration, Geographic atrophy 1.5 - 2.0 disc areas foveal atrophy, no macular thickening, no hemorrhage   Vessels Normal    Periphery Normal             IMAGING AND PROCEDURES  Imaging and Procedures for 09/30/20  OCT, Retina - OU - Both Eyes       Right Eye Quality was good. Scan locations included subfoveal. Central Foveal Thickness: 281. Progression has improved.  Findings include abnormal foveal contour.   Left Eye Quality was good. Scan locations included subfoveal. Central Foveal Thickness: 130. Progression has been stable. Findings include abnormal foveal contour, inner retinal atrophy, central retinal atrophy, outer retinal atrophy.   Notes Now with no subretinal fluid and no intraretinal fluid.  OD.  Vastly improved at 5-week interval.  OS with macular atrophy from previous retinal vascular disease.  Stable overall     Intravitreal Injection, Pharmacologic Agent - OD - Right Eye       Time Out 09/30/2020. 10:49 AM. Confirmed correct patient, procedure, site, and patient consented.   Anesthesia Topical anesthesia was used. Anesthetic medications included Akten 3.5%.   Procedure Preparation included Ofloxacin , Tobramycin 0.3%, 10% betadine to eyelids, 5% betadine to ocular surface. A 30 gauge needle was used.   Injection: 2.5 mg bevacizumab 2.5 MG/0.1ML   Route: Intravitreal, Site: Right Eye   NDC: [phone], Lot: [id number]   Post-op Post injection exam found visual acuity of at least counting fingers. The patient tolerated the procedure well. There were no complications. The patient received written and verbal post procedure care education. Post injection medications were not given.              ASSESSMENT/PLAN:  Exudative age-related macular degeneration of left eye with inactive choroidal neovascularization (HCC) No signs of recurrence  Advanced nonexudative age-related macular degeneration of left eye with subfoveal involvement Atrophy accounts for acuity  Exudative age-related macular degeneration of right eye with active choroidal neovascularization (HCC) Intraretinal fluid  and subretinal fluid resolution in 5 weeks suggested recent disease activity improved with therapy repeat injection today     ICD-10-CM   1. Exudative age-related macular degeneration of right eye with active choroidal neovascularization (HCC)   H35.3211 OCT, Retina - OU - Both Eyes    Intravitreal Injection, Pharmacologic Agent - OD - Right Eye    bevacizumab (AVASTIN) SOSY 2.5 mg    2. Exudative age-related macular degeneration of left eye with inactive choroidal neovascularization ([place])  H35.3222     3. Advanced nonexudative age-related macular degeneration of left eye with subfoveal involvement  H35.3124       1.  2.  3.  Ophthalmic Meds Ordered this visit:  Meds ordered this encounter  Medications   bevacizumab (AVASTIN) SOSY 2.5 mg       Return in about 6 weeks (around 11/11/2020) for dilate, OD, AVASTIN OCT.  There are no Patient Instructions on file for this visit.   Explained the diagnoses, plan, and follow up with the patient and they expressed understanding.  Patient expressed understanding of the importance of proper follow up care.   Clent Demark Armandina Iman M.D. Diseases & Surgery of the Retina and Vitreous [facility] 09/30/20     Abbreviations: M myopia (nearsighted); A astigmatism; H hyperopia (farsighted); P presbyopia; Mrx spectacle prescription;  CTL contact lenses; OD right eye; OS left eye; OU both eyes  XT exotropia; ET esotropia; PEK punctate epithelial keratitis; PEE punctate epithelial erosions; DES dry eye syndrome; MGD meibomian gland dysfunction; ATs artificial tears; PFAT's preservative free artificial tears; [place] nuclear sclerotic cataract; PSC posterior subcapsular cataract; ERM epi-retinal membrane; PVD posterior vitreous detachment; RD retinal detachment; DM diabetes mellitus; DR diabetic retinopathy; NPDR non-proliferative diabetic retinopathy; PDR proliferative diabetic retinopathy; CSME clinically significant macular edema; DME diabetic macular edema; dbh dot blot hemorrhages; CWS cotton wool spot; POAG primary open angle glaucoma; C/D cup-to-disc ratio; HVF humphrey visual field; GVF goldmann visual field; OCT optical coherence tomography; IOP intraocular pressure; BRVO  Branch retinal vein occlusion; CRVO central retinal vein occlusion; CRAO central retinal artery occlusion; BRAO branch retinal artery occlusion; RT retinal tear; SB scleral buckle; PPV pars plana vitrectomy; VH Vitreous hemorrhage; PRP panretinal laser photocoagulation; IVK intravitreal kenalog; VMT vitreomacular traction; MH Macular hole;  NVD neovascularization of the disc; NVE neovascularization elsewhere; AREDS age related eye disease study; ARMD age related macular degeneration; POAG primary open angle glaucoma; EBMD epithelial/anterior basement membrane dystrophy; ACIOL anterior chamber intraocular lens; IOL intraocular lens; PCIOL posterior chamber intraocular lens; Phaco/IOL phacoemulsification with intraocular lens placement; [place] photorefractive keratectomy; LASIK laser assisted in situ keratomileusis; HTN hypertension; DM diabetes mellitus; COPD chronic obstructive pulmonary disease

## 2020-10-03 ENCOUNTER — Other Ambulatory Visit: Payer: Self-pay

## 2020-10-03 ENCOUNTER — Encounter: Payer: Self-pay | Admitting: Physician Assistant

## 2020-10-03 ENCOUNTER — Ambulatory Visit (INDEPENDENT_AMBULATORY_CARE_PROVIDER_SITE_OTHER): Payer: Medicare HMO | Admitting: Physician Assistant

## 2020-10-03 VITALS — BP 143/78 | HR 64 | Temp 97.9°F | Wt 148.1 lb

## 2020-10-03 DIAGNOSIS — N3091 Cystitis, unspecified with hematuria: Secondary | ICD-10-CM | POA: Diagnosis not present

## 2020-10-03 DIAGNOSIS — R350 Frequency of micturition: Secondary | ICD-10-CM

## 2020-10-03 LAB — POCT URINALYSIS DIPSTICK
Bilirubin, UA: NEGATIVE
Glucose, UA: NEGATIVE
Ketones, UA: NEGATIVE
Protein, UA: POSITIVE — AB
Spec Grav, UA: 1.02 (ref 1.010–1.025)
Urobilinogen, UA: 0.2 E.U./dL
pH, UA: 6 (ref 5.0–8.0)

## 2020-10-03 MED ORDER — KETOCONAZOLE 2 % EX SHAM: MEDICATED_SHAMPOO | CUTANEOUS | 1 refills | Status: DC

## 2020-10-03 MED ORDER — CIPROFLOXACIN HCL 500 MG PO TABS: 500.0000 mg | ORAL_TABLET | Freq: Two times a day (BID) | ORAL | 0 refills | Status: AC

## 2020-10-03 NOTE — Patient Instructions (Signed)

## 2020-10-03 NOTE — Progress Notes (Signed)
Acute Office Visit  Subjective:    Patient ID: Donna Arias, female    DOB: October 04, 1926, 86 y.o.   MRN: [id number]  Chief Complaint  Patient presents with   Urinary Tract Infection     HPI Patient is in today for c/o noticing blood in her urine and briefs yesterday. Does report urinary frequency and urine looks cloudy. Denies fever, abdominal or back pain, nausea, vomiting. Taking cranberry juice daily.      Past Medical History:  Diagnosis Date   Chronic headaches    COPD (chronic obstructive pulmonary disease) (HCC)    Dementia (HCC)    Eye infection, left 01/16/2018   GERD (gastroesophageal reflux disease)    Heart attack ([place]) 1998   HLD (hyperlipidemia)    Hypertension    Skin cancer    squamous cell of left face   Stroke ([place])    Thyroid disease     Past Surgical History:  Procedure Laterality Date   BREAST LUMPECTOMY Left 1995   DILATION AND CURETTAGE OF UTERUS  1975   [place]    Family History  Problem Relation Age of Onset   Hypertension Mother    Stroke Mother    Stroke Sister    Alzheimer's disease Sister    Stroke Sister    Hypertension Sister    Stroke Sister    Breast cancer Sister     Social History   Socioeconomic History   Marital status: Widowed    Spouse name: Not on file   Number of children: 1   Years of education: Not on file   Highest education level: Not on file  Occupational History   Occupation: RETIRED  Tobacco Use   Smoking status: Former    Types: Cigarettes    Quit date: 1995    Years since quitting: 27.7   Smokeless tobacco: Never  Vaping Use   Vaping Use: Never used  Substance and Sexual Activity   Alcohol use: No   Drug use: No   Sexual activity: Never    Birth control/protection: None  Other Topics Concern   Not on file  Social History Narrative   CB x 1   Lives at home with her daughter (since 02/05/2013)   Right handed   Regular exercise - NO   Social Determinants of Health    Financial Resource Strain: Not on file  Food Insecurity: Not on file  Transportation Needs: Not on file  Physical Activity: Not on file  Stress: Not on file  Social Connections: Not on file  Intimate Partner Violence: Not on file    Outpatient Medications Prior to Visit  Medication Sig Dispense Refill   albuterol (PROVENTIL) (2.5 MG/3ML) 0.083% nebulizer solution Take 3 mLs (2.5 mg total) by nebulization every 6 (six) hours as needed for wheezing or shortness of breath. 75 mL 6   amLODipine (NORVASC) 2.5 MG tablet TAKE 1 TABLET (2.5 MG TOTAL) BY MOUTH DAILY. 90 tablet 0   aspirin 81 MG tablet Take 81 mg by mouth daily.     atorvastatin (LIPITOR) 10 MG tablet TAKE 1 TABLET BY MOUTH DAILY. 90 tablet 0   benzonatate (TESSALON) 100 MG capsule Take 1 capsule (100 mg total) by mouth 3 (three) times daily as needed for cough. 30 capsule 1   budesonide (PULMICORT) 0.25 MG/2ML nebulizer solution Take 2 mLs (0.25 mg total) by nebulization in the morning and at bedtime. 120 mL 11   Calcium Carbonate-Vit D-Min (CALCIUM 1200 PO)  Take 1 tablet by mouth.     erythromycin ophthalmic ointment Place 1 application into the left eye at bedtime. 3.5 g 2   fexofenadine (ALLEGRA) 180 MG tablet Take 180 mg by mouth daily.     FLUoxetine (PROZAC) 10 MG capsule TAKE 1 CAPSULE BY MOUTH DAILY. 90 capsule 0   fluticasone (FLONASE) 50 MCG/ACT nasal spray Place 1 spray into both nostrils 2 (two) times daily. 16 g 6   furosemide (LASIX) 20 MG tablet TAKE 1 TABLET BY MOUTH DAILY. 90 tablet 0   levothyroxine (SYNTHROID) 25 MCG tablet TAKE 1/2 TABLET BY MOUTH DAILY WITH 50 MCG TO EQUAL 62.5 MCG TOTAL DAILY 45 tablet 0   levothyroxine (SYNTHROID) 50 MCG tablet TAKE 1 TABLET BY MOUTH EVERY DAY 90 tablet 0   potassium chloride SA (KLOR-CON) 20 MEQ tablet TAKE 1 TABLET BY MOUTH ONCE DAILY. 90 tablet 0   risperiDONE (RISPERDAL) 1 MG tablet TAKE 1 TABLET BY MOUTH 2 TIMES DAILY. 120 tablet 0   STIOLTO RESPIMAT 2.5-2.5 MCG/ACT  AERS INHALE 2 PUFFS INTO THE LUNGS DAILY. 4 g 2   triamcinolone cream (KENALOG) 0.1 % Apply 1 application topically 2 (two) times daily. Request jar 453 g 0   ketoconazole (NIZORAL) 2 % shampoo USE AS DIRECTED 120 mL 1   predniSONE (DELTASONE) 10 MG tablet 4tabx1d, 3tabx1d,2tab x1 1tabx1, 0.5x1d 11 tablet 0   Facility-Administered Medications Prior to Visit  Medication Dose Route Frequency Provider Last Rate Last Admin   ipratropium-albuterol (DUONEB) 0.5-2.5 (3) MG/3ML nebulizer solution 3 mL  3 mL Nebulization Q6H Danford, Katy D, NP   3 mL at 12/08/17 0958    Allergies  Allergen Reactions   Codeine Sulfate     REACTION: unspecified   Picato [Ingenol Mebutate] Hives    Review of Systems A fourteen system review of systems was performed and found to be positive as per HPI.     Objective:    Physical Exam General:  Well Developed, well nourished, in no acute distress Neuro:  Alert and oriented,  extra-ocular muscles intact  HEENT:  Normocephalic, atraumatic, neck supple, no carotid bruits appreciated  Skin:  no gross rash, warm, pink. Abdomen: Non-tender, non-distended, +BS, no guarding, no rebound tenderness, neg CVA tenderness  b/l Cardiac:  RRR, S1 S2 Respiratory:  Not using accessory muscles, speaking in full sentences- unlabored. Vascular:  Ext warm, no cyanosis apprec.; cap RF less 2 sec. Psych:  No HI/SI, judgement and insight good, Euthymic mood. Full Affect.  BP (!) 143/78   Pulse 64   Temp 97.9 F (36.6 C)   Wt 148 lb 1.6 oz (67.2 kg)   SpO2 97%   BMI 25.42 kg/m  Wt Readings from Last 3 Encounters:  10/03/20 148 lb 1.6 oz (67.2 kg)  09/04/20 150 lb 6.4 oz (68.2 kg)  06/12/20 148 lb 1.6 oz (67.2 kg)    Health Maintenance Due  Topic Date Due   TETANUS/TDAP  02/25/2018   COVID-19 Vaccine (4 - Booster for Pfizer series) 07/24/2019    There are no preventive care reminders to display for this patient.   Lab Results  Component Value Date   TSH 2.790  08/07/2019   Lab Results  Component Value Date   WBC 6.7 05/23/2020   HGB 14.1 05/23/2020   HCT 41.9 05/23/2020   MCV 93 05/23/2020   PLT 190 05/23/2020   Lab Results  Component Value Date   NA 145 (H) 05/23/2020   K 4.2 05/23/2020  CO2 22 05/23/2020   GLUCOSE 95 05/23/2020   BUN 20 05/23/2020   CREATININE 1.21 (H) 05/23/2020   BILITOT 0.5 05/23/2020   ALKPHOS 86 05/23/2020   AST 16 05/23/2020   ALT 13 05/23/2020   PROT 6.5 05/23/2020   ALBUMIN 3.9 05/23/2020   CALCIUM 10.0 05/23/2020   EGFR 42 (L) 05/23/2020   GFR 45.15 (L) 08/01/2015   Lab Results  Component Value Date   CHOL 156 05/23/2020   Lab Results  Component Value Date   HDL 61 05/23/2020   Lab Results  Component Value Date   LDLCALC 80 05/23/2020   Lab Results  Component Value Date   TRIG 76 05/23/2020   Lab Results  Component Value Date   CHOLHDL 2.6 05/23/2020   Lab Results  Component Value Date   HGBA1C 5.6 08/07/2019       Assessment & Plan:   Problem List Items Addressed This Visit   None Visit Diagnoses     Cystitis with hematuria    -  Primary   Relevant Medications   ciprofloxacin (CIPRO) 500 MG tablet   Other Relevant Orders   Urine Culture   POCT urinalysis dipstick (Completed)   Urinary frequency       Relevant Orders   Urine Culture   POCT urinalysis dipstick (Completed)      Cystitis with hematuria: -UA collected and positive for nitrites, large leukocytes and blood so will start treatment therapy for uncomplicated cystitis. No red flag s/s present concerning for pyelonephritis at this time. Pending urine culture results will adjust treatment plan if indicated. Continue cranberry juice and recommend to take a probiotic with antibiotic use.    Meds ordered this encounter  Medications   ciprofloxacin (CIPRO) 500 MG tablet    Sig: Take 1 tablet (500 mg total) by mouth 2 (two) times daily for 3 days.    Dispense:  6 tablet    Refill:  0    Order Specific Question:    Supervising Provider    Answer:   Beatrice Lecher D [[id number]]   ketoconazole (NIZORAL) 2 % shampoo    Sig: Apply topically 2 (two) times a week.    Dispense:  120 mL    Refill:  1    Order Specific Question:   Supervising Provider    Answer:   Beatrice Lecher D [[id number]]      Lorrene Reid, PA-C

## 2020-10-07 LAB — URINE CULTURE

## 2020-10-08 ENCOUNTER — Other Ambulatory Visit: Payer: Self-pay | Admitting: Physician Assistant

## 2020-10-12 NOTE — Assessment & Plan Note (Addendum)
-   Recently treated for AECOPD with prednisone taper and Keflex. Symptoms have improved significantly. She still has a congested cough and difficulty expectorating mucus. Advised she start regular mucinex '600mg'$  BID as needed for congestion. Continue Stiolto Respimat, Pulmicort nebulizer BID.

## 2020-10-12 NOTE — Assessment & Plan Note (Signed)
-   Continue Nascort nasal spray - Start ocean/saline nasal spray twice a day

## 2020-10-14 ENCOUNTER — Other Ambulatory Visit: Payer: Self-pay | Admitting: Physician Assistant

## 2020-10-14 DIAGNOSIS — E039 Hypothyroidism, unspecified: Secondary | ICD-10-CM

## 2020-10-20 ENCOUNTER — Other Ambulatory Visit: Payer: Self-pay | Admitting: Physician Assistant

## 2020-10-20 DIAGNOSIS — J449 Chronic obstructive pulmonary disease, unspecified: Secondary | ICD-10-CM

## 2020-11-05 ENCOUNTER — Other Ambulatory Visit (INDEPENDENT_AMBULATORY_CARE_PROVIDER_SITE_OTHER): Payer: Medicare HMO

## 2020-11-05 DIAGNOSIS — N3091 Cystitis, unspecified with hematuria: Secondary | ICD-10-CM

## 2020-11-05 LAB — POCT URINALYSIS DIPSTICK
Bilirubin, UA: NEGATIVE
Blood, UA: NEGATIVE
Glucose, UA: NEGATIVE
Ketones, UA: NEGATIVE
Nitrite, UA: NEGATIVE
Protein, UA: NEGATIVE
Spec Grav, UA: 1.015 (ref 1.010–1.025)
Urobilinogen, UA: 0.2 E.U./dL
pH, UA: 7 (ref 5.0–8.0)

## 2020-11-07 ENCOUNTER — Ambulatory Visit (INDEPENDENT_AMBULATORY_CARE_PROVIDER_SITE_OTHER): Payer: Medicare HMO | Admitting: Nurse Practitioner

## 2020-11-07 ENCOUNTER — Other Ambulatory Visit: Payer: Self-pay

## 2020-11-07 VITALS — BP 116/77 | HR 72 | Temp 98.2°F | Wt 148.0 lb

## 2020-11-07 DIAGNOSIS — Z23 Encounter for immunization: Secondary | ICD-10-CM

## 2020-11-07 NOTE — Progress Notes (Signed)
HPI - Patient is here for her annual influenza vaccine. Denies any allergy to latex or eggs. Denies having an adverse reaction to a flu vaccine in the past.   Assessment and Plan - Patient tolerated injection well.

## 2020-11-11 ENCOUNTER — Ambulatory Visit (INDEPENDENT_AMBULATORY_CARE_PROVIDER_SITE_OTHER): Payer: Medicare HMO | Admitting: Ophthalmology

## 2020-11-11 ENCOUNTER — Encounter (INDEPENDENT_AMBULATORY_CARE_PROVIDER_SITE_OTHER): Payer: Self-pay | Admitting: Ophthalmology

## 2020-11-11 ENCOUNTER — Other Ambulatory Visit: Payer: Self-pay

## 2020-11-11 DIAGNOSIS — H353222 Exudative age-related macular degeneration, left eye, with inactive choroidal neovascularization: Secondary | ICD-10-CM | POA: Diagnosis not present

## 2020-11-11 DIAGNOSIS — H353211 Exudative age-related macular degeneration, right eye, with active choroidal neovascularization: Secondary | ICD-10-CM | POA: Diagnosis not present

## 2020-11-11 MED ORDER — BEVACIZUMAB 2.5 MG/0.1ML IZ SOSY
2.5000 mg | PREFILLED_SYRINGE | INTRAVITREAL | Status: AC | PRN
  Administered 2020-11-11: 2.5 mg via INTRAVITREAL

## 2020-11-11 NOTE — Progress Notes (Signed)
11/11/2020     CHIEF COMPLAINT Patient presents for  Chief Complaint  Patient presents with   Retina Follow Up      HISTORY OF PRESENT ILLNESS: Donna Arias is a 87 y.o. female who presents to the clinic today for:   HPI     Retina Follow Up   Patient presents with  Wet AMD.  In right eye.  This started 6 weeks ago.  Severity is mild.  Duration of 6 weeks.  Since onset it is stable.        Comments   6 week fu OD oct avastin OD. Patient states vision is stable and unchanged since last visit. Denies any new floaters or FOL.       Last edited by Laurin Coder on 11/11/2020  9:39 AM.      Referring physician: Lorrene Reid, PA-C [place],  [place] [id number]  HISTORICAL INFORMATION:   Selected notes from the MEDICAL RECORD NUMBER    Lab Results  Component Value Date   HGBA1C 5.6 08/07/2019     CURRENT MEDICATIONS: Current Outpatient Medications (Ophthalmic Drugs)  Medication Sig   erythromycin ophthalmic ointment Place 1 application into the left eye at bedtime.   No current facility-administered medications for this visit. (Ophthalmic Drugs)   Current Outpatient Medications (Other)  Medication Sig   albuterol (PROVENTIL) (2.5 MG/3ML) 0.083% nebulizer solution Take 3 mLs (2.5 mg total) by nebulization every 6 (six) hours as needed for wheezing or shortness of breath.   amLODipine (NORVASC) 2.5 MG tablet TAKE 1 TABLET (2.5 MG TOTAL) BY MOUTH DAILY.   aspirin 81 MG tablet Take 81 mg by mouth daily.   atorvastatin (LIPITOR) 10 MG tablet TAKE 1 TABLET BY MOUTH DAILY.   benzonatate (TESSALON) 100 MG capsule Take 1 capsule (100 mg total) by mouth 3 (three) times daily as needed for cough.   budesonide (PULMICORT) 0.25 MG/2ML nebulizer solution Take 2 mLs (0.25 mg total) by nebulization in the morning and at bedtime.   Calcium Carbonate-Vit D-Min (CALCIUM 1200 PO) Take 1 tablet by mouth.   fexofenadine (ALLEGRA) 180 MG tablet Take  180 mg by mouth daily.   FLUoxetine (PROZAC) 10 MG capsule TAKE 1 CAPSULE BY MOUTH DAILY.   fluticasone (FLONASE) 50 MCG/ACT nasal spray Place 1 spray into both nostrils 2 (two) times daily.   furosemide (LASIX) 20 MG tablet TAKE 1 TABLET BY MOUTH DAILY.   ketoconazole (NIZORAL) 2 % shampoo Apply topically 2 (two) times a week.   levothyroxine (SYNTHROID) 25 MCG tablet TAKE 1/2 TABLET BY MOUTH DAILY WITH 50 MCG TO EQUAL 62.5 MCG TOTAL DAILY   levothyroxine (SYNTHROID) 50 MCG tablet TAKE 1 TABLET BY MOUTH EVERY DAY   potassium chloride SA (KLOR-CON) 20 MEQ tablet TAKE 1 TABLET BY MOUTH ONCE DAILY.   risperiDONE (RISPERDAL) 1 MG tablet TAKE 1 TABLET BY MOUTH 2 TIMES DAILY.   STIOLTO RESPIMAT 2.5-2.5 MCG/ACT AERS INHALE 2 PUFFS INTO THE LUNGS DAILY.   triamcinolone cream (KENALOG) 0.1 % Apply 1 application topically 2 (two) times daily. Request jar   Current Facility-Administered Medications (Other)  Medication Route   ipratropium-albuterol (DUONEB) 0.5-2.5 (3) MG/3ML nebulizer solution 3 mL Nebulization      REVIEW OF SYSTEMS:    ALLERGIES Allergies  Allergen Reactions   Codeine Sulfate     REACTION: unspecified   Picato [Ingenol Mebutate] Hives    PAST MEDICAL HISTORY Past Medical History:  Diagnosis Date  Chronic headaches    COPD (chronic obstructive pulmonary disease) (HCC)    Dementia (HCC)    Eye infection, left 01/16/2018   GERD (gastroesophageal reflux disease)    Heart attack ([place]) 1998   HLD (hyperlipidemia)    Hypertension    Skin cancer    squamous cell of left face   Stroke ([place])    Thyroid disease    Past Surgical History:  Procedure Laterality Date   BREAST LUMPECTOMY Left 1995   DILATION AND CURETTAGE OF UTERUS  1975   GALLBLADDER SURGERY  1993    FAMILY HISTORY Family History  Problem Relation Age of Onset   Hypertension Mother    Stroke Mother    Stroke Sister    Alzheimer's disease Sister    Stroke Sister    Hypertension Sister     Stroke Sister    Breast cancer Sister     SOCIAL HISTORY Social History   Tobacco Use   Smoking status: Former    Types: Cigarettes    Quit date: 1995    Years since quitting: 27.8   Smokeless tobacco: Never  Vaping Use   Vaping Use: Never used  Substance Use Topics   Alcohol use: No   Drug use: No         OPHTHALMIC EXAM:  Base Eye Exam     Visual Acuity (ETDRS)       Right Left   Dist cc 20/50 -1 CF at 3'   Dist ph cc NI NI    Correction: Glasses         Tonometry (Tonopen, 9:37 AM)       Right Left   Pressure 11 10         Pupils       Pupils Dark Light Shape React APD   Right PERRL 4 3 Round Brisk None   Left PERRL 4 3 Round Brisk +1         Extraocular Movement       Right Left    Full Full         Neuro/Psych     Oriented x3: Yes   Mood/Affect: Normal         Dilation     Both eyes: 1.0% Mydriacyl, 2.5% Phenylephrine @ 9:37 AM           Slit Lamp and Fundus Exam     External Exam       Right Left   External Normal Normal         Slit Lamp Exam       Right Left   Lids/Lashes Normal Normal   Conjunctiva/Sclera White and quiet White and quiet   Cornea Clear Clear   Anterior Chamber Deep and quiet Deep and quiet   Iris Round and reactive Round and reactive   Lens Centered posterior chamber intraocular lens Centered posterior chamber intraocular lens   Anterior Vitreous Normal Normal         Fundus Exam       Right Left   Posterior Vitreous Posterior vitreous detachment    Disc Peripapillary atrophy    C/D Ratio 0.65 0.6   Macula Retinal pigment epithelial mottling, no macular thickening, Retinal atrophy, Early age related macular degeneration, no serous retinal detachment visible clinically, minor epiretinal membrane not distorting Advanced age related macular degeneration, Geographic atrophy 1.5 - 2.0 disc areas foveal atrophy, no macular thickening, no hemorrhage   Vessels Normal    Periphery Normal  IMAGING AND PROCEDURES  Imaging and Procedures for 11/11/20  Intravitreal Injection, Pharmacologic Agent - OD - Right Eye       Time Out 11/11/2020. 9:54 AM. Confirmed correct patient, procedure, site, and patient consented.   Anesthesia Topical anesthesia was used. Anesthetic medications included [place] 3.5%.   Procedure Preparation included Ofloxacin , Tobramycin 0.3%, 10% betadine to eyelids, 5% betadine to ocular surface. A 30 gauge needle was used.   Injection: 2.5 mg bevacizumab 2.5 MG/0.1ML   Route: Intravitreal, Site: Right Eye   NDC: [phone], Lot: [id number]   Post-op Post injection exam found visual acuity of at least counting fingers. The patient tolerated the procedure well. There were no complications. The patient received written and verbal post procedure care education. Post injection medications included ocuflox.      OCT, Retina - OU - Both Eyes       Right Eye Quality was good. Scan locations included subfoveal. Central Foveal Thickness: 282. Progression has improved. Findings include abnormal foveal contour.   Left Eye Quality was good. Scan locations included nasal. Central Foveal Thickness: 130. Progression has been stable. Findings include abnormal foveal contour, inner retinal atrophy, central retinal atrophy, outer retinal atrophy.   Notes Now with no subretinal fluid and no intraretinal fluid.  OD.  Vastly improved at 6-week interval.  Repeat injection today and follow-up next in 7 weeks  OS with macular atrophy from previous retinal vascular disease.  Stable overall             ASSESSMENT/PLAN:  Exudative age-related macular degeneration of right eye with active choroidal neovascularization ([place]) OD vastly improved post injection of Avastin currently at 6-week follow-up interval looks great today.  Subfoveal nodule of debris remains unchanged.  Now nearly 1 year after onset of therapy reactivated disease compared to  November 2021  Repeat injection today Avastin follow-up next OD in 7 weeks  Exudative age-related macular degeneration of left eye with inactive choroidal neovascularization ([place]) No signs of reactivation of CNVM by OCT OS     ICD-10-CM   1. Exudative age-related macular degeneration of right eye with active choroidal neovascularization (HCC)  H35.3211 Intravitreal Injection, Pharmacologic Agent - OD - Right Eye    OCT, Retina - OU - Both Eyes    bevacizumab (AVASTIN) SOSY 2.5 mg    2. Exudative age-related macular degeneration of left eye with inactive choroidal neovascularization ([place])  H35.3222       1.  Monocular patient with improved and stabilize acuity OD on therapy.  Activation of CNVM from November 2021 vastly improved with less intraretinal and subretinal fluid on therapy.  Today follow-up at 6-week interval still resolved and quiescent disease.  Repeat injection today Avastin examination next in 7 weeks OD  2.  Dilate OU next, likely injection Avastin OD  3.  Ophthalmic Meds Ordered this visit:  Meds ordered this encounter  Medications   bevacizumab (AVASTIN) SOSY 2.5 mg       Return in about 7 weeks (around 12/30/2020) for DILATE OU, AVASTIN OCT, OD.  There are no Patient Instructions on file for this visit.   Explained the diagnoses, plan, and follow up with the patient and they expressed understanding.  Patient expressed understanding of the importance of proper follow up care.   Clent Demark Tatum Massman M.D. Diseases & Surgery of the Retina and Vitreous Retina & Diabetic [place] 11/11/20     Abbreviations: M myopia (nearsighted); A astigmatism; H hyperopia (farsighted); P presbyopia; Mrx spectacle prescription;  CTL  contact lenses; OD right eye; OS left eye; OU both eyes  XT exotropia; ET esotropia; PEK punctate epithelial keratitis; PEE punctate epithelial erosions; DES dry eye syndrome; MGD meibomian gland dysfunction; ATs artificial tears; PFAT's preservative  free artificial tears; [place] nuclear sclerotic cataract; PSC posterior subcapsular cataract; ERM epi-retinal membrane; PVD posterior vitreous detachment; RD retinal detachment; DM diabetes mellitus; DR diabetic retinopathy; NPDR non-proliferative diabetic retinopathy; PDR proliferative diabetic retinopathy; CSME clinically significant macular edema; DME diabetic macular edema; dbh dot blot hemorrhages; CWS cotton wool spot; POAG primary open angle glaucoma; C/D cup-to-disc ratio; HVF humphrey visual field; GVF goldmann visual field; OCT optical coherence tomography; IOP intraocular pressure; BRVO Branch retinal vein occlusion; CRVO central retinal vein occlusion; CRAO central retinal artery occlusion; BRAO branch retinal artery occlusion; RT retinal tear; SB scleral buckle; PPV pars plana vitrectomy; VH Vitreous hemorrhage; PRP panretinal laser photocoagulation; IVK intravitreal kenalog; VMT vitreomacular traction; MH Macular hole;  NVD neovascularization of the disc; NVE neovascularization elsewhere; AREDS age related eye disease study; ARMD age related macular degeneration; POAG primary open angle glaucoma; EBMD epithelial/anterior basement membrane dystrophy; ACIOL anterior chamber intraocular lens; IOL intraocular lens; PCIOL posterior chamber intraocular lens; Phaco/IOL phacoemulsification with intraocular lens placement; [place] photorefractive keratectomy; LASIK laser assisted in situ keratomileusis; HTN hypertension; DM diabetes mellitus; COPD chronic obstructive pulmonary disease

## 2020-11-11 NOTE — Assessment & Plan Note (Signed)
No signs of reactivation of CNVM by OCT OS

## 2020-11-11 NOTE — Assessment & Plan Note (Signed)
OD vastly improved post injection of Avastin currently at 6-week follow-up interval looks great today.  Subfoveal nodule of debris remains unchanged.  Now nearly 1 year after onset of therapy reactivated disease compared to November 2021  Repeat injection today Avastin follow-up next OD in 7 weeks

## 2020-11-14 ENCOUNTER — Other Ambulatory Visit: Payer: Self-pay

## 2020-11-14 DIAGNOSIS — N3091 Cystitis, unspecified with hematuria: Secondary | ICD-10-CM

## 2020-11-14 LAB — URINE CULTURE

## 2020-11-14 MED ORDER — CIPROFLOXACIN HCL 250 MG PO TABS: 250.0000 mg | ORAL_TABLET | Freq: Two times a day (BID) | ORAL | 0 refills | Status: AC

## 2020-11-17 ENCOUNTER — Other Ambulatory Visit: Payer: Self-pay | Admitting: Physician Assistant

## 2020-11-17 DIAGNOSIS — Z760 Encounter for issue of repeat prescription: Secondary | ICD-10-CM

## 2020-11-28 ENCOUNTER — Encounter: Payer: Self-pay | Admitting: Physician Assistant

## 2020-11-28 ENCOUNTER — Other Ambulatory Visit: Payer: Self-pay

## 2020-11-28 ENCOUNTER — Ambulatory Visit (INDEPENDENT_AMBULATORY_CARE_PROVIDER_SITE_OTHER): Payer: Medicare HMO | Admitting: Physician Assistant

## 2020-11-28 VITALS — BP 113/66 | HR 62 | Temp 97.5°F | Ht 64.0 in | Wt 148.0 lb

## 2020-11-28 DIAGNOSIS — N1832 Chronic kidney disease, stage 3b: Secondary | ICD-10-CM

## 2020-11-28 DIAGNOSIS — I10 Essential (primary) hypertension: Secondary | ICD-10-CM

## 2020-11-28 DIAGNOSIS — E039 Hypothyroidism, unspecified: Secondary | ICD-10-CM | POA: Diagnosis not present

## 2020-11-28 DIAGNOSIS — Z0000 Encounter for general adult medical examination without abnormal findings: Secondary | ICD-10-CM | POA: Diagnosis not present

## 2020-11-28 DIAGNOSIS — E785 Hyperlipidemia, unspecified: Secondary | ICD-10-CM

## 2020-11-28 DIAGNOSIS — R7303 Prediabetes: Secondary | ICD-10-CM

## 2020-11-28 NOTE — Progress Notes (Signed)
Subjective:   Donna Arias is a 87 y.o. female who presents for Medicare Annual (Subsequent) preventive examination.  Review of Systems    General:   No F/C, wt loss Pulm:   No DIB, SOB, pleuritic chest pain Card:  No CP, palpitations Abd:  No n/v/d or pain Ext:  No inc edema from baseline    Objective:    Today's Vitals   11/28/20 0948  BP: 113/66  Pulse: 62  Temp: (!) 97.5 F (36.4 C)  SpO2: 95%  Weight: 148 lb (67.1 kg)  Height: 5\' 4"  (1.626 m)   Body mass index is 25.4 kg/m.  Advanced Directives 05/26/2020 05/03/2016 04/14/2016  Does Patient Have a Medical Advance Directive? Yes Yes Yes  Type of Paramedic of [place];Living will [place];Living will [place];Living will    Current Medications (verified) Outpatient Encounter Medications as of 11/28/2020  Medication Sig   albuterol (PROVENTIL) (2.5 MG/3ML) 0.083% nebulizer solution Take 3 mLs (2.5 mg total) by nebulization every 6 (six) hours as needed for wheezing or shortness of breath.   amLODipine (NORVASC) 2.5 MG tablet TAKE 1 TABLET (2.5 MG TOTAL) BY MOUTH DAILY.   aspirin 81 MG tablet Take 81 mg by mouth daily.   atorvastatin (LIPITOR) 10 MG tablet TAKE 1 TABLET BY MOUTH DAILY.   benzonatate (TESSALON) 100 MG capsule Take 1 capsule (100 mg total) by mouth 3 (three) times daily as needed for cough.   budesonide (PULMICORT) 0.25 MG/2ML nebulizer solution Take 2 mLs (0.25 mg total) by nebulization in the morning and at bedtime.   Calcium Carbonate-Vit D-Min (CALCIUM 1200 PO) Take 1 tablet by mouth.   erythromycin ophthalmic ointment Place 1 application into the left eye at bedtime.   fexofenadine (ALLEGRA) 180 MG tablet Take 180 mg by mouth daily.   FLUoxetine (PROZAC) 10 MG capsule TAKE 1 CAPSULE BY MOUTH DAILY.   fluticasone (FLONASE) 50 MCG/ACT nasal spray Place 1 spray into both nostrils 2 (two) times daily.   furosemide (LASIX) 20 MG tablet TAKE 1  TABLET BY MOUTH DAILY.   ketoconazole (NIZORAL) 2 % shampoo Apply topically 2 (two) times a week.   levothyroxine (SYNTHROID) 25 MCG tablet TAKE 1/2 TABLET BY MOUTH DAILY WITH 50 MCG TO EQUAL 62.5 MCG TOTAL DAILY   levothyroxine (SYNTHROID) 50 MCG tablet TAKE 1 TABLET BY MOUTH EVERY DAY   potassium chloride SA (KLOR-CON) 20 MEQ tablet TAKE 1 TABLET BY MOUTH ONCE DAILY.   risperiDONE (RISPERDAL) 1 MG tablet TAKE 1 TABLET BY MOUTH 2 TIMES DAILY.   STIOLTO RESPIMAT 2.5-2.5 MCG/ACT AERS INHALE 2 PUFFS INTO THE LUNGS DAILY.   triamcinolone cream (KENALOG) 0.1 % Apply 1 application topically 2 (two) times daily. Request jar   Facility-Administered Encounter Medications as of 11/28/2020  Medication   ipratropium-albuterol (DUONEB) 0.5-2.5 (3) MG/3ML nebulizer solution 3 mL    Allergies (verified) Codeine sulfate and Picato [ingenol mebutate]   History: Past Medical History:  Diagnosis Date   Chronic headaches    COPD (chronic obstructive pulmonary disease) (HCC)    Dementia ([place])    Eye infection, left 01/16/2018   GERD (gastroesophageal reflux disease)    Heart attack ([place]) 1998   HLD (hyperlipidemia)    Hypertension    Skin cancer    squamous cell of left face   Stroke [facility])    Thyroid disease    Past Surgical History:  Procedure Laterality Date   BREAST LUMPECTOMY Left 1995  DILATION AND CURETTAGE OF UTERUS  1975   GALLBLADDER SURGERY  1993   Family History  Problem Relation Age of Onset   Hypertension Mother    Stroke Mother    Stroke Sister    Alzheimer's disease Sister    Stroke Sister    Hypertension Sister    Stroke Sister    Breast cancer Sister    Social History   Socioeconomic History   Marital status: Widowed    Spouse name: Not on file   Number of children: 1   Years of education: Not on file   Highest education level: Not on file  Occupational History   Occupation: RETIRED  Tobacco Use   Smoking status: Former    Types: Cigarettes    Quit date:  1995    Years since quitting: 27.8   Smokeless tobacco: Never  Vaping Use   Vaping Use: Never used  Substance and Sexual Activity   Alcohol use: No   Drug use: No   Sexual activity: Never    Birth control/protection: None  Other Topics Concern   Not on file  Social History Narrative   CB x 1   Lives at home with her daughter (since 02/05/2013)   Right handed   Regular exercise - NO   Social Determinants of Health   Financial Resource Strain: Not on file  Food Insecurity: Not on file  Transportation Needs: Not on file  Physical Activity: Not on file  Stress: Not on file  Social Connections: Not on file    Tobacco Counseling Counseling given: Not Answered     Diabetic? no    Activities of Daily Living In your present state of health, do you have any difficulty performing the following activities: 11/28/2020 10/03/2020  Hearing? Tempie Donning  Vision? N Y  Difficulty concentrating or making decisions? N N  Walking or climbing stairs? Y Y  Dressing or bathing? Y Y  Doing errands, shopping? Y Y  Some recent data might be hidden    Patient Care Team: Lorrene Reid, PA-C as PCP - General (Physician Assistant)  Indicate any recent Medical Services you may have received from other than [facility] providers in the past year (date may be approximate).     Assessment:   This is a routine wellness examination for Donna Arias.  Hearing/Vision screen No results found.  Dietary issues and exercise activities discussed: -Soft diet, stays active with doing light activities at the house.   Goals Addressed   None   Depression Screen PHQ 2/9 Scores 11/28/2020 10/03/2020 06/12/2020 05/27/2020 04/15/2020 01/09/2020 11/12/2019  PHQ - 2 Score 0 0 0 0 0 0 -  PHQ- 9 Score 1 0 0 0 3 3 -  Exception Documentation - - - - - - Patient refusal    Fall Risk Fall Risk  11/28/2020 10/03/2020 06/12/2020 05/27/2020 04/15/2020  Falls in the past year? 0 0 0 0 0  Number falls in past yr: 0 0 0 0 -  Injury with  Fall? 0 0 0 0 -  Risk for fall due to : - No Fall Risks - - -  Follow up Falls evaluation completed Falls evaluation completed Falls evaluation completed Falls evaluation completed Falls evaluation completed    [place]:  Any stairs in or around the home? Yes  If so, are there any without handrails? No  Home free of loose throw rugs in walkways, pet beds, electrical cords, etc? Yes  Adequate lighting  in your home to reduce risk of falls? Yes   ASSISTIVE DEVICES UTILIZED TO PREVENT FALLS:  Life alert? No  Use of a cane, walker or w/c? Yes  Grab bars in the bathroom? Yes  Shower chair or bench in shower? Yes  Elevated toilet seat or a handicapped toilet? Yes   TIMED UP AND GO:  Was the test performed? Yes .  Length of time to ambulate 10 feet: 10 sec.   Gait steady and fast without use of assistive device  Cognitive Function: stable     6CIT Screen 11/28/2020 08/23/2016  What Year? 0 points 0 points  What month? 0 points 0 points  What time? 3 points 0 points  Count back from 20 2 points 4 points  Months in reverse 2 points 4 points  Repeat phrase 4 points 10 points  Total Score 11 18    Immunizations Immunization History  Administered Date(s) Administered   Fluad Quad(high Dose 65+) 11/06/2018, 12/11/2019, 11/07/2020   H1N1 02/26/2008   Influenza Split 12/02/2010, 10/07/2011   Influenza Whole 11/17/2006, 10/27/2007, 12/01/2009   Influenza, High Dose Seasonal PF 10/15/2014, 11/10/2015, 10/25/2016, 10/27/2017   Influenza,inj,Quad PF,6+ Mos 11/10/2012, 09/18/2013   PFIZER(Purple Top)SARS-COV-2 Vaccination 01/30/2019, 04/01/2019, 05/01/2019   Pneumococcal Conjugate-13 04/30/2013   Pneumococcal Polysaccharide-23 01/26/2003   Td 01/25/1997, 02/26/2008   Zoster Recombinat (Shingrix) 11/30/2019, 01/31/2020   Zoster, Live 04/08/2008    TDAP status: Due, Education has been provided regarding the importance of this vaccine. Advised may  receive this vaccine at local pharmacy or Health Dept. Aware to provide a copy of the vaccination record if obtained from local pharmacy or Health Dept. Verbalized acceptance and understanding.  Flu Vaccine status: Up to date  Pneumococcal vaccine status: Up to date  Covid-19 vaccine status: Completed vaccines  Qualifies for Shingles Vaccine? Yes   Zostavax completed Yes   Shingrix Completed?: Yes  Screening Tests Health Maintenance  Topic Date Due   TETANUS/TDAP  02/25/2018   COVID-19 Vaccine (4 - Booster for Pfizer series) 06/26/2019   Pneumonia Vaccine 69+ Years old  Completed   INFLUENZA VACCINE  Completed   DEXA SCAN  Completed   Zoster Vaccines- Shingrix  Completed   HPV VACCINES  Aged Out    Health Maintenance  Health Maintenance Due  Topic Date Due   TETANUS/TDAP  02/25/2018   COVID-19 Vaccine (4 - Booster for Pfizer series) 06/26/2019    Colorectal cancer screening: No longer required.   Mammogram status: No longer required due to age.  Bone Density status: Completed 2018. Results reflect: Bone density results: OSTEOPOROSIS. Repeat every 0 years.  Lung Cancer Screening: (Low Dose CT Chest recommended if Age 31-80 years, 30 pack-year currently smoking OR have quit w/in 15years.) does not qualify.   Lung Cancer Screening Referral: no  Additional Screening:  Hepatitis C Screening: does not qualify; Completed yes  Vision Screening: Recommended annual ophthalmology exams for early detection of glaucoma and other disorders of the eye. Is the patient up to date with their annual eye exam?  Yes  Who is the provider or what is the name of the office in which the patient attends annual eye exams? Dr. Truman [facility] If pt is not established with a provider, would they like to be referred to a provider to establish care? No .   Dental Screening: Recommended annual dental exams for proper oral hygiene  Community Resource Referral / Chronic Care Management: CRR  required this visit?  No   CCM  required this visit?  No      Plan:  -Will obtain fasting labs. -Recommend repeating UA at follow up visit, patient unable to provide sample today. Patient's daughter reports some hematuria. -Continue current medication regimen. -Follow up as scheduled.  I have personally reviewed and noted the following in the patient's chart:   Medical and social history Use of alcohol, tobacco or illicit drugs  Current medications and supplements including opioid prescriptions.  Functional ability and status Nutritional status Physical activity Advanced directives List of other physicians Hospitalizations, surgeries, and ER visits in previous 12 months Vitals Screenings to include cognitive, depression, and falls Referrals and appointments  In addition, I have reviewed and discussed with patient certain preventive protocols, quality metrics, and best practice recommendations. A written personalized care plan for preventive services as well as general preventive health recommendations were provided to patient.      Lorrene Reid, PA-C  11/28/2020

## 2020-11-28 NOTE — Patient Instructions (Signed)
Preventive Care 40 Years and Older, Female Preventive care refers to lifestyle choices and visits with your health care provider that can promote health and wellness. Preventive care visits are also called wellness exams. What can I expect for my preventive care visit? Counseling Your health care provider may ask you questions about your: Medical history, including: Past medical problems. Family medical history. Pregnancy and menstrual history. History of falls. Current health, including: Memory and ability to understand (cognition). Emotional well-being. Home life and relationship well-being. Sexual activity and sexual health. Lifestyle, including: Alcohol, nicotine or tobacco, and drug use. Access to firearms. Diet, exercise, and sleep habits. Work and work Statistician. Sunscreen use. Safety issues such as seatbelt and bike helmet use. Physical exam Your health care provider will check your: Height and weight. These may be used to calculate your BMI (body mass index). BMI is a measurement that tells if you are at a healthy weight. Waist circumference. This measures the distance around your waistline. This measurement also tells if you are at a healthy weight and may help predict your risk of certain diseases, such as type 2 diabetes and high blood pressure. Heart rate and blood pressure. Body temperature. Skin for abnormal spots. What immunizations do I need? Vaccines are usually given at various ages, according to a schedule. Your health care provider will recommend vaccines for you based on your age, medical history, and lifestyle or other factors, such as travel or where you work. What tests do I need? Screening Your health care provider may recommend screening tests for certain conditions. This may include: Lipid and cholesterol levels. Hepatitis C test. Hepatitis B test. HIV (human immunodeficiency virus) test. STI (sexually transmitted infection) testing, if you are at  risk. Lung cancer screening. Colorectal cancer screening. Diabetes screening. This is done by checking your blood sugar (glucose) after you have not eaten for a while (fasting). Mammogram. Talk with your health care provider about how often you should have regular mammograms. BRCA-related cancer screening. This may be done if you have a family history of breast, ovarian, tubal, or peritoneal cancers. Bone density scan. This is done to screen for osteoporosis. Talk with your health care provider about your test results, treatment options, and if necessary, the need for more tests. Follow these instructions at home: Eating and drinking  Eat a diet that includes fresh fruits and vegetables, whole grains, lean protein, and low-fat dairy products. Limit your intake of foods with high amounts of sugar, saturated fats, and salt. Take vitamin and mineral supplements as recommended by your health care provider. Do not drink alcohol if your health care provider tells you not to drink. If you drink alcohol: Limit how much you have to 0-1 drink a day. Know how much alcohol is in your drink. In the [place] one drink equals one 12 oz bottle of beer (355 mL), one 5 oz glass of wine (148 mL), or one 1 oz glass of hard liquor (44 mL). Lifestyle Brush your teeth every morning and night with fluoride toothpaste. Floss one time each day. Exercise for at least 30 minutes 5 or more days each week. Do not use any products that contain nicotine or tobacco. These products include cigarettes, chewing tobacco, and vaping devices, such as e-cigarettes. If you need help quitting, ask your health care provider. Do not use drugs. If you are sexually active, practice safe sex. Use a condom or other form of protection in order to prevent STIs. Take aspirin only as told by your  health care provider. Make sure that you understand how much to take and what form to take. Work with your health care provider to find out whether it  is safe and beneficial for you to take aspirin daily. Ask your health care provider if you need to take a cholesterol-lowering medicine (statin). Find healthy ways to manage stress, such as: Meditation, yoga, or listening to music. Journaling. Talking to a trusted person. Spending time with friends and family. Minimize exposure to UV radiation to reduce your risk of skin cancer. Safety Always wear your seat belt while driving or riding in a vehicle. Do not drive: If you have been drinking alcohol. Do not ride with someone who has been drinking. When you are tired or distracted. While texting. If you have been using any mind-altering substances or drugs. Wear a helmet and other protective equipment during sports activities. If you have firearms in your house, make sure you follow all gun safety procedures. What's next? Visit your health care provider once a year for an annual wellness visit. Ask your health care provider how often you should have your eyes and teeth checked. Stay up to date on all vaccines. This information is not intended to replace advice given to you by your health care provider. Make sure you discuss any questions you have with your health care provider. Document Revised: 07/09/2020 Document Reviewed: 07/09/2020 Elsevier Patient Education  [place].

## 2020-11-29 LAB — COMPREHENSIVE METABOLIC PANEL
ALT: 10 IU/L (ref 0–32)
AST: 17 IU/L (ref 0–40)
Albumin/Globulin Ratio: 2 (ref 1.2–2.2)
Albumin: 4.2 g/dL (ref 3.5–4.6)
Alkaline Phosphatase: 96 IU/L (ref 44–121)
BUN/Creatinine Ratio: 15 (ref 12–28)
BUN: 18 mg/dL (ref 10–36)
Bilirubin Total: 0.4 mg/dL (ref 0.0–1.2)
CO2: 25 mmol/L (ref 20–29)
Calcium: 9.8 mg/dL (ref 8.7–10.3)
Chloride: 105 mmol/L (ref 96–106)
Creatinine, Ser: 1.17 mg/dL — ABNORMAL HIGH (ref 0.57–1.00)
Globulin, Total: 2.1 g/dL (ref 1.5–4.5)
Glucose: 93 mg/dL (ref 70–99)
Potassium: 4.1 mmol/L (ref 3.5–5.2)
Sodium: 144 mmol/L (ref 134–144)
Total Protein: 6.3 g/dL (ref 6.0–8.5)
eGFR: 44 mL/min/{1.73_m2} — ABNORMAL LOW (ref >59)

## 2020-11-29 LAB — LIPID PANEL
Chol/HDL Ratio: 2.3 ratio (ref 0.0–4.4)
Cholesterol, Total: 149 mg/dL (ref 100–199)
HDL: 64 mg/dL (ref >39)
LDL Chol Calc (NIH): 70 mg/dL (ref 0–99)
Triglycerides: 75 mg/dL (ref 0–149)
VLDL Cholesterol Cal: 15 mg/dL (ref 5–40)

## 2020-11-29 LAB — CBC WITH DIFFERENTIAL/PLATELET
Basophils Absolute: 0 10*3/uL (ref 0.0–0.2)
Basos: 1 %
EOS (ABSOLUTE): 0.2 10*3/uL (ref 0.0–0.4)
Eos: 3 %
Hematocrit: 42.7 % (ref 34.0–46.6)
Hemoglobin: 13.7 g/dL (ref 11.1–15.9)
Immature Grans (Abs): 0 10*3/uL (ref 0.0–0.1)
Immature Granulocytes: 0 %
Lymphocytes Absolute: 1.7 10*3/uL (ref 0.7–3.1)
Lymphs: 25 %
MCH: 30.5 pg (ref 26.6–33.0)
MCHC: 32.1 g/dL (ref 31.5–35.7)
MCV: 95 fL (ref 79–97)
Monocytes Absolute: 0.5 10*3/uL (ref 0.1–0.9)
Monocytes: 8 %
Neutrophils Absolute: 4.3 10*3/uL (ref 1.4–7.0)
Neutrophils: 63 %
Platelets: 167 10*3/uL (ref 150–450)
RBC: 4.49 x10E6/uL (ref 3.77–5.28)
RDW: 14.3 % (ref 11.7–15.4)
WBC: 6.7 10*3/uL (ref 3.4–10.8)

## 2020-11-29 LAB — TSH: TSH: 2.53 u[IU]/mL (ref 0.450–4.500)

## 2020-11-29 LAB — T4, FREE: Free T4: 1.45 ng/dL (ref 0.82–1.77)

## 2020-11-29 LAB — HEMOGLOBIN A1C
Est. average glucose Bld gHb Est-mCnc: 111 mg/dL
Hgb A1c MFr Bld: 5.5 % (ref 4.8–5.6)

## 2020-12-01 ENCOUNTER — Ambulatory Visit (INDEPENDENT_AMBULATORY_CARE_PROVIDER_SITE_OTHER): Payer: Medicare HMO | Admitting: Physician Assistant

## 2020-12-01 ENCOUNTER — Other Ambulatory Visit: Payer: Self-pay

## 2020-12-01 ENCOUNTER — Encounter: Payer: Self-pay | Admitting: Physician Assistant

## 2020-12-01 VITALS — BP 138/81 | HR 65 | Temp 97.6°F | Ht 64.0 in | Wt 149.0 lb

## 2020-12-01 DIAGNOSIS — Z712 Person consulting for explanation of examination or test findings: Secondary | ICD-10-CM | POA: Diagnosis not present

## 2020-12-01 DIAGNOSIS — H6123 Impacted cerumen, bilateral: Secondary | ICD-10-CM

## 2020-12-01 DIAGNOSIS — R319 Hematuria, unspecified: Secondary | ICD-10-CM

## 2020-12-01 LAB — POCT URINALYSIS DIPSTICK
Bilirubin, UA: NEGATIVE
Blood, UA: NEGATIVE
Glucose, UA: NEGATIVE
Ketones, UA: NEGATIVE
Leukocytes, UA: NEGATIVE
Nitrite, UA: NEGATIVE
Protein, UA: NEGATIVE
Spec Grav, UA: 1.015 (ref 1.010–1.025)
Urobilinogen, UA: 0.2 E.U./dL
pH, UA: 7 (ref 5.0–8.0)

## 2020-12-01 NOTE — Patient Instructions (Addendum)
Ear Irrigation Ear irrigation is a procedure to wash dirt and wax out of your ear canal. This procedure is also called lavage. You may need ear irrigation if you are having trouble hearing because of a buildup of earwax. You may also have ear irrigation as part of the treatment for an ear infection. Getting wax and dirtout of your ear canal can help ear drops work better. Tell a health care provider about: Any allergies you have. All medicines you are taking, including vitamins, herbs, eye drops, creams, and over-the-counter medicines. Any problems you or family members have had with anesthetic medicines. Any blood disorders you have. Any surgeries you have had. This includes any ear surgeries. Any medical conditions you have. Whether you are pregnant or may be pregnant. What are the risks? Generally, this is a safe procedure. However, problems may occur, including: Infection. Pain. Hearing loss. Fluid and debris being pushed through the eardrum and into the middle ear. This can occur if there are holes in the eardrum. Ear irrigation failing to work. What happens before the procedure? You will talk with your provider about the procedure and plan. You may be given ear drops to put in your ear 15-20 minutes before irrigation. This helps loosen the wax. What happens during the procedure?  A syringe is filled with water or saline solution, which is made of salt and water. The syringe is gently inserted into the ear canal. The fluid is used to flush out wax and other debris. The procedure may vary among health care providers and hospitals. What can I expect after the procedure? After an ear irrigation, follow instructions given to you by your health careprovider. Follow these instructions at home: Using ear irrigation kits Ear irrigation kits are available for use at home. Ask your health care provider if this is an option for you. In general, you should: Use a home irrigation kit only as  told by your health care provider. Read the package instructions carefully. Follow the directions for using the syringe. Use water that is room temperature. Do not do ear irrigation at home if you: Have diabetes. Diabetes increases the risk of infection. Have a hole or tear in your eardrum. Have tubes in your ears. Have had any ear surgery in the past. Have been told not to irrigate your ears. Cleaning your ears  Clean the outside of your ear with a soft washcloth daily. If told by your health care provider, use a few drops of baby oil, mineral oil, glycerin, hydrogen peroxide, or over-the-counter earwax softening drops. Do not use cotton swabs to clean your ears. These can push wax down into the ear canal. Do not put anything into your ears to try to remove wax. This includes ear candles.  General instructions Take over-the-counter and prescription medicines only as told by your health care provider. If you were prescribed an antibiotic medicine, use it as told by your health care provider. Do not stop using the antibiotic even if your condition improves. Keep the ear clean and dry by following the instructions from your health care provider. Keep all follow-up visits. This is important. Visit your health care provider at least once a year to have your ears and hearing checked. Contact a health care provider if: Your hearing is not improving or is getting worse. You have pain or redness in your ear. You are dizzy. You have ringing in your ears. You have nausea or vomiting. You have fluid, blood, or pus coming out of   your ear. Summary Ear irrigation is a procedure to wash dirt and wax out of your ear canal. This procedure is also called lavage. To perform ear irrigation, ear drops may be put in your ear 15-20 minutes before irrigation. Water or saline solution will be used to flush out earwax and other debris. You may be able to irrigate your ears at home. Ask your health care  provider if this is an option for you. Follow your health care provider's instructions. Clean your ears with a soft cloth after irrigation. Do not use cotton swabs to clean your ears. These can push wax down into the ear canal. This information is not intended to replace advice given to you by your health care provider. Make sure you discuss any questions you have with your healthcare provider. Document Revised: 05/01/2019 Document Reviewed: 05/01/2019 Elsevier Patient Education  2022 [employer]  

## 2020-12-01 NOTE — Progress Notes (Signed)
Established Patient Office Visit  Subjective:  Patient ID: Donna Arias, female    DOB: 1926/08/26  Age: 87 y.o. MRN: [id number]  CC:  Chief Complaint  Patient presents with   Cerumen Impaction     HPI Donna Arias presents for ear lavage for bilateral cerumen impaction. Denies ear pain, fever, chills or otorrhea. Patient has a history of recurrent  UTI's and patient's daughter noticed some pink tinged on pad few days ago. Patient has urinary incontinence. Denis abdominal pain, flank pain, nausea or vomiting.  Past Medical History:  Diagnosis Date   Chronic headaches    COPD (chronic obstructive pulmonary disease) (HCC)    Dementia (HCC)    Eye infection, left 01/16/2018   GERD (gastroesophageal reflux disease)    Heart attack ([place]) 1998   HLD (hyperlipidemia)    Hypertension    Skin cancer    squamous cell of left face   Stroke ([place])    Thyroid disease     Past Surgical History:  Procedure Laterality Date   BREAST LUMPECTOMY Left 1995   DILATION AND CURETTAGE OF UTERUS  1975   [place]    Family History  Problem Relation Age of Onset   Hypertension Mother    Stroke Mother    Stroke Sister    Alzheimer's disease Sister    Stroke Sister    Hypertension Sister    Stroke Sister    Breast cancer Sister     Social History   Socioeconomic History   Marital status: Widowed    Spouse name: Not on file   Number of children: 1   Years of education: Not on file   Highest education level: Not on file  Occupational History   Occupation: RETIRED  Tobacco Use   Smoking status: Former    Types: Cigarettes    Quit date: 1995    Years since quitting: 27.8   Smokeless tobacco: Never  Vaping Use   Vaping Use: Never used  Substance and Sexual Activity   Alcohol use: No   Drug use: No   Sexual activity: Never    Birth control/protection: None  Other Topics Concern   Not on file  Social History Narrative   CB x 1   Lives at home with her  daughter (since 02/05/2013)   Right handed   Regular exercise - NO   Social Determinants of Health   Financial Resource Strain: Not on file  Food Insecurity: Not on file  Transportation Needs: Not on file  Physical Activity: Not on file  Stress: Not on file  Social Connections: Not on file  Intimate Partner Violence: Not on file    Outpatient Medications Prior to Visit  Medication Sig Dispense Refill   albuterol (PROVENTIL) (2.5 MG/3ML) 0.083% nebulizer solution Take 3 mLs (2.5 mg total) by nebulization every 6 (six) hours as needed for wheezing or shortness of breath. 75 mL 6   amLODipine (NORVASC) 2.5 MG tablet TAKE 1 TABLET (2.5 MG TOTAL) BY MOUTH DAILY. 90 tablet 0   aspirin 81 MG tablet Take 81 mg by mouth daily.     atorvastatin (LIPITOR) 10 MG tablet TAKE 1 TABLET BY MOUTH DAILY. 90 tablet 0   benzonatate (TESSALON) 100 MG capsule Take 1 capsule (100 mg total) by mouth 3 (three) times daily as needed for cough. 30 capsule 1   budesonide (PULMICORT) 0.25 MG/2ML nebulizer solution Take 2 mLs (0.25 mg total) by nebulization in the morning and at  bedtime. 120 mL 11   Calcium Carbonate-Vit D-Min (CALCIUM 1200 PO) Take 1 tablet by mouth.     erythromycin ophthalmic ointment Place 1 application into the left eye at bedtime. 3.5 g 2   fexofenadine (ALLEGRA) 180 MG tablet Take 180 mg by mouth daily.     FLUoxetine (PROZAC) 10 MG capsule TAKE 1 CAPSULE BY MOUTH DAILY. 90 capsule 0   fluticasone (FLONASE) 50 MCG/ACT nasal spray Place 1 spray into both nostrils 2 (two) times daily. 16 g 6   furosemide (LASIX) 20 MG tablet TAKE 1 TABLET BY MOUTH DAILY. 90 tablet 0   ketoconazole (NIZORAL) 2 % shampoo Apply topically 2 (two) times a week. 120 mL 1   levothyroxine (SYNTHROID) 25 MCG tablet TAKE 1/2 TABLET BY MOUTH DAILY WITH 50 MCG TO EQUAL 62.5 MCG TOTAL DAILY 45 tablet 0   levothyroxine (SYNTHROID) 50 MCG tablet TAKE 1 TABLET BY MOUTH EVERY DAY 90 tablet 0   potassium chloride SA (KLOR-CON)  20 MEQ tablet TAKE 1 TABLET BY MOUTH ONCE DAILY. 90 tablet 0   risperiDONE (RISPERDAL) 1 MG tablet TAKE 1 TABLET BY MOUTH 2 TIMES DAILY. 180 tablet 1   STIOLTO RESPIMAT 2.5-2.5 MCG/ACT AERS INHALE 2 PUFFS INTO THE LUNGS DAILY. 4 g 2   triamcinolone cream (KENALOG) 0.1 % Apply 1 application topically 2 (two) times daily. Request jar 453 g 0   Facility-Administered Medications Prior to Visit  Medication Dose Route Frequency Provider Last Rate Last Admin   ipratropium-albuterol (DUONEB) 0.5-2.5 (3) MG/3ML nebulizer solution 3 mL  3 mL Nebulization Q6H Danford, Katy D, NP   3 mL at 12/08/17 3235    Allergies  Allergen Reactions   Codeine Sulfate     REACTION: unspecified   Picato [Ingenol Mebutate] Hives    ROS Review of Systems Review of Systems:  A fourteen system review of systems was performed and found to be positive as per HPI.  Objective:    Physical Exam General:  Well Developed, well nourished, in no acute distress  Neuro:  Alert and oriented,  extra-ocular muscles intact  HEENT:  Normocephalic, atraumatic, cerumen impaction of both ears, neck supple, no adenopathy  Skin:  no gross rash, warm, pink. Respiratory: Not using accessory muscles, speaking in full sentences- unlabored. Vascular:  Ext warm, no cyanosis apprec.; cap RF less 2 sec. Psych:  No HI/SI, judgement and insight good, Euthymic mood. Full Affect.  BP 138/81   Pulse 65   Temp 97.6 F (36.4 C)   Ht $R'5\' 4"'yF$  (1.626 m)   Wt 149 lb (67.6 kg)   SpO2 97%   BMI 25.58 kg/m  Wt Readings from Last 3 Encounters:  12/01/20 149 lb (67.6 kg)  11/28/20 148 lb (67.1 kg)  11/07/20 148 lb (67.1 kg)     Health Maintenance Due  Topic Date Due   TETANUS/TDAP  02/25/2018   COVID-19 Vaccine (4 - Booster for Pfizer series) 06/26/2019    There are no preventive care reminders to display for this patient.  Lab Results  Component Value Date   TSH 2.530 11/28/2020   Lab Results  Component Value Date   WBC 6.7  11/28/2020   HGB 13.7 11/28/2020   HCT 42.7 11/28/2020   MCV 95 11/28/2020   PLT 167 11/28/2020   Lab Results  Component Value Date   NA 144 11/28/2020   K 4.1 11/28/2020   CO2 25 11/28/2020   GLUCOSE 93 11/28/2020   BUN 18 11/28/2020   CREATININE  1.17 (H) 11/28/2020   BILITOT 0.4 11/28/2020   ALKPHOS 96 11/28/2020   AST 17 11/28/2020   ALT 10 11/28/2020   PROT 6.3 11/28/2020   ALBUMIN 4.2 11/28/2020   CALCIUM 9.8 11/28/2020   EGFR 44 (L) 11/28/2020   GFR 45.15 (L) 08/01/2015   Lab Results  Component Value Date   CHOL 149 11/28/2020   Lab Results  Component Value Date   HDL 64 11/28/2020   Lab Results  Component Value Date   LDLCALC 70 11/28/2020   Lab Results  Component Value Date   TRIG 75 11/28/2020   Lab Results  Component Value Date   CHOLHDL 2.3 11/28/2020   Lab Results  Component Value Date   HGBA1C 5.5 11/28/2020      Assessment & Plan:   Problem List Items Addressed This Visit   None Visit Diagnoses     Bilateral impacted cerumen    -  Primary   Hematuria, unspecified type       Relevant Orders   POCT urinalysis dipstick (Completed)   Urine Culture   Encounter to discuss test results          Indication: Cerumen impaction of both ear(s) Medical necessity statement:  On physical examination, cerumen impairs clinically significant portions of the external auditory canal, and tympanic membrane.  Consent:  Discussed benefits and risks of procedure and verbal consent obtained Procedure:   Patient was prepped for the procedure.  Utilized an otoscope to assess and take note of the ear canal, the tympanic membrane, and the presence, amount, and placement of the cerumen. Gentle water irrigation and soft plastic curette was utilized to remove cerumen. Post procedure examination:  shows cerumen was removed, without trauma or injury to the ear canal or TM, which remains intact.   Post-Procedural Ear Care Instructions:    Patient tolerated procedure  well.  Proper ear care d/c pt.   The patient is made aware that they may experience temporary vertigo, temporary hearing loss, and temporary discomfort.  If these symptom last for more than 24 hours to call the clinic or proceed to the ED/Urgent Care.  Hematuria:  -UA collected, unremarkable. Will send for urine culture to r/o Cystitis.   Discussed most recent lab results which are essentially within normal limits or stable from prior. Kidney function stable. Will continue to monitor.   No orders of the defined types were placed in this encounter.   Follow-up: Return in about 6 months (around 05/31/2021) for CKD, HTN, thyroid.    Lorrene Reid, PA-C

## 2020-12-02 ENCOUNTER — Other Ambulatory Visit: Payer: Self-pay | Admitting: Physician Assistant

## 2020-12-02 DIAGNOSIS — Z760 Encounter for issue of repeat prescription: Secondary | ICD-10-CM

## 2020-12-05 LAB — URINE CULTURE

## 2020-12-08 ENCOUNTER — Ambulatory Visit: Payer: Medicare HMO | Admitting: Dermatology

## 2020-12-09 ENCOUNTER — Telehealth: Payer: Self-pay

## 2020-12-09 NOTE — Telephone Encounter (Signed)
Patient's daughter called 12/08/2020 to let us know that Ms. Donna Arias had been having some rectal bleeding over the weekend. She had been constipated. The daughter gave the patient some stool softener that caused her to have diarrhea. The daughter noticed the patient's stool had bright red blood. Sunday afternoon the bleeding had stopped and as of Monday afternoon the bleeding had not come back.

## 2020-12-10 ENCOUNTER — Other Ambulatory Visit: Payer: Self-pay | Admitting: Physician Assistant

## 2020-12-24 ENCOUNTER — Other Ambulatory Visit: Payer: Self-pay | Admitting: Physician Assistant

## 2020-12-30 ENCOUNTER — Other Ambulatory Visit: Payer: Self-pay

## 2020-12-30 ENCOUNTER — Ambulatory Visit (INDEPENDENT_AMBULATORY_CARE_PROVIDER_SITE_OTHER): Payer: Medicare HMO | Admitting: Ophthalmology

## 2020-12-30 ENCOUNTER — Encounter (INDEPENDENT_AMBULATORY_CARE_PROVIDER_SITE_OTHER): Payer: Medicare HMO | Admitting: Ophthalmology

## 2020-12-30 ENCOUNTER — Encounter (INDEPENDENT_AMBULATORY_CARE_PROVIDER_SITE_OTHER): Payer: Self-pay | Admitting: Ophthalmology

## 2020-12-30 DIAGNOSIS — H353124 Nonexudative age-related macular degeneration, left eye, advanced atrophic with subfoveal involvement: Secondary | ICD-10-CM | POA: Diagnosis not present

## 2020-12-30 DIAGNOSIS — H353222 Exudative age-related macular degeneration, left eye, with inactive choroidal neovascularization: Secondary | ICD-10-CM | POA: Diagnosis not present

## 2020-12-30 DIAGNOSIS — H353211 Exudative age-related macular degeneration, right eye, with active choroidal neovascularization: Secondary | ICD-10-CM | POA: Diagnosis not present

## 2020-12-30 MED ORDER — BEVACIZUMAB 2.5 MG/0.1ML IZ SOSY
2.5000 mg | PREFILLED_SYRINGE | INTRAVITREAL | Status: AC | PRN
Start: 2020-12-30 — End: 2020-12-30
  Administered 2020-12-30: 2.5 mg via INTRAVITREAL

## 2020-12-30 NOTE — Assessment & Plan Note (Signed)
Today at 7-week extension interval, small amount of perifoveal CME superior to FAZ.  We will thus repeat injection in the right eye today and examination maintained at 6-week interval OD

## 2020-12-30 NOTE — Progress Notes (Signed)
12/30/2020     CHIEF COMPLAINT Patient presents for  Chief Complaint  Patient presents with   Retina Follow Up      HISTORY OF PRESENT ILLNESS: Donna Arias is a 87 y.o. female who presents to the clinic today for:   HPI     Retina Follow Up   Patient presents with  Wet AMD.  In right eye.  This started 7 weeks ago.  Severity is mild.  Duration of 7 weeks.  Since onset it is stable.        Comments   7 week fu OU oct avastin OD.,  With history of multiple recurrences of perifoveal CNVM OD today at 7-week interval as compared to the usual 6-week interval follow-up  OS with atrophic dry AMD Patient states vision is stable and unchanged since last visit. Denies any new floaters or FOL.       Last edited by Hurman Horn, MD on 12/30/2020  2:10 PM.      Referring physician: Lorrene Reid, PA-C [address],  [place] [address]  HISTORICAL INFORMATION:   Selected notes from the MEDICAL RECORD NUMBER    Lab Results  Component Value Date   HGBA1C 5.5 11/28/2020     CURRENT MEDICATIONS: Current Outpatient Medications (Ophthalmic Drugs)  Medication Sig   erythromycin ophthalmic ointment Place 1 application into the left eye at bedtime.   No current facility-administered medications for this visit. (Ophthalmic Drugs)   Current Outpatient Medications (Other)  Medication Sig   albuterol (PROVENTIL) (2.5 MG/3ML) 0.083% nebulizer solution Take 3 mLs (2.5 mg total) by nebulization every 6 (six) hours as needed for wheezing or shortness of breath.   amLODipine (NORVASC) 2.5 MG tablet TAKE 1 TABLET (2.5 MG TOTAL) BY MOUTH DAILY.   aspirin 81 MG tablet Take 81 mg by mouth daily.   atorvastatin (LIPITOR) 10 MG tablet TAKE 1 TABLET BY MOUTH DAILY.   benzonatate (TESSALON) 100 MG capsule Take 1 capsule (100 mg total) by mouth 3 (three) times daily as needed for cough.   budesonide (PULMICORT) 0.25 MG/2ML nebulizer solution Take 2 mLs (0.25 mg total)  by nebulization in the morning and at bedtime.   Calcium Carbonate-Vit D-Min (CALCIUM 1200 PO) Take 1 tablet by mouth.   fexofenadine (ALLEGRA) 180 MG tablet Take 180 mg by mouth daily.   FLUoxetine (PROZAC) 10 MG capsule TAKE 1 CAPSULE BY MOUTH DAILY.   fluticasone (FLONASE) 50 MCG/ACT nasal spray Place 1 spray into both nostrils 2 (two) times daily.   furosemide (LASIX) 20 MG tablet TAKE 1 TABLET BY MOUTH DAILY.   ketoconazole (NIZORAL) 2 % shampoo Apply topically 2 (two) times a week.   levothyroxine (SYNTHROID) 25 MCG tablet TAKE 1/2 TABLET BY MOUTH DAILY WITH 50 MCG TO EQUAL 62.5 MCG TOTAL DAILY   levothyroxine (SYNTHROID) 50 MCG tablet TAKE 1 TABLET BY MOUTH EVERY DAY   potassium chloride SA (KLOR-CON) 20 MEQ tablet TAKE 1 TABLET BY MOUTH ONCE DAILY.   risperiDONE (RISPERDAL) 1 MG tablet TAKE 1 TABLET BY MOUTH 2 TIMES DAILY.   STIOLTO RESPIMAT 2.5-2.5 MCG/ACT AERS INHALE 2 PUFFS INTO THE LUNGS DAILY.   triamcinolone cream (KENALOG) 0.1 % Apply 1 application topically 2 (two) times daily. Request jar   Current Facility-Administered Medications (Other)  Medication Route   ipratropium-albuterol (DUONEB) 0.5-2.5 (3) MG/3ML nebulizer solution 3 mL Nebulization      REVIEW OF SYSTEMS:    ALLERGIES Allergies  Allergen Reactions  Codeine Sulfate     REACTION: unspecified   Picato [Ingenol Mebutate] Hives    PAST MEDICAL HISTORY Past Medical History:  Diagnosis Date   Chronic headaches    COPD (chronic obstructive pulmonary disease) (HCC)    Dementia (HCC)    Eye infection, left 01/16/2018   GERD (gastroesophageal reflux disease)    Heart attack ([place]) 1998   HLD (hyperlipidemia)    Hypertension    Skin cancer    squamous cell of left face   Stroke ([place])    Thyroid disease    Past Surgical History:  Procedure Laterality Date   BREAST LUMPECTOMY Left 1995   DILATION AND CURETTAGE OF UTERUS  1975   GALLBLADDER SURGERY  1993    FAMILY HISTORY Family History   Problem Relation Age of Onset   Hypertension Mother    Stroke Mother    Stroke Sister    Alzheimer's disease Sister    Stroke Sister    Hypertension Sister    Stroke Sister    Breast cancer Sister     SOCIAL HISTORY Social History   Tobacco Use   Smoking status: Former    Types: Cigarettes    Quit date: 1995    Years since quitting: 27.9   Smokeless tobacco: Never  Vaping Use   Vaping Use: Never used  Substance Use Topics   Alcohol use: No   Drug use: No         OPHTHALMIC EXAM:  Base Eye Exam     Visual Acuity (ETDRS)       Right Left   Dist cc 20/40 -2 CF at 3'   Dist ph cc NI NI    Correction: Glasses         Tonometry (Tonopen, 1:30 PM)       Right Left   Pressure 8 9         Pupils       Pupils Dark Light APD   Right PERRL 4 3 None   Left PERRL 4 3 +1         Extraocular Movement       Right Left    Full Full         Neuro/Psych     Oriented x3: Yes   Mood/Affect: Normal         Dilation     Both eyes: 1.0% Mydriacyl, 2.5% Phenylephrine @ 1:30 PM           Slit Lamp and Fundus Exam     External Exam       Right Left   External Normal Normal         Slit Lamp Exam       Right Left   Lids/Lashes Normal Normal   Conjunctiva/Sclera White and quiet White and quiet   Cornea Clear Clear   Anterior Chamber Deep and quiet Deep and quiet   Iris Round and reactive Round and reactive   Lens Centered posterior chamber intraocular lens Centered posterior chamber intraocular lens   Anterior Vitreous Normal Normal         Fundus Exam       Right Left   Posterior Vitreous Posterior vitreous detachment    Disc Peripapillary atrophy    C/D Ratio 0.65 0.6   Macula Retinal pigment epithelial mottling, no macular thickening, Retinal atrophy, Early age related macular degeneration, no serous retinal detachment visible clinically, minor epiretinal membrane not distorting Advanced age related macular degeneration,  Geographic atrophy  1.5 - 2.0 disc areas foveal atrophy, no macular thickening, no hemorrhage   Vessels Normal    Periphery Normal             IMAGING AND PROCEDURES  Imaging and Procedures for 12/30/20  OCT, Retina - OU - Both Eyes       Right Eye Quality was good. Scan locations included subfoveal. Central Foveal Thickness: 300. Progression has improved. Findings include abnormal foveal contour, cystoid macular edema.   Left Eye Quality was good. Scan locations included nasal. Central Foveal Thickness: 132. Progression has been stable. Findings include abnormal foveal contour, inner retinal atrophy, central retinal atrophy, outer retinal atrophy.   Notes Small amount of intraretinal fluid and CME perifoveal at 7-week interval.  OD.  We will repeat injection Avastin OD today and examination next in 6 weeks OS with macular atrophy from previous retinal vascular disease.  Stable overall     Intravitreal Injection, Pharmacologic Agent - OD - Right Eye       Time Out 12/30/2020. 2:13 PM. Confirmed correct patient, procedure, site, and patient consented.   Anesthesia Topical anesthesia was used. Anesthetic medications included Akten 3.5%.   Procedure Preparation included Ofloxacin , Tobramycin 0.3%, 10% betadine to eyelids, 5% betadine to ocular surface. A 30 gauge needle was used.   Injection: 2.5 mg bevacizumab 2.5 MG/0.1ML   Route: Intravitreal, Site: Right Eye   NDC: [phone], Lot: [id number]   Post-op Post injection exam found visual acuity of at least counting fingers. The patient tolerated the procedure well. There were no complications. The patient received written and verbal post procedure care education. Post injection medications included ocuflox.              ASSESSMENT/PLAN:  Exudative age-related macular degeneration of left eye with inactive choroidal neovascularization (HCC) No signs of recurrence with diffuse central atrophy  Advanced  nonexudative age-related macular degeneration of left eye with subfoveal involvement Atrophy OS accounts for acuity  Exudative age-related macular degeneration of right eye with active choroidal neovascularization ([place]) Today at 7-week extension interval, small amount of perifoveal CME superior to FAZ.  We will thus repeat injection in the right eye today and examination maintained at 6-week interval OD     ICD-10-CM   1. Exudative age-related macular degeneration of right eye with active choroidal neovascularization (HCC)  H35.3211 OCT, Retina - OU - Both Eyes    Intravitreal Injection, Pharmacologic Agent - OD - Right Eye    bevacizumab (AVASTIN) SOSY 2.5 mg    2. Exudative age-related macular degeneration of left eye with inactive choroidal neovascularization ([place])  H35.3222     3. Advanced nonexudative age-related macular degeneration of left eye with subfoveal involvement  H35.3124       1.  OD, with preserved good acuity, yet with slight recurrence of intraretinal fluid superior to FAZ at 7-week follow-up interval.  Will repeat injection today Avastin and maintain 6-week interval or less  2.  3.  Ophthalmic Meds Ordered this visit:  Meds ordered this encounter  Medications   bevacizumab (AVASTIN) SOSY 2.5 mg       Return in about 6 weeks (around 02/10/2021) for dilate, OD, AVASTIN OCT.  There are no Patient Instructions on file for this visit.   Explained the diagnoses, plan, and follow up with the patient and they expressed understanding.  Patient expressed understanding of the importance of proper follow up care.   Clent Demark Shaquoia Miers M.D. Diseases & Surgery of the Retina and Vitreous [facility] 12/30/20     Abbreviations: M myopia (nearsighted); A astigmatism; H hyperopia (farsighted); P presbyopia; Mrx spectacle prescription;  CTL contact lenses; OD right eye; OS left eye; OU both eyes  XT exotropia; ET esotropia; PEK punctate epithelial keratitis;  PEE punctate epithelial erosions; DES dry eye syndrome; MGD meibomian gland dysfunction; ATs artificial tears; PFAT's preservative free artificial tears; [place] nuclear sclerotic cataract; PSC posterior subcapsular cataract; ERM epi-retinal membrane; PVD posterior vitreous detachment; RD retinal detachment; DM diabetes mellitus; DR diabetic retinopathy; NPDR non-proliferative diabetic retinopathy; PDR proliferative diabetic retinopathy; CSME clinically significant macular edema; DME diabetic macular edema; dbh dot blot hemorrhages; CWS cotton wool spot; POAG primary open angle glaucoma; C/D cup-to-disc ratio; HVF humphrey visual field; GVF goldmann visual field; OCT optical coherence tomography; IOP intraocular pressure; BRVO Branch retinal vein occlusion; CRVO central retinal vein occlusion; CRAO central retinal artery occlusion; BRAO branch retinal artery occlusion; RT retinal tear; SB scleral buckle; PPV pars plana vitrectomy; VH Vitreous hemorrhage; PRP panretinal laser photocoagulation; IVK intravitreal kenalog; VMT vitreomacular traction; MH Macular hole;  NVD neovascularization of the disc; NVE neovascularization elsewhere; AREDS age related eye disease study; ARMD age related macular degeneration; POAG primary open angle glaucoma; EBMD epithelial/anterior basement membrane dystrophy; ACIOL anterior chamber intraocular lens; IOL intraocular lens; PCIOL posterior chamber intraocular lens; Phaco/IOL phacoemulsification with intraocular lens placement; [place] photorefractive keratectomy; LASIK laser assisted in situ keratomileusis; HTN hypertension; DM diabetes mellitus; COPD chronic obstructive pulmonary disease

## 2020-12-30 NOTE — Assessment & Plan Note (Signed)
Atrophy OS accounts for acuity

## 2020-12-30 NOTE — Assessment & Plan Note (Signed)
No signs of recurrence with diffuse central atrophy

## 2021-01-12 ENCOUNTER — Other Ambulatory Visit: Payer: Self-pay | Admitting: Physician Assistant

## 2021-01-12 DIAGNOSIS — E039 Hypothyroidism, unspecified: Secondary | ICD-10-CM

## 2021-01-15 ENCOUNTER — Other Ambulatory Visit: Payer: Self-pay

## 2021-01-15 ENCOUNTER — Encounter: Payer: Self-pay | Admitting: Physician Assistant

## 2021-01-15 ENCOUNTER — Ambulatory Visit (INDEPENDENT_AMBULATORY_CARE_PROVIDER_SITE_OTHER): Payer: Medicare HMO | Admitting: Physician Assistant

## 2021-01-15 VITALS — BP 134/78 | HR 80 | Temp 97.5°F | Ht 64.0 in | Wt 149.0 lb

## 2021-01-15 DIAGNOSIS — J441 Chronic obstructive pulmonary disease with (acute) exacerbation: Secondary | ICD-10-CM

## 2021-01-15 DIAGNOSIS — R051 Acute cough: Secondary | ICD-10-CM | POA: Diagnosis not present

## 2021-01-15 DIAGNOSIS — J069 Acute upper respiratory infection, unspecified: Secondary | ICD-10-CM

## 2021-01-15 LAB — POCT INFLUENZA A/B
Influenza A, POC: NEGATIVE
Influenza B, POC: NEGATIVE

## 2021-01-15 LAB — POCT RESPIRATORY SYNCYTIAL VIRUS: RSV Rapid Ag: NEGATIVE

## 2021-01-15 MED ORDER — PREDNISONE 20 MG PO TABS: ORAL_TABLET | ORAL | 0 refills | Status: DC

## 2021-01-15 MED ORDER — AZITHROMYCIN 250 MG PO TABS: ORAL_TABLET | ORAL | 0 refills | Status: AC

## 2021-01-15 NOTE — Progress Notes (Signed)
Acute Office Visit  Subjective:    Patient ID: Donna Arias, female    DOB: Jul 03, 1926, 87 y.o.   MRN: [id number]  Chief Complaint  Patient presents with   Acute Visit    HPI Patient is in today for c/o sneezing, cough, watery eyes, chest congestion, decreased appetite, sinus pressure (right sided) and productive cough with yellow-green drainage which started this morning. Had a sore throat earlier this week but not today. Symptoms started 5 days ago. No wheezing, fever, chest pain, palpitations, confusion, chills, n/v/d. Has been taking Mucinex which has not been as effective as before. Patient's daughter does report other household members have been sick with similar symptoms.   Past Medical History:  Diagnosis Date   Chronic headaches    COPD (chronic obstructive pulmonary disease) (HCC)    Dementia (HCC)    Eye infection, left 01/16/2018   GERD (gastroesophageal reflux disease)    Heart attack ([place]) 1998   HLD (hyperlipidemia)    Hypertension    Skin cancer    squamous cell of left face   Stroke ([place])    Thyroid disease     Past Surgical History:  Procedure Laterality Date   BREAST LUMPECTOMY Left 1995   DILATION AND CURETTAGE OF UTERUS  1975   [place]    Family History  Problem Relation Age of Onset   Hypertension Mother    Stroke Mother    Stroke Sister    Alzheimer's disease Sister    Stroke Sister    Hypertension Sister    Stroke Sister    Breast cancer Sister     Social History   Socioeconomic History   Marital status: Widowed    Spouse name: Not on file   Number of children: 1   Years of education: Not on file   Highest education level: Not on file  Occupational History   Occupation: RETIRED  Tobacco Use   Smoking status: Former    Types: Cigarettes    Quit date: 1995    Years since quitting: 27.9   Smokeless tobacco: Never  Vaping Use   Vaping Use: Never used  Substance and Sexual Activity   Alcohol use: No   Drug  use: No   Sexual activity: Never    Birth control/protection: None  Other Topics Concern   Not on file  Social History Narrative   CB x 1   Lives at home with her daughter (since 02/05/2013)   Right handed   Regular exercise - NO   Social Determinants of Health   Financial Resource Strain: Not on file  Food Insecurity: Not on file  Transportation Needs: Not on file  Physical Activity: Not on file  Stress: Not on file  Social Connections: Not on file  Intimate Partner Violence: Not on file    Outpatient Medications Prior to Visit  Medication Sig Dispense Refill   albuterol (PROVENTIL) (2.5 MG/3ML) 0.083% nebulizer solution Take 3 mLs (2.5 mg total) by nebulization every 6 (six) hours as needed for wheezing or shortness of breath. 75 mL 6   amLODipine (NORVASC) 2.5 MG tablet TAKE 1 TABLET (2.5 MG TOTAL) BY MOUTH DAILY. 90 tablet 1   aspirin 81 MG tablet Take 81 mg by mouth daily.     atorvastatin (LIPITOR) 10 MG tablet TAKE 1 TABLET BY MOUTH DAILY. 90 tablet 1   benzonatate (TESSALON) 100 MG capsule Take 1 capsule (100 mg total) by mouth 3 (three) times daily as needed  for cough. 30 capsule 1   budesonide (PULMICORT) 0.25 MG/2ML nebulizer solution Take 2 mLs (0.25 mg total) by nebulization in the morning and at bedtime. 120 mL 11   Calcium Carbonate-Vit D-Min (CALCIUM 1200 PO) Take 1 tablet by mouth.     erythromycin ophthalmic ointment Place 1 application into the left eye at bedtime. 3.5 g 2   fexofenadine (ALLEGRA) 180 MG tablet Take 180 mg by mouth daily.     FLUoxetine (PROZAC) 10 MG capsule TAKE 1 CAPSULE BY MOUTH DAILY. 90 capsule 0   fluticasone (FLONASE) 50 MCG/ACT nasal spray Place 1 spray into both nostrils 2 (two) times daily. 16 g 6   furosemide (LASIX) 20 MG tablet TAKE 1 TABLET BY MOUTH DAILY. 90 tablet 0   ketoconazole (NIZORAL) 2 % shampoo Apply topically 2 (two) times a week. 120 mL 1   levothyroxine (SYNTHROID) 25 MCG tablet TAKE 1/2 TABLET BY MOUTH DAILY WITH 50  MCG TO EQUAL 62.5 MCG TOTAL DAILY 45 tablet 0   levothyroxine (SYNTHROID) 50 MCG tablet TAKE 1 TABLET BY MOUTH EVERY DAY 90 tablet 0   potassium chloride SA (KLOR-CON) 20 MEQ tablet TAKE 1 TABLET BY MOUTH ONCE DAILY. 90 tablet 1   risperiDONE (RISPERDAL) 1 MG tablet TAKE 1 TABLET BY MOUTH 2 TIMES DAILY. 180 tablet 1   STIOLTO RESPIMAT 2.5-2.5 MCG/ACT AERS INHALE 2 PUFFS INTO THE LUNGS DAILY. 4 g 2   triamcinolone cream (KENALOG) 0.1 % Apply 1 application topically 2 (two) times daily. Request jar 453 g 0   Facility-Administered Medications Prior to Visit  Medication Dose Route Frequency Provider Last Rate Last Admin   ipratropium-albuterol (DUONEB) 0.5-2.5 (3) MG/3ML nebulizer solution 3 mL  3 mL Nebulization Q6H Danford, Katy D, NP   3 mL at 12/08/17 0958    Allergies  Allergen Reactions   Codeine Sulfate     REACTION: unspecified   Picato [Ingenol Mebutate] Hives    Review of Systems Review of Systems:  A fourteen system review of systems was performed and found to be positive as per HPI.  Objective:    Physical Exam General:  Well Developed, well nourished, appropriate for stated age.  Neuro:  Alert and oriented,  extra-ocular muscles intact  HEENT:  Normocephalic, atraumatic,no frontal or maxillary sinus tenderness, +mild tenderness of ethmoid sinus b/l, PERRL, normal conjunctiva, normal TM's of both ears with some cerumen non-obstructing view, clear nasal drainage, mild boggy turbinates of left nostril, erythema of posterior oropharynx, neck supple, no adenopathy Skin:  no gross rash, warm, pink. Cardiac:  RRR, S1 S2 Respiratory:  +mild expiratory wheezing, no rhonchi, crackles or rales. Vascular:  Ext warm, no cyanosis apprec.; cap RF less 2 sec. Psych:  No HI/SI, judgement and insight good, Euthymic mood. Full Affect.  BP 134/78    Pulse 80    Temp (!) 97.5 F (36.4 C)    Ht '5\' 4"'  (1.626 m)    Wt 149 lb (67.6 kg)    SpO2 94%    BMI 25.58 kg/m  Wt Readings from Last 3  Encounters:  01/15/21 149 lb (67.6 kg)  12/01/20 149 lb (67.6 kg)  11/28/20 148 lb (67.1 kg)    Health Maintenance Due  Topic Date Due   TETANUS/TDAP  02/25/2018   COVID-19 Vaccine (4 - Booster for Pfizer series) 06/26/2019    There are no preventive care reminders to display for this patient.   Lab Results  Component Value Date   TSH 2.530 11/28/2020  Lab Results  Component Value Date   WBC 6.7 11/28/2020   HGB 13.7 11/28/2020   HCT 42.7 11/28/2020   MCV 95 11/28/2020   PLT 167 11/28/2020   Lab Results  Component Value Date   NA 144 11/28/2020   K 4.1 11/28/2020   CO2 25 11/28/2020   GLUCOSE 93 11/28/2020   BUN 18 11/28/2020   CREATININE 1.17 (H) 11/28/2020   BILITOT 0.4 11/28/2020   ALKPHOS 96 11/28/2020   AST 17 11/28/2020   ALT 10 11/28/2020   PROT 6.3 11/28/2020   ALBUMIN 4.2 11/28/2020   CALCIUM 9.8 11/28/2020   EGFR 44 (L) 11/28/2020   GFR 45.15 (L) 08/01/2015   Lab Results  Component Value Date   CHOL 149 11/28/2020   Lab Results  Component Value Date   HDL 64 11/28/2020   Lab Results  Component Value Date   LDLCALC 70 11/28/2020   Lab Results  Component Value Date   TRIG 75 11/28/2020   Lab Results  Component Value Date   CHOLHDL 2.3 11/28/2020   Lab Results  Component Value Date   HGBA1C 5.5 11/28/2020       Assessment & Plan:   Problem List Items Addressed This Visit       Respiratory   COPD exacerbation ([place])   Relevant Medications   predniSONE (DELTASONE) 20 MG tablet   azithromycin (ZITHROMAX) 250 MG tablet     Other   Cough   Relevant Orders   POCT Influenza A/B (Completed)   POCT respiratory syncytial virus (Completed)   Novel Coronavirus, NAA ([facility]) (Completed)   Other Visit Diagnoses     Upper respiratory tract infection, unspecified type    -  Primary   Relevant Medications   azithromycin (ZITHROMAX) 250 MG tablet      Discussed with patient and daughter likely viral upper respiratory infection.   RSV and influenza test resulted negative, COVID test pending.  Recommend to continue home supportive care for upper respiratory symptoms.  Patient has mild adventitious sounds on exam with recent onset of purulent sputum production and oxygen saturation is 94% which is lower than baseline so will start oral antibiotic and corticosteroid therapy for COPD exacerbation secondary to viral URI. Monitor for worsening symptoms. If symptoms worsen or develops new symptoms such as fever, confusion then recommend to seek immediate medical care by going to the ED. Follow up if symptoms fail to improve or worsen.     Meds ordered this encounter  Medications   predniSONE (DELTASONE) 20 MG tablet    Sig: Take 2 tablets by mouth x 2 days, 1 tablets x 2 days, 0.5 tablet x 2 days    Dispense:  7 tablet    Refill:  0    Order Specific Question:   Supervising Provider    Answer:   Beatrice Lecher D [[id number]]   azithromycin (ZITHROMAX) 250 MG tablet    Sig: Take 2 tablets on day 1, then 1 tablet daily on days 2 through 5    Dispense:  6 tablet    Refill:  0    Order Specific Question:   Supervising Provider    Answer:   Beatrice Lecher D [[id number]]    Note:  This note was prepared with assistance of Dragon voice recognition software. Occasional wrong-word or sound-a-like substitutions may have occurred due to the inherent limitations of voice recognition software.  Lorrene Reid, PA-C

## 2021-01-16 LAB — SARS-COV-2, NAA 2 DAY TAT

## 2021-01-16 LAB — NOVEL CORONAVIRUS, NAA: SARS-CoV-2, NAA: NOT DETECTED

## 2021-01-20 ENCOUNTER — Telehealth: Payer: Self-pay | Admitting: Physician Assistant

## 2021-01-20 NOTE — Telephone Encounter (Signed)
Daughter left message on vm stating mom has taken the prednisone and she does not have a fever however she is getting up thick green flem and wants to know do they need to start the antibiotic that was sent in? [phone]

## 2021-01-20 NOTE — Telephone Encounter (Signed)
Spoke with patient's daughter Ms. [name]. Per Herb Grays, it was okay to start antibiotic and hold off on Mucinex until later this week.

## 2021-01-21 ENCOUNTER — Other Ambulatory Visit: Payer: Self-pay | Admitting: Physician Assistant

## 2021-01-27 ENCOUNTER — Other Ambulatory Visit: Payer: Self-pay | Admitting: Physician Assistant

## 2021-01-27 DIAGNOSIS — J449 Chronic obstructive pulmonary disease, unspecified: Secondary | ICD-10-CM

## 2021-02-04 ENCOUNTER — Other Ambulatory Visit: Payer: Self-pay

## 2021-02-04 ENCOUNTER — Ambulatory Visit (INDEPENDENT_AMBULATORY_CARE_PROVIDER_SITE_OTHER): Payer: Medicare HMO | Admitting: Ophthalmology

## 2021-02-04 ENCOUNTER — Encounter (INDEPENDENT_AMBULATORY_CARE_PROVIDER_SITE_OTHER): Payer: Self-pay | Admitting: Ophthalmology

## 2021-02-04 DIAGNOSIS — H353124 Nonexudative age-related macular degeneration, left eye, advanced atrophic with subfoveal involvement: Secondary | ICD-10-CM | POA: Diagnosis not present

## 2021-02-04 DIAGNOSIS — H353211 Exudative age-related macular degeneration, right eye, with active choroidal neovascularization: Secondary | ICD-10-CM

## 2021-02-04 MED ORDER — BEVACIZUMAB 2.5 MG/0.1ML IZ SOSY
2.5000 mg | PREFILLED_SYRINGE | INTRAVITREAL | Status: AC | PRN
  Administered 2021-02-04: 2.5 mg via INTRAVITREAL

## 2021-02-04 NOTE — Progress Notes (Signed)
02/04/2021     CHIEF COMPLAINT Patient presents for  Chief Complaint  Patient presents with   Retina Follow Up      HISTORY OF PRESENT ILLNESS: Donna Arias is a 88 y.o. female who presents to the clinic today for:   HPI     Retina Follow Up           Diagnosis: Wet AMD   Laterality: right eye   Onset: 5 weeks ago   Severity: mild   Duration: 5 weeks   Course: stable         Comments   5 weeks dilate OD, Avastin OD OCT. Patient states vision is stable and unchanged since last visit. Denies any new floaters or FOL.       Last edited by Laurin Coder on 02/04/2021  2:13 PM.      Referring physician: Lorrene Reid, PA-C [address],  [place] [address]  HISTORICAL INFORMATION:   Selected notes from the MEDICAL RECORD NUMBER    Lab Results  Component Value Date   HGBA1C 5.5 11/28/2020     CURRENT MEDICATIONS: Current Outpatient Medications (Ophthalmic Drugs)  Medication Sig   erythromycin ophthalmic ointment Place 1 application into the left eye at bedtime.   No current facility-administered medications for this visit. (Ophthalmic Drugs)   Current Outpatient Medications (Other)  Medication Sig   albuterol (PROVENTIL) (2.5 MG/3ML) 0.083% nebulizer solution Take 3 mLs (2.5 mg total) by nebulization every 6 (six) hours as needed for wheezing or shortness of breath.   amLODipine (NORVASC) 2.5 MG tablet TAKE 1 TABLET (2.5 MG TOTAL) BY MOUTH DAILY.   aspirin 81 MG tablet Take 81 mg by mouth daily.   atorvastatin (LIPITOR) 10 MG tablet TAKE 1 TABLET BY MOUTH DAILY.   benzonatate (TESSALON) 100 MG capsule Take 1 capsule (100 mg total) by mouth 3 (three) times daily as needed for cough.   budesonide (PULMICORT) 0.25 MG/2ML nebulizer solution Take 2 mLs (0.25 mg total) by nebulization in the morning and at bedtime.   Calcium Carbonate-Vit D-Min (CALCIUM 1200 PO) Take 1 tablet by mouth.   fexofenadine (ALLEGRA) 180 MG tablet Take 180  mg by mouth daily.   FLUoxetine (PROZAC) 10 MG capsule TAKE 1 CAPSULE BY MOUTH DAILY.   fluticasone (FLONASE) 50 MCG/ACT nasal spray Place 1 spray into both nostrils 2 (two) times daily.   furosemide (LASIX) 20 MG tablet TAKE 1 TABLET BY MOUTH DAILY.   ketoconazole (NIZORAL) 2 % shampoo Apply topically 2 (two) times a week.   levothyroxine (SYNTHROID) 25 MCG tablet TAKE 1/2 TABLET BY MOUTH DAILY WITH 50 MCG TO EQUAL 62.5 MCG TOTAL DAILY   levothyroxine (SYNTHROID) 50 MCG tablet TAKE 1 TABLET BY MOUTH EVERY DAY   potassium chloride SA (KLOR-CON) 20 MEQ tablet TAKE 1 TABLET BY MOUTH ONCE DAILY.   predniSONE (DELTASONE) 20 MG tablet Take 2 tablets by mouth x 2 days, 1 tablets x 2 days, 0.5 tablet x 2 days   risperiDONE (RISPERDAL) 1 MG tablet TAKE 1 TABLET BY MOUTH 2 TIMES DAILY.   STIOLTO RESPIMAT 2.5-2.5 MCG/ACT AERS INHALE 2 PUFFS INTO THE LUNGS DAILY.   triamcinolone cream (KENALOG) 0.1 % Apply 1 application topically 2 (two) times daily. Request jar   Current Facility-Administered Medications (Other)  Medication Route   ipratropium-albuterol (DUONEB) 0.5-2.5 (3) MG/3ML nebulizer solution 3 mL Nebulization      REVIEW OF SYSTEMS: ROS   Negative for: Constitutional, Gastrointestinal, Neurological,  Skin, Genitourinary, Musculoskeletal, HENT, Endocrine, Cardiovascular, Eyes, Respiratory, Psychiatric, Allergic/Imm, Heme/Lymph Last edited by Hurman Horn, MD on 02/04/2021  2:38 PM.       ALLERGIES Allergies  Allergen Reactions   Codeine Sulfate     REACTION: unspecified   Picato [Ingenol Mebutate] Hives    PAST MEDICAL HISTORY Past Medical History:  Diagnosis Date   Chronic headaches    COPD (chronic obstructive pulmonary disease) ([place])    Dementia ([place])    Eye infection, left 01/16/2018   GERD (gastroesophageal reflux disease)    Heart attack ([place]) 1998   HLD (hyperlipidemia)    Hypertension    Skin cancer    squamous cell of left face   Stroke ([place])    Thyroid disease     Past Surgical History:  Procedure Laterality Date   BREAST LUMPECTOMY Left 1995   DILATION AND CURETTAGE OF [place] HISTORY Family History  Problem Relation Age of Onset   Hypertension Mother    Stroke Mother    Stroke Sister    Alzheimer's disease Sister    Stroke Sister    Hypertension Sister    Stroke Sister    Breast cancer Sister     SOCIAL HISTORY Social History   Tobacco Use   Smoking status: Former    Types: Cigarettes    Quit date: 1995    Years since quitting: 28.0   Smokeless tobacco: Never  Vaping Use   Vaping Use: Never used  Substance Use Topics   Alcohol use: No   Drug use: No         OPHTHALMIC EXAM:  Base Eye Exam     Visual Acuity (ETDRS)       Right Left   Dist cc 20/50 -1 CF at 3'   Dist ph cc NI NI    Correction: Glasses         Tonometry (Tonopen, 2:18 PM)       Right Left   Pressure 13 15         Pupils       Pupils Dark Light APD   Right PERRL 4 3 None   Left PERRL 4 3 +1         Visual Fields       Left Right     Full   Restrictions Partial inner superior temporal, inferior temporal, superior nasal, inferior nasal deficiencies          Extraocular Movement       Right Left    Full Full         Neuro/Psych     Oriented x3: Yes   Mood/Affect: Normal         Dilation     Both eyes: 1.0% Mydriacyl, 2.5% Phenylephrine @ 2:18 PM           Slit Lamp and Fundus Exam     External Exam       Right Left   External Normal Normal         Slit Lamp Exam       Right Left   Lids/Lashes Normal Normal   Conjunctiva/Sclera White and quiet White and quiet   Cornea Clear Clear   Anterior Chamber Deep and quiet Deep and quiet   Iris Round and reactive Round and reactive   Lens Centered posterior chamber intraocular lens Centered posterior chamber intraocular lens   Anterior Vitreous Normal Normal  Fundus Exam       Right Left    Posterior Vitreous Posterior vitreous detachment Posterior vitreous detachment   Disc Peripapillary atrophy Peripapillary atrophy   C/D Ratio 0.65 0.6   Macula Retinal pigment epithelial mottling, no macular thickening, Retinal atrophy, Early age related macular degeneration, no serous retinal detachment visible clinically, minor epiretinal membrane not distorting Advanced age related macular degeneration, Geographic atrophy 1.5 - 2.0 disc areas foveal atrophy, no macular thickening, no hemorrhage   Vessels Normal Normal   Periphery Normal Normal            IMAGING AND PROCEDURES  Imaging and Procedures for 02/04/21  OCT, Retina - OU - Both Eyes       Right Eye Quality was good. Scan locations included subfoveal. Central Foveal Thickness: 278. Progression has improved. Findings include abnormal foveal contour.   Left Eye Quality was good. Scan locations included nasal. Central Foveal Thickness: 132. Progression has been stable. Findings include abnormal foveal contour, inner retinal atrophy, central retinal atrophy, outer retinal atrophy.   Notes Less subretinal fluid and CME perifoveal at 5-week interval.  OD.  We will repeat injection Avastin OD today and examination next in 5 weeks OS with macular atrophy from previous retinal vascular disease.  Stable overall     Intravitreal Injection, Pharmacologic Agent - OD - Right Eye       Time Out 02/04/2021. 2:41 PM. Confirmed correct patient, procedure, site, and patient consented.   Anesthesia Topical anesthesia was used. Anesthetic medications included Lidocaine 4%.   Procedure Preparation included Ofloxacin , Tobramycin 0.3%, 10% betadine to eyelids, 5% betadine to ocular surface. A 30 gauge needle was used.   Injection: 2.5 mg bevacizumab 2.5 MG/0.1ML   Route: Intravitreal, Site: Right Eye   NDC: [phone], Lot: [id number]   Post-op Post injection exam found visual acuity of at least counting fingers. The patient  tolerated the procedure well. There were no complications. The patient received written and verbal post procedure care education. Post injection medications included ocuflox.              ASSESSMENT/PLAN:  Exudative age-related macular degeneration of right eye with active choroidal neovascularization (HCC) The nature of wet macular degeneration was discussed with the patient.  Forms of therapy reviewed include the use of Anti-VEGF medications injected painlessly into the eye, as well as other possible treatment modalities, including thermal laser therapy. Fellow eye involvement and risks were discussed with the patient. Upon the finding of wet age related macular degeneration, treatment will be offered. The treatment regimen is on a treat as needed basis with the intent to treat if necessary and extend interval of exams when possible. On average 1 out of 6 patients do not need lifetime therapy. However, the risk of recurrent disease is high for a lifetime.  Initially monthly, then periodic, examinations and evaluations will determine whether the next treatment is required on the day of the examination.  OD vastly improved macular anatomy at shorter interval 5 weeks as compared to 7 weeks previous.  Repeat injection Avastin today and maintain 5-week interval OD  Advanced nonexudative age-related macular degeneration of left eye with subfoveal involvement Atrophy OS accounts for acuity     ICD-10-CM   1. Exudative age-related macular degeneration of right eye with active choroidal neovascularization (HCC)  H35.3211 OCT, Retina - OU - Both Eyes    Intravitreal Injection, Pharmacologic Agent - OD - Right Eye    bevacizumab (AVASTIN) SOSY 2.5 mg  2. Advanced nonexudative age-related macular degeneration of left eye with subfoveal involvement  H35.3124       1.  OD vastly improved macular anatomy at 5-week shorter interval compared to 7 weeks previous, repeat Avastin injection today and  maintain 5-week interval OD  2.  No sign of active CNVM OS, atrophy accounts for acuity  3.  Ophthalmic Meds Ordered this visit:  Meds ordered this encounter  Medications   bevacizumab (AVASTIN) SOSY 2.5 mg       Return in about 5 weeks (around 03/11/2021) for dilate, OD, AVASTIN OCT.  There are no Patient Instructions on file for this visit.   Explained the diagnoses, plan, and follow up with the patient and they expressed understanding.  Patient expressed understanding of the importance of proper follow up care.   Clent Demark Jari Carollo M.D. Diseases & Surgery of the Retina and Vitreous Retina [facility] Diabetic [place] 02/04/21     Abbreviations: M myopia (nearsighted); A astigmatism; H hyperopia (farsighted); P presbyopia; Mrx spectacle prescription;  CTL contact lenses; OD right eye; OS left eye; OU both eyes  XT exotropia; ET esotropia; PEK punctate epithelial keratitis; PEE punctate epithelial erosions; DES dry eye syndrome; MGD meibomian gland dysfunction; ATs artificial tears; PFAT's preservative free artificial tears; [place] nuclear sclerotic cataract; PSC posterior subcapsular cataract; ERM epi-retinal membrane; PVD posterior vitreous detachment; RD retinal detachment; DM diabetes mellitus; DR diabetic retinopathy; NPDR non-proliferative diabetic retinopathy; PDR proliferative diabetic retinopathy; CSME clinically significant macular edema; DME diabetic macular edema; dbh dot blot hemorrhages; CWS cotton wool spot; POAG primary open angle glaucoma; C/D cup-to-disc ratio; HVF humphrey visual field; GVF goldmann visual field; OCT optical coherence tomography; IOP intraocular pressure; BRVO Branch retinal vein occlusion; CRVO central retinal vein occlusion; CRAO central retinal artery occlusion; BRAO branch retinal artery occlusion; RT retinal tear; SB scleral buckle; PPV pars plana vitrectomy; VH Vitreous hemorrhage; PRP panretinal laser photocoagulation; IVK intravitreal kenalog; VMT  vitreomacular traction; MH Macular hole;  NVD neovascularization of the disc; NVE neovascularization elsewhere; AREDS age related eye disease study; ARMD age related macular degeneration; POAG primary open angle glaucoma; EBMD epithelial/anterior basement membrane dystrophy; ACIOL anterior chamber intraocular lens; IOL intraocular lens; PCIOL posterior chamber intraocular lens; Phaco/IOL phacoemulsification with intraocular lens placement; [place] photorefractive keratectomy; LASIK laser assisted in situ keratomileusis; HTN hypertension; DM diabetes mellitus; COPD chronic obstructive pulmonary disease

## 2021-02-04 NOTE — Assessment & Plan Note (Signed)
The nature of wet macular degeneration was discussed with the patient.  Forms of therapy reviewed include the use of Anti-VEGF medications injected painlessly into the eye, as well as other possible treatment modalities, including thermal laser therapy. Fellow eye involvement and risks were discussed with the patient. Upon the finding of wet age related macular degeneration, treatment will be offered. The treatment regimen is on a treat as needed basis with the intent to treat if necessary and extend interval of exams when possible. On average 1 out of 6 patients do not need lifetime therapy. However, the risk of recurrent disease is high for a lifetime.  Initially monthly, then periodic, examinations and evaluations will determine whether the next treatment is required on the day of the examination.  OD vastly improved macular anatomy at shorter interval 5 weeks as compared to 7 weeks previous.  Repeat injection Avastin today and maintain 5-week interval OD

## 2021-02-04 NOTE — Assessment & Plan Note (Signed)
Atrophy OS accounts for acuity

## 2021-02-05 ENCOUNTER — Encounter (INDEPENDENT_AMBULATORY_CARE_PROVIDER_SITE_OTHER): Payer: Medicare HMO | Admitting: Ophthalmology

## 2021-02-16 ENCOUNTER — Other Ambulatory Visit: Payer: Self-pay | Admitting: Physician Assistant

## 2021-02-16 DIAGNOSIS — Z760 Encounter for issue of repeat prescription: Secondary | ICD-10-CM

## 2021-03-11 ENCOUNTER — Encounter (INDEPENDENT_AMBULATORY_CARE_PROVIDER_SITE_OTHER): Payer: Self-pay | Admitting: Ophthalmology

## 2021-03-11 ENCOUNTER — Ambulatory Visit (INDEPENDENT_AMBULATORY_CARE_PROVIDER_SITE_OTHER): Payer: Medicare HMO | Admitting: Ophthalmology

## 2021-03-11 ENCOUNTER — Other Ambulatory Visit: Payer: Self-pay

## 2021-03-11 DIAGNOSIS — H353222 Exudative age-related macular degeneration, left eye, with inactive choroidal neovascularization: Secondary | ICD-10-CM

## 2021-03-11 DIAGNOSIS — H353124 Nonexudative age-related macular degeneration, left eye, advanced atrophic with subfoveal involvement: Secondary | ICD-10-CM | POA: Diagnosis not present

## 2021-03-11 DIAGNOSIS — H353211 Exudative age-related macular degeneration, right eye, with active choroidal neovascularization: Secondary | ICD-10-CM

## 2021-03-11 MED ORDER — BEVACIZUMAB 2.5 MG/0.1ML IZ SOSY
2.5000 mg | PREFILLED_SYRINGE | INTRAVITREAL | Status: AC | PRN
  Administered 2021-03-11: 2.5 mg via INTRAVITREAL

## 2021-03-11 NOTE — Assessment & Plan Note (Signed)
Atrophy accounts for vision

## 2021-03-11 NOTE — Assessment & Plan Note (Signed)
OD vastly improved and stable condition and acuity currently at 5-week follow-up interval for intravitreal Avastin.  We will repeat injection today and attempt extended interval next to 6 weeks

## 2021-03-11 NOTE — Progress Notes (Signed)
03/11/2021     CHIEF COMPLAINT Patient presents for  Chief Complaint  Patient presents with   Macular Degeneration      HISTORY OF PRESENT ILLNESS: Donna Arias is a 88 y.o. female who presents to the clinic today for:   HPI   5 weeks dilate OD, Avastin OD OCT. Patient states vision is stable and unchanged since last visit. Denies any new floaters or FOL.   Last edited by Laurin Coder on 03/11/2021  2:42 PM.      Referring physician: Lorrene Reid, PA-C [address] [place],  [place] [address]  HISTORICAL INFORMATION:   Selected notes from the MEDICAL RECORD NUMBER    Lab Results  Component Value Date   HGBA1C 5.5 11/28/2020     CURRENT MEDICATIONS: Current Outpatient Medications (Ophthalmic Drugs)  Medication Sig   erythromycin ophthalmic ointment Place 1 application into the left eye at bedtime.   No current facility-administered medications for this visit. (Ophthalmic Drugs)   Current Outpatient Medications (Other)  Medication Sig   albuterol (PROVENTIL) (2.5 MG/3ML) 0.083% nebulizer solution Take 3 mLs (2.5 mg total) by nebulization every 6 (six) hours as needed for wheezing or shortness of breath.   amLODipine (NORVASC) 2.5 MG tablet TAKE 1 TABLET (2.5 MG TOTAL) BY MOUTH DAILY.   aspirin 81 MG tablet Take 81 mg by mouth daily.   atorvastatin (LIPITOR) 10 MG tablet TAKE 1 TABLET BY MOUTH DAILY.   benzonatate (TESSALON) 100 MG capsule Take 1 capsule (100 mg total) by mouth 3 (three) times daily as needed for cough.   budesonide (PULMICORT) 0.25 MG/2ML nebulizer solution Take 2 mLs (0.25 mg total) by nebulization in the morning and at bedtime.   Calcium Carbonate-Vit D-Min (CALCIUM 1200 PO) Take 1 tablet by mouth.   fexofenadine (ALLEGRA) 180 MG tablet Take 180 mg by mouth daily.   FLUoxetine (PROZAC) 10 MG capsule TAKE 1 CAPSULE BY MOUTH DAILY.   fluticasone (FLONASE) 50 MCG/ACT nasal spray Place 1 spray into both nostrils 2 (two) times  daily.   furosemide (LASIX) 20 MG tablet TAKE 1 TABLET BY MOUTH DAILY.   ketoconazole (NIZORAL) 2 % shampoo Apply topically 2 (two) times a week.   levothyroxine (SYNTHROID) 25 MCG tablet TAKE 1/2 TABLET BY MOUTH DAILY WITH 50 MCG TO EQUAL 62.5 MCG TOTAL DAILY   levothyroxine (SYNTHROID) 50 MCG tablet TAKE 1 TABLET BY MOUTH EVERY DAY   potassium chloride SA (KLOR-CON) 20 MEQ tablet TAKE 1 TABLET BY MOUTH ONCE DAILY.   predniSONE (DELTASONE) 20 MG tablet Take 2 tablets by mouth x 2 days, 1 tablets x 2 days, 0.5 tablet x 2 days   risperiDONE (RISPERDAL) 1 MG tablet TAKE 1 TABLET BY MOUTH 2 TIMES DAILY.   STIOLTO RESPIMAT 2.5-2.5 MCG/ACT AERS INHALE 2 PUFFS INTO THE LUNGS DAILY.   triamcinolone cream (KENALOG) 0.1 % Apply 1 application topically 2 (two) times daily. Request jar   Current Facility-Administered Medications (Other)  Medication Route   ipratropium-albuterol (DUONEB) 0.5-2.5 (3) MG/3ML nebulizer solution 3 mL Nebulization      REVIEW OF SYSTEMS: ROS   Negative for: Constitutional, Gastrointestinal, Neurological, Skin, Genitourinary, Musculoskeletal, HENT, Endocrine, Cardiovascular, Eyes, Respiratory, Psychiatric, Allergic/Imm, Heme/Lymph Last edited by Hurman Horn, MD on 03/11/2021  3:09 PM.       ALLERGIES Allergies  Allergen Reactions   Codeine Sulfate     REACTION: unspecified   Picato [Ingenol Mebutate] Hives    PAST MEDICAL HISTORY Past Medical  History:  Diagnosis Date   Chronic headaches    COPD (chronic obstructive pulmonary disease) (HCC)    Dementia (HCC)    Eye infection, left 01/16/2018   GERD (gastroesophageal reflux disease)    Heart attack ([place] [place]) 1998   HLD (hyperlipidemia)    Hypertension    Skin cancer    squamous cell of left face   Stroke ([place])    Thyroid disease    Past Surgical History:  Procedure Laterality Date   BREAST LUMPECTOMY Left 1995   DILATION AND CURETTAGE OF UTERUS  1975   GALLBLADDER SURGERY  1993    FAMILY  HISTORY Family History  Problem Relation Age of Onset   Hypertension Mother    Stroke Mother    Stroke Sister    Alzheimer's disease Sister    Stroke Sister    Hypertension Sister    Stroke Sister    Breast cancer Sister     SOCIAL HISTORY Social History   Tobacco Use   Smoking status: Former    Types: Cigarettes    Quit date: 1995    Years since quitting: 28.1   Smokeless tobacco: Never  Vaping Use   Vaping Use: Never used  Substance Use Topics   Alcohol use: No   Drug use: No         OPHTHALMIC EXAM:  Base Eye Exam     Visual Acuity (ETDRS)       Right Left   Dist cc 20/50 CF at 3'   Dist ph cc  NI    Correction: Glasses         Tonometry (Tonopen, 2:40 PM)       Right Left   Pressure 10 10         Pupils       Pupils Dark Light APD   Right PERRL 4 3 None   Left PERRL 4 3 +1         Extraocular Movement       Right Left    Full Full         Neuro/Psych     Oriented x3: Yes   Mood/Affect: Normal         Dilation     Both eyes: 1.0% Mydriacyl, 2.5% Phenylephrine @ 2:40 PM           Slit Lamp and Fundus Exam     External Exam       Right Left   External Normal Normal         Slit Lamp Exam       Right Left   Lids/Lashes Normal Normal   Conjunctiva/Sclera White and quiet White and quiet   Cornea Clear Clear   Anterior Chamber Deep and quiet Deep and quiet   Iris Round and reactive Round and reactive   Lens Centered posterior chamber intraocular lens Centered posterior chamber intraocular lens   Anterior Vitreous Normal Normal         Fundus Exam       Right Left   Posterior Vitreous Posterior vitreous detachment Posterior vitreous detachment   Disc Peripapillary atrophy Peripapillary atrophy   C/D Ratio 0.65 0.6   Macula Retinal pigment epithelial mottling, no macular thickening, Retinal atrophy, Early age related macular degeneration, no serous retinal detachment visible clinically, minor epiretinal  membrane not distorting Advanced age related macular degeneration, Geographic atrophy 1.5 - 2.0 disc areas foveal atrophy, no macular thickening, no hemorrhage   Vessels Normal Normal   Periphery  Normal Normal            IMAGING AND PROCEDURES  Imaging and Procedures for 03/11/21  OCT, Retina - OU - Both Eyes       Right Eye Quality was good. Scan locations included subfoveal. Central Foveal Thickness: 278. Progression has improved. Findings include abnormal foveal contour.   Left Eye Quality was good. Scan locations included nasal. Central Foveal Thickness: 98. Progression has been stable. Findings include abnormal foveal contour, inner retinal atrophy, central retinal atrophy, outer retinal atrophy.   Notes Less subretinal fluid and CME perifoveal at 5-week interval.  OD.  We will repeat injection Avastin OD today and examination next in 6 weeks OS with macular atrophy from previous retinal vascular disease.  Stable overall     Intravitreal Injection, Pharmacologic Agent - OD - Right Eye       Time Out 03/11/2021. 3:07 PM. Confirmed correct patient, procedure, site, and patient consented.   Anesthesia Topical anesthesia was used. Anesthetic medications included Lidocaine 4%.   Procedure Preparation included Ofloxacin , Tobramycin 0.3%, 10% betadine to eyelids, 5% betadine to ocular surface. A 30 gauge needle was used.   Injection: 2.5 mg bevacizumab 2.5 MG/0.1ML   Route: Intravitreal, Site: Right Eye   NDC: [phone], Lot: [id number]   Post-op Post injection exam found visual acuity of at least counting fingers. The patient tolerated the procedure well. There were no complications. The patient received written and verbal post procedure care education. Post injection medications included ocuflox.              ASSESSMENT/PLAN:  Exudative age-related macular degeneration of right eye with active choroidal neovascularization ([place]) OD vastly improved and stable  condition and acuity currently at 5-week follow-up interval for intravitreal Avastin.  We will repeat injection today and attempt extended interval next to 6 weeks  Exudative age-related macular degeneration of left eye with inactive choroidal neovascularization (HCC) OS stable over time  Advanced nonexudative age-related macular degeneration of left eye with subfoveal involvement Atrophy accounts for vision     ICD-10-CM   1. Exudative age-related macular degeneration of right eye with active choroidal neovascularization (HCC)  H35.3211 OCT, Retina - OU - Both Eyes    Intravitreal Injection, Pharmacologic Agent - OD - Right Eye    bevacizumab (AVASTIN) SOSY 2.5 mg    2. Exudative age-related macular degeneration of left eye with inactive choroidal neovascularization ([place])  H35.3222     3. Advanced nonexudative age-related macular degeneration of left eye with subfoveal involvement  H35.3124       OD vastly improved macular anatomy and stable since onset of disease November 2021.  Now at 5-week interval post Avastin.  We will repeat injection today and extend interval examination next to 6 weeks  2.  OS diffuse atrophy accounts for vision  3.  Ophthalmic Meds Ordered this visit:  Meds ordered this encounter  Medications   bevacizumab (AVASTIN) SOSY 2.5 mg       Return in about 6 weeks (around 04/22/2021) for dilate, OD, AVASTIN OCT.  There are no Patient Instructions on file for this visit.   Explained the diagnoses, plan, and follow up with the patient and they expressed understanding.  Patient expressed understanding of the importance of proper follow up care.   Clent Demark Ravina Milner M.D. Diseases & Surgery of the Retina and Vitreous [facility] [place] 03/11/21     Abbreviations: M myopia (nearsighted); A astigmatism; H hyperopia (farsighted); P presbyopia; Mrx spectacle prescription;  CTL contact lenses; OD right eye; OS left eye; OU both eyes  XT exotropia; ET  esotropia; PEK punctate epithelial keratitis; PEE punctate epithelial erosions; DES dry eye syndrome; MGD meibomian gland dysfunction; ATs artificial tears; PFAT's preservative free artificial tears; [place] nuclear sclerotic cataract; PSC posterior subcapsular cataract; ERM epi-retinal membrane; PVD posterior vitreous detachment; RD retinal detachment; DM diabetes mellitus; DR diabetic retinopathy; NPDR non-proliferative diabetic retinopathy; PDR proliferative diabetic retinopathy; CSME clinically significant macular edema; DME diabetic macular edema; dbh dot blot hemorrhages; CWS cotton wool spot; POAG primary open angle glaucoma; C/D cup-to-disc ratio; HVF humphrey visual field; GVF goldmann visual field; OCT optical coherence tomography; IOP intraocular pressure; BRVO Branch retinal vein occlusion; CRVO central retinal vein occlusion; CRAO central retinal artery occlusion; BRAO branch retinal artery occlusion; RT retinal tear; SB scleral buckle; PPV pars plana vitrectomy; VH Vitreous hemorrhage; PRP panretinal laser photocoagulation; IVK intravitreal kenalog; VMT vitreomacular traction; MH Macular hole;  NVD neovascularization of the disc; NVE neovascularization elsewhere; AREDS age related eye disease study; ARMD age related macular degeneration; POAG primary open angle glaucoma; EBMD epithelial/anterior basement membrane dystrophy; ACIOL anterior chamber intraocular lens; IOL intraocular lens; PCIOL posterior chamber intraocular lens; Phaco/IOL phacoemulsification with intraocular lens placement; [place] photorefractive keratectomy; LASIK laser assisted in situ keratomileusis; HTN hypertension; DM diabetes mellitus; COPD chronic obstructive pulmonary disease

## 2021-03-11 NOTE — Assessment & Plan Note (Signed)
OS stable over time

## 2021-03-16 ENCOUNTER — Telehealth: Payer: Self-pay | Admitting: Physician Assistant

## 2021-03-16 NOTE — Telephone Encounter (Signed)
close

## 2021-03-17 ENCOUNTER — Ambulatory Visit (INDEPENDENT_AMBULATORY_CARE_PROVIDER_SITE_OTHER): Payer: Medicare HMO | Admitting: Physician Assistant

## 2021-03-17 ENCOUNTER — Encounter: Payer: Self-pay | Admitting: Physician Assistant

## 2021-03-17 ENCOUNTER — Other Ambulatory Visit: Payer: Self-pay

## 2021-03-17 VITALS — BP 119/78 | HR 68 | Temp 97.2°F | Ht 64.0 in | Wt 149.0 lb

## 2021-03-17 DIAGNOSIS — R21 Rash and other nonspecific skin eruption: Secondary | ICD-10-CM | POA: Diagnosis not present

## 2021-03-17 MED ORDER — NYSTATIN 100000 UNIT/GM EX POWD: 1.0000 "application " | Freq: Two times a day (BID) | CUTANEOUS | 2 refills | Status: DC

## 2021-03-17 MED ORDER — MUPIROCIN 2 % EX OINT: TOPICAL_OINTMENT | Freq: Two times a day (BID) | CUTANEOUS | 0 refills | Status: DC

## 2021-03-17 NOTE — Progress Notes (Signed)
Established patient acute visit   Patient: Donna Arias   DOB: 1927-01-08   88 y.o. Female  MRN: [id number] Visit Date: 03/17/2021  Chief Complaint  Patient presents with   Acute Visit   Subjective    HPI  Patient presents with c/o skin irritation and redness in the groin area, mostly left. Patient is accompanied by her daughter who is her caregiver. States has tried zinc cream, dermacloud and miconazole cream. Alternating between zinc and miconazole. Has been using miconazole for the past 2 weeks. The past 3-4 days has used neosporin for 3 small red lesions. Sometime does itch in the groin area. No tenderness. No fever, chills or night sweats. Patient does wear adult briefs.    Medications: Outpatient Medications Prior to Visit  Medication Sig   albuterol (PROVENTIL) (2.5 MG/3ML) 0.083% nebulizer solution Take 3 mLs (2.5 mg total) by nebulization every 6 (six) hours as needed for wheezing or shortness of breath.   amLODipine (NORVASC) 2.5 MG tablet TAKE 1 TABLET (2.5 MG TOTAL) BY MOUTH DAILY.   aspirin 81 MG tablet Take 81 mg by mouth daily.   atorvastatin (LIPITOR) 10 MG tablet TAKE 1 TABLET BY MOUTH DAILY.   benzonatate (TESSALON) 100 MG capsule Take 1 capsule (100 mg total) by mouth 3 (three) times daily as needed for cough.   budesonide (PULMICORT) 0.25 MG/2ML nebulizer solution Take 2 mLs (0.25 mg total) by nebulization in the morning and at bedtime.   Calcium Carbonate-Vit D-Min (CALCIUM 1200 PO) Take 1 tablet by mouth.   erythromycin ophthalmic ointment Place 1 application into the left eye at bedtime.   fexofenadine (ALLEGRA) 180 MG tablet Take 180 mg by mouth daily.   FLUoxetine (PROZAC) 10 MG capsule TAKE 1 CAPSULE BY MOUTH DAILY.   fluticasone (FLONASE) 50 MCG/ACT nasal spray Place 1 spray into both nostrils 2 (two) times daily.   furosemide (LASIX) 20 MG tablet TAKE 1 TABLET BY MOUTH DAILY.   ketoconazole (NIZORAL) 2 % shampoo Apply topically 2 (two) times a week.    levothyroxine (SYNTHROID) 25 MCG tablet TAKE 1/2 TABLET BY MOUTH DAILY WITH 50 MCG TO EQUAL 62.5 MCG TOTAL DAILY   levothyroxine (SYNTHROID) 50 MCG tablet TAKE 1 TABLET BY MOUTH EVERY DAY   potassium chloride SA (KLOR-CON) 20 MEQ tablet TAKE 1 TABLET BY MOUTH ONCE DAILY.   predniSONE (DELTASONE) 20 MG tablet Take 2 tablets by mouth x 2 days, 1 tablets x 2 days, 0.5 tablet x 2 days   risperiDONE (RISPERDAL) 1 MG tablet TAKE 1 TABLET BY MOUTH 2 TIMES DAILY.   STIOLTO RESPIMAT 2.5-2.5 MCG/ACT AERS INHALE 2 PUFFS INTO THE LUNGS DAILY.   triamcinolone cream (KENALOG) 0.1 % Apply 1 application topically 2 (two) times daily. Request jar   Facility-Administered Medications Prior to Visit  Medication Dose Route Frequency Provider   ipratropium-albuterol (DUONEB) 0.5-2.5 (3) MG/3ML nebulizer solution 3 mL  3 mL Nebulization Q6H Danford, Katy D, NP    Review of Systems Review of Systems:  A fourteen system review of systems was performed and found to be positive as per HPI.     Objective    BP 119/78    Pulse 68    Temp (!) 97.2 F (36.2 C)    Ht 5\' 4"  (1.626 m)    Wt 149 lb (67.6 kg)    SpO2 98%    BMI 25.58 kg/m    Physical Exam  General:  Well Developed, well nourished, appropriate for stated age.  Neuro:  Alert and oriented,  extra-ocular muscles intact  HEENT:  Normocephalic, atraumatic, neck supple  Skin:  mild erythema and rawness of left groin area with 3 small skin lesions with scab formation and erythematous base. Cardiac:  RRR, S1 S2 Respiratory: CTA B/L  Vascular:  Ext warm, no cyanosis apprec.; cap RF less 2 sec. Psych:  No HI/SI, judgement and insight good, Euthymic mood. Full Affect.   No results found for any visits on 03/17/21.  Assessment & Plan     Patient has s/sx suggestive of intertrigo with secondary bacterial skin infection so will start topical antibiotic therapy. Patient has tried and failed several barrier creams so will trial nystatin powder, advised  alternative if nystatin ineffective is cornstarch. Discussed aeration and daily cleansing. If symptoms fail to improve or worsen will consider low dose of oral anti-fungal (fluconazole 50-100 mg daily x 7 days).   Return if symptoms worsen or fail to improve.        Lorrene Reid, PA-C  [facility] [phone] (phone) [phone] (fax)  [place]

## 2021-03-17 NOTE — Patient Instructions (Signed)
Intertrigo °Intertrigo is skin irritation (inflammation) that happens in warm, moist areas of the body. The irritation can cause a rash and make skin raw and itchy. The rash is usually pink or red. It happens mostly between folds of skin or where skin rubs together, such as: °Between the toes. °In the armpits. °In the groin area. °Under the belly. °Under the breasts. °Around the butt area. °This condition is not passed from person to person (is not contagious). °What are the causes? °Heat, moisture, rubbing, and not enough air movement. °The condition can be made worse by: °Sweat. °Bacteria. °A fungus, such as yeast. °What increases the risk? °Moisture in your skin folds. °You are more likely to develop this condition if you: °Have diabetes. °Are overweight. °Are not able to move around. °Live in a warm and moist climate. °Wear splints, braces, or other medical devices. °Are not able to control your pee (urine) or poop (stool). °What are the signs or symptoms? °A pink or red skin rash in the skin fold or near the skin fold. °Raw or scaly skin. °Itching. °A burning feeling. °Bleeding. °Leaking fluid. °A bad smell. °How is this treated? °Cleaning and drying your skin. °Taking an antibiotic medicine or using an antibiotic skin cream for a bacterial infection. °Using an antifungal cream on your skin or taking pills for an infection that was caused by a fungus, such as yeast. °Using a steroid ointment to stop the itching and irritation. °Separating the skin fold with a clean cotton cloth to absorb moisture and allow air to flow into the area. °Follow these instructions at home: °Keep the affected area clean and dry. °Do not scratch your skin. °Stay cool as much as you can. Use an air conditioner or a fan, if you have one. °Apply over-the-counter and prescription medicines only as told by your doctor. °If you were prescribed an antibiotic medicine, use it as told by your doctor. Do not stop using the antibiotic even if  your condition starts to get better. °Keep all follow-up visits as told by your doctor. This is important. °How is this prevented? ° °Stay at a healthy weight. °Take care of your feet. This is very important if you have diabetes. You should: °Wear shoes that fit well. °Keep your feet dry. °Wear clean cotton or wool socks. °Protect the skin in your groin and butt area as told by your doctor. To do this: °Follow a regular cleaning routine. °Use creams, powders, or ointments that protect your skin. °Change protection pads often. °Do not wear tight clothes. Wear clothes that: °Are loose. °Take moisture away from your body. °Are made of cotton. °Wear a bra that gives good support, if needed. °Shower and dry yourself well after being active. Use a hair dryer on a cool setting to dry between skin folds. °Keep your blood sugar under control if you have diabetes. °Contact a doctor if: °Your symptoms do not get better with treatment. °Your symptoms get worse or they spread. °You notice more redness and warmth. °You have a fever. °Summary °Intertrigo is skin irritation that occurs when folds of skin rub together. °This condition is caused by heat, moisture, and rubbing. °This condition may be treated by cleaning and drying your skin and with medicines. °Apply over-the-counter and prescription medicines only as told by your doctor. °Keep all follow-up visits as told by your doctor. This is important. °This information is not intended to replace advice given to you by your health care provider. Make sure you discuss   any questions you have with your health care provider. °Document Revised: 10/20/2017 Document Reviewed: 10/20/2017 °Elsevier Patient Education © 2022 [employer] ° °

## 2021-03-19 ENCOUNTER — Ambulatory Visit: Payer: Medicare HMO | Admitting: Dermatology

## 2021-03-23 ENCOUNTER — Other Ambulatory Visit: Payer: Self-pay | Admitting: Physician Assistant

## 2021-03-24 ENCOUNTER — Telehealth: Payer: Self-pay | Admitting: Physician Assistant

## 2021-03-24 DIAGNOSIS — R059 Cough, unspecified: Secondary | ICD-10-CM

## 2021-03-24 MED ORDER — BENZONATATE 100 MG PO CAPS: 100.0000 mg | ORAL_CAPSULE | Freq: Three times a day (TID) | ORAL | 1 refills | Status: AC | PRN

## 2021-03-24 NOTE — Telephone Encounter (Signed)
Patient is requesting refill of tessalon pearls. Please advise. [phone]

## 2021-03-24 NOTE — Telephone Encounter (Signed)
Rx sent to pharmacy. Patient made aware. AS, CMA

## 2021-03-26 ENCOUNTER — Encounter: Payer: Self-pay | Admitting: Physician Assistant

## 2021-03-26 ENCOUNTER — Other Ambulatory Visit: Payer: Self-pay

## 2021-03-26 ENCOUNTER — Ambulatory Visit (INDEPENDENT_AMBULATORY_CARE_PROVIDER_SITE_OTHER): Payer: Medicare HMO | Admitting: Physician Assistant

## 2021-03-26 VITALS — BP 137/79 | HR 76 | Ht 64.0 in | Wt 149.0 lb

## 2021-03-26 DIAGNOSIS — N390 Urinary tract infection, site not specified: Secondary | ICD-10-CM

## 2021-03-26 DIAGNOSIS — R319 Hematuria, unspecified: Secondary | ICD-10-CM

## 2021-03-26 DIAGNOSIS — R112 Nausea with vomiting, unspecified: Secondary | ICD-10-CM | POA: Diagnosis not present

## 2021-03-26 DIAGNOSIS — R4182 Altered mental status, unspecified: Secondary | ICD-10-CM | POA: Diagnosis not present

## 2021-03-26 LAB — POCT URINALYSIS DIP (CLINITEK)
Bilirubin, UA: NEGATIVE
Glucose, UA: NEGATIVE mg/dL
Ketones, POC UA: NEGATIVE mg/dL
Nitrite, UA: POSITIVE — AB
Spec Grav, UA: 1.02 (ref 1.010–1.025)
Urobilinogen, UA: 0.2 E.U./dL
pH, UA: 5.5 (ref 5.0–8.0)

## 2021-03-26 NOTE — Addendum Note (Signed)
Addended by: Vivia Birmingham on: 03/26/2021 03:20 PM ? ? Modules accepted: Orders ? ?

## 2021-03-26 NOTE — Progress Notes (Signed)
Telehealth office visit note for Donna Reid, PA-C- at Primary Care at [facility]   I connected with current patient today by telephone and verified that I am speaking with the correct person    Location of the patient: Home  Location of the provider: Office - This visit type was conducted due to national recommendations for restrictions regarding the COVID-19 Pandemic (e.g. social distancing) in an effort to limit this patient's exposure and mitigate transmission in our community.    - No physical exam could be performed with this format, beyond that communicated to [name] by the patient/ family members as noted.   - Additionally my office staff/ schedulers were to discuss with the patient that there may be a monetary charge related to this service, depending on their medical insurance.  My understanding is that patient understood and consented to proceed.     _________________________________________________________________________________   History of Present Illness: Patient calls in with c/o of possible UTI. Patient's daughter [name] is also on the line providing the history. Patient's daughter states this morning patient has been sleeping, felt nauseated and had 1x vomiting episode. Reports has been able to keep solids and liquids down. No prior nausea or vomiting other than today. Tested for Covid yesterday which was negative. Reports patient has had some confusion episodes and hallucinations. Feels weak and dizzy. Patient does not usually complain of urinary symptoms when she has a UTI. Patient does wear briefs. States has been sick with a cold but feeling better. Cough is better. No fever, wheezing, chest pain, palpitations, flank pain, or shortness of breath. Reports her blood pressure this morning was 137/79, pulse 76 bpm.       GAD 7 : Generalized Anxiety Score 01/15/2021 11/28/2020 10/03/2020 08/23/2016  Nervous, Anxious, on Edge 0 0 0 0  Control/stop worrying 0 0 0 0  Worry  too much - different things 0 0 0 0  Trouble relaxing 1 0 0 0  Restless 0 0 0 0  Easily annoyed or irritable 0 0 0 0  Afraid - awful might happen 0 0 0 0  Total GAD 7 Score 1 0 0 0  Anxiety Difficulty Not difficult at all - - Not difficult at all    Depression screen [facility] 2/9 03/26/2021 01/15/2021 11/28/2020 10/03/2020 06/12/2020  Decreased Interest 0 0 0 0 0  Down, Depressed, Hopeless 0 0 0 0 0  PHQ - 2 Score 0 0 0 0 0  Altered sleeping - 0 0 0 0  Tired, decreased energy - 0 1 0 0  Change in appetite - 1 0 0 0  Feeling bad or failure about yourself  - 0 0 0 0  Trouble concentrating - 0 0 0 0  Moving slowly or fidgety/restless - 0 0 0 0  Suicidal thoughts - 0 0 0 0  PHQ-9 Score - 1 1 0 0  Difficult doing work/chores - Not difficult at all - - -  Some recent data might be hidden      Impression and Recommendations:     1. Altered mental status, unspecified altered mental status type   2. Nausea and vomiting, unspecified vomiting type     -Advised to bring urine sample ASAP to evaluate for UTI and send for urine culture. If UA shows signs of possible UTI then will start empiric antibiotic therapy. Continue with adequate hydration. If nausea or vomiting re-occurs then recommend ondansetron.  Advised if symptoms worsen then recommend going  to the ED for further evaluation, patient's daughter verbalized understanding and agreeable.    - As part of my medical decision making, I reviewed the following data within the [place] History obtained from pt /family, CMA notes reviewed and incorporated if applicable, Labs reviewed, Radiograph/ tests reviewed if applicable and OV notes from prior OV's with me, as well as any other specialists she/he has seen since seeing me last, were all reviewed and used in my medical decision making process today.    - Additionally, when appropriate, discussion had with patient regarding our treatment plan, and their biases/concerns about that  plan were used in my medical decision making today.    - The patient agreed with the plan and demonstrated an understanding of the instructions.   No barriers to understanding were identified.     - The patient was advised to call back or seek an in-person evaluation if the symptoms worsen or if the condition fails to improve as anticipated.   Return if symptoms worsen or fail to improve.    No orders of the defined types were placed in this encounter.   No orders of the defined types were placed in this encounter.   There are no discontinued medications.     Time spent on telephone encounter was 8 minutes.      The [place] was signed into law in 2016 which includes the topic of electronic health records.  This provides immediate access to information in MyChart.  This includes consultation notes, operative notes, office notes, lab results and pathology reports.  If you have any questions about what you read please let us know at your next visit or call us at the office.  We are right here with you.   __________________________________________________________________________________     Patient Care Team    Relationship Specialty Notifications Start End  Donna Arias, [place] PCP - General Physician Assistant  06/04/19      -Vitals obtained; medications/ allergies reconciled;  personal medical, social, Sx etc.histories were updated by CMA, reviewed by me and are reflected in chart   Patient Active Problem List   Diagnosis Date Noted   Dysphagia 04/30/2020   Impacted cerumen of right ear 04/18/2020   Exudative age-related macular degeneration of right eye with active choroidal neovascularization ([place]) 12/03/2019   Exudative age-related macular degeneration of left eye with inactive choroidal neovascularization ([place]) 06/04/2019   Posterior vitreous detachment of right eye 06/04/2019   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   Advanced nonexudative  age-related macular degeneration of left eye with subfoveal involvement 06/04/2019   Intermediate stage nonexudative age-related macular degeneration of right eye 06/04/2019   Excessive cerumen in ear canal, bilateral 11/06/2018   Ear fullness, left 01/16/2018   Multiple atypical nevi 06/23/2017   Ankle edema, bilateral 05/02/2017   Postnasal drip 12/13/2016   Orthostatic hypotension 11/22/2016   Hyperlipidemia 11/22/2016   Anxiety 11/22/2016   Dysarthria 11/22/2016   Weight gain 11/18/2016   Edema of right foot 11/18/2016   Age-related osteoporosis without current pathological fracture 11/16/2016   Cough 11/16/2016   Healthcare maintenance 10/14/2016   Diaper dermatitis 06/10/2016   Antibiotic-induced yeast infection 06/10/2016   Hearing loss secondary to cerumen impaction, bilateral 06/10/2016   Chronic bilateral low back pain 04/19/2016   Chronic neck pain 04/19/2016   Medication refill 04/14/2016   Joint laxity of right knee 04/14/2016   Hypothyroidism 02/04/2016   Upper back pain 08/01/2009   Transient cerebral  ischemia 09/22/2007   Dementia with behavioral disturbance 10/07/2006   Essential hypertension 10/07/2006   COPD exacerbation ([place]) 10/07/2006   GERD 10/07/2006     Current Meds  Medication Sig   albuterol (PROVENTIL) (2.5 MG/3ML) 0.083% nebulizer solution Take 3 mLs (2.5 mg total) by nebulization every 6 (six) hours as needed for wheezing or shortness of breath.   amLODipine (NORVASC) 2.5 MG tablet TAKE 1 TABLET (2.5 MG TOTAL) BY MOUTH DAILY.   aspirin 81 MG tablet Take 81 mg by mouth daily.   atorvastatin (LIPITOR) 10 MG tablet TAKE 1 TABLET BY MOUTH DAILY.   benzonatate (TESSALON) 100 MG capsule Take 1 capsule (100 mg total) by mouth 3 (three) times daily as needed for cough.   budesonide (PULMICORT) 0.25 MG/2ML nebulizer solution Take 2 mLs (0.25 mg total) by nebulization in the morning and at bedtime.   Calcium Carbonate-Vit D-Min (CALCIUM 1200 PO) Take 1  tablet by mouth.   erythromycin ophthalmic ointment Place 1 application into the left eye at bedtime.   fexofenadine (ALLEGRA) 180 MG tablet Take 180 mg by mouth daily.   FLUoxetine (PROZAC) 10 MG capsule TAKE 1 CAPSULE BY MOUTH DAILY.   fluticasone (FLONASE) 50 MCG/ACT nasal spray Place 1 spray into both nostrils 2 (two) times daily.   furosemide (LASIX) 20 MG tablet TAKE 1 TABLET BY MOUTH DAILY.   ketoconazole (NIZORAL) 2 % shampoo Apply topically 2 (two) times a week.   levothyroxine (SYNTHROID) 25 MCG tablet TAKE 1/2 TABLET BY MOUTH DAILY WITH 50 MCG TO EQUAL 62.5 MCG TOTAL DAILY   levothyroxine (SYNTHROID) 50 MCG tablet TAKE 1 TABLET BY MOUTH EVERY DAY   mupirocin ointment (BACTROBAN) 2 % Apply topically 2 (two) times daily.   nystatin (MYCOSTATIN/NYSTOP) powder Apply 1 application topically 2 (two) times daily.   potassium chloride SA (KLOR-CON) 20 MEQ tablet TAKE 1 TABLET BY MOUTH ONCE DAILY.   predniSONE (DELTASONE) 20 MG tablet Take 2 tablets by mouth x 2 days, 1 tablets x 2 days, 0.5 tablet x 2 days   risperiDONE (RISPERDAL) 1 MG tablet TAKE 1 TABLET BY MOUTH 2 TIMES DAILY.   STIOLTO RESPIMAT 2.5-2.5 MCG/ACT AERS INHALE 2 PUFFS INTO THE LUNGS DAILY.   triamcinolone cream (KENALOG) 0.1 % Apply 1 application topically 2 (two) times daily. Request jar   Current Facility-Administered Medications for the 03/26/21 encounter (Office Visit) with Donna Reid, PA-C  Medication   ipratropium-albuterol (DUONEB) 0.5-2.5 (3) MG/3ML nebulizer solution 3 mL     Allergies:  Allergies  Allergen Reactions   Codeine Sulfate     REACTION: unspecified   Picato [Ingenol Mebutate] Hives     ROS:  See above HPI for pertinent positives and negatives   Objective:   Blood pressure 137/79, pulse 76, height 5\' 4"  (1.626 m), weight 149 lb (67.6 kg).  (if some vitals are omitted, this means that patient was UNABLE to obtain them.) General: Sounds in no acute distress Respiratory: speaking in full  sentences, no conversational dyspnea Psych: insight appears good, mood- appears appropriate

## 2021-03-26 NOTE — Patient Instructions (Signed)
Confusion Confusion is the inability to think with your usual speed or clarity. Confusion can be caused by many things. People who are confused often describe their thinking as cloudy or unclear. Confusion can also include feeling disoriented. This means you are unaware of where you are or who you are. You may also not know the date or time. When confused, you may have trouble remembering, paying attention, or making decisions. Some people also act aggressively when they areconfused. In some cases, confusion may come on quickly. In other cases, it may developslowly over time. Confusion may be caused by medical conditions such as: Infections, such as a urinary tract infection (UTI). Low levels of oxygen, which can develop from conditions such as long-term lung disorders. Decrease in brain function due to dementia and other conditions that affect the brain, such as seizures, strokes, brain tumors, or head injuries. Mental health conditions, like panic attacks, anxiety, depression, and hallucinations. Confusion may also be caused by physical factors such as: Loss of fluid (dehydration) or an imbalance of salts and minerals in the body (electrolytes). Lack of certain nutrients like niacin, thiamine, or other B vitamins. Fever or hypothermia, which is a sudden drop in body temperature. Low or high blood sugar. Low or high blood pressure. Other causes include: Lack of sleep or changes in routine or surroundings, such as when traveling or staying in a hospital. Using too much alcohol, drugs, or medicine. Side effects of medicines, or taking medicines that affect other medicines (drug interactions). Follow these instructions at home: Pay attention to your symptoms. Tell your health care provider about any changes or if you develop new symptoms. Follow these instructions to control ortreat symptoms. Ask a family member or friend for help if needed. Medicines  Take over-the-counter and prescription  medicines only as told by your health care provider. Ask your health care provider about changing or stopping any medicines that may be causing your confusion. Avoid pain medicines or sleep medicines until you have fully recovered. Use a pillbox or an alarm to help you take the right medicines at the right time.  Lifestyle  Eat a balanced diet that includes fruits and vegetables. Get enough sleep. For most adults, this is 7-9 hours each night. Do not drink alcohol. Do not become isolated. Spend time with other people and make plans for your days. Do not drive until your health care provider says that it is safe to do so. Do not use any products that contain nicotine or tobacco, such as cigarettes, e-cigarettes, and chewing tobacco. If you need help quitting, ask your health care provider. Stop other activities that may increase your chances of getting hurt. These may include some work duties, sports activities, swimming, or bike riding. Ask your health care provider what activities are safe for you.  Tips for caregivers Find out if the person is confused. Ask the person to state his or her name, age, and the date. If the person is unsure or answers incorrectly, he or she may be confused and need assistance. Always introduce yourself, no matter how well the person knows you. Remind the person of his or her location. Place a calendar and clock near the person who is confused. Keep a regular schedule. Make sure the person has plenty of light during the day and sleep at night. Talk about current events and plans for the day. Keep the environment calm, quiet, and peaceful. Help the person do the things that he or she is unable to do.   These include: Taking medicines. Keeping medical appointments. Helping with household duties, including meal preparation. Running errands. Get help if you need it. There are several support groups for caregivers. If the person you are helping needs more support,  consider day care, extended-care programs, or a skilled nursing facility. The person's health care provider may be able to help evaluate these options. General instructions Monitor yourself for any conditions you may have. These can include: Checking your blood glucose levels if you have diabetes. Maintaining a healthy weight. Monitoring your blood pressure if you have hypertension. Monitoring your body temperature if you have a fever. Keep all follow-up visits. This is important. Contact a health care provider if: You have new symptoms or your symptoms get worse. Get help right away if you: Feel that you are not able to care for yourself. Develop severe headaches, repeated vomiting, seizures, blackouts, or slurred speech. Have increasing confusion, weakness, numbness, restlessness, or personality changes. Develop a loss of balance, have marked dizziness, feel uncoordinated, or fall. Develop severe anxiety, or you have delusions or hallucinations. These symptoms may represent a serious problem that is an emergency. Do not wait to see if the symptoms will go away. Get medical help right away. Call your local emergency services (911 in the [place] Do not drive yourself to the hospital. Summary Confusion is the inability to think with your usual speed or clarity. People who are confused often describe their thinking as cloudy or unclear. Confusion can also include having trouble remembering, paying attention, or making decisions. Confusion may come on quickly or develop slowly over time, depending on the cause. There are many different causes of confusion. Ask for help from family members or friends if you are unable to take care of yourself. This information is not intended to replace advice given to you by your health care provider. Make sure you discuss any questions you have with your healthcare provider. Document Revised: 05/08/2019 Document Reviewed: 05/08/2019 Elsevier Patient Education   2022 [employer] Inc.  

## 2021-03-30 ENCOUNTER — Telehealth: Payer: Self-pay | Admitting: Internal Medicine

## 2021-03-30 ENCOUNTER — Other Ambulatory Visit: Payer: Self-pay | Admitting: Nurse Practitioner

## 2021-03-30 MED ORDER — BUDESONIDE 0.25 MG/2ML IN SUSP: 0.2500 mg | Freq: Two times a day (BID) | RESPIRATORY_TRACT | 0 refills | Status: DC

## 2021-03-30 NOTE — Progress Notes (Signed)
I know he did a phone visit with Maritza on Thursday. I can't tell if she sent anything in for him. If she did not, I will do that now. Can you ask patient for me?  ?Thanks so much.   -HB

## 2021-03-30 NOTE — Telephone Encounter (Signed)
I called the daughter and gave her a refill and made a follow up for the patient to be seen. Nothing further needed.  ?

## 2021-03-31 ENCOUNTER — Other Ambulatory Visit: Payer: Self-pay | Admitting: Nurse Practitioner

## 2021-03-31 DIAGNOSIS — N390 Urinary tract infection, site not specified: Secondary | ICD-10-CM

## 2021-03-31 MED ORDER — SULFAMETHOXAZOLE-TRIMETHOPRIM 400-80 MG PO TABS: 1.0000 | ORAL_TABLET | Freq: Two times a day (BID) | ORAL | 0 refills | Status: DC

## 2021-03-31 NOTE — Progress Notes (Signed)
Please let the patient know that I sent rx for single strength bactrm to [facility]. This is twice daily for next 5 days. Will update prescription if needed for final culture and sensitivity results. Thanks so much.   -HB  ?

## 2021-03-31 NOTE — Progress Notes (Signed)
Urine sample showing evidence of infection. Sent rx for single strength bactrm to [facility]. This is twice daily for next 5 days. Will update prescription if needed for final culture and sensitivity results.  ?

## 2021-04-01 LAB — URINE CULTURE

## 2021-04-13 ENCOUNTER — Other Ambulatory Visit: Payer: Self-pay | Admitting: Physician Assistant

## 2021-04-13 DIAGNOSIS — R21 Rash and other nonspecific skin eruption: Secondary | ICD-10-CM

## 2021-04-20 ENCOUNTER — Other Ambulatory Visit: Payer: Self-pay

## 2021-04-20 ENCOUNTER — Other Ambulatory Visit: Payer: Self-pay | Admitting: Physician Assistant

## 2021-04-20 ENCOUNTER — Encounter: Payer: Self-pay | Admitting: Primary Care

## 2021-04-20 ENCOUNTER — Ambulatory Visit: Payer: Medicare HMO | Admitting: Primary Care

## 2021-04-20 DIAGNOSIS — J301 Allergic rhinitis due to pollen: Secondary | ICD-10-CM

## 2021-04-20 DIAGNOSIS — J309 Allergic rhinitis, unspecified: Secondary | ICD-10-CM | POA: Insufficient documentation

## 2021-04-20 DIAGNOSIS — J449 Chronic obstructive pulmonary disease, unspecified: Secondary | ICD-10-CM | POA: Diagnosis not present

## 2021-04-20 DIAGNOSIS — R1310 Dysphagia, unspecified: Secondary | ICD-10-CM

## 2021-04-20 DIAGNOSIS — E039 Hypothyroidism, unspecified: Secondary | ICD-10-CM

## 2021-04-20 MED ORDER — BUDESONIDE 0.25 MG/2ML IN SUSP: 0.2500 mg | Freq: Two times a day (BID) | RESPIRATORY_TRACT | 11 refills | Status: AC

## 2021-04-20 NOTE — Assessment & Plan Note (Addendum)
-   Reviewed aspiration precautions  ?

## 2021-04-20 NOTE — Patient Instructions (Addendum)
Recommendations: ?Continue Stiolto Respimat - take two puffs once daily ?Continue Budeonisde nebulizer morning and evening as directed  ?Use albuterol every 4-6 hours as needed for breakthrough shortness of breath/wheezing  ?Continue Allegra and flonase as needed  ?Try chlorpheniramine '4mg'$  every 4 hours as needed for cough/PND symptoms ? ?Rx: ?Budesonide neb (refill for x1 year sent) ? ?Follow-up: ?6 months with Dr. Chase Caller or sooner if needed  ?

## 2021-04-20 NOTE — Assessment & Plan Note (Addendum)
-   Stable; Last exacerbation was in December 2022. Currently well controlled on Stiolto and Budesnoide nebulizer twice daily. Rare SABA use. FU in 6 months or sooner if needed.  ?

## 2021-04-20 NOTE — Progress Notes (Signed)
? ?'@Patient'$  ID: Donna Arias, female    DOB: July 20, 1926, 88 y.o.   MRN: [id number] ? ?Chief Complaint  ?Patient presents with  ? Follow-up  ?  Pt states she is just having allergies, her COPD is doing really well no issues noted.   ? ? ?Referring provider: ?Lorrene Reid, PA-C ? ?HPI: ?88 year old female, former smoker quit in 1995.  Past medical history significant for COPD, chronic sinusitis, hypertension, macular degeneration, transient cerebral ischemia, GERD, dysphagia, hypothyroidism, dementia, age-related osteoporosis, hyperlipidemia.  Patient of Dr. Chase Caller.  Maintained on Stiolto Respimat and Pulmicort 0.'25mg'$  neb BID. ? ?Previous [facility] pulmonary encounter: ?09/04/2020 ?Patient presents today for 75-monthfollow-up.  Since her last visit, [facility].  She originally called our office on 08/26/2020 with reports of recurrent cough, wheezing and intermittent shortness of breath.  She was sent in prescription for prednisone taper and Keflex.  No additional treatments were prescribed or given at urgent care. Covid test was negative.  ? ?She is feeling a lot better today. Her daughter states that she still has some cough and wheezing.  Breathing has been better, states that she is not as short of breath as she was. She is currently using Stiolto 2 puffs in the morning and pulmicort nebulizer twice a day. She has PND symptoms, she is working really hard to get up mucus. She will have coughing fits when she eats. Her daughter is watching her when she eats and trying to reinforce small sips and bites.  ? ?04/20/2021 -Interim hx  ?Patient presents today for regular office follow-up. Breathing wise she is doing well. She has no acute complaints except for having some allergy symptoms currently. She had URI in December 2022 which was treated with Zpack and prednisone. She is compliant with Stiolto respimat two puff daily and Budesonide nebulizer  twice daily. She has not needed to use albuterol recently. She is taking Allegrea and flonase. She is at risk for aspiration. She had an choking episode in September 2022 after eating a piece of shrimp, her daughter needed to give her the Heimlich. She practices chin tuck and her daughter watches her while eating at all times.  ? ? ?Allergies  ?Allergen Reactions  ? Picato [Ingenol Mebutate] Hives  ? Codeine Sulfate Other (See Comments)  ?  REACTION: unspecified  ? ? ?Immunization History  ?Administered Date(s) Administered  ? Fluad Quad(high Dose 65+) 11/06/2018, 12/11/2019, 11/07/2020  ? H1N1 02/26/2008  ? Influenza Split 12/02/2010, 10/07/2011  ? Influenza Whole 11/17/2006, 10/27/2007, 12/01/2009  ? Influenza, High Dose Seasonal PF 10/15/2014, 11/10/2015, 10/25/2016, 10/27/2017  ? Influenza,inj,Quad PF,6+ Mos 11/10/2012, 09/18/2013  ? PFIZER(Purple Top)SARS-COV-2 Vaccination 01/30/2019, 04/01/2019, 05/01/2019  ? Pneumococcal Conjugate-13 04/30/2013  ? Pneumococcal Polysaccharide-23 01/26/2003  ? Td 01/25/1997, 02/26/2008  ? Zoster Recombinat (Shingrix) 11/30/2019, 01/31/2020  ? Zoster, Live 04/08/2008  ? ? ?Past Medical History:  ?Diagnosis Date  ? Chronic headaches   ? COPD (chronic obstructive pulmonary disease) ([facility]   ? [place] ([facility]   ? Eye infection, left 01/16/2018  ? GERD (gastroesophageal reflux disease)   ? Heart attack ([facility] 1998  ? HLD (hyperlipidemia)   ? Hypertension   ? Skin cancer   ? squamous cell of left face  ? Stroke ([facility]   ? Thyroid disease   ? ? ?Tobacco History: ?Social History  ? ?Tobacco Use  ?Smoking Status Former  ? Types: Cigarettes  ? Quit date: 1995  ?  Years since quitting: 28.2  ?Smokeless Tobacco Never  ? ?Counseling given: Not Answered ? ? ?Outpatient Medications Prior to Visit  ?Medication Sig Dispense Refill  ? albuterol (PROVENTIL) (2.5 MG/3ML) 0.083% nebulizer solution Take 3 mLs (2.5 mg total) by nebulization every 6 (six) hours as needed for wheezing or shortness of  breath. 75 mL 6  ? amLODipine (NORVASC) 2.5 MG tablet TAKE 1 TABLET (2.5 MG TOTAL) BY MOUTH DAILY. 90 tablet 1  ? aspirin 81 MG tablet Take 81 mg by mouth daily.    ? atorvastatin (LIPITOR) 10 MG tablet TAKE 1 TABLET BY MOUTH DAILY. 90 tablet 1  ? benzonatate (TESSALON) 100 MG capsule Take 1 capsule (100 mg total) by mouth 3 (three) times daily as needed for cough. 30 capsule 1  ? Calcium Carbonate-Vit D-Min (CALCIUM 1200 PO) Take 1 tablet by mouth.    ? erythromycin ophthalmic ointment Place 1 application into the left eye at bedtime. 3.5 g 2  ? fexofenadine (ALLEGRA) 180 MG tablet Take 180 mg by mouth daily.    ? FLUoxetine (PROZAC) 10 MG capsule TAKE 1 CAPSULE BY MOUTH DAILY. 90 capsule 1  ? fluticasone (FLONASE) 50 MCG/ACT nasal spray Place 1 spray into both nostrils 2 (two) times daily. 16 g 6  ? furosemide (LASIX) 20 MG tablet TAKE 1 TABLET BY MOUTH DAILY. 90 tablet 0  ? ketoconazole (NIZORAL) 2 % shampoo Apply topically 2 (two) times a week. 120 mL 1  ? levothyroxine (SYNTHROID) 25 MCG tablet TAKE 1/2 TABLET BY MOUTH DAILY WITH 50 MCG TO EQUAL 62.5 MCG TOTAL DAILY 45 tablet 0  ? levothyroxine (SYNTHROID) 50 MCG tablet TAKE 1 TABLET BY MOUTH EVERY DAY 90 tablet 1  ? mupirocin ointment (BACTROBAN) 2 % APPLY TO AFFECTED AREA ON THE SKIN 2 TIMES A DAY 22 g 0  ? nystatin (MYCOSTATIN/NYSTOP) powder Apply 1 application topically 2 (two) times daily. 60 g 2  ? potassium chloride SA (KLOR-CON) 20 MEQ tablet TAKE 1 TABLET BY MOUTH ONCE DAILY. 90 tablet 1  ? risperiDONE (RISPERDAL) 1 MG tablet TAKE 1 TABLET BY MOUTH 2 TIMES DAILY. 180 tablet 1  ? STIOLTO RESPIMAT 2.5-2.5 MCG/ACT AERS INHALE 2 PUFFS INTO THE LUNGS DAILY. 4 g 2  ? triamcinolone cream (KENALOG) 0.1 % Apply 1 application topically 2 (two) times daily. Request jar 453 g 0  ? budesonide (PULMICORT) 0.25 MG/2ML nebulizer solution Take 2 mLs (0.25 mg total) by nebulization in the morning and at bedtime. 120 mL 0  ? predniSONE (DELTASONE) 20 MG tablet Take 2  tablets by mouth x 2 days, 1 tablets x 2 days, 0.5 tablet x 2 days 7 tablet 0  ? sulfamethoxazole-trimethoprim (BACTRIM) 400-80 MG tablet Take 1 tablet by mouth 2 (two) times daily. 10 tablet 0  ? ?Facility-Administered Medications Prior to Visit  ?Medication Dose Route Frequency Provider Last Rate Last Admin  ? ipratropium-albuterol (DUONEB) 0.5-2.5 (3) MG/3ML nebulizer solution 3 mL  3 mL Nebulization Q6H Danford, Katy D, NP   3 mL at 12/08/17 0958  ? ? ?Review of Systems ? ?Review of Systems  ?Constitutional: Negative.   ?HENT: Negative.    ?Respiratory: Negative.    ?Cardiovascular: Negative.   ? ? ?Physical Exam ? ?BP 118/64 (BP Location: Right Arm, Patient Position: Sitting, Cuff Size: Normal)   Pulse 89   Temp 98.6 ?F (37 ?C) (Oral)   Ht '5\' 4"'$  (1.626 m)   Wt 150 lb (68 kg)   SpO2 97%   BMI  25.75 kg/m?  ?Physical Exam ?Constitutional:   ?   General: She is not in acute distress. ?   Appearance: Normal appearance. She is not ill-appearing.  ?   Comments: Very pleasant   ?HENT:  ?   Head: Normocephalic and atraumatic.  ?   Mouth/Throat:  ?   Mouth: Mucous membranes are moist.  ?   Pharynx: Oropharynx is clear.  ?   Comments: Dentures ?Cardiovascular:  ?   Rate and Rhythm: Normal rate and regular rhythm.  ?Pulmonary:  ?   Effort: Pulmonary effort is normal.  ?   Breath sounds: Normal breath sounds. No wheezing, rhonchi or rales.  ?   Comments: CTA ?Musculoskeletal:     ?   General: Normal range of motion.  ?Skin: ?   General: Skin is warm and dry.  ?Neurological:  ?   General: No focal deficit present.  ?   Mental Status: She is alert and oriented to person, place, and time. Mental status is at baseline.  ?Psychiatric:     ?   Mood and Affect: Mood normal.     ?   Behavior: Behavior normal.     ?   Thought Content: Thought content normal.     ?   Judgment: Judgment normal.  ?  ? ?Lab Results: ? ?CBC ?   ?Component Value Date/Time  ? WBC 6.7 11/28/2020 1036  ? WBC 7.1 03/20/2020 1409  ? RBC 4.49 11/28/2020  1036  ? RBC 4.05 03/20/2020 1409  ? HGB 13.7 11/28/2020 1036  ? HCT 42.7 11/28/2020 1036  ? PLT 167 11/28/2020 1036  ? MCV 95 11/28/2020 1036  ? MCH 30.5 11/28/2020 1036  ? MCHC 32.1 11/28/2020 1036  ? MCHC 33.3 03/20/18

## 2021-04-20 NOTE — Assessment & Plan Note (Addendum)
-   Continue Allegra and Flonase as directed  ?- Advised she can try taking Chlorpheniramine '4mg'$  every 4 hours as needed for PND/allergy symptoms. Cautioned about possible sedation side effects.  ?

## 2021-04-23 ENCOUNTER — Encounter (INDEPENDENT_AMBULATORY_CARE_PROVIDER_SITE_OTHER): Payer: Medicare HMO | Admitting: Ophthalmology

## 2021-04-27 ENCOUNTER — Ambulatory Visit (INDEPENDENT_AMBULATORY_CARE_PROVIDER_SITE_OTHER): Payer: Medicare HMO | Admitting: Ophthalmology

## 2021-04-27 ENCOUNTER — Ambulatory Visit: Payer: Medicare HMO | Admitting: Dermatology

## 2021-04-27 ENCOUNTER — Encounter (INDEPENDENT_AMBULATORY_CARE_PROVIDER_SITE_OTHER): Payer: Self-pay | Admitting: Ophthalmology

## 2021-04-27 DIAGNOSIS — H353124 Nonexudative age-related macular degeneration, left eye, advanced atrophic with subfoveal involvement: Secondary | ICD-10-CM | POA: Diagnosis not present

## 2021-04-27 DIAGNOSIS — H353211 Exudative age-related macular degeneration, right eye, with active choroidal neovascularization: Secondary | ICD-10-CM

## 2021-04-27 MED ORDER — BEVACIZUMAB 2.5 MG/0.1ML IZ SOSY
2.5000 mg | PREFILLED_SYRINGE | INTRAVITREAL | Status: AC | PRN
  Administered 2021-04-27: 2.5 mg via INTRAVITREAL

## 2021-04-27 NOTE — Assessment & Plan Note (Signed)
Atrophy accounts for acuity 

## 2021-04-27 NOTE — Progress Notes (Signed)
? ? ?04/27/2021 ? ?  ? ?CHIEF COMPLAINT ?Patient presents for  ?Chief Complaint  ?Patient presents with  ? Macular Degeneration  ? ? ? ? ?HISTORY OF PRESENT ILLNESS: ?Donna Arias is a 88 y.o. female who presents to the clinic today for:  ? ?HPI   ?6 weeks dilate OD, Avastin OD, OCT. ?Patient states vision is stable and unchanged since last visit. Denies any new floaters or FOL. ? ?Last edited by Laurin Coder on 04/27/2021  2:34 PM.  ?  ? ? ?Referring physician: ?Lorrene Reid, PA-C ?[address] ?[address] ?[place],  [place] [address] ? ?HISTORICAL INFORMATION:  ? ?Selected notes from the [facility] ?  ? ?Lab Results  ?Component Value Date  ? HGBA1C 5.5 11/28/2020  ?  ? ?CURRENT MEDICATIONS: ?Current Outpatient Medications (Ophthalmic Drugs)  ?Medication Sig  ? erythromycin ophthalmic ointment Place 1 application into the left eye at bedtime.  ? ?No current facility-administered medications for this visit. (Ophthalmic Drugs)  ? ?Current Outpatient Medications (Other)  ?Medication Sig  ? albuterol (PROVENTIL) (2.5 MG/3ML) 0.083% nebulizer solution Take 3 mLs (2.5 mg total) by nebulization every 6 (six) hours as needed for wheezing or shortness of breath.  ? amLODipine (NORVASC) 2.5 MG tablet TAKE 1 TABLET (2.5 MG TOTAL) BY MOUTH DAILY.  ? aspirin 81 MG tablet Take 81 mg by mouth daily.  ? atorvastatin (LIPITOR) 10 MG tablet TAKE 1 TABLET BY MOUTH DAILY.  ? benzonatate (TESSALON) 100 MG capsule Take 1 capsule (100 mg total) by mouth 3 (three) times daily as needed for cough.  ? budesonide (PULMICORT) 0.25 MG/2ML nebulizer solution Take 2 mLs (0.25 mg total) by nebulization in the morning and at bedtime.  ? Calcium Carbonate-Vit D-Min (CALCIUM 1200 PO) Take 1 tablet by mouth.  ? fexofenadine (ALLEGRA) 180 MG tablet Take 180 mg by mouth daily.  ? FLUoxetine (PROZAC) 10 MG capsule TAKE 1 CAPSULE BY MOUTH DAILY.  ? fluticasone (FLONASE) 50 MCG/ACT nasal spray Place 1 spray into both nostrils 2 (two) times  daily.  ? furosemide (LASIX) 20 MG tablet TAKE 1 TABLET BY MOUTH DAILY.  ? ketoconazole (NIZORAL) 2 % shampoo Apply topically 2 (two) times a week.  ? levothyroxine (SYNTHROID) 25 MCG tablet TAKE 1/2 TABLET BY MOUTH DAILY WITH 50 MCG TO EQUAL 62.5 MCG TOTAL DAILY  ? levothyroxine (SYNTHROID) 50 MCG tablet TAKE 1 TABLET BY MOUTH EVERY DAY  ? mupirocin ointment (BACTROBAN) 2 % APPLY TO AFFECTED AREA ON THE SKIN 2 TIMES A DAY  ? nystatin (MYCOSTATIN/NYSTOP) powder Apply 1 application topically 2 (two) times daily.  ? potassium chloride SA (KLOR-CON) 20 MEQ tablet TAKE 1 TABLET BY MOUTH ONCE DAILY.  ? risperiDONE (RISPERDAL) 1 MG tablet TAKE 1 TABLET BY MOUTH 2 TIMES DAILY.  ? STIOLTO RESPIMAT 2.5-2.5 MCG/ACT AERS INHALE 2 PUFFS INTO THE LUNGS DAILY.  ? triamcinolone cream (KENALOG) 0.1 % Apply 1 application topically 2 (two) times daily. Request jar  ? ?Current Facility-Administered Medications (Other)  ?Medication Route  ? ipratropium-albuterol (DUONEB) 0.5-2.5 (3) MG/3ML nebulizer solution 3 mL Nebulization  ? ? ? ? ?REVIEW OF SYSTEMS: ?ROS   ?Negative for: Constitutional, Gastrointestinal, Neurological, Skin, Genitourinary, Musculoskeletal, HENT, Endocrine, Cardiovascular, Eyes, Respiratory, Psychiatric, Allergic/Imm, Heme/Lymph ?Last edited by Hurman Horn, MD on 04/27/2021  3:24 PM.  ?  ? ? ? ?ALLERGIES ?Allergies  ?Allergen Reactions  ? Picato [Ingenol Mebutate] Hives  ? Codeine Sulfate Other (See Comments)  ?  REACTION: unspecified  ? ? ?  PAST MEDICAL HISTORY ?Past Medical History:  ?Diagnosis Date  ? Chronic headaches   ? COPD (chronic obstructive pulmonary disease) ([place])   ? Dementia ([place])   ? Eye infection, left 01/16/2018  ? GERD (gastroesophageal reflux disease)   ? Heart attack ([place]) 1998  ? HLD (hyperlipidemia)   ? Hypertension   ? Skin cancer   ? squamous cell of left face  ? Stroke [facility])   ? Thyroid disease   ? ?Past Surgical History:  ?Procedure Laterality Date  ? BREAST LUMPECTOMY Left 1995  ? [place] OF UTERUS  1975  ? GALLBLADDER SURGERY  1993  ? ? ?FAMILY HISTORY ?Family History  ?Problem Relation Age of Onset  ? Hypertension Mother   ? Stroke Mother   ? Stroke Sister   ? Alzheimer's disease Sister   ? Stroke Sister   ? Hypertension Sister   ? Stroke Sister   ? Breast cancer Sister   ? ? ?SOCIAL HISTORY ?Social History  ? ?Tobacco Use  ? Smoking status: Former  ?  Types: Cigarettes  ?  Quit date: 1995  ?  Years since quitting: 28.2  ? Smokeless tobacco: Never  ?Vaping Use  ? Vaping Use: Never used  ?Substance Use Topics  ? Alcohol use: No  ? Drug use: No  ? ?  ? ?  ? ?OPHTHALMIC EXAM: ? ?Base Eye Exam   ? ? Visual Acuity (ETDRS)   ? ?   Right Left  ? Dist cc 20/50 -2 CF at 3'  ? Dist ph cc NI NI  ? ? Correction: Glasses  ? ?  ?  ? ? Tonometry (Tonopen, 2:38 PM)   ? ?   Right Left  ? Pressure 12 15  ? ?  ?  ? ? Pupils   ? ?   Pupils APD  ? Right PERRL None  ? Left PERRL None  ? ?  ?  ? ? Extraocular Movement   ? ?   Right Left  ?  Full Full  ? ?  ?  ? ? Neuro/Psych   ? ? Oriented x3: Yes  ? Mood/Affect: Normal  ? ?  ?  ? ? Dilation   ? ? Right eye: 1.0% Mydriacyl, 2.5% Phenylephrine @ 2:38 PM  ? ?  ?  ? ?  ? ?Slit Lamp and Fundus Exam   ? ? External Exam   ? ?   Right Left  ? External Normal Normal  ? ?  ?  ? ? Slit Lamp Exam   ? ?   Right Left  ? Lids/Lashes Normal Normal  ? Conjunctiva/Sclera White and quiet White and quiet  ? Cornea Clear Clear  ? Anterior Chamber Deep and quiet Deep and quiet  ? Iris Round and reactive Round and reactive  ? Lens Centered posterior chamber intraocular lens Centered posterior chamber intraocular lens  ? Anterior Vitreous Normal Normal  ? ?  ?  ? ? Fundus Exam   ? ?   Right Left  ? Posterior Vitreous Posterior vitreous detachment   ? Disc Peripapillary atrophy   ? C/D Ratio 0.65   ? Macula Retinal pigment epithelial mottling, no macular thickening, Retinal atrophy, Early age related macular degeneration, no serous retinal detachment visible clinically, minor  epiretinal membrane not distorting   ? Vessels Normal   ? Periphery Normal   ? ?  ?  ? ?  ? ? ?IMAGING AND PROCEDURES  ?Imaging and Procedures for  04/27/21 ? ?Intravitreal Injection, Pharmacologic Agent - OD - Right Eye   ? ?   ?Time Out ?04/27/2021. 3:27 PM. Confirmed correct patient, procedure, site, and patient consented.  ? ?Anesthesia ?Topical anesthesia was used. Anesthetic medications included Lidocaine 4%.  ? ?Procedure ?Preparation included Ofloxacin , Tobramycin 0.3%, 10% betadine to eyelids, 5% betadine to ocular surface. A 30 gauge needle was used.  ? ?Injection: ?2.5 mg bevacizumab 2.5 MG/0.1ML ?  Route: Intravitreal, Site: Right Eye ?  [place]: [id number], Lot: [id number]  ? ?Post-op ?Post injection exam found visual acuity of at least counting fingers. The patient tolerated the procedure well. There were no complications. The patient received written and verbal post procedure care education. Post injection medications included ocuflox.  ? ?  ? ?OCT, Retina - OU - Both Eyes   ? ?   ?Right Eye ?Quality was good. Scan locations included subfoveal. Central Foveal Thickness: 273. Progression has improved. Findings include abnormal foveal contour.  ? ?Left Eye ?Quality was good. Scan locations included nasal. Central Foveal Thickness: 98. Progression has been stable. Findings include abnormal foveal contour, inner retinal atrophy, central retinal atrophy, outer retinal atrophy.  ? ?Notes ?Less subretinal fluid and CME perifoveal at 7-week interval.  OD.  We will repeat injection Avastin OD today and examination next in 7 weeks ?OS with macular atrophy from previous retinal vascular disease.  Stable overall ? ?  ? ? ?  ?  ? ?  ?ASSESSMENT/PLAN: ? ?Advanced nonexudative age-related macular degeneration of left eye with subfoveal involvement ?Atrophy accounts for acuity  ? ?  ICD-10-CM   ?1. Exudative age-related macular degeneration of right eye with active choroidal neovascularization (HCC)  H35.3211 Intravitreal  Injection, Pharmacologic Agent - OD - Right Eye  ?  OCT, Retina - OU - Both Eyes  ?  bevacizumab (AVASTIN) SOSY 2.5 mg  ?  ?2. Advanced nonexudative age-related macular degeneration of left eye with subfoveal

## 2021-04-29 ENCOUNTER — Other Ambulatory Visit: Payer: Self-pay | Admitting: Physician Assistant

## 2021-04-29 DIAGNOSIS — R21 Rash and other nonspecific skin eruption: Secondary | ICD-10-CM

## 2021-05-01 ENCOUNTER — Other Ambulatory Visit: Payer: Self-pay | Admitting: Physician Assistant

## 2021-05-01 DIAGNOSIS — J449 Chronic obstructive pulmonary disease, unspecified: Secondary | ICD-10-CM

## 2021-05-13 ENCOUNTER — Other Ambulatory Visit: Payer: Self-pay | Admitting: Physician Assistant

## 2021-05-13 DIAGNOSIS — R21 Rash and other nonspecific skin eruption: Secondary | ICD-10-CM

## 2021-05-15 ENCOUNTER — Other Ambulatory Visit: Payer: Self-pay

## 2021-05-15 DIAGNOSIS — R21 Rash and other nonspecific skin eruption: Secondary | ICD-10-CM

## 2021-05-15 MED ORDER — MUPIROCIN 2 % EX OINT: TOPICAL_OINTMENT | CUTANEOUS | 0 refills | Status: DC

## 2021-05-21 ENCOUNTER — Other Ambulatory Visit: Payer: Self-pay | Admitting: Physician Assistant

## 2021-05-21 DIAGNOSIS — Z760 Encounter for issue of repeat prescription: Secondary | ICD-10-CM

## 2021-06-02 ENCOUNTER — Encounter: Payer: Self-pay | Admitting: Physician Assistant

## 2021-06-02 ENCOUNTER — Ambulatory Visit (INDEPENDENT_AMBULATORY_CARE_PROVIDER_SITE_OTHER): Payer: Medicare HMO | Admitting: Physician Assistant

## 2021-06-02 VITALS — BP 106/68 | HR 73 | Temp 97.7°F | Ht 64.0 in | Wt 138.0 lb

## 2021-06-02 DIAGNOSIS — F03918 Unspecified dementia, unspecified severity, with other behavioral disturbance: Secondary | ICD-10-CM

## 2021-06-02 DIAGNOSIS — N1832 Chronic kidney disease, stage 3b: Secondary | ICD-10-CM | POA: Diagnosis not present

## 2021-06-02 DIAGNOSIS — E039 Hypothyroidism, unspecified: Secondary | ICD-10-CM

## 2021-06-02 DIAGNOSIS — H6121 Impacted cerumen, right ear: Secondary | ICD-10-CM

## 2021-06-02 DIAGNOSIS — I10 Essential (primary) hypertension: Secondary | ICD-10-CM

## 2021-06-02 DIAGNOSIS — E785 Hyperlipidemia, unspecified: Secondary | ICD-10-CM | POA: Diagnosis not present

## 2021-06-02 NOTE — Assessment & Plan Note (Signed)
-  Last TSH wnl at 2.530. ?-Continue current medication regimen. ?-Rechecking thyroid labs today. Pending results will make medication adjustments if indicated. ? ?

## 2021-06-02 NOTE — Assessment & Plan Note (Signed)
-  Controlled. ?-Continue current medication regimen. ?-Will collect CMP to monitor renal function. ?-Will continue to monitor. ?

## 2021-06-02 NOTE — Assessment & Plan Note (Signed)
-  Likely multifactorial including hypertension and age-related. ?-Last Cr 1.17, eGFR 44. ?-Will repeat renal function for monitoring. ?-Recommend to avoid NSAIDs. ?

## 2021-06-02 NOTE — Assessment & Plan Note (Signed)
-  Last lipid panel: HDL 64, LDL 70 ?-Continue current medication regimen. ?-Will repeat lipid panel with annual medicare wellness. ?-Will collect CMP to monitor liver function. ?

## 2021-06-02 NOTE — Progress Notes (Signed)
?Established patient visit ? ? ?Patient: Donna Arias   DOB: 06-Sep-1926   88 y.o. Female  MRN: [id number] ?Visit Date: 06/02/2021 ? ?Chief Complaint  ?Patient presents with  ? Hypertension  ? Hyperlipidemia  ? ?Subjective  ?  ?HPI  ?Patient presents for chronic follow-up. Patient is accompanied by her daughter. Patient has no acute concerns.  ? ?HTN: Pt denies chest pain, palpitations, dizziness, syncope or worsening lower extremity swelling from baseline. Taking medication as directed without side effects. Patient has a low sodium diet.  ? ?HLD: Pt taking medication as directed without issues. Denies side effects including myalgias or muscle weakness. Eating smaller meals with soft foods to avoid choking episodes. ? ?Mood: Mood has been stable. Denies labile mood, severe anxiety episodes or SI/HI. Taking mediations as directed without issues.  ? ?Medications: ?Outpatient Medications Prior to Visit  ?Medication Sig  ? albuterol (PROVENTIL) (2.5 MG/3ML) 0.083% nebulizer solution Take 3 mLs (2.5 mg total) by nebulization every 6 (six) hours as needed for wheezing or shortness of breath.  ? amLODipine (NORVASC) 2.5 MG tablet TAKE 1 TABLET (2.5 MG TOTAL) BY MOUTH DAILY.  ? aspirin 81 MG tablet Take 81 mg by mouth daily.  ? atorvastatin (LIPITOR) 10 MG tablet TAKE 1 TABLET BY MOUTH DAILY.  ? benzonatate (TESSALON) 100 MG capsule Take 1 capsule (100 mg total) by mouth 3 (three) times daily as needed for cough.  ? budesonide (PULMICORT) 0.25 MG/2ML nebulizer solution Take 2 mLs (0.25 mg total) by nebulization in the morning and at bedtime.  ? Calcium Carbonate-Vit D-Min (CALCIUM 1200 PO) Take 1 tablet by mouth.  ? erythromycin ophthalmic ointment Place 1 application into the left eye at bedtime.  ? fexofenadine (ALLEGRA) 180 MG tablet Take 180 mg by mouth daily.  ? FLUoxetine (PROZAC) 10 MG capsule TAKE 1 CAPSULE BY MOUTH DAILY.  ? fluticasone (FLONASE) 50 MCG/ACT nasal spray Place 1 spray into both nostrils 2 (two) times  daily.  ? furosemide (LASIX) 20 MG tablet TAKE 1 TABLET BY MOUTH DAILY.  ? ketoconazole (NIZORAL) 2 % shampoo APPLY TOPICALLY 2 TIMES A WEEK.  ? levothyroxine (SYNTHROID) 25 MCG tablet TAKE 1/2 TABLET BY MOUTH DAILY WITH 50 MCG TO EQUAL 62.5 MCG TOTAL DAILY  ? levothyroxine (SYNTHROID) 50 MCG tablet TAKE 1 TABLET BY MOUTH EVERY DAY  ? mupirocin ointment (BACTROBAN) 2 % APPLY TO AFFECTED AREA ON THE SKIN 2 TIMES A DAY  ? nystatin (MYCOSTATIN/NYSTOP) powder Apply 1 application topically 2 (two) times daily.  ? potassium chloride SA (KLOR-CON) 20 MEQ tablet TAKE 1 TABLET BY MOUTH ONCE DAILY.  ? risperiDONE (RISPERDAL) 1 MG tablet TAKE 1 TABLET BY MOUTH 2 TIMES DAILY.  ? STIOLTO RESPIMAT 2.5-2.5 MCG/ACT AERS INHALE 2 PUFFS INTO THE LUNGS DAILY.  ? triamcinolone cream (KENALOG) 0.1 % Apply 1 application topically 2 (two) times daily. Request jar  ? ?Facility-Administered Medications Prior to Visit  ?Medication Dose Route Frequency Provider  ? ipratropium-albuterol (DUONEB) 0.5-2.5 (3) MG/3ML nebulizer solution 3 mL  3 mL Nebulization Q6H Danford, Katy D, NP  ? ? ?Review of Systems ?Review of Systems:  ?A fourteen system review of systems was performed and found to be positive as per HPI. ? ?Last CBC ?Lab Results  ?Component Value Date  ? WBC 6.7 11/28/2020  ? HGB 13.7 11/28/2020  ? HCT 42.7 11/28/2020  ? MCV 95 11/28/2020  ? MCH 30.5 11/28/2020  ? RDW 14.3 11/28/2020  ? PLT 167 11/28/2020  ? ?Last metabolic panel ?  Lab Results  ?Component Value Date  ? GLUCOSE 93 11/28/2020  ? NA 144 11/28/2020  ? K 4.1 11/28/2020  ? CL 105 11/28/2020  ? CO2 25 11/28/2020  ? BUN 18 11/28/2020  ? CREATININE 1.17 (H) 11/28/2020  ? EGFR 44 (L) 11/28/2020  ? CALCIUM 9.8 11/28/2020  ? PROT 6.3 11/28/2020  ? ALBUMIN 4.2 11/28/2020  ? LABGLOB 2.1 11/28/2020  ? AGRATIO 2.0 11/28/2020  ? BILITOT 0.4 11/28/2020  ? ALKPHOS 96 11/28/2020  ? AST 17 11/28/2020  ? ALT 10 11/28/2020  ? ?Last lipids ?Lab Results  ?Component Value Date  ? CHOL 149  11/28/2020  ? HDL 64 11/28/2020  ? [place] 70 11/28/2020  ? LDLDIRECT 148.4 04/14/2012  ? TRIG 75 11/28/2020  ? CHOLHDL 2.3 11/28/2020  ? ?Last hemoglobin A1c ?Lab Results  ?Component Value Date  ? HGBA1C 5.5 11/28/2020  ? ?Last thyroid functions ?Lab Results  ?Component Value Date  ? TSH 2.530 11/28/2020  ? ?Last vitamin D ?Lab Results  ?Component Value Date  ? VD25OH 37.5 01/20/2017  ? ?  Objective  ?  ?BP 106/68   Pulse 73   Temp 97.7 ?F (36.5 ?C)   Ht '5\' 4"'  (1.626 m)   Wt 138 lb (62.6 kg)   SpO2 97%   BMI 23.69 kg/m?  ?BP Readings from Last 3 Encounters:  ?06/02/21 106/68  ?04/20/21 118/64  ?03/26/21 137/79  ? ?Wt Readings from Last 3 Encounters:  ?06/02/21 138 lb (62.6 kg)  ?04/20/21 150 lb (68 kg)  ?03/26/21 149 lb (67.6 kg)  ? ? ?Physical Exam  ?General:  Cooperative, in no acute distress, appropriate for stated age.  ?Neuro:  Alert and oriented,  extra-ocular muscles intact  ?HEENT:  Normocephalic, atraumatic, PERRL, excessive cerumen in right ear, normal TM of left ear, neck supple, no adenopathy   ?Skin:  no gross rash, warm, pink. ?Cardiac:  RRR, S1 S2 ?Respiratory: CTA B/L  ?Vascular:  Ext warm, no cyanosis apprec.; cap RF less 2 sec. No edema  ?Psych:  No HI/SI, judgement and insight good, Euthymic mood. Full Affect. ? ? ?No results found for any visits on 06/02/21. ? Assessment & Plan  ?  ? ? ?Problem List Items Addressed This Visit   ? ?  ? Cardiovascular and Mediastinum  ? Essential hypertension - Primary  ?  -Controlled. ?-Continue current medication regimen. ?-Will collect CMP to monitor renal function. ?-Will continue to monitor. ? ?  ?  ? Relevant Orders  ? Comp Met (CMET)  ?  ? Endocrine  ? Hypothyroidism  ?  -Last TSH wnl at 2.530. ?-Continue current medication regimen. ?-Rechecking thyroid labs today. Pending results will make medication adjustments if indicated. ? ?  ?  ? Relevant Orders  ? TSH  ? T4, free  ?  ? Nervous and Auditory  ? Dementia with behavioral disturbance ([place])  ?  -Per  chart review, patient was started on Risperdal 1 mg for dementia and Prozac 10 mg was added to her regimen when her husband passed away several years ago. Mood and behavior have been stable so will continue current medication regimen.  ? ?  ?  ?  ? Genitourinary  ? Stage 3b chronic kidney disease ([place])  ?  -Likely multifactorial including hypertension and age-related. ?-Last Cr 1.17, eGFR 44. ?-Will repeat renal function for monitoring. ?-Recommend to avoid NSAIDs. ? ?  ?  ? Relevant Orders  ? Comp Met (CMET)  ?  ? Other  ?  Hyperlipidemia  ?  -Last lipid panel: HDL 64, LDL 70 ?-Continue current medication regimen. ?-Will repeat lipid panel with annual medicare wellness. ?-Will collect CMP to monitor liver function. ? ?  ?  ? ?Other Visit Diagnoses   ? ? Excessive cerumen in right ear canal      ? ?  ? ? ? ?Indication: Cerumen impaction of the right ear(s) ?Medical necessity statement:  On physical examination, cerumen impairs clinically significant portions of the external auditory canal, and tympanic membrane.   ?Consent:  Discussed benefits and risks of procedure and verbal consent obtained ?Procedure:   Patient was prepped for the procedure.  Utilized an otoscope to assess and take note of the ear canal, the tympanic membrane, and the presence, amount, and placement of the cerumen. Gentle water irrigation and soft plastic curette was utilized to remove cerumen. ?Post procedure examination:  shows cerumen was removed, without trauma or injury to the ear canal or TM, which remains intact.   ?Post-Procedural Ear Care Instructions:    Patient tolerated procedure well.  Proper ear care d/c pt.   The patient is made aware that they may experience temporary vertigo, temporary hearing loss, and temporary discomfort.  If these symptom last for more than 24 hours to call the clinic or proceed to the ED/Urgent Care. ? ? ? ?Return in about 6 months (around 12/03/2021) for [facility].  ?   ? ? ? ?Lorrene Reid, PA-C  ?[place]  [facility] ?[phone] (phone) ?[phone] (fax) ? ?[facility] ?

## 2021-06-02 NOTE — Assessment & Plan Note (Signed)
-  Per chart review, patient was started on Risperdal 1 mg for dementia and Prozac 10 mg was added to her regimen when her husband passed away several years ago. Mood and behavior have been stable so will continue current medication regimen.  ?

## 2021-06-02 NOTE — Patient Instructions (Signed)

## 2021-06-03 LAB — TSH: TSH: 1.94 u[IU]/mL (ref 0.450–4.500)

## 2021-06-03 LAB — COMPREHENSIVE METABOLIC PANEL
ALT: 14 IU/L (ref 0–32)
AST: 22 IU/L (ref 0–40)
Albumin/Globulin Ratio: 1.6 (ref 1.2–2.2)
Albumin: 3.9 g/dL (ref 3.5–4.6)
Alkaline Phosphatase: 93 IU/L (ref 44–121)
BUN/Creatinine Ratio: 18 (ref 12–28)
BUN: 21 mg/dL (ref 10–36)
Bilirubin Total: 0.4 mg/dL (ref 0.0–1.2)
CO2: 24 mmol/L (ref 20–29)
Calcium: 9.7 mg/dL (ref 8.7–10.3)
Chloride: 102 mmol/L (ref 96–106)
Creatinine, Ser: 1.17 mg/dL — ABNORMAL HIGH (ref 0.57–1.00)
Globulin, Total: 2.4 g/dL (ref 1.5–4.5)
Glucose: 113 mg/dL — ABNORMAL HIGH (ref 70–99)
Potassium: 3.7 mmol/L (ref 3.5–5.2)
Sodium: 143 mmol/L (ref 134–144)
Total Protein: 6.3 g/dL (ref 6.0–8.5)
eGFR: 44 mL/min/{1.73_m2} — ABNORMAL LOW (ref >59)

## 2021-06-03 LAB — T4, FREE: Free T4: 1.43 ng/dL (ref 0.82–1.77)

## 2021-06-08 ENCOUNTER — Other Ambulatory Visit: Payer: Self-pay | Admitting: Physician Assistant

## 2021-06-08 DIAGNOSIS — Z760 Encounter for issue of repeat prescription: Secondary | ICD-10-CM

## 2021-06-10 ENCOUNTER — Other Ambulatory Visit: Payer: Self-pay | Admitting: Physician Assistant

## 2021-06-15 ENCOUNTER — Ambulatory Visit (INDEPENDENT_AMBULATORY_CARE_PROVIDER_SITE_OTHER): Payer: Medicare HMO | Admitting: Ophthalmology

## 2021-06-15 ENCOUNTER — Other Ambulatory Visit: Payer: Self-pay | Admitting: Physician Assistant

## 2021-06-15 ENCOUNTER — Encounter (INDEPENDENT_AMBULATORY_CARE_PROVIDER_SITE_OTHER): Payer: Self-pay | Admitting: Ophthalmology

## 2021-06-15 DIAGNOSIS — H353211 Exudative age-related macular degeneration, right eye, with active choroidal neovascularization: Secondary | ICD-10-CM

## 2021-06-15 DIAGNOSIS — H353124 Nonexudative age-related macular degeneration, left eye, advanced atrophic with subfoveal involvement: Secondary | ICD-10-CM | POA: Diagnosis not present

## 2021-06-15 DIAGNOSIS — H353222 Exudative age-related macular degeneration, left eye, with inactive choroidal neovascularization: Secondary | ICD-10-CM | POA: Diagnosis not present

## 2021-06-15 MED ORDER — BEVACIZUMAB 2.5 MG/0.1ML IZ SOSY
2.5000 mg | PREFILLED_SYRINGE | INTRAVITREAL | Status: AC | PRN
  Administered 2021-06-15: 2.5 mg via INTRAVITREAL

## 2021-06-15 NOTE — Assessment & Plan Note (Signed)
Massive atrophy counts for acuity

## 2021-06-15 NOTE — Progress Notes (Signed)
06/15/2021     CHIEF COMPLAINT Patient presents for  Chief Complaint  Patient presents with   Macular Degeneration      HISTORY OF PRESENT ILLNESS: Donna Arias is a 88 y.o. female who presents to the clinic today for:   HPI   7 weeks dilate OD Avastin OCT. Patient states vision is stable and unchanged since last visit. Denies any new floaters or FOL.  Last edited by Laurin Coder on 06/15/2021  2:37 PM.      Referring physician: Lorrene Reid, PA-C [address] [place],  [place] [id number]  HISTORICAL INFORMATION:   Selected notes from the MEDICAL RECORD NUMBER    Lab Results  Component Value Date   HGBA1C 5.5 11/28/2020     CURRENT MEDICATIONS: Current Outpatient Medications (Ophthalmic Drugs)  Medication Sig   erythromycin ophthalmic ointment Place 1 application into the left eye at bedtime.   No current facility-administered medications for this visit. (Ophthalmic Drugs)   Current Outpatient Medications (Other)  Medication Sig   albuterol (PROVENTIL) (2.5 MG/3ML) 0.083% nebulizer solution Take 3 mLs (2.5 mg total) by nebulization every 6 (six) hours as needed for wheezing or shortness of breath.   amLODipine (NORVASC) 2.5 MG tablet TAKE 1 TABLET (2.5 MG TOTAL) BY MOUTH DAILY.   aspirin 81 MG tablet Take 81 mg by mouth daily.   atorvastatin (LIPITOR) 10 MG tablet TAKE 1 TABLET BY MOUTH DAILY.   benzonatate (TESSALON) 100 MG capsule Take 1 capsule (100 mg total) by mouth 3 (three) times daily as needed for cough.   budesonide (PULMICORT) 0.25 MG/2ML nebulizer solution Take 2 mLs (0.25 mg total) by nebulization in the morning and at bedtime.   Calcium Carbonate-Vit D-Min (CALCIUM 1200 PO) Take 1 tablet by mouth.   fexofenadine (ALLEGRA) 180 MG tablet Take 180 mg by mouth daily.   FLUoxetine (PROZAC) 10 MG capsule TAKE 1 CAPSULE BY MOUTH DAILY.   fluticasone (FLONASE) 50 MCG/ACT nasal spray Place 1 spray into both nostrils 2 (two) times daily.    furosemide (LASIX) 20 MG tablet TAKE 1 TABLET BY MOUTH DAILY.   ketoconazole (NIZORAL) 2 % shampoo APPLY TOPICALLY 2 TIMES A WEEK.   levothyroxine (SYNTHROID) 25 MCG tablet TAKE 1/2 TABLET BY MOUTH DAILY WITH 50 MCG TO EQUAL 62.5 MCG TOTAL DAILY   levothyroxine (SYNTHROID) 50 MCG tablet TAKE 1 TABLET BY MOUTH EVERY DAY   mupirocin ointment (BACTROBAN) 2 % APPLY TO AFFECTED AREA ON THE SKIN 2 TIMES A DAY   nystatin (MYCOSTATIN/NYSTOP) powder Apply 1 application topically 2 (two) times daily.   potassium chloride SA (KLOR-CON M) 20 MEQ tablet TAKE 1 TABLET BY MOUTH ONCE DAILY.   risperiDONE (RISPERDAL) 1 MG tablet TAKE 1 TABLET BY MOUTH 2 TIMES DAILY.   STIOLTO RESPIMAT 2.5-2.5 MCG/ACT AERS INHALE 2 PUFFS INTO THE LUNGS DAILY.   triamcinolone cream (KENALOG) 0.1 % Apply 1 application topically 2 (two) times daily. Request jar   Current Facility-Administered Medications (Other)  Medication Route   ipratropium-albuterol (DUONEB) 0.5-2.5 (3) MG/3ML nebulizer solution 3 mL Nebulization      REVIEW OF SYSTEMS: ROS   Negative for: Constitutional, Gastrointestinal, Neurological, Skin, Genitourinary, Musculoskeletal, HENT, Endocrine, Cardiovascular, Eyes, Respiratory, Psychiatric, Allergic/Imm, Heme/Lymph Last edited by Hurman Horn, MD on 06/15/2021  3:31 PM.       ALLERGIES Allergies  Allergen Reactions   Picato [Ingenol Mebutate] Hives   Codeine Sulfate Other (See Comments)    REACTION: unspecified  PAST MEDICAL HISTORY Past Medical History:  Diagnosis Date   Chronic headaches    COPD (chronic obstructive pulmonary disease) (HCC)    Dementia (HCC)    Eye infection, left 01/16/2018   GERD (gastroesophageal reflux disease)    Heart attack ([place]) 1998   HLD (hyperlipidemia)    Hypertension    Skin cancer    squamous cell of left face   Stroke ([place])    Thyroid disease    Past Surgical History:  Procedure Laterality Date   BREAST LUMPECTOMY Left 1995   DILATION AND  CURETTAGE OF UTERUS  1975   GALLBLADDER SURGERY  1993    FAMILY HISTORY Family History  Problem Relation Age of Onset   Hypertension Mother    Stroke Mother    Stroke Sister    Alzheimer's disease Sister    Stroke Sister    Hypertension Sister    Stroke Sister    Breast cancer Sister     SOCIAL HISTORY Social History   Tobacco Use   Smoking status: Former    Types: Cigarettes    Quit date: 1995    Years since quitting: 28.4   Smokeless tobacco: Never  Vaping Use   Vaping Use: Never used  Substance Use Topics   Alcohol use: No   Drug use: No         OPHTHALMIC EXAM:  Base Eye Exam     Visual Acuity (ETDRS)       Right Left   Dist cc 20/50 -2 CF at 3'   Dist ph cc NI NI    Correction: Glasses         Tonometry (Tonopen, 2:45 PM)       Right Left   Pressure 9 13         Pupils       Pupils APD   Right PERRL None   Left PERRL None         Extraocular Movement       Right Left    Full Full         Neuro/Psych     Oriented x3: Yes   Mood/Affect: Normal         Dilation     Right eye: 1.0% Mydriacyl, 2.5% Phenylephrine @ 2:45 PM           Slit Lamp and Fundus Exam     External Exam       Right Left   External Normal Normal         Slit Lamp Exam       Right Left   Lids/Lashes Normal Normal   Conjunctiva/Sclera White and quiet White and quiet   Cornea Clear Clear   Anterior Chamber Deep and quiet Deep and quiet   Iris Round and reactive Round and reactive   Lens Centered posterior chamber intraocular lens Centered posterior chamber intraocular lens   Anterior Vitreous Normal Normal         Fundus Exam       Right Left   Posterior Vitreous Posterior vitreous detachment    Disc Peripapillary atrophy    C/D Ratio 0.65    Macula Retinal pigment epithelial mottling, no macular thickening, Retinal atrophy, Early age related macular degeneration, no serous retinal detachment visible clinically, minor  epiretinal membrane not distorting    Vessels Normal    Periphery Normal             IMAGING AND PROCEDURES  Imaging and Procedures for  06/15/21  OCT, Retina - OU - Both Eyes       Right Eye Quality was good. Scan locations included subfoveal. Central Foveal Thickness: 281. Progression has improved. Findings include abnormal foveal contour.   Left Eye Quality was good. Scan locations included nasal. Central Foveal Thickness: 184. Progression has been stable. Findings include abnormal foveal contour, inner retinal atrophy, central retinal atrophy, outer retinal atrophy.   Notes Less subretinal fluid and CME perifoveal at 7-week interval.  OD  As compared to onset onset of disease July 2022..  We will repeat injection Avastin OD today and examination next in 7 weeks OS with macular atrophy from previous retinal vascular disease.  Stable overall     Intravitreal Injection, Pharmacologic Agent - OD - Right Eye       Time Out 06/15/2021. 3:34 PM. Confirmed correct patient, procedure, site, and patient consented.   Anesthesia Topical anesthesia was used. Anesthetic medications included Lidocaine 4%.   Procedure Preparation included Ofloxacin , Tobramycin 0.3%, 10% betadine to eyelids, 5% betadine to ocular surface. A 30 gauge needle was used.   Injection: 2.5 mg bevacizumab 2.5 MG/0.1ML   Route: Intravitreal, Site: Right Eye   NDC: [phone], Lot: [id number], Expiration date: 07/30/2021   Post-op Post injection exam found visual acuity of at least counting fingers. The patient tolerated the procedure well. There were no complications. The patient received written and verbal post procedure care education. Post injection medications included ocuflox.              ASSESSMENT/PLAN:  Exudative age-related macular degeneration of right eye with active choroidal neovascularization (HCC) Vastly improved OD.  Compared to July and previously December.  Repeat injection  Avastin today and this monocular patient at 7-week interval maintain 7-week evaluation interval  Exudative age-related macular degeneration of left eye with inactive choroidal neovascularization (HCC) No recurrence of CNVM OS  Advanced nonexudative age-related macular degeneration of left eye with subfoveal involvement Massive atrophy counts for acuity     ICD-10-CM   1. Exudative age-related macular degeneration of right eye with active choroidal neovascularization (HCC)  H35.3211 OCT, Retina - OU - Both Eyes    Intravitreal Injection, Pharmacologic Agent - OD - Right Eye    bevacizumab (AVASTIN) SOSY 2.5 mg    2. Exudative age-related macular degeneration of left eye with inactive choroidal neovascularization ([place])  H35.3222     3. Advanced nonexudative age-related macular degeneration of left eye with subfoveal involvement  H35.3124       1.  OD doing very well, with preserved acuity and much less intraretinal fluid and subretinal fluid in the foveal region now on active therapy.  Currently at 7-week follow-up interval post Avastin.  Repeat injection today  2.  OS diffuse macular atrophy limits acuity  3.  Ophthalmic Meds Ordered this visit:  Meds ordered this encounter  Medications   bevacizumab (AVASTIN) SOSY 2.5 mg       Return in about 7 weeks (around 08/03/2021) for dilate, OD, AVASTIN OCT.  There are no Patient Instructions on file for this visit.   Explained the diagnoses, plan, and follow up with the patient and they expressed understanding.  Patient expressed understanding of the importance of proper follow up care.   Clent Demark Haylei Cobin M.D. Diseases & Surgery of the Retina and Vitreous [facility] [place] 06/15/21     Abbreviations: M myopia (nearsighted); A astigmatism; H hyperopia (farsighted); P presbyopia; Mrx spectacle prescription;  CTL contact lenses; OD right eye;  OS left eye; OU both eyes  XT exotropia; ET esotropia; PEK punctate epithelial  keratitis; PEE punctate epithelial erosions; DES dry eye syndrome; MGD meibomian gland dysfunction; ATs artificial tears; PFAT's preservative free artificial tears; [place] nuclear sclerotic cataract; PSC posterior subcapsular cataract; ERM epi-retinal membrane; PVD posterior vitreous detachment; RD retinal detachment; DM diabetes mellitus; DR diabetic retinopathy; NPDR non-proliferative diabetic retinopathy; PDR proliferative diabetic retinopathy; CSME clinically significant macular edema; DME diabetic macular edema; dbh dot blot hemorrhages; CWS cotton wool spot; POAG primary open angle glaucoma; C/D cup-to-disc ratio; HVF humphrey visual field; GVF goldmann visual field; OCT optical coherence tomography; IOP intraocular pressure; BRVO Branch retinal vein occlusion; CRVO central retinal vein occlusion; CRAO central retinal artery occlusion; BRAO branch retinal artery occlusion; RT retinal tear; SB scleral buckle; PPV pars plana vitrectomy; VH Vitreous hemorrhage; PRP panretinal laser photocoagulation; IVK intravitreal kenalog; VMT vitreomacular traction; MH Macular hole;  NVD neovascularization of the disc; NVE neovascularization elsewhere; AREDS age related eye disease study; ARMD age related macular degeneration; POAG primary open angle glaucoma; EBMD epithelial/anterior basement membrane dystrophy; ACIOL anterior chamber intraocular lens; IOL intraocular lens; PCIOL posterior chamber intraocular lens; Phaco/IOL phacoemulsification with intraocular lens placement; [place] photorefractive keratectomy; LASIK laser assisted in situ keratomileusis; HTN hypertension; DM diabetes mellitus; COPD chronic obstructive pulmonary disease

## 2021-06-15 NOTE — Assessment & Plan Note (Signed)
Vastly improved OD.  Compared to July and previously December.  Repeat injection Avastin today and this monocular patient at 7-week interval maintain 7-week evaluation interval

## 2021-06-15 NOTE — Assessment & Plan Note (Signed)
No recurrence of CNVM OS

## 2021-06-24 ENCOUNTER — Other Ambulatory Visit: Payer: Self-pay | Admitting: Physician Assistant

## 2021-07-03 DIAGNOSIS — H5203 Hypermetropia, bilateral: Secondary | ICD-10-CM | POA: Diagnosis not present

## 2021-07-03 DIAGNOSIS — H524 Presbyopia: Secondary | ICD-10-CM | POA: Diagnosis not present

## 2021-07-06 ENCOUNTER — Ambulatory Visit: Payer: Medicare HMO | Admitting: Dermatology

## 2021-07-06 ENCOUNTER — Encounter: Payer: Self-pay | Admitting: Dermatology

## 2021-07-06 DIAGNOSIS — L570 Actinic keratosis: Secondary | ICD-10-CM | POA: Diagnosis not present

## 2021-07-06 DIAGNOSIS — L578 Other skin changes due to chronic exposure to nonionizing radiation: Secondary | ICD-10-CM

## 2021-07-06 DIAGNOSIS — D692 Other nonthrombocytopenic purpura: Secondary | ICD-10-CM

## 2021-07-06 DIAGNOSIS — L820 Inflamed seborrheic keratosis: Secondary | ICD-10-CM | POA: Diagnosis not present

## 2021-07-06 NOTE — Patient Instructions (Addendum)
Recommend daily broad spectrum sunscreen SPF 30+ to sun-exposed areas, reapply every 2 hours as needed. Call for new or changing lesions.  Staying in the shade or wearing long sleeves, sun glasses (UVA+UVB protection) and wide brim hats (4-inch brim around the entire circumference of the hat) are also recommended for sun protection.    Cryotherapy Aftercare  Wash gently with soap and water everyday.   Apply Vaseline daily until healed.   Prior to procedure, discussed risks of blister formation, small wound, skin dyspigmentation, or rare scar following cryotherapy. Recommend Vaseline ointment to treated areas while healing.   Due to recent changes in healthcare laws, you may see results of your pathology and/or laboratory studies on MyChart before the doctors have had a chance to review them. We understand that in some cases there may be results that are confusing or concerning to you. Please understand that not all results are received at the same time and often the doctors may need to interpret multiple results in order to provide you with the best plan of care or course of treatment. Therefore, we ask that you please give [name] 2 business days to thoroughly review all your results before contacting the office for clarification. Should we see a critical lab result, you will be contacted sooner.   If You Need Anything After Your Visit  If you have any questions or concerns for your doctor, please call our main line at [phone] and press option 4 to reach your doctor's medical assistant. If no one answers, please leave a voicemail as directed and we will return your call as soon as possible. Messages left after 4 pm will be answered the following business day.   You may also send [name] a message via [place]. We typically respond to MyChart messages within 1-2 business days.  For prescription refills, please ask your pharmacy to contact our office. Our fax number is [phone].  If you have an  urgent issue when the clinic is closed that cannot wait until the next business day, you can page your doctor at the number below.    Please note that while we do our best to be available for urgent issues outside of office hours, we are not available 24/7.   If you have an urgent issue and are unable to reach [name], you may choose to seek medical care at your doctor's office, retail clinic, urgent care center, or emergency room.  If you have a medical emergency, please immediately call 911 or go to the emergency department.  Pager Numbers  - Dr. Nehemiah Massed: [phone]  - Dr. Laurence Ferrari: [phone]  - Dr. Nicole Kindred: [phone]  In the event of inclement weather, please call our main line at [phone] for an update on the status of any delays or closures.  Dermatology Medication Tips: Please keep the boxes that topical medications come in in order to help keep track of the instructions about where and how to use these. Pharmacies typically print the medication instructions only on the boxes and not directly on the medication tubes.   If your medication is too expensive, please contact our office at [phone] option 4 or send [name] a message through [place].   We are unable to tell what your co-pay for medications will be in advance as this is different depending on your insurance coverage. However, we may be able to find a substitute medication at lower cost or fill out paperwork to get insurance to cover a needed medication.   If a  prior authorization is required to get your medication covered by your insurance company, please allow [name] 1-2 business days to complete this process.  Drug prices often vary depending on where the prescription is filled and some pharmacies may offer cheaper prices.  The website www.goodrx.com contains coupons for medications through different pharmacies. The prices here do not account for what the cost may be with help from insurance (it may be cheaper with your  insurance), but the website can give you the price if you did not use any insurance.  - You can print the associated coupon and take it with your prescription to the pharmacy.  - You may also stop by our office during regular business hours and pick up a GoodRx coupon card.  - If you need your prescription sent electronically to a different pharmacy, notify our office through [facility] or by phone at [phone] option 4.     Si Usted Necesita Algo Despus de Su Visita  Tambin puede enviarnos un mensaje a travs de Pharmacist, community. Por lo general respondemos a los mensajes de MyChart en el transcurso de 1 a 2 das hbiles.  Para renovar recetas, por favor pida a su farmacia que se ponga en contacto con nuestra oficina. Harland Dingwall de fax es [place] [phone].  Si tiene un asunto urgente cuando la clnica est cerrada y que no puede esperar hasta el siguiente da hbil, puede llamar/localizar a su doctor(a) al nmero que aparece a continuacin.   Por favor, tenga en cuenta que aunque hacemos todo lo posible para estar disponibles para asuntos urgentes fuera del horario de [place], no estamos disponibles las 24 horas del da, los 7 das de la [place].   Si tiene un problema urgente y no puede comunicarse con nosotros, puede optar por buscar atencin mdica  en el consultorio de su doctor(a), en una clnica privada, en un centro de atencin urgente o en una sala de emergencias.  Si tiene Engineering geologist, por favor llame inmediatamente al 911 o vaya a la sala de emergencias.  Nmeros de bper  - Dr. Nehemiah Massed: [phone]  - Dra. Moye: [phone]  - Dra. Nicole Kindred: [phone]  En caso de inclemencias del [place], por favor llame a Johnsie Kindred principal al [phone] para una actualizacin sobre el [place] de cualquier retraso o cierre.  Consejos para la medicacin en dermatologa: Por favor, guarde las cajas en las que vienen los medicamentos de uso tpico para ayudarle a  seguir las instrucciones sobre dnde y cmo usarlos. Las farmacias generalmente imprimen las instrucciones del medicamento slo en las cajas y no directamente en los tubos del [place].   Si su medicamento es muy caro, por favor, pngase en contacto con Zigmund Daniel llamando al [phone] y presione la opcin 4 o envenos un mensaje a travs de Pharmacist, community.   No podemos decirle cul ser su copago por los medicamentos por adelantado ya que esto es diferente dependiendo de la cobertura de su seguro. Sin embargo, es posible que podamos encontrar un medicamento sustituto a Electrical engineer un formulario para que el seguro cubra el medicamento que se considera necesario.   Si se requiere una autorizacin previa para que su compaa de seguros [place] su medicamento, por favor permtanos de 1 a 2 das hbiles para completar este proceso.  Los precios de los medicamentos varan con frecuencia dependiendo del Environmental consultant de dnde se surte la receta y alguna farmacias pueden ofrecer precios ms baratos.  El sitio web www.goodrx.com tiene cupones  para medicamentos de diferentes farmacias. Los precios aqu no tienen en cuenta lo que podra costar con la ayuda del seguro (puede ser ms barato con su seguro), pero el sitio web puede darle el precio si no utiliz ningn seguro.  - Puede imprimir el cupn correspondiente y llevarlo con su receta a la farmacia.  - Tambin puede pasar por nuestra oficina durante el horario de atencin regular y recoger una tarjeta de cupones de GoodRx.  - Si necesita que su receta se enve electrnicamente a una farmacia diferente, informe a nuestra oficina a travs de MyChart de [place] o por telfono llamando al [phone] y presione la opcin 4.  

## 2021-07-06 NOTE — Progress Notes (Signed)
Follow-Up Visit   Subjective  Donna Arias is a 88 y.o. female who presents for the following: Actinic Keratosis (2 year recheck. Face. 16 lesions were Tx with LN2 at last visit. Recheck ISKs Tx with LN2 last visit). The patient has spots, moles and lesions to be evaluated, some may be new or changing and the patient has concerns that these could be cancer.  Patient accompanied by daughter, [name], who contributes to history.   The following portions of the chart were reviewed this encounter and updated as appropriate:  Tobacco  Allergies  Meds  Problems  Med Hx  Surg Hx  Fam Hx     Review of systems: No other skin or systemic complaints except as noted in HPI or Assessment and Plan.  Objective  Well appearing patient in no apparent distress; mood and affect are within normal limits.  A focused examination was performed including face, arms, legs. Relevant physical exam findings are noted in the Assessment and Plan.  Left Elbow - Posterior x1, right forearm x1, right upper arm x1 (2) Erythematous keratotic or waxy stuck-on papule or plaque.  face x22 (22) Erythematous thin papules/macules with gritty scale.    Assessment & Plan  Inflamed seborrheic keratosis (2) Left Elbow - Posterior x1, right forearm x1, right upper arm x1  Symptomatic, irritating, patient would like treated.  Destruction of lesion - Left Elbow - Posterior x1, right forearm x1, right upper arm x1 Complexity: simple   Destruction method: cryotherapy   Informed consent: discussed and consent obtained   Timeout:  patient name, date of birth, surgical site, and procedure verified Lesion destroyed using liquid nitrogen: Yes   Region frozen until ice ball extended beyond lesion: Yes   Outcome: patient tolerated procedure well with no complications   Post-procedure details: wound care instructions given   Additional details:  Prior to procedure, discussed risks of blister formation, small wound, skin  dyspigmentation, or rare scar following cryotherapy. Recommend Vaseline ointment to treated areas while healing.   AK (actinic keratosis) (22) face x22  Actinic keratoses are precancerous spots that appear secondary to cumulative UV radiation exposure/sun exposure over time. They are chronic with expected duration over 1 year. A portion of actinic keratoses will progress to squamous cell carcinoma of the skin. It is not possible to reliably predict which spots will progress to skin cancer and so treatment is recommended to prevent development of skin cancer.  Recommend daily broad spectrum sunscreen SPF 30+ to sun-exposed areas, reapply every 2 hours as needed.  Recommend staying in the shade or wearing long sleeves, sun glasses (UVA+UVB protection) and wide brim hats (4-inch brim around the entire circumference of the hat). Call for new or changing lesions.  Destruction of lesion - face x22 Complexity: simple   Destruction method: cryotherapy   Informed consent: discussed and consent obtained   Timeout:  patient name, date of birth, surgical site, and procedure verified Lesion destroyed using liquid nitrogen: Yes   Region frozen until ice ball extended beyond lesion: Yes   Outcome: patient tolerated procedure well with no complications   Post-procedure details: wound care instructions given   Additional details:  Prior to procedure, discussed risks of blister formation, small wound, skin dyspigmentation, or rare scar following cryotherapy. Recommend Vaseline ointment to treated areas while healing.   Purpura - Chronic; persistent and recurrent.  Treatable, but not curable. - Violaceous macules and patches at legs - Benign - Related to trauma, age, sun damage and/or use  of blood thinners, chronic use of topical and/or oral steroids - Observe - Can use OTC arnica containing moisturizer such as Dermend Bruise Formula if desired - Call for worsening or other concerns  Actinic Damage -  chronic, secondary to cumulative UV radiation exposure/sun exposure over time - diffuse scaly erythematous macules with underlying dyspigmentation - Recommend daily broad spectrum sunscreen SPF 30+ to sun-exposed areas, reapply every 2 hours as needed.  - Recommend staying in the shade or wearing long sleeves, sun glasses (UVA+UVB protection) and wide brim hats (4-inch brim around the entire circumference of the hat). - Call for new or changing lesions.  Return in about 1 year (around 07/07/2022) for AK Follow Up.  I, Emelia Salisbury, CMA, am acting as scribe for Sarina Ser, MD. Documentation: I have reviewed the above documentation for accuracy and completeness, and I agree with the above.  Sarina Ser, MD

## 2021-07-07 ENCOUNTER — Encounter: Payer: Self-pay | Admitting: Dermatology

## 2021-07-27 ENCOUNTER — Encounter (INDEPENDENT_AMBULATORY_CARE_PROVIDER_SITE_OTHER): Payer: Self-pay | Admitting: Ophthalmology

## 2021-07-27 ENCOUNTER — Ambulatory Visit (INDEPENDENT_AMBULATORY_CARE_PROVIDER_SITE_OTHER): Payer: Medicare HMO | Admitting: Ophthalmology

## 2021-07-27 DIAGNOSIS — H353222 Exudative age-related macular degeneration, left eye, with inactive choroidal neovascularization: Secondary | ICD-10-CM

## 2021-07-27 DIAGNOSIS — H353211 Exudative age-related macular degeneration, right eye, with active choroidal neovascularization: Secondary | ICD-10-CM

## 2021-07-27 DIAGNOSIS — H353112 Nonexudative age-related macular degeneration, right eye, intermediate dry stage: Secondary | ICD-10-CM | POA: Diagnosis not present

## 2021-07-27 DIAGNOSIS — H35012 Changes in retinal vascular appearance, left eye: Secondary | ICD-10-CM

## 2021-07-27 MED ORDER — BEVACIZUMAB 2.5 MG/0.1ML IZ SOSY
2.5000 mg | PREFILLED_SYRINGE | INTRAVITREAL | Status: AC | PRN
  Administered 2021-07-27: 2.5 mg via INTRAVITREAL

## 2021-07-27 NOTE — Progress Notes (Signed)
07/27/2021     CHIEF COMPLAINT Patient presents for  Chief Complaint  Patient presents with   Macular Degeneration      HISTORY OF PRESENT ILLNESS: Donna Arias is a 88 y.o. female who presents to the clinic today for:   HPI   7 weeks for DILATE OD, AVATSIN, OCT. Pt stated no changes in vision since last visit.  Last edited by Silvestre Moment on 07/27/2021  9:43 AM.      Referring physician: Lorrene Reid, PA-C [place],   [id number]  HISTORICAL INFORMATION:   Selected notes from the MEDICAL RECORD NUMBER    Lab Results  Component Value Date   HGBA1C 5.5 11/28/2020     CURRENT MEDICATIONS: Current Outpatient Medications (Ophthalmic Drugs)  Medication Sig   erythromycin ophthalmic ointment Place 1 application into the left eye at bedtime.   No current facility-administered medications for this visit. (Ophthalmic Drugs)   Current Outpatient Medications (Other)  Medication Sig   albuterol (PROVENTIL) (2.5 MG/3ML) 0.083% nebulizer solution Take 3 mLs (2.5 mg total) by nebulization every 6 (six) hours as needed for wheezing or shortness of breath.   amLODipine (NORVASC) 2.5 MG tablet TAKE 1 TABLET (2.5 MG TOTAL) BY MOUTH DAILY.   aspirin 81 MG tablet Take 81 mg by mouth daily.   atorvastatin (LIPITOR) 10 MG tablet TAKE 1 TABLET BY MOUTH DAILY.   benzonatate (TESSALON) 100 MG capsule Take 1 capsule (100 mg total) by mouth 3 (three) times daily as needed for cough.   budesonide (PULMICORT) 0.25 MG/2ML nebulizer solution Take 2 mLs (0.25 mg total) by nebulization in the morning and at bedtime.   Calcium Carbonate-Vit D-Min (CALCIUM 1200 PO) Take 1 tablet by mouth.   fexofenadine (ALLEGRA) 180 MG tablet Take 180 mg by mouth daily.   FLUoxetine (PROZAC) 10 MG capsule TAKE 1 CAPSULE BY MOUTH DAILY.   fluticasone (FLONASE) 50 MCG/ACT nasal spray Place 1 spray into both nostrils 2 (two) times daily.   furosemide (LASIX) 20 MG tablet TAKE 1 TABLET BY  MOUTH DAILY.   ketoconazole (NIZORAL) 2 % shampoo APPLY TOPICALLY 2 TIMES A WEEK.   levothyroxine (SYNTHROID) 25 MCG tablet TAKE 1/2 TABLET BY MOUTH DAILY WITH 50 MCG TO EQUAL 62.5 MCG TOTAL DAILY   levothyroxine (SYNTHROID) 50 MCG tablet TAKE 1 TABLET BY MOUTH EVERY DAY   mupirocin ointment (BACTROBAN) 2 % APPLY TO AFFECTED AREA ON THE SKIN 2 TIMES A DAY   nystatin (MYCOSTATIN/NYSTOP) powder Apply 1 application topically 2 (two) times daily.   potassium chloride SA (KLOR-CON M) 20 MEQ tablet TAKE 1 TABLET BY MOUTH ONCE DAILY.   risperiDONE (RISPERDAL) 1 MG tablet TAKE 1 TABLET BY MOUTH 2 TIMES DAILY.   STIOLTO RESPIMAT 2.5-2.5 MCG/ACT AERS INHALE 2 PUFFS INTO THE LUNGS DAILY.   triamcinolone cream (KENALOG) 0.1 % Apply 1 application topically 2 (two) times daily. Request jar   Current Facility-Administered Medications (Other)  Medication Route   ipratropium-albuterol (DUONEB) 0.5-2.5 (3) MG/3ML nebulizer solution 3 mL Nebulization      REVIEW OF SYSTEMS: ROS   Negative for: Constitutional, Gastrointestinal, Neurological, Skin, Genitourinary, Musculoskeletal, HENT, Endocrine, Cardiovascular, Eyes, Respiratory, Psychiatric, Allergic/Imm, Heme/Lymph Last edited by Silvestre Moment on 07/27/2021  9:43 AM.       ALLERGIES Allergies  Allergen Reactions   Picato [Ingenol Mebutate] Hives   Codeine Sulfate Other (See Comments)    REACTION: unspecified    PAST MEDICAL HISTORY Past Medical History:  Diagnosis  Date   Chronic headaches    COPD (chronic obstructive pulmonary disease) (HCC)    Dementia (HCC)    Eye infection, left 01/16/2018   GERD (gastroesophageal reflux disease)    Heart attack ([place]) 1998   HLD (hyperlipidemia)    Hypertension    Skin cancer    squamous cell of left face   Stroke ([place])    Thyroid disease    Past Surgical History:  Procedure Laterality Date   BREAST LUMPECTOMY Left 1995   DILATION AND CURETTAGE OF UTERUS  1975   GALLBLADDER SURGERY  1993    FAMILY  HISTORY Family History  Problem Relation Age of Onset   Hypertension Mother    Stroke Mother    Stroke Sister    Alzheimer's disease Sister    Stroke Sister    Hypertension Sister    Stroke Sister    Breast cancer Sister     SOCIAL HISTORY Social History   Tobacco Use   Smoking status: Former    Types: Cigarettes    Quit date: 1995    Years since quitting: 28.5   Smokeless tobacco: Never  Vaping Use   Vaping Use: Never used  Substance Use Topics   Alcohol use: No   Drug use: No         OPHTHALMIC EXAM:  Base Eye Exam     Visual Acuity (ETDRS)       Right Left   Dist cc 20/50 -2 CF at 2'   Dist ph cc NI     Correction: Glasses         Tonometry (Tonopen, 9:49 AM)       Right Left   Pressure 11 12         Pupils       Pupils APD   Right PERRL None   Left PERRL None         Visual Fields       Left Right    Full Full         Extraocular Movement       Right Left    Full Full         Neuro/Psych     Oriented x3: Yes   Mood/Affect: Normal         Dilation     Right eye: 2.5% Phenylephrine, 1.0% Mydriacyl @ 9:49 AM           Slit Lamp and Fundus Exam     External Exam       Right Left   External Normal Normal         Slit Lamp Exam       Right Left   Lids/Lashes Normal Normal   Conjunctiva/Sclera White and quiet White and quiet   Cornea Clear Clear   Anterior Chamber Deep and quiet Deep and quiet   Iris Round and reactive Round and reactive   Lens Centered posterior chamber intraocular lens Centered posterior chamber intraocular lens   Anterior Vitreous Normal Normal         Fundus Exam       Right Left   Posterior Vitreous Posterior vitreous detachment    Disc Peripapillary atrophy    C/D Ratio 0.65    Macula Retinal pigment epithelial mottling, no macular thickening, Retinal atrophy, Early age related macular degeneration, no serous retinal detachment visible clinically, minor epiretinal  membrane not distorting    Vessels Normal    Periphery Normal  IMAGING AND PROCEDURES  Imaging and Procedures for 07/27/21  OCT, Retina - OU - Both Eyes       Right Eye Quality was good. Scan locations included subfoveal. Central Foveal Thickness: 286. Progression has improved. Findings include abnormal foveal contour.   Left Eye Quality was good. Scan locations included nasal. Central Foveal Thickness: 184. Progression has been stable. Findings include abnormal foveal contour, central retinal atrophy, inner retinal atrophy, outer retinal atrophy.   Notes Less subretinal fluid and CME perifoveal at 7-week interval.  OD  As compared to onset onset of disease July 2022..  We will repeat injection Avastin OD today and examination next in 7 weeks OS with macular atrophy from previous retinal vascular disease.  Stable overall     Intravitreal Injection, Pharmacologic Agent - OD - Right Eye       Time Out 07/27/2021. 10:28 AM. Confirmed correct patient, procedure, site, and patient consented.   Anesthesia Topical anesthesia was used. Anesthetic medications included Lidocaine 4%.   Procedure Preparation included 5% betadine to ocular surface, 10% betadine to eyelids, Tobramycin 0.3%, Ofloxacin . A 30 gauge needle was used.   Injection: 2.5 mg bevacizumab 2.5 MG/0.1ML   Route: Intravitreal, Site: Right Eye   NDC: [phone], Lot: [id number], Expiration date: 09/25/2021   Post-op Post injection exam found visual acuity of at least counting fingers. The patient tolerated the procedure well. There were no complications. The patient received written and verbal post procedure care education. Post injection medications included ocuflox.              ASSESSMENT/PLAN:  Exudative age-related macular degeneration of right eye with active choroidal neovascularization ([place]) 2022, at 7-week interval today post Avastin.  Preserved acuity.  Repeat injection OD today and  follow-up again 7 weeks  Exudative age-related macular degeneration of left eye with inactive choroidal neovascularization (HCC) Diffuse macular atrophy OS, no sign of CNVM recurrence  Intermediate stage nonexudative age-related macular degeneration of right eye No sign of geographic atrophy  Retinal macroaneurysm of left eye Condition resolved OS with no recurrence     ICD-10-CM   1. Exudative age-related macular degeneration of right eye with active choroidal neovascularization (HCC)  H35.3211 OCT, Retina - OU - Both Eyes    Intravitreal Injection, Pharmacologic Agent - OD - Right Eye    bevacizumab (AVASTIN) SOSY 2.5 mg    2. Exudative age-related macular degeneration of left eye with inactive choroidal neovascularization ([place])  H35.3222     3. Intermediate stage nonexudative age-related macular degeneration of right eye  H35.3112     4. Retinal macroaneurysm of left eye  H35.012       1.  2.  3.  Ophthalmic Meds Ordered this visit:  Meds ordered this encounter  Medications   bevacizumab (AVASTIN) SOSY 2.5 mg       Return in about 7 weeks (around 09/14/2021) for OD, AVASTIN OCT, DILATE OU.  There are no Patient Instructions on file for this visit.   Explained the diagnoses, plan, and follow up with the patient and they expressed understanding.  Patient expressed understanding of the importance of proper follow up care.   Clent Demark Eber Ferrufino M.D. Diseases & Surgery of the Retina and Vitreous Retina & Diabetic [place] 07/27/21     Abbreviations: M myopia (nearsighted); A astigmatism; H hyperopia (farsighted); P presbyopia; Mrx spectacle prescription;  CTL contact lenses; OD right eye; OS left eye; OU both eyes  XT exotropia; ET esotropia; PEK punctate epithelial keratitis; PEE  punctate epithelial erosions; DES dry eye syndrome; MGD meibomian gland dysfunction; ATs artificial tears; PFAT's preservative free artificial tears; [place] nuclear sclerotic cataract; PSC  posterior subcapsular cataract; ERM epi-retinal membrane; PVD posterior vitreous detachment; RD retinal detachment; DM diabetes mellitus; DR diabetic retinopathy; NPDR non-proliferative diabetic retinopathy; PDR proliferative diabetic retinopathy; CSME clinically significant macular edema; DME diabetic macular edema; dbh dot blot hemorrhages; CWS cotton wool spot; POAG primary open angle glaucoma; C/D cup-to-disc ratio; HVF humphrey visual field; GVF goldmann visual field; OCT optical coherence tomography; IOP intraocular pressure; BRVO Branch retinal vein occlusion; CRVO central retinal vein occlusion; CRAO central retinal artery occlusion; BRAO branch retinal artery occlusion; RT retinal tear; SB scleral buckle; PPV pars plana vitrectomy; VH Vitreous hemorrhage; PRP panretinal laser photocoagulation; IVK intravitreal kenalog; VMT vitreomacular traction; MH Macular hole;  NVD neovascularization of the disc; NVE neovascularization elsewhere; AREDS age related eye disease study; ARMD age related macular degeneration; POAG primary open angle glaucoma; EBMD epithelial/anterior basement membrane dystrophy; ACIOL anterior chamber intraocular lens; IOL intraocular lens; PCIOL posterior chamber intraocular lens; Phaco/IOL phacoemulsification with intraocular lens placement; Cisco photorefractive keratectomy; LASIK laser assisted in situ keratomileusis; HTN hypertension; DM diabetes mellitus; COPD chronic obstructive pulmonary disease

## 2021-07-27 NOTE — Assessment & Plan Note (Signed)
No sign of geographic atrophy

## 2021-07-27 NOTE — Assessment & Plan Note (Signed)
2022, at 7-week interval today post Avastin.  Preserved acuity.  Repeat injection OD today and follow-up again 7 weeks

## 2021-07-27 NOTE — Assessment & Plan Note (Signed)
Diffuse macular atrophy OS, no sign of CNVM recurrence

## 2021-07-27 NOTE — Assessment & Plan Note (Signed)
Condition resolved OS with no recurrence

## 2021-08-10 ENCOUNTER — Other Ambulatory Visit: Payer: Self-pay | Admitting: Physician Assistant

## 2021-08-10 DIAGNOSIS — J449 Chronic obstructive pulmonary disease, unspecified: Secondary | ICD-10-CM

## 2021-08-18 ENCOUNTER — Other Ambulatory Visit: Payer: Self-pay | Admitting: Physician Assistant

## 2021-08-19 ENCOUNTER — Other Ambulatory Visit: Payer: Self-pay | Admitting: Physician Assistant

## 2021-08-19 DIAGNOSIS — R21 Rash and other nonspecific skin eruption: Secondary | ICD-10-CM

## 2021-08-24 ENCOUNTER — Other Ambulatory Visit: Payer: Self-pay | Admitting: Physician Assistant

## 2021-08-24 DIAGNOSIS — Z760 Encounter for issue of repeat prescription: Secondary | ICD-10-CM

## 2021-08-25 ENCOUNTER — Ambulatory Visit (INDEPENDENT_AMBULATORY_CARE_PROVIDER_SITE_OTHER): Payer: Medicare HMO | Admitting: Physician Assistant

## 2021-08-25 ENCOUNTER — Encounter: Payer: Self-pay | Admitting: Physician Assistant

## 2021-08-25 VITALS — BP 112/64 | HR 83 | Temp 97.7°F | Ht 63.0 in | Wt 138.0 lb

## 2021-08-25 DIAGNOSIS — L729 Follicular cyst of the skin and subcutaneous tissue, unspecified: Secondary | ICD-10-CM | POA: Diagnosis not present

## 2021-08-25 MED ORDER — KETOCONAZOLE 2 % EX SHAM: MEDICATED_SHAMPOO | CUTANEOUS | 1 refills | Status: DC

## 2021-08-25 NOTE — Progress Notes (Signed)
Established patient acute visit   Patient: Donna Arias   DOB: [birth year]   88 y.o. Female  MRN: [id number] Visit Date: 08/25/2021  Chief Complaint  Patient presents with   Cyst   Subjective    HPI  Patient presents with her daughter for cyst on her face. Mass seemed to get larger the past week. No warmth or drainage. Patient states it does not hurt. Saw her dermatologist May or June of this year and had some lesions frozen-off. No fever or chills.   1 cm x 0.5 cm   Medications: Outpatient Medications Prior to Visit  Medication Sig   albuterol (PROVENTIL) (2.5 MG/3ML) 0.083% nebulizer solution Take 3 mLs (2.5 mg total) by nebulization every 6 (six) hours as needed for wheezing or shortness of breath.   amLODipine (NORVASC) 2.5 MG tablet TAKE 1 TABLET (2.5 MG TOTAL) BY MOUTH DAILY.   aspirin 81 MG tablet Take 81 mg by mouth daily.   atorvastatin (LIPITOR) 10 MG tablet TAKE 1 TABLET BY MOUTH DAILY.   benzonatate (TESSALON) 100 MG capsule Take 1 capsule (100 mg total) by mouth 3 (three) times daily as needed for cough.   budesonide (PULMICORT) 0.25 MG/2ML nebulizer solution Take 2 mLs (0.25 mg total) by nebulization in the morning and at bedtime.   Calcium Carbonate-Vit D-Min (CALCIUM 1200 PO) Take 1 tablet by mouth.   erythromycin ophthalmic ointment Place 1 application into the left eye at bedtime.   fexofenadine (ALLEGRA) 180 MG tablet Take 180 mg by mouth daily.   FLUoxetine (PROZAC) 10 MG capsule TAKE 1 CAPSULE BY MOUTH DAILY.   fluticasone (FLONASE) 50 MCG/ACT nasal spray Place 1 spray into both nostrils 2 (two) times daily.   furosemide (LASIX) 20 MG tablet TAKE 1 TABLET BY MOUTH DAILY.   levothyroxine (SYNTHROID) 25 MCG tablet TAKE 1/2 TABLET BY MOUTH DAILY WITH 50 MCG TO EQUAL 62.5 MCG TOTAL DAILY   levothyroxine (SYNTHROID) 50 MCG tablet TAKE 1 TABLET BY MOUTH EVERY DAY   mupirocin ointment (BACTROBAN) 2 % APPLY TO AFFECTED AREA ON THE SKIN 2 TIMES A DAY   nystatin  (MYCOSTATIN/NYSTOP) powder Apply 1 application topically 2 (two) times daily.   potassium chloride SA (KLOR-CON M) 20 MEQ tablet TAKE 1 TABLET BY MOUTH ONCE DAILY.   risperiDONE (RISPERDAL) 1 MG tablet TAKE 1 TABLET BY MOUTH 2 TIMES DAILY.   STIOLTO RESPIMAT 2.5-2.5 MCG/ACT AERS INHALE 2 PUFFS INTO THE LUNGS DAILY.   triamcinolone cream (KENALOG) 0.1 % Apply 1 application topically 2 (two) times daily. Request jar   [DISCONTINUED] ketoconazole (NIZORAL) 2 % shampoo APPLY TOPICALLY 2 TIMES A WEEK.   Facility-Administered Medications Prior to Visit  Medication Dose Route Frequency Provider   ipratropium-albuterol (DUONEB) 0.5-2.5 (3) MG/3ML nebulizer solution 3 mL  3 mL Nebulization Q6H Danford, Katy D, NP    Review of Systems Review of Systems:  A fourteen system review of systems was performed and found to be positive as per HPI.     Objective    BP 112/64   Pulse 83   Temp 97.7 F (36.5 C)   Ht '5\' 3"'$  (1.6 m)   Wt 138 lb (62.6 kg)   SpO2 98%   BMI 24.45 kg/m    Physical Exam  General:  Pleasant and cooperative, appropriate for stated age.  Neuro:  Alert and oriented,  extra-ocular muscles intact  HEENT:  Normocephalic, atraumatic, neck supple  Skin:  1 cm x 0.5 cm firm and mobile mass left upper  cheek without tenderness or redness  Cardiac:  RRR, S1 S2 Respiratory: CTA B/L  Vascular:  Ext warm, no cyanosis apprec.; cap RF less 2 sec. Psych:  No HI/SI, judgement and insight good, Euthymic mood. Full Affect.   No results found for any visits on 08/25/21.  Assessment & Plan     Discussed with patient and daughter mass is consistent with cyst. Recommend to keep area clean and dry, avoid scratching. There are no signs of infection or abscess formation so recommend to monitor for now. If cyst changes or becomes bothersome recommend to follow-up with dermatology for removal. Both verbalized understanding.    Return if symptoms worsen or fail to improve.        Lorrene Reid, PA-C  [facility] at [facility] [phone] (phone) [phone] (fax)  [place]

## 2021-08-25 NOTE — Patient Instructions (Signed)
Epidermoid Cyst  An epidermoid cyst, also known as epidermal cyst, is a sac made of skin tissue. The sac contains a substance called keratin. Keratin is a protein that is normally secreted through the hair follicles. When keratin becomes trapped in the top layer of skin (epidermis), it can form an epidermoid cyst. Epidermoid cysts can be found anywhere on your body. These cysts are usually harmless (benign), and they may not cause symptoms unless they become inflamed or infected. What are the causes? This condition may be caused by: A blocked hair follicle. A hair that curls and re-enters the skin instead of growing straight out of the skin (ingrown hair). A blocked pore. Irritated skin. An injury to the skin. Certain conditions that are passed along from parent to child (inherited). Human papillomavirus (HPV). This happens rarely when cysts occur on the bottom of the feet. Long-term (chronic) sun damage to the skin. What increases the risk? The following factors may make you more likely to develop an epidermoid cyst: Having acne. Being female. Having an injury to the skin. Being past puberty. Having certain rare genetic disorders. What are the signs or symptoms? The only symptom of this condition may be a small, painless lump underneath the skin. When an epidermal cyst ruptures, it may become inflamed. True infection in cysts is rare. Symptoms may include: Redness. Inflammation. Tenderness. Warmth. Keratin draining from the cyst. Keratin is grayish-white, bad-smelling substance. Pus draining from the cyst. How is this diagnosed? This condition is diagnosed with a physical exam. In some cases, you may have a sample of tissue (biopsy) taken from your cyst to be examined under a microscope or tested for bacteria. You may be referred to a health care provider who specializes in skin care (dermatologist). How is this treated? If a cyst becomes inflamed, treatment may include: Opening and  draining the cyst, done by a health care provider. After draining, minor surgery to remove the rest of the cyst may be done. Taking antibiotic medicine. Having injections of medicines (steroids) that help to reduce inflammation. Having surgery to remove the cyst. Surgery may be done if the cyst: Becomes large. Bothers you. Has a chance of turning into cancer. Do not try to open a cyst yourself. Follow these instructions at home: Medicines If you were prescribed an antibiotic medicine, take it it as told by your health care provider. Do not stop using the antibiotic even if you start to feel better. Take over-the-counter and prescription medicines only as told by your health care provider. General instructions Keep the area around your cyst clean and dry. Wear loose, dry clothing. Avoid touching your cyst. Check your cyst every day for signs of infection. Check for: Redness, swelling, or pain. Fluid or blood. Warmth. Pus or a bad smell. Keep all follow-up visits. This is important. How is this prevented? Wear clean, dry, clothing. Avoid wearing tight clothing. Keep your skin clean and dry. Take showers or baths every day. Contact a health care provider if: Your cyst develops symptoms of infection. Your condition is not improving or is getting worse. You develop a cyst that looks different from other cysts you have had. You have a fever. Get help right away if: Redness spreads from the cyst into the surrounding area. Summary An epidermoid cyst is a sac made of skin tissue. These cysts are usually harmless (benign), and they may not cause symptoms unless they become inflamed. If a cyst becomes inflamed, treatment may include surgery to open and drain the   cyst, or to remove it. Treatment may also include medicines by mouth or through an injection. Take over-the-counter and prescription medicines only as told by your health care provider. If you were prescribed an antibiotic medicine,  take it as told by your health care provider. Do not stop using the antibiotic even if you start to feel better. Contact a health care provider if your condition is not improving or is getting worse. Keep all follow-up visits as told by your health care provider. This is important. This information is not intended to replace advice given to you by your health care provider. Make sure you discuss any questions you have with your health care provider. Document Revised: 04/18/2019 Document Reviewed: 04/18/2019 Elsevier Patient Education  2023 [employer]vier Inc.  

## 2021-09-09 ENCOUNTER — Ambulatory Visit (INDEPENDENT_AMBULATORY_CARE_PROVIDER_SITE_OTHER): Payer: Medicare HMO | Admitting: Ophthalmology

## 2021-09-09 ENCOUNTER — Encounter (INDEPENDENT_AMBULATORY_CARE_PROVIDER_SITE_OTHER): Payer: Self-pay | Admitting: Ophthalmology

## 2021-09-09 DIAGNOSIS — H43811 Vitreous degeneration, right eye: Secondary | ICD-10-CM

## 2021-09-09 DIAGNOSIS — H353211 Exudative age-related macular degeneration, right eye, with active choroidal neovascularization: Secondary | ICD-10-CM | POA: Diagnosis not present

## 2021-09-09 DIAGNOSIS — H35012 Changes in retinal vascular appearance, left eye: Secondary | ICD-10-CM | POA: Diagnosis not present

## 2021-09-09 DIAGNOSIS — H353124 Nonexudative age-related macular degeneration, left eye, advanced atrophic with subfoveal involvement: Secondary | ICD-10-CM | POA: Diagnosis not present

## 2021-09-09 DIAGNOSIS — H353222 Exudative age-related macular degeneration, left eye, with inactive choroidal neovascularization: Secondary | ICD-10-CM | POA: Diagnosis not present

## 2021-09-09 NOTE — Assessment & Plan Note (Signed)
Resolved

## 2021-09-09 NOTE — Assessment & Plan Note (Signed)

## 2021-09-09 NOTE — Assessment & Plan Note (Signed)
Geographic atrophy persists centrally OS accounts for acuity

## 2021-09-09 NOTE — Assessment & Plan Note (Signed)
OD, improved macular findings, thickening superonasal to FAZ is improved nicely.  Most recent onset of activity was November 2021 with subretinal and intraretinal  fluid doing very well now.

## 2021-09-09 NOTE — Progress Notes (Signed)
09/09/2021     CHIEF COMPLAINT Patient presents for  Chief Complaint  Patient presents with   Macular Degeneration      HISTORY OF PRESENT ILLNESS: Donna Arias is a 88 y.o. female who presents to the clinic today for:   HPI   7weeks dilate ou avastin od oct Pt states her vision has been stable Pt denies any new floaters but see's FOL in her right eye  Last edited by Morene Rankins, CMA on 09/09/2021  9:43 AM.      Referring physician: Lorrene Reid, PA-C [address] [place],  [place] [address]  HISTORICAL INFORMATION:   Selected notes from the MEDICAL RECORD NUMBER    Lab Results  Component Value Date   HGBA1C 5.5 11/28/2020     CURRENT MEDICATIONS: Current Outpatient Medications (Ophthalmic Drugs)  Medication Sig   erythromycin ophthalmic ointment Place 1 application into the left eye at bedtime.   No current facility-administered medications for this visit. (Ophthalmic Drugs)   Current Outpatient Medications (Other)  Medication Sig   albuterol (PROVENTIL) (2.5 MG/3ML) 0.083% nebulizer solution Take 3 mLs (2.5 mg total) by nebulization every 6 (six) hours as needed for wheezing or shortness of breath.   amLODipine (NORVASC) 2.5 MG tablet TAKE 1 TABLET (2.5 MG TOTAL) BY MOUTH DAILY.   aspirin 81 MG tablet Take 81 mg by mouth daily.   atorvastatin (LIPITOR) 10 MG tablet TAKE 1 TABLET BY MOUTH DAILY.   benzonatate (TESSALON) 100 MG capsule Take 1 capsule (100 mg total) by mouth 3 (three) times daily as needed for cough.   budesonide (PULMICORT) 0.25 MG/2ML nebulizer solution Take 2 mLs (0.25 mg total) by nebulization in the morning and at bedtime.   Calcium Carbonate-Vit D-Min (CALCIUM 1200 PO) Take 1 tablet by mouth.   fexofenadine (ALLEGRA) 180 MG tablet Take 180 mg by mouth daily.   FLUoxetine (PROZAC) 10 MG capsule TAKE 1 CAPSULE BY MOUTH DAILY.   fluticasone (FLONASE) 50 MCG/ACT nasal spray Place 1 spray into both nostrils 2 (two) times  daily.   furosemide (LASIX) 20 MG tablet TAKE 1 TABLET BY MOUTH DAILY.   ketoconazole (NIZORAL) 2 % shampoo APPLY TOPICALLY 2 TIMES A WEEK.   levothyroxine (SYNTHROID) 25 MCG tablet TAKE 1/2 TABLET BY MOUTH DAILY WITH 50 MCG TO EQUAL 62.5 MCG TOTAL DAILY   levothyroxine (SYNTHROID) 50 MCG tablet TAKE 1 TABLET BY MOUTH EVERY DAY   mupirocin ointment (BACTROBAN) 2 % APPLY TO AFFECTED AREA ON THE SKIN 2 TIMES A DAY   nystatin (MYCOSTATIN/NYSTOP) powder Apply 1 application topically 2 (two) times daily.   potassium chloride SA (KLOR-CON M) 20 MEQ tablet TAKE 1 TABLET BY MOUTH ONCE DAILY.   risperiDONE (RISPERDAL) 1 MG tablet TAKE 1 TABLET BY MOUTH 2 TIMES DAILY.   STIOLTO RESPIMAT 2.5-2.5 MCG/ACT AERS INHALE 2 PUFFS INTO THE LUNGS DAILY.   triamcinolone cream (KENALOG) 0.1 % Apply 1 application topically 2 (two) times daily. Request jar   Current Facility-Administered Medications (Other)  Medication Route   ipratropium-albuterol (DUONEB) 0.5-2.5 (3) MG/3ML nebulizer solution 3 mL Nebulization      REVIEW OF SYSTEMS: ROS   Negative for: Constitutional, Gastrointestinal, Neurological, Skin, Genitourinary, Musculoskeletal, HENT, Endocrine, Cardiovascular, Eyes, Respiratory, Psychiatric, Allergic/Imm, Heme/Lymph Last edited by Morene Rankins, CMA on 09/09/2021  9:43 AM.       ALLERGIES Allergies  Allergen Reactions   Picato [Ingenol Mebutate] Hives   Codeine Sulfate Other (See Comments)  REACTION: unspecified    PAST MEDICAL HISTORY Past Medical History:  Diagnosis Date   Chronic headaches    COPD (chronic obstructive pulmonary disease) (HCC)    Dementia (HCC)    Eye infection, left 01/16/2018   GERD (gastroesophageal reflux disease)    Heart attack ([place]) 1998   HLD (hyperlipidemia)    Hypertension    Skin cancer    squamous cell of left face   Stroke ([place])    Thyroid disease    Past Surgical History:  Procedure Laterality Date   BREAST LUMPECTOMY Left 1995    DILATION AND CURETTAGE OF UTERUS  1975   GALLBLADDER SURGERY  1993    FAMILY HISTORY Family History  Problem Relation Age of Onset   Hypertension Mother    Stroke Mother    Stroke Sister    Alzheimer's disease Sister    Stroke Sister    Hypertension Sister    Stroke Sister    Breast cancer Sister     SOCIAL HISTORY Social History   Tobacco Use   Smoking status: Former    Types: Cigarettes    Quit date: 1995    Years since quitting: 28.6   Smokeless tobacco: Never  Vaping Use   Vaping Use: Never used  Substance Use Topics   Alcohol use: No   Drug use: No         OPHTHALMIC EXAM:  Base Eye Exam     Visual Acuity (ETDRS)       Right Left   Dist cc 20/40 CF at 2         Tonometry (Tonopen, 9:49 AM)       Right Left   Pressure 13 12         Neuro/Psych     Oriented x3: Yes   Mood/Affect: Normal         Dilation     Both eyes: 1.0% Mydriacyl, 2.5% Phenylephrine @ 9:44 AM           Slit Lamp and Fundus Exam     External Exam       Right Left   External Normal Normal         Slit Lamp Exam       Right Left   Lids/Lashes Normal Normal   Conjunctiva/Sclera White and quiet White and quiet   Cornea Clear Clear   Anterior Chamber Deep and quiet Deep and quiet   Iris Round and reactive Round and reactive   Lens Centered posterior chamber intraocular lens Centered posterior chamber intraocular lens   Anterior Vitreous Normal Normal         Fundus Exam       Right Left   Posterior Vitreous Posterior vitreous detachment Posterior vitreous detachment   Disc Peripapillary atrophy Peripapillary atrophy   C/D Ratio 0.65 0.6   Macula Retinal pigment epithelial mottling, no macular thickening, Retinal atrophy, Early age related macular degeneration, no serous retinal detachment visible clinically, minor epiretinal membrane not distorting Advanced age related macular degeneration, Geographic atrophy 1.5 - 2.0 disc areas foveal atrophy, no  macular thickening, no hemorrhage   Vessels Normal Normal   Periphery Normal Normal            IMAGING AND PROCEDURES  Imaging and Procedures for 09/10/21  OCT, Retina - OU - Both Eyes       Right Eye Quality was good. Scan locations included subfoveal. Central Foveal Thickness: 283. Progression has improved. Findings include abnormal foveal contour.  Left Eye Quality was good. Scan locations included nasal. Central Foveal Thickness: 184. Progression has been stable. Findings include abnormal foveal contour, central retinal atrophy, inner retinal atrophy, outer retinal atrophy.   Notes Less subretinal fluid and CME perifoveal at 7-week interval.  OD  As compared to onset onset of disease July 2022..  We will repeat injection Avastin OD today and examination next in 7 weeks OS with macular atrophy from previous retinal vascular disease.  Stable overall     Intravitreal Injection, Pharmacologic Agent - OD - Right Eye       Time Out 09/09/2021. 8:28 AM. Confirmed correct patient, procedure, site, and patient consented.   Anesthesia Topical anesthesia was used. Anesthetic medications included Lidocaine 4%.   Procedure Preparation included 5% betadine to ocular surface, 10% betadine to eyelids, Tobramycin 0.3%, Ofloxacin . A 30 gauge needle was used.   Injection: 1.25 mg Bevacizumab 1.'25mg'$ /0.110m   Route: Intravitreal, Site: Right Eye   NDC: [id number] Lot:: [id number] Expiration date: 12/09/2021   Post-op Post injection exam found visual acuity of at least counting fingers. The patient tolerated the procedure well. There were no complications. The patient received written and verbal post procedure care education. Post injection medications included ocuflox.              ASSESSMENT/PLAN:  Advanced nonexudative age-related macular degeneration of left eye with subfoveal involvement Geographic atrophy persists centrally OS accounts for acuity  Exudative  age-related macular degeneration of left eye with inactive choroidal neovascularization (HCC) No sign of CNVM OS  Exudative age-related macular degeneration of right eye with active choroidal neovascularization (HCC) OD, improved macular findings, thickening superonasal to FAZ is improved nicely.  Most recent onset of activity was November 2021 with subretinal and intraretinal  fluid doing very well now.  Posterior vitreous detachment of right eye  The nature of posterior vitreous detachment was discussed with the patient as well as its physiology, its age prevalence, and its possible implication regarding retinal breaks and detachment.  An informational brochure was offered to the patient.  All the patient's questions were answered.  The patient was asked to return if new or different flashes or floaters develops.   Patient was instructed to contact office immediately if any new changes were noticed. I explained to the patient that vitreous inside the eye is similar to jello inside a bowl. As the jello melts it can start to pull away from the bowl, similarly the vitreous throughout our lives can begin to pull away from the retina. That process is called a posterior vitreous detachment. In some cases, the vitreous can tug hard enough on the retina to form a retinal tear. I discussed with the patient the signs and symptoms of a retinal detachment.  Do not rub the eye.    Retinal macroaneurysm of left eye Resolved     ICD-10-CM   1. Exudative age-related macular degeneration of right eye with active choroidal neovascularization (HCC)  H35.3211 OCT, Retina - OU - Both Eyes    Intravitreal Injection, Pharmacologic Agent - OD - Right Eye    Bevacizumab (AVASTIN) SOLN 1.25 mg    2. Advanced nonexudative age-related macular degeneration of left eye with subfoveal involvement  H35.3124     3. Exudative age-related macular degeneration of left eye with inactive choroidal neovascularization ([place]   H35.3222     4. Posterior vitreous detachment of right eye  H43.811     5. Retinal macroaneurysm of left eye  H35.012  1.  OD doing very well now over 2 years on therapy for wet AMD subfoveal and extra foveal.  Today at 7-week interval follow-up.  Extend interval next examination to 9 weeks.  2.  OS with PVD, and also resolved retinal macro aneurysm  3.  OS with geographic atrophy accounts for acuity centrally  Ophthalmic Meds Ordered this visit:  Meds ordered this encounter  Medications   Bevacizumab (AVASTIN) SOLN 1.25 mg       Return in about 9 weeks (around 11/11/2021) for dilate, OD, AVASTIN OCT.  There are no Patient Instructions on file for this visit.   Explained the diagnoses, plan, and follow up with the patient and they expressed understanding.  Patient expressed understanding of the importance of proper follow up care.   Clent Demark Zafirah Vanzee M.D. Diseases & Surgery of the Retina and Vitreous [facility] [place] 09/10/21     Abbreviations: M myopia (nearsighted); A astigmatism; H hyperopia (farsighted); P presbyopia; Mrx spectacle prescription;  CTL contact lenses; OD right eye; OS left eye; OU both eyes  XT exotropia; ET esotropia; PEK punctate epithelial keratitis; PEE punctate epithelial erosions; DES dry eye syndrome; MGD meibomian gland dysfunction; ATs artificial tears; PFAT's preservative free artificial tears; [place] nuclear sclerotic cataract; PSC posterior subcapsular cataract; ERM epi-retinal membrane; PVD posterior vitreous detachment; RD retinal detachment; DM diabetes mellitus; DR diabetic retinopathy; NPDR non-proliferative diabetic retinopathy; PDR proliferative diabetic retinopathy; CSME clinically significant macular edema; DME diabetic macular edema; dbh dot blot hemorrhages; CWS cotton wool spot; POAG primary open angle glaucoma; C/D cup-to-disc ratio; HVF humphrey visual field; GVF goldmann visual field; OCT optical coherence tomography; IOP  intraocular pressure; BRVO Branch retinal vein occlusion; CRVO central retinal vein occlusion; CRAO central retinal artery occlusion; BRAO branch retinal artery occlusion; RT retinal tear; SB scleral buckle; PPV pars plana vitrectomy; VH Vitreous hemorrhage; PRP panretinal laser photocoagulation; IVK intravitreal kenalog; VMT vitreomacular traction; MH Macular hole;  NVD neovascularization of the disc; NVE neovascularization elsewhere; AREDS age related eye disease study; ARMD age related macular degeneration; POAG primary open angle glaucoma; EBMD epithelial/anterior basement membrane dystrophy; ACIOL anterior chamber intraocular lens; IOL intraocular lens; PCIOL posterior chamber intraocular lens; Phaco/IOL phacoemulsification with intraocular lens placement; [place] photorefractive keratectomy; LASIK laser assisted in situ keratomileusis; HTN hypertension; DM diabetes mellitus; COPD chronic obstructive pulmonary disease

## 2021-09-09 NOTE — Assessment & Plan Note (Signed)
No sign of CNVM OS 

## 2021-09-10 DIAGNOSIS — H43811 Vitreous degeneration, right eye: Secondary | ICD-10-CM | POA: Diagnosis not present

## 2021-09-10 DIAGNOSIS — H353222 Exudative age-related macular degeneration, left eye, with inactive choroidal neovascularization: Secondary | ICD-10-CM | POA: Diagnosis not present

## 2021-09-10 DIAGNOSIS — H35012 Changes in retinal vascular appearance, left eye: Secondary | ICD-10-CM | POA: Diagnosis not present

## 2021-09-10 DIAGNOSIS — H353124 Nonexudative age-related macular degeneration, left eye, advanced atrophic with subfoveal involvement: Secondary | ICD-10-CM | POA: Diagnosis not present

## 2021-09-10 DIAGNOSIS — H353211 Exudative age-related macular degeneration, right eye, with active choroidal neovascularization: Secondary | ICD-10-CM | POA: Diagnosis not present

## 2021-09-10 MED ORDER — BEVACIZUMAB CHEMO INJECTION 1.25MG/0.05ML SYRINGE FOR KALEIDOSCOPE
1.2500 mg | INTRAVITREAL | Status: AC | PRN
  Administered 2021-09-10: 1.25 mg via INTRAVITREAL

## 2021-09-10 NOTE — Addendum Note (Signed)
Addended by: Deloria Lair A on: 09/10/2021 12:29 PM   Modules accepted: Orders

## 2021-09-14 ENCOUNTER — Encounter (INDEPENDENT_AMBULATORY_CARE_PROVIDER_SITE_OTHER): Payer: Medicare HMO | Admitting: Ophthalmology

## 2021-09-23 ENCOUNTER — Encounter: Payer: Self-pay | Admitting: Physician Assistant

## 2021-09-23 ENCOUNTER — Ambulatory Visit (INDEPENDENT_AMBULATORY_CARE_PROVIDER_SITE_OTHER): Payer: Medicare HMO | Admitting: Physician Assistant

## 2021-09-23 VITALS — BP 107/70 | HR 86 | Temp 97.7°F | Ht 63.0 in | Wt 137.0 lb

## 2021-09-23 DIAGNOSIS — L729 Follicular cyst of the skin and subcutaneous tissue, unspecified: Secondary | ICD-10-CM

## 2021-09-23 NOTE — Patient Instructions (Signed)
Excision of Skin Lesions, Care After The following information offers guidance on how to care for yourself after your procedure. Your health care provider may also give you more specific instructions. If you have problems or questions, contact your health care provider. What can I expect after the procedure? After your procedure, it is common to have: Soreness or mild pain. Some redness and swelling. Follow these instructions at home: Excision site care  Follow instructions from your health care provider about how to take care of your excision site. Make sure you: Wash your hands with soap and water for at least 20 seconds before and after you change your bandage (dressing). If soap and water are not available, use hand sanitizer. Change your dressing as told by your health care provider. Leave stitches (sutures), skin glue, or adhesive strips in place. These skin closures may need to stay in place for 2 weeks or longer. If adhesive strip edges start to loosen and curl up, you may trim the loose edges. Do not remove adhesive strips completely unless your health care provider tells you to do that. Check the excision area every day for signs of infection. Watch for: More redness, swelling, or pain. Fluid or blood. Warmth. Pus or a bad smell. Keep the site clean, dry, and protected for at least 48 hours. For bleeding, apply gentle but firm pressure to the area using a folded towel for 20 minutes. Do not take baths, swim, or use a hot tub until your health care provider approves. Ask your health care provider if you may take showers. You may only be allowed to take sponge baths. General instructions Take over-the-counter and prescription medicines only as told by your health care provider. Follow instructions from your health care provider about how to minimize scarring. Scarring should lessen over time. Avoid sun exposure until the area has healed. Use sunscreen to protect the area from the sun  after it has healed. Avoid high-impact exercise and activities until the sutures are removed or the area heals. Keep all follow-up visits. This is important. Contact a health care provider if: You have more redness, swelling, or pain around your excision site. You have fluid or blood coming from your excision site. Your excision site feels warm to the touch. You have pus or a bad smell coming from your excision site. You have a fever. You have pain that does not improve in 2-3 days after your procedure. Get help right away if: You have bleeding that does not stop with pressure or a dressing. Your wound opens up. Summary Take over-the-counter and prescription medicines only as told by your health care provider. Change your dressing as told by your health care provider. Contact a health care provider if you have redness, swelling, pain, or other signs of infection around your excision site. Keep all follow-up visits. This is important. This information is not intended to replace advice given to you by your health care provider. Make sure you discuss any questions you have with your health care provider. Document Revised: 08/12/2020 Document Reviewed: 08/12/2020 Elsevier Patient Education  2023 Elsevier Inc.  

## 2021-09-23 NOTE — Progress Notes (Signed)
Established patient visit   Patient: Donna Arias   DOB: 1926/10/07   88 y.o. Female  MRN: [id number] Visit Date: 09/23/2021  Chief Complaint  Patient presents with   Follow-up   Subjective    HPI  Patient presents with her daughter who are concerned that her cyst might have grew in size and wanted to double check before reaching out to her dermatologist. Reports one of patient's sisters was recently diagnosed with basal cell carcinoma. Patient reports cyst does not bother her at all. No changes with cyst except size.      Medications: Outpatient Medications Prior to Visit  Medication Sig   albuterol (PROVENTIL) (2.5 MG/3ML) 0.083% nebulizer solution Take 3 mLs (2.5 mg total) by nebulization every 6 (six) hours as needed for wheezing or shortness of breath.   amLODipine (NORVASC) 2.5 MG tablet TAKE 1 TABLET (2.5 MG TOTAL) BY MOUTH DAILY.   aspirin 81 MG tablet Take 81 mg by mouth daily.   atorvastatin (LIPITOR) 10 MG tablet TAKE 1 TABLET BY MOUTH DAILY.   benzonatate (TESSALON) 100 MG capsule Take 1 capsule (100 mg total) by mouth 3 (three) times daily as needed for cough.   budesonide (PULMICORT) 0.25 MG/2ML nebulizer solution Take 2 mLs (0.25 mg total) by nebulization in the morning and at bedtime.   Calcium Carbonate-Vit D-Min (CALCIUM 1200 PO) Take 1 tablet by mouth.   erythromycin ophthalmic ointment Place 1 application into the left eye at bedtime.   fexofenadine (ALLEGRA) 180 MG tablet Take 180 mg by mouth daily.   FLUoxetine (PROZAC) 10 MG capsule TAKE 1 CAPSULE BY MOUTH DAILY.   fluticasone (FLONASE) 50 MCG/ACT nasal spray Place 1 spray into both nostrils 2 (two) times daily.   furosemide (LASIX) 20 MG tablet TAKE 1 TABLET BY MOUTH DAILY.   ketoconazole (NIZORAL) 2 % shampoo APPLY TOPICALLY 2 TIMES A WEEK.   levothyroxine (SYNTHROID) 25 MCG tablet TAKE 1/2 TABLET BY MOUTH DAILY WITH 50 MCG TO EQUAL 62.5 MCG TOTAL DAILY   levothyroxine (SYNTHROID) 50 MCG tablet TAKE 1  TABLET BY MOUTH EVERY DAY   mupirocin ointment (BACTROBAN) 2 % APPLY TO AFFECTED AREA ON THE SKIN 2 TIMES A DAY   nystatin (MYCOSTATIN/NYSTOP) powder Apply 1 application topically 2 (two) times daily.   potassium chloride SA (KLOR-CON M) 20 MEQ tablet TAKE 1 TABLET BY MOUTH ONCE DAILY.   risperiDONE (RISPERDAL) 1 MG tablet TAKE 1 TABLET BY MOUTH 2 TIMES DAILY.   STIOLTO RESPIMAT 2.5-2.5 MCG/ACT AERS INHALE 2 PUFFS INTO THE LUNGS DAILY.   triamcinolone cream (KENALOG) 0.1 % Apply 1 application topically 2 (two) times daily. Request jar   Facility-Administered Medications Prior to Visit  Medication Dose Route Frequency Provider   ipratropium-albuterol (DUONEB) 0.5-2.5 (3) MG/3ML nebulizer solution 3 mL  3 mL Nebulization Q6H Danford, Katy D, NP    Review of Systems Review of Systems:  A fourteen system review of systems was performed and found to be positive as per HPI.  Last CBC Lab Results  Component Value Date   WBC 6.7 11/28/2020   HGB 13.7 11/28/2020   HCT 42.7 11/28/2020   MCV 95 11/28/2020   MCH 30.5 11/28/2020   RDW 14.3 11/28/2020   PLT 167 15/40/0867   Last metabolic panel Lab Results  Component Value Date   GLUCOSE 113 (H) 06/02/2021   NA 143 06/02/2021   K 3.7 06/02/2021   CL 102 06/02/2021   CO2 24 06/02/2021   BUN 21 06/02/2021  CREATININE 1.17 (H) 06/02/2021   EGFR 44 (L) 06/02/2021   CALCIUM 9.7 06/02/2021   PROT 6.3 06/02/2021   ALBUMIN 3.9 06/02/2021   LABGLOB 2.4 06/02/2021   AGRATIO 1.6 06/02/2021   BILITOT 0.4 06/02/2021   ALKPHOS 93 06/02/2021   AST 22 06/02/2021   ALT 14 06/02/2021   Last lipids Lab Results  Component Value Date   CHOL 149 11/28/2020   HDL 64 11/28/2020   LDLCALC 70 11/28/2020   LDLDIRECT 148.4 04/14/2012   TRIG 75 11/28/2020   CHOLHDL 2.3 11/28/2020   Last hemoglobin A1c Lab Results  Component Value Date   HGBA1C 5.5 11/28/2020   Last thyroid functions Lab Results  Component Value Date   TSH 1.940 06/02/2021    Last vitamin D Lab Results  Component Value Date   VD25OH 37.5 01/20/2017     Objective    BP 107/70   Pulse 86   Temp 97.7 F (36.5 C)   Ht '5\' 3"'  (1.6 m)   Wt 137 lb (62.1 kg)   SpO2 98%   BMI 24.27 kg/m  BP Readings from Last 3 Encounters:  09/23/21 107/70  08/25/21 112/64  06/02/21 106/68   Wt Readings from Last 3 Encounters:  09/23/21 137 lb (62.1 kg)  08/25/21 138 lb (62.6 kg)  06/02/21 138 lb (62.6 kg)    Physical Exam  General:  Pleasant and cooperative, appropriate for stated age.  Neuro:  Alert and oriented,  extra-ocular muscles intact  HEENT:  Normocephalic, atraumatic, neck supple  Skin:  ~ 1.5 cm x 1 cm firm mass at upper check left sided Respiratory: Speaking in full sentences, unlabored.  Vascular:  Ext warm, no cyanosis apprec. Psych:  No HI/SI, judgement and insight good, Euthymic mood. Full Affect.   No results found for any visits on 09/23/21.  Assessment & Plan      Problem List Items Addressed This Visit   None Visit Diagnoses     Skin cyst    -  Primary      Discussed with patient and daughter mass has mildly increased from prior visit. Recommend to follow-up with dermatology, is an established patient with [facility].    No follow-ups on file.        Lorrene Reid, PA-C  [facility] at [facility] [phone] (phone) [phone] (fax)  [place]

## 2021-09-24 ENCOUNTER — Other Ambulatory Visit: Payer: Self-pay | Admitting: Physician Assistant

## 2021-10-01 ENCOUNTER — Ambulatory Visit: Payer: Medicare HMO | Admitting: Dermatology

## 2021-10-01 DIAGNOSIS — L720 Epidermal cyst: Secondary | ICD-10-CM

## 2021-10-01 DIAGNOSIS — L578 Other skin changes due to chronic exposure to nonionizing radiation: Secondary | ICD-10-CM | POA: Diagnosis not present

## 2021-10-01 NOTE — Progress Notes (Unsigned)
   Follow-Up Visit   Subjective  Donna Arias is a 88 y.o. female who presents for the following: Growing nodule (Of the L zygoma - irregular appearing and growing fast. Daughter is concerned lesion may be cancerous). The patient has spots, moles and lesions to be evaluated, some may be new or changing and the patient has concerns that these could be cancer.  Patient accompanied by daughter who contributes to history.  The following portions of the chart were reviewed this encounter and updated as appropriate:   Tobacco  Allergies  Meds  Problems  Med Hx  Surg Hx  Fam Hx     Review of Systems:  No other skin or systemic complaints except as noted in HPI or Assessment and Plan.  Objective  Well appearing patient in no apparent distress; mood and affect are within normal limits.  A focused examination was performed including the face. Relevant physical exam findings are noted in the Assessment and Plan.  L zygoma 1.2 cm firm SQ nodule.    Assessment & Plan  Epidermal inclusion cyst L zygoma  Benign-appearing. Exam most consistent with an epidermal inclusion cyst. Discussed that a cyst is a benign growth that can grow over time and sometimes get irritated or inflamed. Recommend observation if it is not bothersome. Discussed option of surgical excision to remove it if it is growing, symptomatic, or other changes noted. Please call for new or changing lesions so they can be evaluated.  Actinic Damage - chronic, secondary to cumulative UV radiation exposure/sun exposure over time - diffuse scaly erythematous macules with underlying dyspigmentation - Recommend daily broad spectrum sunscreen SPF 30+ to sun-exposed areas, reapply every 2 hours as needed.  - Recommend staying in the shade or wearing long sleeves, sun glasses (UVA+UVB protection) and wide brim hats (4-inch brim around the entire circumference of the hat). - Call for new or changing lesions.  Return for  surgery.  Luther Redo, CMA, am acting as scribe for Sarina Ser, MD . Documentation: I have reviewed the above documentation for accuracy and completeness, and I agree with the above.  Sarina Ser, MD

## 2021-10-01 NOTE — Patient Instructions (Signed)
 Pre-Operative Instructions  You are scheduled for a surgical procedure at [facility]. We recommend you read the following instructions. If you have any questions or concerns, please call the office at [phone].  Shower and wash the entire body with soap and water the day of your surgery paying special attention to cleansing at and around the planned surgery site.  Avoid aspirin or aspirin containing products at least fourteen (14) days prior to your surgical procedure and for at least one week (7 Days) after your surgical procedure. If you take aspirin on a regular basis for heart disease or history of stroke or for any other reason, we may recommend you continue taking aspirin but please notify us if you take this on a regular basis. Aspirin can cause more bleeding to occur during surgery as well as prolonged bleeding and bruising after surgery.   Avoid other nonsteroidal pain medications at least one week prior to surgery and at least one week prior to your surgery. These include medications such as Ibuprofen (Motrin, Advil and Nuprin), Naprosyn, Voltaren, Relafen, etc. If medications are used for therapeutic reasons, please inform us as they can cause increased bleeding or prolonged bleeding during and bruising after surgical procedures.   Please advise us if you are taking any "blood thinner" medications such as Coumadin or Dipyridamole or Plavix or similar medications. These cause increased bleeding and prolonged bleeding during procedures and bruising after surgical procedures. We may have to consider discontinuing these medications briefly prior to and shortly after your surgery if safe to do so.   Please inform us of all medications you are currently taking. All medications that are taken regularly should be taken the day of surgery as you always do. Nevertheless, we need to be informed of what medications you are taking prior to surgery to know whether they will affect the  procedure or cause any complications.   Please inform us of any medication allergies. Also inform us of whether you have allergies to Latex or rubber products or whether you have had any adverse reaction to Lidocaine or Epinephrine.  Please inform us of any prosthetic or artificial body parts such as artificial heart valve, joint replacements, etc., or similar condition that might require preoperative antibiotics.   We recommend avoidance of alcohol at least two weeks prior to surgery and continued avoidance for at least two weeks after surgery.   We recommend discontinuation of tobacco smoking at least two weeks prior to surgery and continued abstinence for at least two weeks after surgery.  Do not plan strenuous exercise, strenuous work or strenuous lifting for approximately four weeks after your surgery.   We request if you are unable to make your scheduled surgical appointment, please call us at least a week in advance or as soon as you are aware of a problem so that we can cancel or reschedule the appointment.   You MAY TAKE TYLENOL (acetaminophen) for pain as it is not a blood thinner.   PLEASE PLAN TO BE IN TOWN FOR TWO WEEKS FOLLOWING SURGERY, THIS IS IMPORTANT SO YOU CAN BE CHECKED FOR DRESSING CHANGES, SUTURE REMOVAL AND TO MONITOR FOR POSSIBLE COMPLICATIONS.    Due to recent changes in healthcare laws, you may see results of your pathology and/or laboratory studies on MyChart before the doctors have had a chance to review them. We understand that in some cases there may be results that are confusing or concerning to you. Please understand that not all results are   received at the same time and often the doctors may need to interpret multiple results in order to provide you with the best plan of care or course of treatment. Therefore, we ask that you please give us 2 business days to thoroughly review all your results before contacting the office for clarification. Should we see a  critical lab result, you will be contacted sooner.   If You Need Anything After Your Visit  If you have any questions or concerns for your doctor, please call our main line at [phone] and press option 4 to reach your doctor's medical assistant. If no one answers, please leave a voicemail as directed and we will return your call as soon as possible. Messages left after 4 pm will be answered the following business day.   You may also send us a message via MyChart. We typically respond to MyChart messages within 1-2 business days.  For prescription refills, please ask your pharmacy to contact our office. Our fax number is [phone].  If you have an urgent issue when the clinic is closed that cannot wait until the next business day, you can page your doctor at the number below.    Please note that while we do our best to be available for urgent issues outside of office hours, we are not available 24/7.   If you have an urgent issue and are unable to reach us, you may choose to seek medical care at your doctor's office, retail clinic, urgent care center, or emergency room.  If you have a medical emergency, please immediately call 911 or go to the emergency department.  Pager Numbers  - Dr. Kowalski: [phone]  - Dr. Moye: [phone]  - Dr. Stewart: [phone]  In the event of inclement weather, please call our main line at [phone] for an update on the status of any delays or closures.  Dermatology Medication Tips: Please keep the boxes that topical medications come in in order to help keep track of the instructions about where and how to use these. Pharmacies typically print the medication instructions only on the boxes and not directly on the medication tubes.   If your medication is too expensive, please contact our office at [phone] option 4 or send us a message through MyChart.   We are unable to tell what your co-pay for medications will be in advance as  this is different depending on your insurance coverage. However, we may be able to find a substitute medication at lower cost or fill out paperwork to get insurance to cover a needed medication.   If a prior authorization is required to get your medication covered by your insurance company, please allow us 1-2 business days to complete this process.  Drug prices often vary depending on where the prescription is filled and some pharmacies may offer cheaper prices.  The website www.goodrx.com contains coupons for medications through different pharmacies. The prices here do not account for what the cost may be with help from insurance (it may be cheaper with your insurance), but the website can give you the price if you did not use any insurance.  - You can print the associated coupon and take it with your prescription to the pharmacy.  - You may also stop by our office during regular business hours and pick up a GoodRx coupon card.  - If you need your prescription sent electronically to a different pharmacy, notify our office through [facility] [place] MyChart or by phone at [phone] option 4.       Si Usted Necesita Algo Despus de Su Visita  Tambin puede enviarnos un mensaje a travs de MyChart. Por lo general respondemos a los mensajes de MyChart en el transcurso de 1 a 2 das hbiles.  Para renovar recetas, por favor pida a su farmacia que se ponga en contacto con nuestra oficina. Nuestro nmero de fax es el [phone].  Si tiene un asunto urgente cuando la clnica est cerrada y que no puede esperar hasta el siguiente da hbil, puede llamar/localizar a su doctor(a) al nmero que aparece a continuacin.   Por favor, tenga en cuenta que aunque hacemos todo lo posible para estar disponibles para asuntos urgentes fuera del horario de oficina, no estamos disponibles las 24 horas del da, los 7 das de la semana.   Si tiene un problema urgente y no puede comunicarse con nosotros, puede optar por  buscar atencin mdica  en el consultorio de su doctor(a), en una clnica privada, en un centro de atencin urgente o en una sala de emergencias.  Si tiene una emergencia mdica, por favor llame inmediatamente al 911 o vaya a la sala de emergencias.  Nmeros de bper  - Dr. Kowalski: [phone]  - Dra. Moye: [phone]  - Dra. Stewart: [phone]  En caso de inclemencias del tiempo, por favor llame a nuestra lnea principal al [phone] para una actualizacin sobre el estado de cualquier retraso o cierre.  Consejos para la medicacin en dermatologa: Por favor, guarde las cajas en las que vienen los medicamentos de uso tpico para ayudarle a seguir las instrucciones sobre dnde y cmo usarlos. Las farmacias generalmente imprimen las instrucciones del medicamento slo en las cajas y no directamente en los tubos del medicamento.   Si su medicamento es muy caro, por favor, pngase en contacto con nuestra oficina llamando al [phone] y presione la opcin 4 o envenos un mensaje a travs de MyChart.   No podemos decirle cul ser su copago por los medicamentos por adelantado ya que esto es diferente dependiendo de la cobertura de su seguro. Sin embargo, es posible que podamos encontrar un medicamento sustituto a menor costo o llenar un formulario para que el seguro cubra el medicamento que se considera necesario.   Si se requiere una autorizacin previa para que su compaa de seguros cubra su medicamento, por favor permtanos de 1 a 2 das hbiles para completar este proceso.  Los precios de los medicamentos varan con frecuencia dependiendo del lugar de dnde se surte la receta y alguna farmacias pueden ofrecer precios ms baratos.  El sitio web www.goodrx.com tiene cupones para medicamentos de diferentes farmacias. Los precios aqu no tienen en cuenta lo que podra costar con la ayuda del seguro (puede ser ms barato con su seguro), pero el sitio web puede darle el precio si no  utiliz ningn seguro.  - Puede imprimir el cupn correspondiente y llevarlo con su receta a la farmacia.  - Tambin puede pasar por nuestra oficina durante el horario de atencin regular y recoger una tarjeta de cupones de GoodRx.  - Si necesita que su receta se enve electrnicamente a una farmacia diferente, informe a nuestra oficina a travs de MyChart de [facility] o por telfono llamando al [phone] y presione la opcin 4.  

## 2021-10-06 ENCOUNTER — Encounter: Payer: Self-pay | Admitting: Dermatology

## 2021-10-21 ENCOUNTER — Other Ambulatory Visit: Payer: Self-pay | Admitting: Physician Assistant

## 2021-10-21 DIAGNOSIS — E039 Hypothyroidism, unspecified: Secondary | ICD-10-CM

## 2021-10-22 ENCOUNTER — Telehealth: Payer: Self-pay

## 2021-10-22 NOTE — Telephone Encounter (Signed)
Spoke to patients daughter to let her know that the patient needs to  stop taking aspirin 7 days before procedure and she can start back 24 hours after procedure. She verbalized her understanding and had no further questions.

## 2021-10-22 NOTE — Telephone Encounter (Signed)
Patients daughter called office concerning patient having cyst removed October 10th on her left cheek bone, patient will go under light anesthesia, patient has had a few minor strokes, patients daughter was told to consult with provider to see if patient should continue taking aspirin after procedure.

## 2021-10-26 ENCOUNTER — Encounter: Payer: Self-pay | Admitting: Internal Medicine

## 2021-10-26 ENCOUNTER — Ambulatory Visit: Payer: Medicare HMO | Admitting: Internal Medicine

## 2021-10-26 VITALS — BP 128/68 | HR 55 | Temp 97.6°F | Ht 63.0 in | Wt 139.4 lb

## 2021-10-26 DIAGNOSIS — J449 Chronic obstructive pulmonary disease, unspecified: Secondary | ICD-10-CM

## 2021-10-26 DIAGNOSIS — R918 Other nonspecific abnormal finding of lung field: Secondary | ICD-10-CM | POA: Diagnosis not present

## 2021-10-26 DIAGNOSIS — Z7185 Encounter for immunization safety counseling: Secondary | ICD-10-CM

## 2021-10-26 DIAGNOSIS — R0989 Other specified symptoms and signs involving the circulatory and respiratory systems: Secondary | ICD-10-CM | POA: Diagnosis not present

## 2021-10-26 NOTE — Patient Instructions (Addendum)
ICD-10-CM   1. Chronic obstructive pulmonary disease, unspecified COPD type ([place])  J44.9     2. Ground glass opacity present on imaging of lung  R91.8     3. Bibasilar crackles  R09.89     4. Vaccine counseling  Z71.85      Clinically stable  Plan Continue Stiolto Respimat - take two puffs once daily Continue Budeonisde nebulizer morning and evening as directed  Use albuterol every 4-6 hours as needed for breakthrough shortness of breath/wheezing  Continue Allegra and flonase as needed  I will hold off for any CT chest at the moment given stability Deferred flu shot and RSV pending your conversation with primary care physician  Follow-up: 6 months with Dr. Chase Caller or sooner if needed

## 2021-10-26 NOTE — Progress Notes (Signed)
OV 03/20/2020  Subjective:  Patient ID: Donna Arias, female , DOB: 1926-12-31 , age 88 y.o. , MRN: [id number] , ADDRESS: [place] [address] PCP Lorrene Reid, PA-C Patient Care Team: Lorrene Reid, PA-C as PCP - General (Physician Assistant)  This Provider for this visit: Treatment Team:  Attending Provider: Brand Males, MD    03/20/2020 -   Chief Complaint  Patient presents with   Consult    COPD     HPI Donna Arias 88 y.o. -new consultation referred by her primary care nurse practitioner/physician assistant.  Presents with her daughter who gives most of the history.  Patient at baseline is ECOG 4-3.  Is she is mobile between her bed and sofa in the bathroom but only with assist.  She is cognitively intact but physically debilitated because of her age.  She is chronic arthritis.  The daughter tells me that for the last 7 years patient gets recurrent bronchitis 2 times a year in the last 4 to 5 years this is needed antibiotics and prednisone.  Then for a year possibly the year before the pandemic of the year after the onset of the pandemic patient was fine without any respiratory exacerbations but then in the last 3 or 4 months she has had several exacerbations each lasting 6 to 8 weeks and then has recurrence.  They describe cough wheezing and sputum production requiring antibiotics and prednisone.  This is because significant distress to the patient and her daughter and therefore they are here for consultation.  Patient is on Stiolto but she gets exacerbations despite this.  There is a new history of dysphagia and the daughter use the word "asphyxiation" to describe the swallowing difficulties.  This has not been evaluated.  According to the daughter cardiac condition is pretty good.  The daughter is suspicious that the patient might have seasonal allergies.  There is also chronic sinus drainage in the last few months.  Recent chest x-ray just showed  nonspecific changes personally visualized.   CXR -  IMPRESSION: Chronic lung changes without evidence of acute cardiopulmonary disease     Electronically Signed   By: Corrie Mckusick D.O.   On: 01/11/2020 15:46   PFT  No flowsheet data found.    OV 06/04/2020  Subjective:  Patient ID: Donna Arias, female , DOB: [birth year] , age 66 y.o. , MRN: [id number] , ADDRESS: [address] [place] [address] PCP Lorrene Reid, PA-C Patient Care Team: Lorrene Reid, PA-C as PCP - General (Physician Assistant)  This Provider for this visit: Treatment Team:  Attending Provider: Brand Males, MD    06/04/2020 -   Chief Complaint  Patient presents with   Follow-up    Pt has been doing okay since last visit. Has had problems with allergies. Pt also did go see speech and swallow for evaluation.  There has been some improvements with the cough but pt will occ have a spell.     HPI Donna Arias 88 y.o. -returns with her daughter for follow-up and discussion of test results.  In the interim, she has had dysphagia evaluation and she has age-related dysphagia.  The daughter is concerned this.  They are having to modify the diet but she said dysphagia is not fully avoidable.  The coughing and sneezing do continue.  Stiolto helps and nebulizer helps with albuterol.  The patient lives with the daughter.  The daughter prefers nebulizer treatment overall.  CT scan of  the sinus shows some mild chronic sinus stuff in the CT scan of the chest shows upper lobe groundglass opacities.  They deny any mold or mildew or birds or down pillows or down blankets in the house.  The daughter reports patient is on Animal nutritionist.  And for the last 2 days is some increased wheezing.     CT Chest data HRCT 04/07/20   IMPRESSION: 1. Image quality is degraded by respiratory motion and expiratory phase imaging. Upper and midlung zone predominant centrilobular nodularity with areas of subpleural  ground-glass in the lung bases. In the absence of active smoking, respiratory bronchiolitis interstitial lung disease is excluded. Subacute hypersensitivity pneumonitis could have this appearance. Findings are suggestive of an alternative diagnosis (not UIP) per consensus guidelines: Diagnosis of Idiopathic Pulmonary Fibrosis: An Official ATS/ERS/JRS/ALAT Clinical Practice Guideline. [place], Iss 5, [id number], Sep 25 2016. 2. Scattered pulmonary nodules measure 4 mm or less in size. These could be followed in 1 year if the patient is at increased risk for bronchogenic carcinoma, and at the discretion of the referring provider, given the patient's age. 3. Aortic atherosclerosis (ICD10-I70.0). Coronary artery calcification.     Electronically Signed   By: Lorin Picket M.D.   On: 04/07/2020 15:34    No results found.  CT SINUS March 2022  IMPRESSION: 1. Mild ethmoid air cell and inferior maxillary sinus mucosal thickening with frothy secretions in the right sphenoid sinus. No air-fluid levels. 2. Two unerupted teeth in the anterior maxilla.     Electronically Signed   By: Margaretha Sheffield MD   On: 04/08/2020 09:54     04/20/2021 -Interim hx  Patient presents today for regular office follow-up. Breathing wise she is doing well. She has no acute complaints except for having some allergy symptoms currently. She had URI in December 2022 which was treated with Zpack and prednisone. She is compliant with Stiolto respimat two puff daily and Budesonide nebulizer twice daily. She has not needed to use albuterol recently. She is taking Allegrea and flonase. She is at risk for aspiration. She had an choking episode in September 2022 after eating a piece of shrimp, her daughter needed to give her the Heimlich. She practices chin tuck and her daughter watches her while eating at all times.   OV 10/26/2021  Subjective:  Patient ID: Donna Arias, female , DOB:  1926/04/13 , age 41 y.o. , MRN: [id number] , ADDRESS: [place] [address] PCP Lorrene Reid, PA-C Patient Care Team: Lorrene Reid, PA-C as PCP - General (Physician Assistant)  This Provider for this visit: Treatment Team:  Attending Provider: Brand Males, MD    10/26/2021 -   Chief Complaint  Patient presents with   Follow-up    Pt states she has been doing well since last visit and denies any complaints.     HPI Donna Arias 88 y.o. -returns for follow-up.  I personally not seen patient in over a year.  Dr. Michiel Sites is here with her.  Overall she is doing well.  A few weeks ago she had some allergies but did not require MD visit and has recovered.  She is stable taking her inhalers.  She has refills for 8 months.  We discussed flu shot RSV shot but they want to defer this till they see primary care physician.  There are no new complaints.  Last visit was with nurse practitioner in June 2023.  Last  imaging was in March 2022.  We discussed repeat imaging but given recent allergy and stability they want to hold off.  No new issues otherwise.  She takes the assistance of her daughter for her ADLs and during this time the daughter does not observe any undue dyspnea.    CT Chest data  No results found.      has a past medical history of Chronic headaches, COPD (chronic obstructive pulmonary disease) ([place]), Dementia ([place]), Eye infection, left (01/16/2018), GERD (gastroesophageal reflux disease), Heart attack ([place]) (1998), HLD (hyperlipidemia), Hypertension, Skin cancer, Stroke ([place]), and Thyroid disease.   reports that she quit smoking about 28 years ago. Her smoking use included cigarettes. She has never used smokeless tobacco.  Past Surgical History:  Procedure Laterality Date   BREAST LUMPECTOMY Left 1995   [facility]    Allergies  Allergen Reactions   Picato [Ingenol Mebutate] Hives    Codeine Sulfate Other (See Comments)    REACTION: unspecified    Immunization History  Administered Date(s) Administered   Fluad Quad(high Dose 65+) 11/06/2018, 12/11/2019, 11/07/2020   H1N1 02/26/2008   Influenza Split 12/02/2010, 10/07/2011   Influenza Whole 11/17/2006, 10/27/2007, 12/01/2009   Influenza, High Dose Seasonal PF 10/15/2014, 11/10/2015, 10/25/2016, 10/27/2017   Influenza,inj,Quad PF,6+ Mos 11/10/2012, 09/18/2013   PFIZER(Purple Top)SARS-COV-2 Vaccination 01/30/2019, 04/01/2019, 05/01/2019   Pneumococcal Conjugate-13 04/30/2013   Pneumococcal Polysaccharide-23 01/26/2003   Td 01/25/1997, 02/26/2008   Zoster Recombinat (Shingrix) 11/30/2019, 01/31/2020   Zoster, Live 04/08/2008    Family History  Problem Relation Age of Onset   Hypertension Mother    Stroke Mother    Stroke Sister    Alzheimer's disease Sister    Stroke Sister    Hypertension Sister    Stroke Sister    Breast cancer Sister      Current Outpatient Medications:    amLODipine (NORVASC) 2.5 MG tablet, TAKE 1 TABLET (2.5 MG TOTAL) BY MOUTH DAILY., Disp: 90 tablet, Rfl: 1   aspirin 81 MG tablet, Take 81 mg by mouth daily., Disp: , Rfl:    atorvastatin (LIPITOR) 10 MG tablet, TAKE 1 TABLET BY MOUTH DAILY., Disp: 90 tablet, Rfl: 1   benzonatate (TESSALON) 100 MG capsule, Take 1 capsule (100 mg total) by mouth 3 (three) times daily as needed for cough., Disp: 30 capsule, Rfl: 1   budesonide (PULMICORT) 0.25 MG/2ML nebulizer solution, Take 2 mLs (0.25 mg total) by nebulization in the morning and at bedtime., Disp: 120 mL, Rfl: 11   Calcium Carbonate-Vit D-Min (CALCIUM 1200 PO), Take 1 tablet by mouth., Disp: , Rfl:    erythromycin ophthalmic ointment, Place 1 application into the left eye at bedtime., Disp: 3.5 g, Rfl: 2   fexofenadine (ALLEGRA) 180 MG tablet, Take 180 mg by mouth daily., Disp: , Rfl:    FLUoxetine (PROZAC) 10 MG capsule, TAKE 1 CAPSULE BY MOUTH DAILY., Disp: 90 capsule, Rfl: 1    fluticasone (FLONASE) 50 MCG/ACT nasal spray, Place 1 spray into both nostrils 2 (two) times daily., Disp: 16 g, Rfl: 6   furosemide (LASIX) 20 MG tablet, TAKE 1 TABLET BY MOUTH DAILY., Disp: 90 tablet, Rfl: 0   ketoconazole (NIZORAL) 2 % shampoo, APPLY TOPICALLY 2 TIMES A WEEK., Disp: 120 mL, Rfl: 1   levothyroxine (SYNTHROID) 25 MCG tablet, TAKE 1/2 TABLET BY MOUTH DAILY WITH 50 MCG TO EQUAL 62.5 MCG TOTAL DAILY, Disp: 45 tablet, Rfl: 0   levothyroxine (SYNTHROID)  50 MCG tablet, TAKE 1 TABLET BY MOUTH EVERY DAY, Disp: 90 tablet, Rfl: 1   mupirocin ointment (BACTROBAN) 2 %, APPLY TO AFFECTED AREA ON THE SKIN 2 TIMES A DAY, Disp: 22 g, Rfl: 0   nystatin (MYCOSTATIN/NYSTOP) powder, Apply 1 application topically 2 (two) times daily., Disp: 60 g, Rfl: 2   potassium chloride SA (KLOR-CON M) 20 MEQ tablet, TAKE 1 TABLET BY MOUTH ONCE DAILY., Disp: 90 tablet, Rfl: 1   risperiDONE (RISPERDAL) 1 MG tablet, TAKE 1 TABLET BY MOUTH 2 TIMES DAILY., Disp: 180 tablet, Rfl: 1   STIOLTO RESPIMAT 2.5-2.5 MCG/ACT AERS, INHALE 2 PUFFS INTO THE LUNGS DAILY., Disp: 4 g, Rfl: 2   triamcinolone cream (KENALOG) 0.1 %, Apply 1 application topically 2 (two) times daily. Request jar, Disp: 453 g, Rfl: 0   albuterol (PROVENTIL) (2.5 MG/3ML) 0.083% nebulizer solution, Take 3 mLs (2.5 mg total) by nebulization every 6 (six) hours as needed for wheezing or shortness of breath. (Patient not taking: Reported on 10/26/2021), Disp: 75 mL, Rfl: 6  Current Facility-Administered Medications:    ipratropium-albuterol (DUONEB) 0.5-2.5 (3) MG/3ML nebulizer solution 3 mL, 3 mL, Nebulization, Q6H, Danford, Katy D, NP, 3 mL at 12/08/17 0958      Objective:   Vitals:   10/26/21 1446  BP: 128/68  Pulse: (!) 55  Temp: 97.6 F (36.4 C)  TempSrc: Oral  SpO2: 97%  Weight: 139 lb 6.4 oz (63.2 kg)  Height: '5\' 3"'$  (1.6 m)    Estimated body mass index is 24.69 kg/m as calculated from the following:   Height as of this encounter: '5\' 3"'$   (1.6 m).   Weight as of this encounter: 139 lb 6.4 oz (63.2 kg).  '@WEIGHTCHANGE'$ @  [employer]   10/26/21 1446  Weight: 139 lb 6.4 oz (63.2 kg)     Physical Exam General: No distress. Elderlyfrail in wheel chair. Sitting Neuro: Alert and Oriented x 3. GCS 15. Speech normal Psych: Pleasant Resp:  Barrel Chest - no.  Wheeze - no, Crackles - yes, No overt respiratory distress CVS: Normal heart sounds. Murmurs - no Ext: Stigmata of Connective Tissue Disease - no HEENT: Normal upper airway. PEERL +. No post nasal drip        Assessment:       ICD-10-CM   1. Chronic obstructive pulmonary disease, unspecified COPD type ([place])  J44.9     2. Ground glass opacity present on imaging of lung  R91.8     3. Bibasilar crackles  R09.89     4. Vaccine counseling  Z71.85          Plan:     Patient Instructions     ICD-10-CM   1. Chronic obstructive pulmonary disease, unspecified COPD type ([place])  J44.9     2. Ground glass opacity present on imaging of lung  R91.8     3. Bibasilar crackles  R09.89     4. Vaccine counseling  Z71.85      Clinically stable  Plan Continue Stiolto Respimat - take two puffs once daily Continue Budeonisde nebulizer morning and evening as directed  Use albuterol every 4-6 hours as needed for breakthrough shortness of breath/wheezing  Continue Allegra and flonase as needed  I will hold off for any CT chest at the moment given stability Deferred flu shot and RSV pending your conversation with primary care physician  Follow-up: 6 months with Dr. Chase Caller or sooner if needed     SIGNATURE    Dr. Belva Crome  Chase Caller, M.D., F.C.C.P,  Pulmonary and Critical Care Medicine Staff Physician, [facility] Director - Interstitial Lung Disease  Program  Pulmonary [place] [facility] [place], [place], [address]  Pager: [phone], If no answer or between  15:00h - 7:00h: call [phone] Telephone: [phone]  3:23 PM 10/26/2021

## 2021-11-03 ENCOUNTER — Ambulatory Visit (INDEPENDENT_AMBULATORY_CARE_PROVIDER_SITE_OTHER): Payer: Medicare HMO | Admitting: Dermatology

## 2021-11-03 DIAGNOSIS — C44329 Squamous cell carcinoma of skin of other parts of face: Secondary | ICD-10-CM | POA: Diagnosis not present

## 2021-11-03 DIAGNOSIS — D485 Neoplasm of uncertain behavior of skin: Secondary | ICD-10-CM

## 2021-11-03 DIAGNOSIS — C4492 Squamous cell carcinoma of skin, unspecified: Secondary | ICD-10-CM

## 2021-11-03 DIAGNOSIS — C44320 Squamous cell carcinoma of skin of unspecified parts of face: Secondary | ICD-10-CM

## 2021-11-03 HISTORY — DX: Squamous cell carcinoma of skin, unspecified: C44.92

## 2021-11-03 MED ORDER — MUPIROCIN 2 % EX OINT: 1.0000 | TOPICAL_OINTMENT | Freq: Every day | CUTANEOUS | 0 refills | Status: DC

## 2021-11-03 NOTE — Patient Instructions (Signed)

## 2021-11-03 NOTE — Progress Notes (Signed)
   Follow-Up Visit   Subjective  Donna Arias is a 88 y.o. female who presents for the following: Cyst vs other (Left zygoma - excise today).  Accompanied by daughter  The following portions of the chart were reviewed this encounter and updated as appropriate:   Tobacco  Allergies  Meds  Problems  Med Hx  Surg Hx  Fam Hx      Review of Systems:  No other skin or systemic complaints except as noted in HPI or Assessment and Plan.  Objective  Well appearing patient in no apparent distress; mood and affect are within normal limits.  A focused examination was performed including face. Relevant physical exam findings are noted in the Assessment and Plan.  Left zygoma Cystic papule 2.2 cm   Assessment & Plan  Neoplasm of uncertain behavior of skin - growing cyst vs other Left zygoma Skin excision  Lesion length (cm):  2.2 Lesion width (cm):  2.2 Margin per side (cm):  0 Total excision diameter (cm):  2.2 Informed consent: discussed and consent obtained   Timeout: patient name, date of birth, surgical site, and procedure verified   Procedure prep:  Patient was prepped and draped in usual sterile fashion Prep type:  Isopropyl alcohol and povidone-iodine Anesthesia: the lesion was anesthetized in a standard fashion   Anesthetic:  1% lidocaine w/ epinephrine 1-100,000 buffered w/ 8.4% NaHCO3 Instrument used: #15 blade   Hemostasis achieved with: pressure   Hemostasis achieved with comment:  Electrocautery Outcome: patient tolerated procedure well with no complications   Post-procedure details: sterile dressing applied and wound care instructions given   Dressing type: bandage and pressure dressing (mupirocin)    Skin repair Complexity:  Complex Final length (cm):  2.7 Reason for type of repair: reduce tension to allow closure, reduce the risk of dehiscence, infection, and necrosis, reduce subcutaneous dead space and avoid a hematoma, allow closure of the large defect,  preserve normal anatomy, preserve normal anatomical and functional relationships and enhance both functionality and cosmetic results   Undermining: area extensively undermined   Undermining comment:  Undermining defect 2.2 cm Subcutaneous layers (deep stitches):  Suture size:  5-0 Suture type: Vicryl (polyglactin 910)   Subcutaneous suture technique: inverted dermal. Fine/surface layer approximation (top stitches):  Suture size:  5-0 Suture type: nylon   Stitches: simple running   Suture removal (days):  7 Hemostasis achieved with: suture and pressure Outcome: patient tolerated procedure well with no complications   Post-procedure details: sterile dressing applied and wound care instructions given   Dressing type: bandage and pressure dressing (mupirocin)    mupirocin ointment (BACTROBAN) 2 % Apply 1 Application topically daily. With dressing changes  Specimen 1 - Surgical pathology Differential Diagnosis: Cyst vs other  Check Margins: No  Return in about 1 week (around 11/10/2021) for suture removal.  I, Ashok Cordia, CMA, am acting as scribe for Sarina Ser, MD . Documentation: I have reviewed the above documentation for accuracy and completeness, and I agree with the above.  Sarina Ser, MD

## 2021-11-05 ENCOUNTER — Other Ambulatory Visit (HOSPITAL_COMMUNITY): Payer: Self-pay

## 2021-11-06 ENCOUNTER — Telehealth: Payer: Self-pay | Admitting: Internal Medicine

## 2021-11-09 ENCOUNTER — Ambulatory Visit (INDEPENDENT_AMBULATORY_CARE_PROVIDER_SITE_OTHER): Payer: Medicare HMO | Admitting: Dermatology

## 2021-11-09 ENCOUNTER — Other Ambulatory Visit (HOSPITAL_COMMUNITY): Payer: Self-pay

## 2021-11-09 DIAGNOSIS — D492 Neoplasm of unspecified behavior of bone, soft tissue, and skin: Secondary | ICD-10-CM

## 2021-11-09 DIAGNOSIS — Z4802 Encounter for removal of sutures: Secondary | ICD-10-CM

## 2021-11-09 NOTE — Patient Instructions (Signed)
Due to recent changes in healthcare laws, you may see results of your pathology and/or laboratory studies on MyChart before the doctors have had a chance to review them. We understand that in some cases there may be results that are confusing or concerning to you. Please understand that not all results are received at the same time and often the doctors may need to interpret multiple results in order to provide you with the best plan of care or course of treatment. Therefore, we ask that you please give us 2 business days to thoroughly review all your results before contacting the office for clarification. Should we see a critical lab result, you will be contacted sooner.   If You Need Anything After Your Visit  If you have any questions or concerns for your doctor, please call our main line at [phone] and press option 4 to reach your doctor's medical assistant. If no one answers, please leave a voicemail as directed and we will return your call as soon as possible. Messages left after 4 pm will be answered the following business day.   You may also send us a message via MyChart. We typically respond to MyChart messages within 1-2 business days.  For prescription refills, please ask your pharmacy to contact our office. Our fax number is [phone].  If you have an urgent issue when the clinic is closed that cannot wait until the next business day, you can page your doctor at the number below.    Please note that while we do our best to be available for urgent issues outside of office hours, we are not available 24/7.   If you have an urgent issue and are unable to reach us, you may choose to seek medical care at your doctor's office, retail clinic, urgent care center, or emergency room.  If you have a medical emergency, please immediately call 911 or go to the emergency department.  Pager Numbers  - Dr. Kowalski: [phone]  - Dr. Moye: [phone]  - Dr. Stewart:  [phone]  In the event of inclement weather, please call our main line at [phone] for an update on the status of any delays or closures.  Dermatology Medication Tips: Please keep the boxes that topical medications come in in order to help keep track of the instructions about where and how to use these. Pharmacies typically print the medication instructions only on the boxes and not directly on the medication tubes.   If your medication is too expensive, please contact our office at [phone] option 4 or send us a message through MyChart.   We are unable to tell what your co-pay for medications will be in advance as this is different depending on your insurance coverage. However, we may be able to find a substitute medication at lower cost or fill out paperwork to get insurance to cover a needed medication.   If a prior authorization is required to get your medication covered by your insurance company, please allow us 1-2 business days to complete this process.  Drug prices often vary depending on where the prescription is filled and some pharmacies may offer cheaper prices.  The website www.goodrx.com contains coupons for medications through different pharmacies. The prices here do not account for what the cost may be with help from insurance (it may be cheaper with your insurance), but the website can give you the price if you did not use any insurance.  - You can print the associated coupon and take it with   your prescription to the pharmacy.  - You may also stop by our office during regular business hours and pick up a GoodRx coupon card.  - If you need your prescription sent electronically to a different pharmacy, notify our office through [facility] MyChart or by phone at [phone] option 4.     Si Usted Necesita Algo Despus de Su Visita  Tambin puede enviarnos un mensaje a travs de MyChart. Por lo general respondemos a los mensajes de MyChart en el transcurso de 1 a 2  das hbiles.  Para renovar recetas, por favor pida a su farmacia que se ponga en contacto con nuestra oficina. Nuestro nmero de fax es el [phone].  Si tiene un asunto urgente cuando la clnica est cerrada y que no puede esperar hasta el siguiente da hbil, puede llamar/localizar a su doctor(a) al nmero que aparece a continuacin.   Por favor, tenga en cuenta que aunque hacemos todo lo posible para estar disponibles para asuntos urgentes fuera del horario de oficina, no estamos disponibles las 24 horas del da, los 7 das de la semana.   Si tiene un problema urgente y no puede comunicarse con nosotros, puede optar por buscar atencin mdica  en el consultorio de su doctor(a), en una clnica privada, en un centro de atencin urgente o en una sala de emergencias.  Si tiene una emergencia mdica, por favor llame inmediatamente al 911 o vaya a la sala de emergencias.  Nmeros de bper  - Dr. Kowalski: [phone]  - Dra. Moye: [phone]  - Dra. Stewart: [phone]  En caso de inclemencias del tiempo, por favor llame a nuestra lnea principal al [phone] para una actualizacin sobre el estado de cualquier retraso o cierre.  Consejos para la medicacin en dermatologa: Por favor, guarde las cajas en las que vienen los medicamentos de uso tpico para ayudarle a seguir las instrucciones sobre dnde y cmo usarlos. Las farmacias generalmente imprimen las instrucciones del medicamento slo en las cajas y no directamente en los tubos del medicamento.   Si su medicamento es muy caro, por favor, pngase en contacto con nuestra oficina llamando al [phone] y presione la opcin 4 o envenos un mensaje a travs de MyChart.   No podemos decirle cul ser su copago por los medicamentos por adelantado ya que esto es diferente dependiendo de la cobertura de su seguro. Sin embargo, es posible que podamos encontrar un medicamento sustituto a menor costo o llenar un formulario para que el  seguro cubra el medicamento que se considera necesario.   Si se requiere una autorizacin previa para que su compaa de seguros cubra su medicamento, por favor permtanos de 1 a 2 das hbiles para completar este proceso.  Los precios de los medicamentos varan con frecuencia dependiendo del lugar de dnde se surte la receta y alguna farmacias pueden ofrecer precios ms baratos.  El sitio web www.goodrx.com tiene cupones para medicamentos de diferentes farmacias. Los precios aqu no tienen en cuenta lo que podra costar con la ayuda del seguro (puede ser ms barato con su seguro), pero el sitio web puede darle el precio si no utiliz ningn seguro.  - Puede imprimir el cupn correspondiente y llevarlo con su receta a la farmacia.  - Tambin puede pasar por nuestra oficina durante el horario de atencin regular y recoger una tarjeta de cupones de GoodRx.  - Si necesita que su receta se enve electrnicamente a una farmacia diferente, informe a nuestra oficina a travs de MyChart de [facility]   o por telfono llamando al [phone] y presione la opcin 4.  

## 2021-11-09 NOTE — Telephone Encounter (Signed)
We ran a test claim and resulted Budesonide Neb Solution is covered and called pharmacy  to ask to  rerun and they got a paid claim and will be informing pt.

## 2021-11-09 NOTE — Telephone Encounter (Signed)
Spoke with the pt's daughter, Donna Arias  She states that she is unable to get more Budesonide neb solution at [facility]  She believes it may require a PA   Routing to pharm team to see if PA can be done  Thanks!

## 2021-11-09 NOTE — Progress Notes (Signed)
   Follow-Up Visit   Subjective  Donna Arias is a 88 y.o. female who presents for the following: Cyst vs other (L zygoma, 1wk f/u, pt presents for suture removal). Patient accompanied by daughter.  The following portions of the chart were reviewed this encounter and updated as appropriate:   Tobacco  Allergies  Meds  Problems  Med Hx  Surg Hx  Fam Hx     Review of Systems:  No other skin or systemic complaints except as noted in HPI or Assessment and Plan.  Objective  Well appearing patient in no apparent distress; mood and affect are within normal limits.  A focused examination was performed including face. Relevant physical exam findings are noted in the Assessment and Plan.  Head - Anterior (Face) Healing excision site   Assessment & Plan  Neoplasm of skin Head - Anterior (Face)  Cyst vs other, healing excision site  Encounter for Removal of Sutures - Incision site at the Lzygoma is clean, dry and intact - Wound cleansed, sutures removed, wound cleansed and steri strips applied.  - Will call patient's daughter with pathology results - Patient advised to keep steri-strips dry until they fall off. - Scars remodel for a full year. - Once steri-strips fall off, patient can apply over-the-counter silicone scar cream each night to help with scar remodeling if desired. - Patient advised to call with any concerns or if they notice any new or changing lesions.    Return if symptoms worsen or fail to improve.  I, Othelia Pulling, RMA, am acting as scribe for Sarina Ser, MD . Documentation: I have reviewed the above documentation for accuracy and completeness, and I agree with the above.  Sarina Ser, MD

## 2021-11-10 ENCOUNTER — Encounter: Payer: Self-pay | Admitting: Dermatology

## 2021-11-11 ENCOUNTER — Telehealth: Payer: Self-pay

## 2021-11-11 ENCOUNTER — Other Ambulatory Visit: Payer: Self-pay | Admitting: Physician Assistant

## 2021-11-11 DIAGNOSIS — Z760 Encounter for issue of repeat prescription: Secondary | ICD-10-CM

## 2021-11-11 NOTE — Telephone Encounter (Signed)
-----   Message from Ralene Bathe, MD sent at 11/10/2021  5:51 PM EDT ----- Diagnosis Skin (M), left zygoma POORLY DIFFERENTIATED SQUAMOUS CELL CARCINOMA, ULCERATED  Cancer - SCC Poory differentiated High risk for recurrence and spreading (metastasis) Schedule pt for follow up in 1 month for re-check

## 2021-11-11 NOTE — Telephone Encounter (Signed)
Left voicemail on daughter's voicemail to return my call.

## 2021-11-11 NOTE — Telephone Encounter (Signed)
Advised patient's daughter of results and scheduled follow up appointment/hd

## 2021-11-12 ENCOUNTER — Encounter (INDEPENDENT_AMBULATORY_CARE_PROVIDER_SITE_OTHER): Payer: Medicare HMO | Admitting: Ophthalmology

## 2021-11-12 NOTE — Progress Notes (Signed)
[facility] Note  11/16/2021     CHIEF COMPLAINT Patient presents for Retina Follow Up   HISTORY OF PRESENT ILLNESS: Donna Arias is a 88 y.o. female who presents to the clinic today for:   HPI     Retina Follow Up   Patient presents with  Wet AMD.  In right eye.  This started months ago.  Duration of 8 weeks.  Since onset it is stable.  I, the attending physician,  performed the HPI with the patient and updated documentation appropriately.        Comments   Patient feels that the vision is the same since her last injection.       Last edited by Bernarda Caffey, MD on 11/16/2021  4:20 PM.    Pt is a previous pt of Dr. Zadie Rhine who has been getting injections OD since 2021, she was on a 7 week schedule, but was just extended to 9 weeks last time she saw Dr. Zadie Rhine in August, pt has had sx OS, but is not being treated with injections in that eye, pt feels like vision is stable since her last appt  Referring physician: Lorrene Reid, PA-C [place]. [place],  [place] [address]  HISTORICAL INFORMATION:   Selected notes from the MEDICAL RECORD NUMBER Previous Dr. Zadie Rhine pt LEE: 08.16.23 Ocular Hx- ex ARMD OU, CRAO OS PMH-    CURRENT MEDICATIONS: Current Outpatient Medications (Ophthalmic Drugs)  Medication Sig   erythromycin ophthalmic ointment Place 1 application into the left eye at bedtime.   No current facility-administered medications for this visit. (Ophthalmic Drugs)   Current Outpatient Medications (Other)  Medication Sig   albuterol (PROVENTIL) (2.5 MG/3ML) 0.083% nebulizer solution Take 3 mLs (2.5 mg total) by nebulization every 6 (six) hours as needed for wheezing or shortness of breath. (Patient not taking: Reported on 10/26/2021)   amLODipine (NORVASC) 2.5 MG tablet TAKE 1 TABLET (2.5 MG TOTAL) BY MOUTH DAILY.   aspirin 81 MG tablet Take 81 mg by mouth daily.   atorvastatin (LIPITOR) 10 MG tablet TAKE 1 TABLET BY  MOUTH DAILY.   benzonatate (TESSALON) 100 MG capsule Take 1 capsule (100 mg total) by mouth 3 (three) times daily as needed for cough.   budesonide (PULMICORT) 0.25 MG/2ML nebulizer solution Take 2 mLs (0.25 mg total) by nebulization in the morning and at bedtime.   Calcium Carbonate-Vit D-Min (CALCIUM 1200 PO) Take 1 tablet by mouth.   fexofenadine (ALLEGRA) 180 MG tablet Take 180 mg by mouth daily.   FLUoxetine (PROZAC) 10 MG capsule TAKE 1 CAPSULE BY MOUTH DAILY.   fluticasone (FLONASE) 50 MCG/ACT nasal spray Place 1 spray into both nostrils 2 (two) times daily.   furosemide (LASIX) 20 MG tablet TAKE 1 TABLET BY MOUTH DAILY.   ketoconazole (NIZORAL) 2 % shampoo APPLY TOPICALLY 2 TIMES A WEEK.   levothyroxine (SYNTHROID) 25 MCG tablet TAKE 1/2 TABLET BY MOUTH DAILY WITH 50 MCG TO EQUAL 62.5 MCG TOTAL DAILY   levothyroxine (SYNTHROID) 50 MCG tablet TAKE 1 TABLET BY MOUTH EVERY DAY   mupirocin ointment (BACTROBAN) 2 % APPLY TO AFFECTED AREA ON THE SKIN 2 TIMES A DAY   mupirocin ointment (BACTROBAN) 2 % Apply 1 Application topically daily. With dressing changes   nystatin (MYCOSTATIN/NYSTOP) powder Apply 1 application topically 2 (two) times daily.   potassium chloride SA (KLOR-CON M) 20 MEQ tablet TAKE 1 TABLET BY MOUTH ONCE DAILY.   risperiDONE (RISPERDAL)  1 MG tablet TAKE 1 TABLET BY MOUTH 2 TIMES DAILY.   STIOLTO RESPIMAT 2.5-2.5 MCG/ACT AERS INHALE 2 PUFFS INTO THE LUNGS DAILY.   triamcinolone cream (KENALOG) 0.1 % Apply 1 application topically 2 (two) times daily. Request jar   Current Facility-Administered Medications (Other)  Medication Route   ipratropium-albuterol (DUONEB) 0.5-2.5 (3) MG/3ML nebulizer solution 3 mL Nebulization   REVIEW OF SYSTEMS: ROS   Positive for: Musculoskeletal, Cardiovascular, Eyes, Respiratory Last edited by Annie Paras, COT on 11/16/2021  1:20 PM.     ALLERGIES Allergies  Allergen Reactions   Picato [Ingenol Mebutate] Hives   Codeine  Sulfate Other (See Comments)    REACTION: unspecified   PAST MEDICAL HISTORY Past Medical History:  Diagnosis Date   Chronic headaches    COPD (chronic obstructive pulmonary disease) ([place])    Dementia ([place])    Eye infection, left 01/16/2018   GERD (gastroesophageal reflux disease)    Heart attack ([place]) 1998   HLD (hyperlipidemia)    Hypertension    Skin cancer    squamous cell of left face   Squamous cell carcinoma of skin 11/03/2021   L zygoma - poorly differentiated   Stroke ([place])    Thyroid disease    Past Surgical History:  Procedure Laterality Date   BREAST LUMPECTOMY Left 1995   DILATION AND CURETTAGE OF [place] HISTORY Family History  Problem Relation Age of Onset   Hypertension Mother    Stroke Mother    Stroke Sister    Alzheimer's disease Sister    Stroke Sister    Hypertension Sister    Stroke Sister    Breast cancer Sister    SOCIAL HISTORY Social History   Tobacco Use   Smoking status: Former    Types: Cigarettes    Quit date: 1995    Years since quitting: 28.8   Smokeless tobacco: Never  Vaping Use   Vaping Use: Never used  Substance Use Topics   Alcohol use: No   Drug use: No       OPHTHALMIC EXAM:  Base Eye Exam     Visual Acuity (Snellen - Linear)       Right Left   Dist cc 20/40 +2 CF at 3'   Dist ph cc NI NI    Correction: Glasses         Tonometry (Tonopen, 1:23 PM)       Right Left   Pressure 13 16         Pupils       Dark Light Shape React APD   Right 3 3 Round Minimal None   Left 3 3 Round Minimal None         Visual Fields       Left Right    Full Full         Extraocular Movement       Right Left    Full, Ortho Full, Ortho         Neuro/Psych     Oriented x3: Yes   Mood/Affect: Normal         Dilation     Both eyes: 1.0% Mydriacyl, 2.5% Phenylephrine @ 1:20 PM           Slit Lamp and Fundus Exam     External Exam       Right  Left   External Normal Normal  Slit Lamp Exam       Right Left   Lids/Lashes Dermatochalasis - upper lid, Meibomian gland dysfunction Dermatochalasis - upper lid, Meibomian gland dysfunction   Conjunctiva/Sclera White and quiet White and quiet   Cornea arcus, trace PEE, well healed cataract wound arcus, well healed cataract wound, 1-2+ inferior Punctate epithelial erosions   Anterior Chamber deep and clear deep and clear   Iris Round and dilated Round and dilated   Lens PC IOL in good position, trace Posterior capsular opacification PC IOL in good position         Fundus Exam       Right Left   Posterior Vitreous Vitreous syneresis Vitreous syneresis   Disc mild Pallor, Sharp rim, PPA, mild disc heme at 0700 2+Pallor, Sharp rim, 360 PPA   C/D Ratio 0.2 0.6   Macula Flat, Blunted foveal reflex, Drusen, RPE mottling and clumping, central CNV with trace cystic changes / edema, telangiectasias inferior mac, no heme Flat, Blunted foveal reflex, central RPE atrophy, RPE mottling and clumping, diffuse atrophy   Vessels attenuated, Tortuous Severer attenuation, mild tortuosity   Periphery Attached, No heme Attached, scattered CR scars           IMAGING AND PROCEDURES  Imaging and Procedures for 11/16/2021  OCT, Retina - OU - Both Eyes       Right Eye Quality was good. Scan locations included subfoveal. Central Foveal Thickness: 283. Progression has no prior data. Findings include no SRF, abnormal foveal contour, retinal drusen , subretinal hyper-reflective material, epiretinal membrane, intraretinal fluid, pigment epithelial detachment (Exu ARMD with central cystic changes nasal fovea).   Left Eye Quality was good. Scan locations included nasal. Central Foveal Thickness: 206. Progression has no prior data. Findings include no IRF, no SRF, abnormal foveal contour, retinal drusen , subretinal hyper-reflective material, inner retinal atrophy, outer retinal atrophy (Diffuse  retinal atrophy / ischemia, central SRHM / scar with surrounding cystic changes).   Notes *Images captured and stored on drive  Diagnosis / Impression:  OD: Exu ARMD with central cystic changes nasal fovea OS: Diffuse retinal atrophy / ischemia, central SRHM / scar with surrounding cystic changes  Clinical management:  See below  Abbreviations: NFP - Normal foveal profile. CME - cystoid macular edema. PED - pigment epithelial detachment. IRF - intraretinal fluid. SRF - subretinal fluid. EZ - ellipsoid zone. ERM - epiretinal membrane. ORA - outer retinal atrophy. ORT - outer retinal tubulation. SRHM - subretinal hyper-reflective material. IRHM - intraretinal hyper-reflective material      Intravitreal Injection, Pharmacologic Agent - OD - Right Eye       Time Out 11/16/2021. 1:44 PM. Confirmed correct patient, procedure, site, and patient consented.   Anesthesia Topical anesthesia was used. Anesthetic medications included Lidocaine 2%, Proparacaine 0.5%.   Procedure Preparation included 5% betadine to ocular surface, eyelid speculum, Ofloxacin . A (32g) needle was used.   Injection: 1.25 mg Bevacizumab 1.62m/0.05ml   Route: Intravitreal, Site: Right Eye   NDC: 50242-060-01, Lot:: [id number] Expiration date: 12/09/2021   Post-op Post injection exam found visual acuity of at least counting fingers. The patient tolerated the procedure well. There were no complications. The patient received written and verbal post procedure care education. Post injection medications included ocuflox.              ASSESSMENT/PLAN:    ICD-10-CM   1. Exudative age-related macular degeneration of right eye with active choroidal neovascularization (HCC)  H35.3211 OCT, Retina - OU - Both Eyes  Intravitreal Injection, Pharmacologic Agent - OD - Right Eye    Bevacizumab (AVASTIN) SOLN 1.25 mg    2. Exudative age-related macular degeneration of left eye with inactive choroidal neovascularization  ([place])  H35.3222     3. Retinal ischemia  H35.82     4. Essential hypertension  I10     5. Hypertensive retinopathy of both eyes  H35.033     6. Pseudophakia of both eyes  Z96.1      Exudative age related macular degeneration, OD  - previous patient of Dr. Zadie Rhine   - history of IVA OD x16 since November 2021-08.17.23 recorded in Epic  - The incidence pathology and anatomy of wet AMD discussed   - discussed treatment options including observation vs intravitreal agents such as Avastin, Lucentis, Eylea, and Vabysmo.    - Risks of endophthalmitis and vascular occlusive events and atrophic changes discussed with patient  - OCT shows central cystic changes nasal fovea OD at 9+ wks  - recommend IVA OD #17 today, 10.23.23 with follow up back to 7 weeks - pt wishes to be treated with IVA OD - RBA of procedure discussed, questions answered - IVA informed consent obtained and signed, 10.23.23 (OD) - see procedure note  - f/u in 7 wks -- DFE/OCT, possible injection  2,3. Exudative age related macular degeneration with retinal ischemia OS -- inactive  - pt and daughter report hx of CRAO OS - exam shows central retinal atrophy  - OCT shows Diffuse retinal atrophy / ischemia, central SRHM / scar with surrounding cystic changes--trace  - f/u in 7 wks -- DFE/OCT  4,5. Hypertensive retinopathy OU - discussed importance of tight BP control - monitor  6. Pseudophakia OU  - s/p CE/IOL OU  - IOLs in good position, doing well  - monitor      Ophthalmic Meds Ordered this visit:  Meds ordered this encounter  Medications   Bevacizumab (AVASTIN) SOLN 1.25 mg     Return in about 7 weeks (around 01/04/2022) for f/u exu ARMD OD, DFE, OCT.  There are no Patient Instructions on file for this visit.   Explained the diagnoses, plan, and follow up with the patient and they expressed understanding.  Patient expressed understanding of the importance of proper follow up care.   This document  serves as a record of services personally performed by Gardiner Sleeper, MD, PhD. It was created on their behalf by Roselee Nova, COMT. The creation of this record is the provider's dictation and/or activities during the visit.  Electronically signed by: Roselee Nova, COMT 11/16/21 4:25 PM  This document serves as a record of services personally performed by Gardiner Sleeper, MD, PhD. It was created on their behalf by San Jetty. Owens Shark, OA an ophthalmic technician. The creation of this record is the provider's dictation and/or activities during the visit.    Electronically signed by: San Jetty. [place], [place] 10.23.2023 4:25 PM  Gardiner Sleeper, M.D., Ph.D. Diseases & Surgery of the Retina and Vitreous [facility]  I have reviewed the above documentation for accuracy and completeness, and I agree with the above. Gardiner Sleeper, M.D., Ph.D. 11/16/21 4:25 PM  Abbreviations: M myopia (nearsighted); A astigmatism; H hyperopia (farsighted); P presbyopia; Mrx spectacle prescription;  CTL contact lenses; OD right eye; OS left eye; OU both eyes  XT exotropia; ET esotropia; PEK punctate epithelial keratitis; PEE punctate epithelial erosions; DES dry eye syndrome; MGD meibomian gland dysfunction; ATs artificial tears; PFAT's  preservative free artificial tears; [place] nuclear sclerotic cataract; PSC posterior subcapsular cataract; ERM epi-retinal membrane; PVD posterior vitreous detachment; RD retinal detachment; DM diabetes mellitus; DR diabetic retinopathy; NPDR non-proliferative diabetic retinopathy; PDR proliferative diabetic retinopathy; CSME clinically significant macular edema; DME diabetic macular edema; dbh dot blot hemorrhages; CWS cotton wool spot; POAG primary open angle glaucoma; C/D cup-to-disc ratio; HVF humphrey visual field; GVF goldmann visual field; OCT optical coherence tomography; IOP intraocular pressure; BRVO Branch retinal vein occlusion; CRVO central retinal vein occlusion;  CRAO central retinal artery occlusion; BRAO branch retinal artery occlusion; RT retinal tear; SB scleral buckle; PPV pars plana vitrectomy; VH Vitreous hemorrhage; PRP panretinal laser photocoagulation; IVK intravitreal kenalog; VMT vitreomacular traction; MH Macular hole;  NVD neovascularization of the disc; NVE neovascularization elsewhere; AREDS age related eye disease study; ARMD age related macular degeneration; POAG primary open angle glaucoma; EBMD epithelial/anterior basement membrane dystrophy; ACIOL anterior chamber intraocular lens; IOL intraocular lens; PCIOL posterior chamber intraocular lens; Phaco/IOL phacoemulsification with intraocular lens placement; [place] photorefractive keratectomy; LASIK laser assisted in situ keratomileusis; HTN hypertension; DM diabetes mellitus; COPD chronic obstructive pulmonary disease

## 2021-11-16 ENCOUNTER — Encounter (INDEPENDENT_AMBULATORY_CARE_PROVIDER_SITE_OTHER): Payer: Self-pay | Admitting: Ophthalmology

## 2021-11-16 ENCOUNTER — Ambulatory Visit (INDEPENDENT_AMBULATORY_CARE_PROVIDER_SITE_OTHER): Payer: Medicare HMO | Admitting: Ophthalmology

## 2021-11-16 DIAGNOSIS — H3582 Retinal ischemia: Secondary | ICD-10-CM | POA: Diagnosis not present

## 2021-11-16 DIAGNOSIS — H353222 Exudative age-related macular degeneration, left eye, with inactive choroidal neovascularization: Secondary | ICD-10-CM | POA: Diagnosis not present

## 2021-11-16 DIAGNOSIS — Z961 Presence of intraocular lens: Secondary | ICD-10-CM | POA: Diagnosis not present

## 2021-11-16 DIAGNOSIS — H35033 Hypertensive retinopathy, bilateral: Secondary | ICD-10-CM | POA: Diagnosis not present

## 2021-11-16 DIAGNOSIS — I10 Essential (primary) hypertension: Secondary | ICD-10-CM | POA: Diagnosis not present

## 2021-11-16 DIAGNOSIS — H353211 Exudative age-related macular degeneration, right eye, with active choroidal neovascularization: Secondary | ICD-10-CM

## 2021-11-16 MED ORDER — BEVACIZUMAB CHEMO INJECTION 1.25MG/0.05ML SYRINGE FOR KALEIDOSCOPE
1.2500 mg | INTRAVITREAL | Status: AC | PRN
  Administered 2021-11-16: 1.25 mg via INTRAVITREAL

## 2021-11-19 ENCOUNTER — Other Ambulatory Visit: Payer: Self-pay | Admitting: Physician Assistant

## 2021-11-19 ENCOUNTER — Encounter: Payer: Self-pay | Admitting: Dermatology

## 2021-11-19 DIAGNOSIS — J449 Chronic obstructive pulmonary disease, unspecified: Secondary | ICD-10-CM

## 2021-11-19 DIAGNOSIS — Z760 Encounter for issue of repeat prescription: Secondary | ICD-10-CM

## 2021-12-03 ENCOUNTER — Ambulatory Visit: Payer: Medicare HMO | Admitting: Dermatology

## 2021-12-03 DIAGNOSIS — Z85828 Personal history of other malignant neoplasm of skin: Secondary | ICD-10-CM

## 2021-12-03 DIAGNOSIS — L820 Inflamed seborrheic keratosis: Secondary | ICD-10-CM

## 2021-12-03 DIAGNOSIS — L570 Actinic keratosis: Secondary | ICD-10-CM | POA: Diagnosis not present

## 2021-12-03 DIAGNOSIS — L578 Other skin changes due to chronic exposure to nonionizing radiation: Secondary | ICD-10-CM | POA: Diagnosis not present

## 2021-12-03 NOTE — Progress Notes (Signed)
Follow-Up Visit   Subjective  Donna Arias is a 88 y.o. female who presents for the following: Follow-up (6 weeks f/u left zygoma SCC treated and removed 11/03/2021).  Daughter with patient   The following portions of the chart were reviewed this encounter and updated as appropriate:   Tobacco  Allergies  Meds  Problems  Med Hx  Surg Hx  Fam Hx      Review of Systems:  No other skin or systemic complaints except as noted in HPI or Assessment and Plan.  Objective  Well appearing patient in no apparent distress; mood and affect are within normal limits.  A focused examination was performed including face. Relevant physical exam findings are noted in the Assessment and Plan.  left zygoma Well healed scar with no evidence of recurrence, no lymphadenopathy.   face x 16 (16) Erythematous thin papules/macules with gritty scale.   face x 2 (2) Stuck-on, waxy, tan-brown papules --Discussed benign etiology and prognosis.    Assessment & Plan  History of SCC (squamous cell carcinoma) of skin -status post excision left zygoma - No evidence of recurrence today - No lymphadenopathy - Recommend regular full body skin exams - Recommend daily broad spectrum sunscreen SPF 30+ to sun-exposed areas, reapply every 2 hours as needed.  - Call if any new or changing lesions are noted between office visits  AK (actinic keratosis) (16) face x 16 Actinic keratoses are precancerous spots that appear secondary to cumulative UV radiation exposure/sun exposure over time. They are chronic with expected duration over 1 year. A portion of actinic keratoses will progress to squamous cell carcinoma of the skin. It is not possible to reliably predict which spots will progress to skin cancer and so treatment is recommended to prevent development of skin cancer.  Recommend daily broad spectrum sunscreen SPF 30+ to sun-exposed areas, reapply every 2 hours as needed.  Recommend staying in the shade or  wearing long sleeves, sun glasses (UVA+UVB protection) and wide brim hats (4-inch brim around the entire circumference of the hat). Call for new or changing lesions.   Destruction of lesion - face x 16 Complexity: simple   Destruction method: cryotherapy   Informed consent: discussed and consent obtained   Timeout:  patient name, date of birth, surgical site, and procedure verified Lesion destroyed using liquid nitrogen: Yes   Region frozen until ice ball extended beyond lesion: Yes   Outcome: patient tolerated procedure well with no complications   Post-procedure details: wound care instructions given    Inflamed seborrheic keratosis (2) face x 2 Symptomatic, irritating, patient would like treated. Destruction of lesion - face x 2 Complexity: simple   Destruction method: cryotherapy   Informed consent: discussed and consent obtained   Timeout:  patient name, date of birth, surgical site, and procedure verified Lesion destroyed using liquid nitrogen: Yes   Region frozen until ice ball extended beyond lesion: Yes   Outcome: patient tolerated procedure well with no complications   Post-procedure details: wound care instructions given    Actinic Damage - chronic, secondary to cumulative UV radiation exposure/sun exposure over time - diffuse scaly erythematous macules with underlying dyspigmentation - Recommend daily broad spectrum sunscreen SPF 30+ to sun-exposed areas, reapply every 2 hours as needed.  - Recommend staying in the shade or wearing long sleeves, sun glasses (UVA+UVB protection) and wide brim hats (4-inch brim around the entire circumference of the hat). - Call for new or changing lesions.  Return in about 3  months (around 03/05/2022) for Aks .  IMarye Round, CMA, am acting as scribe for Sarina Ser, MD .  Documentation: I have reviewed the above documentation for accuracy and completeness, and I agree with the above.  Sarina Ser, MD

## 2021-12-03 NOTE — Patient Instructions (Signed)
Cryotherapy Aftercare  Wash gently with soap and water everyday.   Apply Vaseline and Band-Aid daily until healed.     Due to recent changes in healthcare laws, you may see results of your pathology and/or laboratory studies on MyChart before the doctors have had a chance to review them. We understand that in some cases there may be results that are confusing or concerning to you. Please understand that not all results are received at the same time and often the doctors may need to interpret multiple results in order to provide you with the best plan of care or course of treatment. Therefore, we ask that you please give us 2 business days to thoroughly review all your results before contacting the office for clarification. Should we see a critical lab result, you will be contacted sooner.   If You Need Anything After Your Visit  If you have any questions or concerns for your doctor, please call our main line at [phone] and press option 4 to reach your doctor's medical assistant. If no one answers, please leave a voicemail as directed and we will return your call as soon as possible. Messages left after 4 pm will be answered the following business day.   You may also send us a message via MyChart. We typically respond to MyChart messages within 1-2 business days.  For prescription refills, please ask your pharmacy to contact our office. Our fax number is [phone].  If you have an urgent issue when the clinic is closed that cannot wait until the next business day, you can page your doctor at the number below.    Please note that while we do our best to be available for urgent issues outside of office hours, we are not available 24/7.   If you have an urgent issue and are unable to reach us, you may choose to seek medical care at your doctor's office, retail clinic, urgent care center, or emergency room.  If you have a medical emergency, please immediately call 911 or go to the  emergency department.  Pager Numbers  - Dr. Kowalski: [phone]  - Dr. Moye: [phone]  - Dr. Stewart: [phone]  In the event of inclement weather, please call our main line at [phone] for an update on the status of any delays or closures.  Dermatology Medication Tips: Please keep the boxes that topical medications come in in order to help keep track of the instructions about where and how to use these. Pharmacies typically print the medication instructions only on the boxes and not directly on the medication tubes.   If your medication is too expensive, please contact our office at [phone] option 4 or send us a message through MyChart.   We are unable to tell what your co-pay for medications will be in advance as this is different depending on your insurance coverage. However, we may be able to find a substitute medication at lower cost or fill out paperwork to get insurance to cover a needed medication.   If a prior authorization is required to get your medication covered by your insurance company, please allow us 1-2 business days to complete this process.  Drug prices often vary depending on where the prescription is filled and some pharmacies may offer cheaper prices.  The website www.goodrx.com contains coupons for medications through different pharmacies. The prices here do not account for what the cost may be with help from insurance (it may be cheaper with your insurance), but the website can   give you the price if you did not use any insurance.  - You can print the associated coupon and take it with your prescription to the pharmacy.  - You may also stop by our office during regular business hours and pick up a GoodRx coupon card.  - If you need your prescription sent electronically to a different pharmacy, notify our office through [facility] MyChart or by phone at [phone] option 4.     Si Usted Necesita Algo Despus de Su Visita  Tambin puede  enviarnos un mensaje a travs de MyChart. Por lo general respondemos a los mensajes de MyChart en el transcurso de 1 a 2 das hbiles.  Para renovar recetas, por favor pida a su farmacia que se ponga en contacto con nuestra oficina. Nuestro nmero de fax es el [phone].  Si tiene un asunto urgente cuando la clnica est cerrada y que no puede esperar hasta el siguiente da hbil, puede llamar/localizar a su doctor(a) al nmero que aparece a continuacin.   Por favor, tenga en cuenta que aunque hacemos todo lo posible para estar disponibles para asuntos urgentes fuera del horario de oficina, no estamos disponibles las 24 horas del da, los 7 das de la semana.   Si tiene un problema urgente y no puede comunicarse con nosotros, puede optar por buscar atencin mdica  en el consultorio de su doctor(a), en una clnica privada, en un centro de atencin urgente o en una sala de emergencias.  Si tiene una emergencia mdica, por favor llame inmediatamente al 911 o vaya a la sala de emergencias.  Nmeros de bper  - Dr. Kowalski: [phone]  - Dra. Moye: [phone]  - Dra. Stewart: [phone]  En caso de inclemencias del tiempo, por favor llame a nuestra lnea principal al [phone] para una actualizacin sobre el estado de cualquier retraso o cierre.  Consejos para la medicacin en dermatologa: Por favor, guarde las cajas en las que vienen los medicamentos de uso tpico para ayudarle a seguir las instrucciones sobre dnde y cmo usarlos. Las farmacias generalmente imprimen las instrucciones del medicamento slo en las cajas y no directamente en los tubos del medicamento.   Si su medicamento es muy caro, por favor, pngase en contacto con nuestra oficina llamando al [phone] y presione la opcin 4 o envenos un mensaje a travs de MyChart.   No podemos decirle cul ser su copago por los medicamentos por adelantado ya que esto es diferente dependiendo de la cobertura de su seguro.  Sin embargo, es posible que podamos encontrar un medicamento sustituto a menor costo o llenar un formulario para que el seguro cubra el medicamento que se considera necesario.   Si se requiere una autorizacin previa para que su compaa de seguros cubra su medicamento, por favor permtanos de 1 a 2 das hbiles para completar este proceso.  Los precios de los medicamentos varan con frecuencia dependiendo del lugar de dnde se surte la receta y alguna farmacias pueden ofrecer precios ms baratos.  El sitio web www.goodrx.com tiene cupones para medicamentos de diferentes farmacias. Los precios aqu no tienen en cuenta lo que podra costar con la ayuda del seguro (puede ser ms barato con su seguro), pero el sitio web puede darle el precio si no utiliz ningn seguro.  - Puede imprimir el cupn correspondiente y llevarlo con su receta a la farmacia.  - Tambin puede pasar por nuestra oficina durante el horario de atencin regular y recoger una tarjeta de cupones de GoodRx.  -   Si necesita que su receta se enve electrnicamente a una farmacia diferente, informe a nuestra oficina a travs de MyChart de  o por telfono llamando al [phone] y presione la opcin 4.  

## 2021-12-07 ENCOUNTER — Encounter: Payer: Self-pay | Admitting: Physician Assistant

## 2021-12-07 ENCOUNTER — Ambulatory Visit (INDEPENDENT_AMBULATORY_CARE_PROVIDER_SITE_OTHER): Payer: Medicare HMO | Admitting: Physician Assistant

## 2021-12-07 VITALS — BP 137/81 | HR 71 | Resp 20 | Ht 64.0 in | Wt 137.0 lb

## 2021-12-07 DIAGNOSIS — Z23 Encounter for immunization: Secondary | ICD-10-CM | POA: Diagnosis not present

## 2021-12-07 DIAGNOSIS — Z0000 Encounter for general adult medical examination without abnormal findings: Secondary | ICD-10-CM | POA: Diagnosis not present

## 2021-12-07 DIAGNOSIS — I10 Essential (primary) hypertension: Secondary | ICD-10-CM

## 2021-12-07 DIAGNOSIS — E785 Hyperlipidemia, unspecified: Secondary | ICD-10-CM | POA: Diagnosis not present

## 2021-12-07 DIAGNOSIS — Z760 Encounter for issue of repeat prescription: Secondary | ICD-10-CM

## 2021-12-07 DIAGNOSIS — R21 Rash and other nonspecific skin eruption: Secondary | ICD-10-CM | POA: Diagnosis not present

## 2021-12-07 DIAGNOSIS — E039 Hypothyroidism, unspecified: Secondary | ICD-10-CM | POA: Diagnosis not present

## 2021-12-07 MED ORDER — AMLODIPINE BESYLATE 2.5 MG PO TABS: 2.5000 mg | ORAL_TABLET | Freq: Every day | ORAL | 1 refills | Status: DC

## 2021-12-07 MED ORDER — NYSTATIN 100000 UNIT/GM EX POWD: 1.0000 | Freq: Two times a day (BID) | CUTANEOUS | 2 refills | Status: AC

## 2021-12-07 MED ORDER — LEVOTHYROXINE SODIUM 25 MCG PO TABS: ORAL_TABLET | ORAL | 0 refills | Status: AC

## 2021-12-07 MED ORDER — POTASSIUM CHLORIDE CRYS ER 20 MEQ PO TBCR: 20.0000 meq | EXTENDED_RELEASE_TABLET | Freq: Every day | ORAL | 1 refills | Status: DC

## 2021-12-07 MED ORDER — ATORVASTATIN CALCIUM 10 MG PO TABS: 10.0000 mg | ORAL_TABLET | Freq: Every day | ORAL | 1 refills | Status: AC

## 2021-12-07 MED ORDER — KETOCONAZOLE 2 % EX SHAM: MEDICATED_SHAMPOO | CUTANEOUS | 1 refills | Status: AC

## 2021-12-07 NOTE — Patient Instructions (Signed)
Preventive Care 65 Years and Older, Female Preventive care refers to lifestyle choices and visits with your health care provider that can promote health and wellness. Preventive care visits are also called wellness exams. What can I expect for my preventive care visit? Counseling Your health care provider may ask you questions about your: Medical history, including: Past medical problems. Family medical history. Pregnancy and menstrual history. History of falls. Current health, including: Memory and ability to understand (cognition). Emotional well-being. Home life and relationship well-being. Sexual activity and sexual health. Lifestyle, including: Alcohol, nicotine or tobacco, and drug use. Access to firearms. Diet, exercise, and sleep habits. Work and work environment. Sunscreen use. Safety issues such as seatbelt and bike helmet use. Physical exam Your health care provider will check your: Height and weight. These may be used to calculate your BMI (body mass index). BMI is a measurement that tells if you are at a healthy weight. Waist circumference. This measures the distance around your waistline. This measurement also tells if you are at a healthy weight and may help predict your risk of certain diseases, such as type 2 diabetes and high blood pressure. Heart rate and blood pressure. Body temperature. Skin for abnormal spots. What immunizations do I need?  Vaccines are usually given at various ages, according to a schedule. Your health care provider will recommend vaccines for you based on your age, medical history, and lifestyle or other factors, such as travel or where you work. What tests do I need? Screening Your health care provider may recommend screening tests for certain conditions. This may include: Lipid and cholesterol levels. Hepatitis C test. Hepatitis B test. HIV (human immunodeficiency virus) test. STI (sexually transmitted infection) testing, if you are at  risk. Lung cancer screening. Colorectal cancer screening. Diabetes screening. This is done by checking your blood sugar (glucose) after you have not eaten for a while (fasting). Mammogram. Talk with your health care provider about how often you should have regular mammograms. BRCA-related cancer screening. This may be done if you have a family history of breast, ovarian, tubal, or peritoneal cancers. Bone density scan. This is done to screen for osteoporosis. Talk with your health care provider about your test results, treatment options, and if necessary, the need for more tests. Follow these instructions at home: Eating and drinking  Eat a diet that includes fresh fruits and vegetables, whole grains, lean protein, and low-fat dairy products. Limit your intake of foods with high amounts of sugar, saturated fats, and salt. Take vitamin and mineral supplements as recommended by your health care provider. Do not drink alcohol if your health care provider tells you not to drink. If you drink alcohol: Limit how much you have to 0-1 drink a day. Know how much alcohol is in your drink. In the [place] one drink equals one 12 oz bottle of beer (355 mL), one 5 oz glass of wine (148 mL), or one 1 oz glass of hard liquor (44 mL). Lifestyle Brush your teeth every morning and night with fluoride toothpaste. Floss one time each day. Exercise for at least 30 minutes 5 or more days each week. Do not use any products that contain nicotine or tobacco. These products include cigarettes, chewing tobacco, and vaping devices, such as e-cigarettes. If you need help quitting, ask your health care provider. Do not use drugs. If you are sexually active, practice safe sex. Use a condom or other form of protection in order to prevent STIs. Take aspirin only as told by   your health care provider. Make sure that you understand how much to take and what form to take. Work with your health care provider to find out whether it  is safe and beneficial for you to take aspirin daily. Ask your health care provider if you need to take a cholesterol-lowering medicine (statin). Find healthy ways to manage stress, such as: Meditation, yoga, or listening to music. Journaling. Talking to a trusted person. Spending time with friends and family. Minimize exposure to UV radiation to reduce your risk of skin cancer. Safety Always wear your seat belt while driving or riding in a vehicle. Do not drive: If you have been drinking alcohol. Do not ride with someone who has been drinking. When you are tired or distracted. While texting. If you have been using any mind-altering substances or drugs. Wear a helmet and other protective equipment during sports activities. If you have firearms in your house, make sure you follow all gun safety procedures. What's next? Visit your health care provider once a year for an annual wellness visit. Ask your health care provider how often you should have your eyes and teeth checked. Stay up to date on all vaccines. This information is not intended to replace advice given to you by your health care provider. Make sure you discuss any questions you have with your health care provider. Document Revised: 07/09/2020 Document Reviewed: 07/09/2020 [employer] Patient Education  2023 [employer]  

## 2021-12-07 NOTE — Progress Notes (Signed)
fluad  Subjective:   Donna Arias is a 88 y.o. female who presents for Medicare Annual (Subsequent) preventive examination.  Review of Systems    General:   No F/C, wt loss Pulm:   No DIB, SOB, pleuritic chest pain, +cough Card:  No CP, palpitations Abd:  No n/v/d or pain Ext:  No inc edema from baseline        Objective:    Today's Vitals   12/07/21 1338  BP: 137/81  Pulse: 71  Resp: 20  SpO2: 97%  Weight: 137 lb (62.1 kg)  Height: '5\' 4"'$  (1.626 m)   Body mass index is 23.52 kg/m.     05/26/2020   11:07 AM 05/03/2016    1:38 PM 04/14/2016   10:09 AM  Advanced Directives  Does Patient Have a Medical Advance Directive? Yes Yes Yes  Type of Paramedic of [place];Living will [place];Living will [place];Living will    Current Medications (verified) Outpatient Encounter Medications as of 12/07/2021  Medication Sig   albuterol (PROVENTIL) (2.5 MG/3ML) 0.083% nebulizer solution Take 3 mLs (2.5 mg total) by nebulization every 6 (six) hours as needed for wheezing or shortness of breath.   aspirin 81 MG tablet Take 81 mg by mouth daily.   benzonatate (TESSALON) 100 MG capsule Take 1 capsule (100 mg total) by mouth 3 (three) times daily as needed for cough.   budesonide (PULMICORT) 0.25 MG/2ML nebulizer solution Take 2 mLs (0.25 mg total) by nebulization in the morning and at bedtime.   Calcium Carbonate-Vit D-Min (CALCIUM 1200 PO) Take 1 tablet by mouth.   erythromycin ophthalmic ointment Place 1 application into the left eye at bedtime.   fexofenadine (ALLEGRA) 180 MG tablet Take 180 mg by mouth daily.   FLUoxetine (PROZAC) 10 MG capsule TAKE 1 CAPSULE BY MOUTH DAILY.   fluticasone (FLONASE) 50 MCG/ACT nasal spray Place 1 spray into both nostrils 2 (two) times daily.   furosemide (LASIX) 20 MG tablet TAKE 1 TABLET BY MOUTH DAILY.   levothyroxine (SYNTHROID) 50 MCG tablet TAKE 1 TABLET BY MOUTH EVERY DAY    mupirocin ointment (BACTROBAN) 2 % APPLY TO AFFECTED AREA ON THE SKIN 2 TIMES A DAY   mupirocin ointment (BACTROBAN) 2 % Apply 1 Application topically daily. With dressing changes   risperiDONE (RISPERDAL) 1 MG tablet TAKE 1 TABLET BY MOUTH 2 TIMES DAILY.   STIOLTO RESPIMAT 2.5-2.5 MCG/ACT AERS INHALE 2 PUFFS INTO THE LUNGS DAILY.   triamcinolone cream (KENALOG) 0.1 % Apply 1 application topically 2 (two) times daily. Request jar   [DISCONTINUED] amLODipine (NORVASC) 2.5 MG tablet TAKE 1 TABLET (2.5 MG TOTAL) BY MOUTH DAILY.   [DISCONTINUED] atorvastatin (LIPITOR) 10 MG tablet TAKE 1 TABLET BY MOUTH DAILY.   [DISCONTINUED] ketoconazole (NIZORAL) 2 % shampoo APPLY TOPICALLY 2 TIMES A WEEK.   [DISCONTINUED] levothyroxine (SYNTHROID) 25 MCG tablet TAKE 1/2 TABLET BY MOUTH DAILY WITH 50 MCG TO EQUAL 62.5 MCG TOTAL DAILY   [DISCONTINUED] nystatin (MYCOSTATIN/NYSTOP) powder Apply 1 application topically 2 (two) times daily.   [DISCONTINUED] potassium chloride SA (KLOR-CON M) 20 MEQ tablet TAKE 1 TABLET BY MOUTH ONCE DAILY.   amLODipine (NORVASC) 2.5 MG tablet Take 1 tablet (2.5 mg total) by mouth daily.   atorvastatin (LIPITOR) 10 MG tablet Take 1 tablet (10 mg total) by mouth daily.   ketoconazole (NIZORAL) 2 % shampoo APPLY TOPICALLY 2 TIMES A WEEK.   levothyroxine (SYNTHROID) 25 MCG tablet TAKE 1/2 TABLET  BY MOUTH DAILY WITH 50 MCG TO EQUAL 62.5 MCG TOTAL DAILY   nystatin (MYCOSTATIN/NYSTOP) powder Apply 1 Application topically 2 (two) times daily.   potassium chloride SA (KLOR-CON M) 20 MEQ tablet Take 1 tablet (20 mEq total) by mouth daily.   Facility-Administered Encounter Medications as of 12/07/2021  Medication   ipratropium-albuterol (DUONEB) 0.5-2.5 (3) MG/3ML nebulizer solution 3 mL    Allergies (verified) Picato [ingenol mebutate] and Codeine sulfate   History: Past Medical History:  Diagnosis Date   Chronic headaches    COPD (chronic obstructive pulmonary disease) (HCC)     Dementia (HCC)    Eye infection, left 01/16/2018   GERD (gastroesophageal reflux disease)    Heart attack ([place]) 1998   HLD (hyperlipidemia)    Hypertension    Skin cancer    squamous cell of left face   Squamous cell carcinoma of skin 11/03/2021   L zygoma - poorly differentiated   Stroke ([place])    Thyroid disease    Past Surgical History:  Procedure Laterality Date   BREAST LUMPECTOMY Left 1995   DILATION AND CURETTAGE OF UTERUS  1975   [place]   Family History  Problem Relation Age of Onset   Hypertension Mother    Stroke Mother    Stroke Sister    Alzheimer's disease Sister    Stroke Sister    Hypertension Sister    Stroke Sister    Breast cancer Sister    Social History   Socioeconomic History   Marital status: Widowed    Spouse name: Not on file   Number of children: 1   Years of education: Not on file   Highest education level: Not on file  Occupational History   Occupation: RETIRED  Tobacco Use   Smoking status: Former    Types: Cigarettes    Quit date: 1995    Years since quitting: 28.8   Smokeless tobacco: Never  Vaping Use   Vaping Use: Never used  Substance and Sexual Activity   Alcohol use: No   Drug use: No   Sexual activity: Never    Birth control/protection: None  Other Topics Concern   Not on file  Social History Narrative   CB x 1   Lives at home with her daughter (since 02/05/2013)   Right handed   Regular exercise - NO   Social Determinants of Health   Financial Resource Strain: Not on file  Food Insecurity: Not on file  Transportation Needs: Not on file  Physical Activity: Not on file  Stress: Not on file  Social Connections: Not on file    Tobacco Counseling Counseling given: Not Answered   Clinical Intake:                 Diabetic?no         Activities of Daily Living    12/07/2021    1:50 PM 09/23/2021   10:29 AM  In your present state of health, do you have any difficulty  performing the following activities:  Hearing? 1 1  Vision? 1 1  Difficulty concentrating or making decisions? 1 0  Walking or climbing stairs? 1 1  Dressing or bathing? 1 1  Doing errands, shopping? 1 1    Patient Care Team: Lorrene Reid, PA-C as PCP - General (Physician Assistant)  Indicate any recent Medical Services you may have received from other than [facility] providers in the past year (date may be approximate).     Assessment:  This is a routine wellness examination for Donna Arias.  Hearing/Vision screen No results found.  Dietary issues and exercise activities discussed:  -Patient's daughter manages daily meals. Good appetite. No regular exercise due to decreased mobility.   Goals Addressed   None   Depression Screen    12/07/2021    1:49 PM 09/23/2021   10:29 AM 08/25/2021    2:50 PM 06/02/2021   10:00 AM 03/26/2021    8:27 AM 01/15/2021    3:59 PM 11/28/2020    9:49 AM  PHQ 2/9 Scores  PHQ - 2 Score 0 0 0 0 0 0 0  PHQ- 9 Score  1 1 0  1 1    Fall Risk    12/07/2021    1:50 PM 09/23/2021   10:28 AM 08/25/2021    2:49 PM 06/02/2021   10:00 AM 03/26/2021    8:26 AM  Fall Risk   Falls in the past year? 1 0 0 0 0  Number falls in past yr: 0 0 0 0 0  Injury with Fall? 0 0 0 0 0  Risk for fall due to :  Impaired balance/gait;Impaired mobility No Fall Risks No Fall Risks Impaired balance/gait;Impaired mobility;Mental status change  Follow up  Falls evaluation completed Falls evaluation completed Falls evaluation completed Falls evaluation completed    FALL RISK PREVENTION PERTAINING TO THE HOME:  Any stairs in or around the home? Yes  If so, are there any without handrails? Yes  Home free of loose throw rugs in walkways, pet beds, electrical cords, etc? No  Adequate lighting in your home to reduce risk of falls? Yes   ASSISTIVE DEVICES UTILIZED TO PREVENT FALLS:  Life alert? No  Use of a cane, walker or w/c? Yes  Grab bars in the bathroom? Yes  Shower chair or  bench in shower? Yes  Elevated toilet seat or a handicapped toilet? Yes   TIMED UP AND GO:  Was the test performed? Yes .  Length of time to ambulate 10 feet: 10-15 sec.   Gait slow and steady without use of assistive device  Cognitive Function:        12/07/2021    1:34 PM 11/28/2020    9:52 AM 08/23/2016   10:45 AM  6CIT Screen  What Year? 4 points 0 points 0 points  What month? 0 points 0 points 0 points  What time? 3 points 3 points 0 points  Count back from 20 4 points 2 points 4 points  Months in reverse 4 points 2 points 4 points  Repeat phrase 10 points 4 points 10 points  Total Score 25 points 11 points 18 points    Immunizations Immunization History  Administered Date(s) Administered   Fluad Quad(high Dose 65+) 11/06/2018, 12/11/2019, 11/07/2020, 12/07/2021   H1N1 02/26/2008   Influenza Split 12/02/2010, 10/07/2011   Influenza Whole 11/17/2006, 10/27/2007, 12/01/2009   Influenza, High Dose Seasonal PF 10/15/2014, 11/10/2015, 10/25/2016, 10/27/2017   Influenza,inj,Quad PF,6+ Mos 11/10/2012, 09/18/2013   PFIZER(Purple Top)SARS-COV-2 Vaccination 01/30/2019, 04/01/2019, 05/01/2019   Pneumococcal Conjugate-13 04/30/2013   Pneumococcal Polysaccharide-23 01/26/2003   Td 01/25/1997, 02/26/2008   Zoster Recombinat (Shingrix) 11/30/2019, 01/31/2020   Zoster, Live 04/08/2008    TDAP status: Due, Education has been provided regarding the importance of this vaccine. Advised may receive this vaccine at local pharmacy or Health Dept. Aware to provide a copy of the vaccination record if obtained from local pharmacy or Health Dept. Verbalized acceptance and understanding.  Flu Vaccine status:  Completed at today's visit  Pneumococcal vaccine status: Up to date  Covid-19 vaccine status: Completed vaccines  Qualifies for Shingles Vaccine? Yes   Zostavax completed No   Shingrix Completed?: Yes  Screening Tests Health Maintenance  Topic Date Due   TETANUS/TDAP   02/25/2018   COVID-19 Vaccine (4 - Pfizer risk series) 06/26/2019   Medicare Annual Wellness (AWV)  12/08/2022   Pneumonia Vaccine 63+ Years old  Completed   INFLUENZA VACCINE  Completed   DEXA SCAN  Completed   Zoster Vaccines- Shingrix  Completed   HPV VACCINES  Aged Out    Health Maintenance  Health Maintenance Due  Topic Date Due   TETANUS/TDAP  02/25/2018   COVID-19 Vaccine (4 - Pfizer risk series) 06/26/2019    Colorectal cancer screening: No longer required.   Mammogram status: No longer required due to age.  Bone Density status: Ordered pt deferred. Pt provided with contact info and advised to call to schedule appt.  Lung Cancer Screening: (Low Dose CT Chest recommended if Age 59-80 years, 30 pack-year currently smoking OR have quit w/in 15years.) does not qualify.   Lung Cancer Screening Referral: n/a  Additional Screening:  Hepatitis C Screening: does qualify; Completed deferred  Vision Screening: Recommended annual ophthalmology exams for early detection of glaucoma and other disorders of the eye. Is the patient up to date with their annual eye exam?  Yes  Who is the provider or what is the name of the office in which the patient attends annual eye exams? Dr. Coralyn Pear If pt is not established with a provider, would they like to be referred to a provider to establish care? No .   Dental Screening: Recommended annual dental exams for proper oral hygiene  Community Resource Referral / Chronic Care Management: CRR required this visit?  No   CCM required this visit?  No      Plan:  -Recommend to schedule lab visit for routine fasting labs. -Continue to follow-up with various specialists. -Recommend to monitor for worsening cognitive decline. -Continue current medication regimen. -Discussed to continue with supportive care for cough which is likely viral. Follow-up if symptoms fail to improve or worsen. -Follow-up in 6 months for reg OV- HTN, thyroid, CKD   I  have personally reviewed and noted the following in the patient's chart:   Medical and social history Use of alcohol, tobacco or illicit drugs  Current medications and supplements including opioid prescriptions. Patient is not currently taking opioid prescriptions. Functional ability and status Nutritional status Physical activity Advanced directives List of other physicians Hospitalizations, surgeries, and ER visits in previous 12 months Vitals Screenings to include cognitive, depression, and falls Referrals and appointments  In addition, I have reviewed and discussed with patient certain preventive protocols, quality metrics, and best practice recommendations. A written personalized care plan for preventive services as well as general preventive health recommendations were provided to patient.     Lorrene Reid, PA-C   12/07/2021   Nurse Notes: Face to face 20 min.

## 2021-12-12 ENCOUNTER — Encounter: Payer: Self-pay | Admitting: Dermatology

## 2021-12-24 ENCOUNTER — Telehealth: Payer: Self-pay

## 2021-12-24 NOTE — Telephone Encounter (Signed)
Pt daughter is called to see about picking up a urine cup since noticing dark urine on the bed pad.  Heather agreed to allow her to pick up cup, wipes and bag.

## 2021-12-25 ENCOUNTER — Other Ambulatory Visit (INDEPENDENT_AMBULATORY_CARE_PROVIDER_SITE_OTHER): Payer: Medicare HMO

## 2021-12-25 ENCOUNTER — Ambulatory Visit
Admission: RE | Admit: 2021-12-25 | Discharge: 2021-12-25 | Disposition: A | Discharge status: Home / Self Care | Payer: Medicare HMO | Source: Ambulatory Visit | Attending: Emergency Medicine | Admitting: Emergency Medicine

## 2021-12-25 VITALS — BP 143/84 | HR 89 | Temp 97.4°F | Resp 18

## 2021-12-25 DIAGNOSIS — J441 Chronic obstructive pulmonary disease with (acute) exacerbation: Secondary | ICD-10-CM

## 2021-12-25 DIAGNOSIS — R300 Dysuria: Secondary | ICD-10-CM | POA: Diagnosis not present

## 2021-12-25 LAB — POCT URINALYSIS DIP (CLINITEK)
Bilirubin, UA: NEGATIVE
Blood, UA: NEGATIVE
Glucose, UA: NEGATIVE mg/dL
Ketones, POC UA: NEGATIVE mg/dL
Leukocytes, UA: NEGATIVE
Nitrite, UA: NEGATIVE
POC PROTEIN,UA: NEGATIVE
Spec Grav, UA: 1.01 (ref 1.010–1.025)
Urobilinogen, UA: 0.2 E.U./dL
pH, UA: 6 (ref 5.0–8.0)

## 2021-12-25 MED ORDER — METHYLPREDNISOLONE 4 MG PO TBPK: ORAL_TABLET | ORAL | 0 refills | Status: DC

## 2021-12-25 MED ORDER — AEROCHAMBER PLUS FLO-VU MISC: 0 refills | Status: AC

## 2021-12-25 MED ORDER — IPRATROPIUM-ALBUTEROL 0.5-2.5 (3) MG/3ML IN SOLN: 3.0000 mL | RESPIRATORY_TRACT | 1 refills | Status: AC | PRN

## 2021-12-25 MED ORDER — IPRATROPIUM-ALBUTEROL 0.5-2.5 (3) MG/3ML IN SOLN
3.0000 mL | Freq: Once | RESPIRATORY_TRACT | Status: AC
  Administered 2021-12-25: 3 mL via RESPIRATORY_TRACT

## 2021-12-25 NOTE — ED Triage Notes (Signed)
Pt reports nasal congestion, a productive cough, and wheezing. Has been sick for 2 and a half weeks with symptoms worsening then past week. Reports mucous sometimes chokes her. Has been taking Mucinex Hx of COPD

## 2021-12-25 NOTE — Discharge Instructions (Signed)
Please read below to learn more about the medications, dosages and frequencies that I recommend to help alleviate your symptoms and to get you feeling better soon:   Medrol Dosepak (methylprednisolone): This is a steroid that will significantly calm your upper and lower airways, please take one row of tablets daily with your breakfast meal starting tomorrow morning until the prescription is complete.      Combivent, DuoNeb (ipratropium and albuterol): This inhaled medication contains a short acting beta agonist bronchodilator and a muscarinic bronchodilator.  This medication works on the smooth muscle that opens and constricts of your airways by relaxing the muscle.  The result of relaxation of the smooth muscle is increased air movement and improved work of breathing.  This is a short acting medication that can be used every 4-6 hours as needed for increased work of breathing, shortness of breath, wheezing and excessive coughing.    Please use DuoNebs in addition to her regular dose of 2 puffs of Stiolto and instead of albuterol neb treatments.  Please also add her budesonide to these treatments morning and evening until she is feeling significantly better or instructed to do otherwise by your pulmonologist.  Please follow-up with your pulmonologist for repeat evaluation you of your lungs in the next 3 to 4 days to ensure that you are improving and to discuss whether or not your treatment regimen needs to be adjusted.        Thank you for visiting urgent care today.  We appreciate the opportunity to participate in your care.

## 2021-12-25 NOTE — ED Provider Notes (Signed)
[facility]    CSN: [id number] Arrival date & time: 12/25/21  1452    HISTORY   Chief Complaint  Patient presents with   Nasal Congestion   Cough   Wheezing   HPI Donna Arias is a pleasant, 88 y.o. female who presents to urgent care today. Pt suffers from dementia, daughter provides history during visit today.  Reports nasal congestion, a productive cough, and wheezing. Has been sick for 2 and a half weeks with symptoms worsening then past week. Reports mucous sometimes chokes her. Has been taking Mucinex Hx of COPD on budesonide nebs and Stiolto which she is not inhaling well due to lack of coordination and inability to follow instructions.  The history is provided by the patient.   Past Medical History:  Diagnosis Date   Chronic headaches    COPD (chronic obstructive pulmonary disease) ([place])    Dementia ([place])    Eye infection, left 01/16/2018   GERD (gastroesophageal reflux disease)    Heart attack ([place]) 1998   HLD (hyperlipidemia)    Hypertension    Skin cancer    squamous cell of left face   Squamous cell carcinoma of skin 11/03/2021   L zygoma - poorly differentiated   Stroke [facility])    Thyroid disease    Patient Active Problem List   Diagnosis Date Noted   Stage 3b chronic kidney disease ([place]) 06/02/2021   Allergic rhinitis 04/20/2021   Dysphagia 04/30/2020   Impacted cerumen of right ear 04/18/2020   Exudative age-related macular degeneration of right eye with active choroidal neovascularization ([place]) 12/03/2019   Exudative age-related macular degeneration of left eye with inactive choroidal neovascularization ([place]) 06/04/2019   Posterior vitreous detachment of right eye 06/04/2019   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   Advanced nonexudative age-related macular degeneration of left eye with subfoveal involvement 06/04/2019   Intermediate stage nonexudative age-related macular degeneration of right eye 06/04/2019   Excessive cerumen in  ear canal, bilateral 11/06/2018   Ear fullness, left 01/16/2018   Multiple atypical nevi 06/23/2017   Ankle edema, bilateral 05/02/2017   Postnasal drip 12/13/2016   Orthostatic hypotension 11/22/2016   Hyperlipidemia 11/22/2016   Anxiety 11/22/2016   Dysarthria 11/22/2016   Weight gain 11/18/2016   Edema of right foot 11/18/2016   Age-related osteoporosis without current pathological fracture 11/16/2016   Cough 11/16/2016   Healthcare maintenance 10/14/2016   Diaper dermatitis 06/10/2016   Antibiotic-induced yeast infection 06/10/2016   Hearing loss secondary to cerumen impaction, bilateral 06/10/2016   Chronic bilateral low back pain 04/19/2016   Chronic neck pain 04/19/2016   Medication refill 04/14/2016   Joint laxity of right knee 04/14/2016   Hypothyroidism 02/04/2016   Upper back pain 08/01/2009   Transient cerebral ischemia 09/22/2007   Dementia with behavioral disturbance ([place]) 10/07/2006   Essential hypertension 10/07/2006   COPD (chronic obstructive pulmonary disease) ([place]) 10/07/2006   GERD 10/07/2006   Past Surgical History:  Procedure Laterality Date   BREAST LUMPECTOMY Left [place]   OB History   No obstetric history on file.    Home Medications    Prior to Admission medications   Medication Sig Start Date End Date Taking? Authorizing Provider  albuterol (PROVENTIL) (2.5 MG/3ML) 0.083% nebulizer solution Take 3 mLs (2.5 mg total) by nebulization every 6 (six) hours as needed for wheezing or shortness of breath. 11/12/19   Lorrene Reid, PA-C  amLODipine (NORVASC) 2.5 MG tablet Take 1 tablet (2.5 mg total) by mouth daily. 12/07/21   Lorrene Reid, PA-C  aspirin 81 MG tablet Take 81 mg by mouth daily.    [provider]  atorvastatin (LIPITOR) 10 MG tablet Take 1 tablet (10 mg total) by mouth daily. 12/07/21   Lorrene Reid, PA-C  benzonatate (TESSALON) 100 MG capsule Take 1  capsule (100 mg total) by mouth 3 (three) times daily as needed for cough. 03/24/21   Abonza, Maritza, PA-C  budesonide (PULMICORT) 0.25 MG/2ML nebulizer solution Take 2 mLs (0.25 mg total) by nebulization in the morning and at bedtime. 04/20/21   Martyn Ehrich, NP  Calcium Carbonate-Vit D-Min (CALCIUM 1200 PO) Take 1 tablet by mouth.    [provider]  erythromycin ophthalmic ointment Place 1 application into the left eye at bedtime. 08/05/20   Lorrene Reid, PA-C  fexofenadine (ALLEGRA) 180 MG tablet Take 180 mg by mouth daily.    [provider]  FLUoxetine (PROZAC) 10 MG capsule TAKE 1 CAPSULE BY MOUTH DAILY. 08/18/21   Abonza, Maritza, PA-C  fluticasone (FLONASE) 50 MCG/ACT nasal spray Place 1 spray into both nostrils 2 (two) times daily. 10/12/18   Danford, Valetta Fuller D, NP  furosemide (LASIX) 20 MG tablet TAKE 1 TABLET BY MOUTH DAILY. 11/19/21   Lorrene Reid, PA-C  ketoconazole (NIZORAL) 2 % shampoo APPLY TOPICALLY 2 TIMES A WEEK. 12/07/21   Lorrene Reid, PA-C  levothyroxine (SYNTHROID) 25 MCG tablet TAKE 1/2 TABLET BY MOUTH DAILY WITH 50 MCG TO EQUAL 62.5 MCG TOTAL DAILY 12/07/21   Lorrene Reid, PA-C  levothyroxine (SYNTHROID) 50 MCG tablet TAKE 1 TABLET BY MOUTH EVERY DAY 10/21/21   Lorrene Reid, PA-C  mupirocin ointment (BACTROBAN) 2 % APPLY TO AFFECTED AREA ON THE SKIN 2 TIMES A DAY 08/20/21   Abonza, Maritza, PA-C  mupirocin ointment (BACTROBAN) 2 % Apply 1 Application topically daily. With dressing changes 11/03/21   Ralene Bathe, MD  nystatin (MYCOSTATIN/NYSTOP) powder Apply 1 Application topically 2 (two) times daily. 12/07/21   Lorrene Reid, PA-C  potassium chloride SA (KLOR-CON M) 20 MEQ tablet Take 1 tablet (20 mEq total) by mouth daily. 12/07/21   Abonza, Herb Grays, PA-C  risperiDONE (RISPERDAL) 1 MG tablet TAKE 1 TABLET BY MOUTH 2 TIMES DAILY. 11/11/21   Abonza, Maritza, PA-C  STIOLTO RESPIMAT 2.5-2.5 MCG/ACT AERS INHALE 2 PUFFS INTO THE LUNGS DAILY.  11/19/21   Lorrene Reid, PA-C  triamcinolone cream (KENALOG) 0.1 % Apply 1 application topically 2 (two) times daily. Request jar 10/12/18   Mina Marble D, NP    Family History Family History  Problem Relation Age of Onset   Hypertension Mother    Stroke Mother    Stroke Sister    Alzheimer's disease Sister    Stroke Sister    Hypertension Sister    Stroke Sister    Breast cancer Sister    Social History Social History   Tobacco Use   Smoking status: Former    Types: Cigarettes    Quit date: 1995    Years since quitting: 28.9   Smokeless tobacco: Never  Vaping Use   Vaping Use: Never used  Substance Use Topics   Alcohol use: No   Drug use: No   Allergies   Picato [ingenol mebutate] and Codeine sulfate  Review of Systems Review of Systems Pertinent findings revealed after performing a 14 point review of systems has been noted in the history of present illness.  Physical Exam  Triage Vital Signs ED Triage Vitals  Enc Vitals Group     BP 11/21/20 0827 (!) 147/82     Pulse Rate 11/21/20 0827 72     Resp 11/21/20 0827 18     Temp 11/21/20 0827 98.3 F (36.8 C)     Temp Source 11/21/20 0827 Oral     SpO2 11/21/20 0827 98 %     Weight --      Height --      Head Circumference --      Peak Flow --      Pain Score 11/21/20 0826 5     Pain Loc --      Pain Edu? --      Excl. in [place]? --   No data found.  Updated Vital Signs BP (!) 143/84 (BP Location: Right Arm)   Pulse 89   Temp (!) 97.4 F (36.3 C) (Oral)   Resp 18   SpO2 96%   Physical Exam  Visual Acuity Right Eye Distance:   Left Eye Distance:   Bilateral Distance:    Right Eye Near:   Left Eye Near:    Bilateral Near:     UC Couse / Diagnostics / Procedures:     Radiology No results found.  Procedures Procedures (including critical care time) EKG  Pending results:  Labs Reviewed - No data to display  Medications Ordered in UC: Medications  ipratropium-albuterol (DUONEB) 0.5-2.5  (3) MG/3ML nebulizer solution 3 mL (3 mLs Nebulization Given 12/25/21 1648)    UC Diagnoses / Final Clinical Impressions(s)   I have reviewed the triage vital signs and the nursing notes.  Pertinent labs & imaging results that were available during my care of the patient were reviewed by me and considered in my medical decision making (see chart for details).    Final diagnoses:  Acute exacerbation of chronic obstructive pulmonary disease (COPD) ([place])   ***  ED Prescriptions     Medication Sig Dispense Auth. Provider   Spacer/Aero-Holding Chambers (AEROCHAMBER PLUS WITH MASK) inhaler Use with Stiolto inhalations. 1 each Lynden Oxford Scales, PA-C   ipratropium-albuterol (DUONEB) 0.5-2.5 (3) MG/3ML SOLN Take 3 mLs by nebulization every 4 (four) hours as needed. 360 mL Lynden Oxford Scales, PA-C   methylPREDNISolone (MEDROL DOSEPAK) 4 MG TBPK tablet Take 24 mg on day 1, 20 mg on day 2, 16 mg on day 3, 12 mg on day 4, 8 mg on day 5, 4 mg on day 6.  Take all tablets in each row at once, do not spread tablets out throughout the day. 21 tablet Lynden Oxford Scales, PA-C      PDMP not reviewed this encounter.  Disposition Upon Discharge:  Condition: stable for discharge home Home: take medications as prescribed; routine discharge instructions as discussed; follow up as advised.  Patient presented with an acute illness with associated systemic symptoms and significant discomfort requiring urgent management. In my opinion, this is a condition that a prudent lay person (someone who possesses an average knowledge of health and medicine) may potentially expect to result in complications if not addressed urgently such as respiratory distress, impairment of bodily function or dysfunction of bodily organs.   Routine symptom specific, illness specific and/or disease specific instructions were discussed with the patient and/or caregiver at length.   As such, the patient has been evaluated and  assessed, work-up was performed and treatment was provided in alignment with urgent care protocols and evidence based medicine.  Patient/parent/caregiver has been advised  that the patient may require follow up for further testing and treatment if the symptoms continue in spite of treatment, as clinically indicated and appropriate.  If the patient was tested for COVID-19, Influenza and/or RSV, then the patient/parent/guardian was advised to isolate at home pending the results of his/her diagnostic coronavirus test and potentially longer if they're positive. I have also advised pt that if his/her COVID-19 test returns positive, it's recommended to self-isolate for at least 10 days after symptoms first appeared AND until fever-free for 24 hours without fever reducer AND other symptoms have improved or resolved. Discussed self-isolation recommendations as well as instructions for household member/close contacts as per the [facility] [place] DHHS, and also gave patient the [place] packet with this information.  Patient/parent/caregiver has been advised to return to the [facility] or PCP in 3-5 days if no better; to PCP or the Emergency Department if new signs and symptoms develop, or if the current signs or symptoms continue to change or worsen for further workup, evaluation and treatment as clinically indicated and appropriate  The patient will follow up with their current PCP if and as advised. If the patient does not currently have a PCP we will assist them in obtaining one.   The patient may need specialty follow up if the symptoms continue, in spite of conservative treatment and management, for further workup, evaluation, consultation and treatment as clinically indicated and appropriate.  Patient/parent/caregiver verbalized understanding and agreement of plan as discussed.  All questions were addressed during visit.  Please see discharge instructions below for further details of plan.  Discharge  Instructions:   Discharge Instructions      Please read below to learn more about the medications, dosages and frequencies that I recommend to help alleviate your symptoms and to get you feeling better soon:   Medrol Dosepak (methylprednisolone): This is a steroid that will significantly calm your upper and lower airways, please take one row of tablets daily with your breakfast meal starting tomorrow morning until the prescription is complete.      Combivent, DuoNeb (ipratropium and albuterol): This inhaled medication contains a short acting beta agonist bronchodilator and a muscarinic bronchodilator.  This medication works on the smooth muscle that opens and constricts of your airways by relaxing the muscle.  The result of relaxation of the smooth muscle is increased air movement and improved work of breathing.  This is a short acting medication that can be used every 4-6 hours as needed for increased work of breathing, shortness of breath, wheezing and excessive coughing.    Please use DuoNebs in addition to her regular dose of 2 puffs of Stiolto and instead of albuterol neb treatments.  Please also add her budesonide to these treatments morning and evening until she is feeling significantly better or instructed to do otherwise by your pulmonologist.  Please follow-up with your pulmonologist for repeat evaluation you of your lungs in the next 3 to 4 days to ensure that you are improving and to discuss whether or not your treatment regimen needs to be adjusted.        Thank you for visiting urgent care today.  We appreciate the opportunity to participate in your care.         This office note has been dictated using Museum/gallery curator.  Unfortunately, this method of dictation can sometimes lead to typographical or grammatical errors.  I apologize for your inconvenience in advance if this occurs.  Please do not hesitate to reach  out to me if clarification is needed.

## 2021-12-28 ENCOUNTER — Encounter: Payer: Self-pay | Admitting: Adult Health

## 2021-12-28 ENCOUNTER — Ambulatory Visit (INDEPENDENT_AMBULATORY_CARE_PROVIDER_SITE_OTHER): Payer: Medicare HMO

## 2021-12-28 ENCOUNTER — Other Ambulatory Visit: Payer: Medicare HMO

## 2021-12-28 ENCOUNTER — Ambulatory Visit: Payer: Medicare HMO | Admitting: Adult Health

## 2021-12-28 VITALS — BP 132/86 | HR 79 | Temp 98.1°F | Ht 64.0 in | Wt 135.8 lb

## 2021-12-28 DIAGNOSIS — R7303 Prediabetes: Secondary | ICD-10-CM | POA: Diagnosis not present

## 2021-12-28 DIAGNOSIS — J449 Chronic obstructive pulmonary disease, unspecified: Secondary | ICD-10-CM | POA: Diagnosis not present

## 2021-12-28 DIAGNOSIS — E785 Hyperlipidemia, unspecified: Secondary | ICD-10-CM

## 2021-12-28 DIAGNOSIS — E039 Hypothyroidism, unspecified: Secondary | ICD-10-CM

## 2021-12-28 DIAGNOSIS — J90 Pleural effusion, not elsewhere classified: Secondary | ICD-10-CM | POA: Diagnosis not present

## 2021-12-28 DIAGNOSIS — Z0000 Encounter for general adult medical examination without abnormal findings: Secondary | ICD-10-CM

## 2021-12-28 DIAGNOSIS — I10 Essential (primary) hypertension: Secondary | ICD-10-CM

## 2021-12-28 DIAGNOSIS — R1110 Vomiting, unspecified: Secondary | ICD-10-CM | POA: Diagnosis not present

## 2021-12-28 MED ORDER — AMOXICILLIN-POT CLAVULANATE 875-125 MG PO TABS: 1.0000 | ORAL_TABLET | Freq: Two times a day (BID) | ORAL | 0 refills | Status: DC

## 2021-12-28 NOTE — Patient Instructions (Addendum)
Begin Augmentin Twice daily  for 1 week , take with food.  Finish Medrol dose pack as directed  Mucinex DM Twice daily  As needed  cough/congestion .  Extra Lasix '20mg'$  daily tomorrow only .  Continue Pulmicort Neb Twice daily   Continue on Stiolto daily  Duoneb As needed   Activity as tolerated  Follow up with Dr. Chase Caller or Mery Guadalupe NP in 2-3 weeks and As needed  with chest xray Please contact office for sooner follow up if symptoms do not improve or worsen or seek emergency care

## 2021-12-28 NOTE — Assessment & Plan Note (Signed)
Slow to resolve COPD exacerbation plus or minus pneumonia and volume overload.  We will add in empiric antibiotics.  Gentle diuresis.  Patient did have lab work today earlier by primary care. We will follow closely. Patient is advised if symptoms worsen or she is unable to eat or drink she is to contact us immediately or go to the closest emergency room.  Plan  Patient Instructions  Begin Augmentin Twice daily  for 1 week , take with food.  Finish Medrol dose pack as directed  Mucinex DM Twice daily  As needed  cough/congestion .  Extra Lasix '20mg'$  daily tomorrow only .  Continue Pulmicort Neb Twice daily   Continue on Stiolto daily  Duoneb As needed   Activity as tolerated  Follow up with Dr. Chase Caller or Kamica Florance NP in 2-3 weeks and As needed  with chest xray Please contact office for sooner follow up if symptoms do not improve or worsen or seek emergency care

## 2021-12-28 NOTE — Progress Notes (Signed)
@Patient  ID: Donna Arias, female    DOB: July 27, 1926, 88 y.o.   MRN: [id number]  Chief Complaint  Patient presents with   Follow-up    Referring provider: Mayer Masker, PA-C  HPI: 88 year old female former smoker followed for COPD Medical significant for chronic sinusitis and dementia , dysphagia .   TEST/EVENTS :   12/28/2021 Acute OV : COPD  Patient presents for an acute office visit.  Complains over the last 3 weeks that she has had increased cough, congestion, nasal stuffiness and wheezing.  She was seen in the emergency room on December 25, 2021 and treated for COPD exacerbation. Started her on Medrol dose pack. No fever or hemoptysis.  Patient has dementia and is accompanied by family member.  Denies any increased confusion or behavior issues. Patient remains on Stiolto inhaler daily and Pulmicort nebulizer twice daily.  She has to use as needed Several family members have been sick with URI symptoms.  No fever .  Did have initial nausea and vomiting.  That has resolved.  Appetite is slowly improving.  Has a known history of dysphagia. Chest x-ray today shows increased interstitial markings and small bilateral pleural effusions.     Allergies  Allergen Reactions   Picato [Ingenol Mebutate] Hives   Codeine Sulfate Other (See Comments)    REACTION: unspecified    Immunization History  Administered Date(s) Administered   Fluad Quad(high Dose 65+) 11/06/2018, 12/11/2019, 11/07/2020, 12/07/2021   H1N1 02/26/2008   Influenza Split 12/02/2010, 10/07/2011   Influenza Whole 11/17/2006, 10/27/2007, 12/01/2009   Influenza, High Dose Seasonal PF 10/15/2014, 11/10/2015, 10/25/2016, 10/27/2017   Influenza,inj,Quad PF,6+ Mos 11/10/2012, 09/18/2013   PFIZER(Purple Top)SARS-COV-2 Vaccination 01/30/2019, 04/01/2019, 05/01/2019   Pneumococcal Conjugate-13 04/30/2013   Pneumococcal Polysaccharide-23 01/26/2003   Td 01/25/1997, 02/26/2008   Zoster Recombinat (Shingrix) 11/30/2019,  01/31/2020   Zoster, Live 04/08/2008    Past Medical History:  Diagnosis Date   Chronic headaches    COPD (chronic obstructive pulmonary disease) (HCC)    Dementia (HCC)    Eye infection, left 01/16/2018   GERD (gastroesophageal reflux disease)    Heart attack (HCC) 1998   HLD (hyperlipidemia)    Hypertension    Skin cancer    squamous cell of left face   Squamous cell carcinoma of skin 11/03/2021   L zygoma - poorly differentiated   Stroke (HCC)    Thyroid disease     Tobacco History: Social History   Tobacco Use  Smoking Status Former   Types: Cigarettes   Quit date: 1995   Years since quitting: 28.9  Smokeless Tobacco Never   Counseling given: Not Answered   Outpatient Medications Prior to Visit  Medication Sig Dispense Refill   albuterol (PROVENTIL) (2.5 MG/3ML) 0.083% nebulizer solution Take 3 mLs (2.5 mg total) by nebulization every 6 (six) hours as needed for wheezing or shortness of breath. 75 mL 6   amLODipine (NORVASC) 2.5 MG tablet Take 1 tablet (2.5 mg total) by mouth daily. 90 tablet 1   aspirin 81 MG tablet Take 81 mg by mouth daily.     atorvastatin (LIPITOR) 10 MG tablet Take 1 tablet (10 mg total) by mouth daily. 90 tablet 1   benzonatate (TESSALON) 100 MG capsule Take 1 capsule (100 mg total) by mouth 3 (three) times daily as needed for cough. 30 capsule 1   budesonide (PULMICORT) 0.25 MG/2ML nebulizer solution Take 2 mLs (0.25 mg total) by nebulization in the morning and at bedtime. 120 mL 11  Calcium Carbonate-Vit D-Min (CALCIUM 1200 PO) Take 1 tablet by mouth.     erythromycin ophthalmic ointment Place 1 application into the left eye at bedtime. 3.5 g 2   fexofenadine (ALLEGRA) 180 MG tablet Take 180 mg by mouth daily.     FLUoxetine (PROZAC) 10 MG capsule TAKE 1 CAPSULE BY MOUTH DAILY. 90 capsule 1   fluticasone (FLONASE) 50 MCG/ACT nasal spray Place 1 spray into both nostrils 2 (two) times daily. 16 g 6   furosemide (LASIX) 20 MG tablet TAKE 1  TABLET BY MOUTH DAILY. 90 tablet 0   ipratropium-albuterol (DUONEB) 0.5-2.5 (3) MG/3ML SOLN Take 3 mLs by nebulization every 4 (four) hours as needed. 360 mL 1   ketoconazole (NIZORAL) 2 % shampoo APPLY TOPICALLY 2 TIMES A WEEK. 120 mL 1   levothyroxine (SYNTHROID) 25 MCG tablet TAKE 1/2 TABLET BY MOUTH DAILY WITH 50 MCG TO EQUAL 62.5 MCG TOTAL DAILY 45 tablet 0   levothyroxine (SYNTHROID) 50 MCG tablet TAKE 1 TABLET BY MOUTH EVERY DAY 90 tablet 1   methylPREDNISolone (MEDROL DOSEPAK) 4 MG TBPK tablet Take 24 mg on day 1, 20 mg on day 2, 16 mg on day 3, 12 mg on day 4, 8 mg on day 5, 4 mg on day 6.  Take all tablets in each row at once, do not spread tablets out throughout the day. 21 tablet 0   mupirocin ointment (BACTROBAN) 2 % APPLY TO AFFECTED AREA ON THE SKIN 2 TIMES A DAY 22 g 0   mupirocin ointment (BACTROBAN) 2 % Apply 1 Application topically daily. With dressing changes 22 g 0   nystatin (MYCOSTATIN/NYSTOP) powder Apply 1 Application topically 2 (two) times daily. 60 g 2   potassium chloride SA (KLOR-CON M) 20 MEQ tablet Take 1 tablet (20 mEq total) by mouth daily. 90 tablet 1   risperiDONE (RISPERDAL) 1 MG tablet TAKE 1 TABLET BY MOUTH 2 TIMES DAILY. 180 tablet 1   Spacer/Aero-Holding Chambers (AEROCHAMBER PLUS WITH MASK) inhaler Use with Stiolto inhalations. 1 each 0   STIOLTO RESPIMAT 2.5-2.5 MCG/ACT AERS INHALE 2 PUFFS INTO THE LUNGS DAILY. 4 g 2   triamcinolone cream (KENALOG) 0.1 % Apply 1 application topically 2 (two) times daily. Request jar 453 g 0   No facility-administered medications prior to visit.     Review of Systems:   Constitutional:   No  weight loss, night sweats,  Fevers, chills,  +fatigue, or  lassitude.  HEENT:   No headaches,  Difficulty swallowing,  Tooth/dental problems, or  Sore throat,                No sneezing, itching, ear ache, nasal congestion, post nasal drip,   CV:  No chest pain,  Orthopnea, PND, swelling in lower extremities, anasarca,  dizziness, palpitations, syncope.   GI  No heartburn, indigestion, abdominal pain, nausea, vomiting, diarrhea, change in bowel habits, loss of appetite, bloody stools.   Resp:  No chest wall deformity  Skin: no rash or lesions.  GU: no dysuria, change in color of urine, no urgency or frequency.  No flank pain, no hematuria   MS:  No joint pain or swelling.  No decreased range of motion.  No back pain.    Physical Exam  BP 132/86 (BP Location: Right Arm, Patient Position: Sitting, Cuff Size: Normal)   Pulse 79   Temp 98.1 F (36.7 C) (Oral)   Ht 5\' 4"  (1.626 m)   Wt 135 lb 12.8 oz (61.6 kg)  SpO2 96%   BMI 23.31 kg/m   GEN: A/Ox3; pleasant , NAD, well nourished    HEENT:  /AT,   NOSE-clear, THROAT-clear, no lesions, no postnasal drip or exudate noted.   NECK:  Supple w/ fair ROM; no JVD; normal carotid impulses w/o bruits; no thyromegaly or nodules palpated; no lymphadenopathy.    RESP  Clear  P & A; w/o, wheezes/ rales/ or rhonchi. no accessory muscle use, no dullness to percussion  CARD:  RRR, no m/r/g, tr  peripheral edema, pulses intact, no cyanosis or clubbing.  GI:   Soft & nt; nml bowel sounds; no organomegaly or masses detected.   Musco: Warm bil, no deformities or joint swelling noted.   Neuro: alert, no focal deficits noted.    Skin: Warm, no lesions or rashes    Lab Results:   BNP No results found for: "BNP"  ProBNP No results found for: "PROBNP"  Imaging: DG Chest 2 View  Result Date: 12/28/2021 CLINICAL DATA:  Vomiting, COPD EXAM: CHEST - 2 VIEW COMPARISON:  None Available. FINDINGS: Transverse diameter of heart is increased. Central pulmonary vessels are prominent. There is interval increase in interstitial markings in parahilar regions and lower lung fields. Small bilateral pleural effusions are seen. IMPRESSION: Cardiomegaly. Increased interstitial markings are seen in mid and lower lung fields on both sides suggesting interstitial pulmonary  edema or interstitial pneumonia. Small bilateral pleural effusions are seen. Electronically Signed   By: Ernie Avena M.D.   On: 12/28/2021 16:01           No data to display          No results found for: "NITRICOXIDE"      Assessment & Plan:   COPD (chronic obstructive pulmonary disease) (HCC) Slow to resolve COPD exacerbation plus or minus pneumonia and volume overload.  We will add in empiric antibiotics.  Gentle diuresis.  Patient did have lab work today earlier by primary care. We will follow closely. Patient is advised if symptoms worsen or she is unable to eat or drink she is to contact us immediately or go to the closest emergency room.  Plan  Patient Instructions  Begin Augmentin Twice daily  for 1 week , take with food.  Finish Medrol dose pack as directed  Mucinex DM Twice daily  As needed  cough/congestion .  Extra Lasix 20mg  daily tomorrow only .  Continue Pulmicort Neb Twice daily   Continue on Stiolto daily  Duoneb As needed   Activity as tolerated  Follow up with Dr. Marchelle Gearing or Volanda Mangine NP in 2-3 weeks and As needed  with chest xray Please contact office for sooner follow up if symptoms do not improve or worsen or seek emergency care        I spent   40 minutes dedicated to the care of this patient on the date of this encounter to include pre-visit review of records, face-to-face time with the patient discussing conditions above, post visit ordering of testing, clinical documentation with the electronic health record, making appropriate referrals as documented, and communicating necessary findings to members of the patients care team.   Rubye Oaks, NP 12/28/2021

## 2021-12-29 ENCOUNTER — Telehealth: Payer: Self-pay | Admitting: Adult Health

## 2021-12-29 LAB — COMPREHENSIVE METABOLIC PANEL
ALT: 18 IU/L (ref 0–32)
AST: 19 IU/L (ref 0–40)
Albumin/Globulin Ratio: 1.4 (ref 1.2–2.2)
Albumin: 3.9 g/dL (ref 3.6–4.6)
Alkaline Phosphatase: 77 IU/L (ref 44–121)
BUN/Creatinine Ratio: 18 (ref 12–28)
BUN: 18 mg/dL (ref 10–36)
Bilirubin Total: 0.4 mg/dL (ref 0.0–1.2)
CO2: 21 mmol/L (ref 20–29)
Calcium: 10.2 mg/dL (ref 8.7–10.3)
Chloride: 100 mmol/L (ref 96–106)
Creatinine, Ser: 1.02 mg/dL — ABNORMAL HIGH (ref 0.57–1.00)
Globulin, Total: 2.8 g/dL (ref 1.5–4.5)
Glucose: 111 mg/dL — ABNORMAL HIGH (ref 70–99)
Potassium: 4.3 mmol/L (ref 3.5–5.2)
Sodium: 138 mmol/L (ref 134–144)
Total Protein: 6.7 g/dL (ref 6.0–8.5)
eGFR: 51 mL/min/{1.73_m2} — ABNORMAL LOW (ref >59)

## 2021-12-29 LAB — CBC WITH DIFFERENTIAL/PLATELET
Basophils Absolute: 0.1 10*3/uL (ref 0.0–0.2)
Basos: 1 %
EOS (ABSOLUTE): 0.1 10*3/uL (ref 0.0–0.4)
Eos: 2 %
Hematocrit: 43.1 % (ref 34.0–46.6)
Hemoglobin: 14.5 g/dL (ref 11.1–15.9)
Immature Grans (Abs): 0 10*3/uL (ref 0.0–0.1)
Immature Granulocytes: 0 %
Lymphocytes Absolute: 2.2 10*3/uL (ref 0.7–3.1)
Lymphs: 30 %
MCH: 30.6 pg (ref 26.6–33.0)
MCHC: 33.6 g/dL (ref 31.5–35.7)
MCV: 91 fL (ref 79–97)
Monocytes Absolute: 1 10*3/uL — ABNORMAL HIGH (ref 0.1–0.9)
Monocytes: 13 %
Neutrophils Absolute: 4 10*3/uL (ref 1.4–7.0)
Neutrophils: 54 %
Platelets: 280 10*3/uL (ref 150–450)
RBC: 4.74 x10E6/uL (ref 3.77–5.28)
RDW: 12.4 % (ref 11.7–15.4)
WBC: 7.3 10*3/uL (ref 3.4–10.8)

## 2021-12-29 LAB — HEMOGLOBIN A1C
Est. average glucose Bld gHb Est-mCnc: 131 mg/dL
Hgb A1c MFr Bld: 6.2 % — ABNORMAL HIGH (ref 4.8–5.6)

## 2021-12-29 LAB — LIPID PANEL
Chol/HDL Ratio: 2.9 ratio (ref 0.0–4.4)
Cholesterol, Total: 173 mg/dL (ref 100–199)
HDL: 60 mg/dL (ref >39)
LDL Chol Calc (NIH): 98 mg/dL (ref 0–99)
Triglycerides: 80 mg/dL (ref 0–149)
VLDL Cholesterol Cal: 15 mg/dL (ref 5–40)

## 2021-12-29 LAB — TSH: TSH: 3.5 u[IU]/mL (ref 0.450–4.500)

## 2021-12-30 NOTE — Progress Notes (Signed)
[facility] Note  01/04/2022     CHIEF COMPLAINT Patient presents for Retina Follow Up   HISTORY OF PRESENT ILLNESS: Donna Arias is a 88 y.o. female who presents to the clinic today for:   HPI     Retina Follow Up   Patient presents with  Wet AMD.  In right eye.  This started months ago.  Duration of 7 weeks.  Since onset it is stable.  I, the attending physician,  performed the HPI with the patient and updated documentation appropriately.        Comments   Patient states that she can tell if there has been any vision changes. She is using AT's OU PRN.       Last edited by Bernarda Caffey, MD on 01/04/2022  1:54 PM.    Pt states no change in vision, no problems after last injection   Referring physician: Lorrene Reid, PA-C No address on file  HISTORICAL INFORMATION:   Selected notes from the [place] Previous Dr. Zadie Rhine pt LEE: 08.16.23 Ocular Hx- ex ARMD OU, CRAO OS PMH-    CURRENT MEDICATIONS: Current Outpatient Medications (Ophthalmic Drugs)  Medication Sig   erythromycin ophthalmic ointment Place 1 application into the left eye at bedtime.   No current facility-administered medications for this visit. (Ophthalmic Drugs)   Current Outpatient Medications (Other)  Medication Sig   albuterol (PROVENTIL) (2.5 MG/3ML) 0.083% nebulizer solution Take 3 mLs (2.5 mg total) by nebulization every 6 (six) hours as needed for wheezing or shortness of breath.   amLODipine (NORVASC) 2.5 MG tablet Take 1 tablet (2.5 mg total) by mouth daily.   amoxicillin-clavulanate (AUGMENTIN) 875-125 MG tablet Take 1 tablet by mouth 2 (two) times daily.   aspirin 81 MG tablet Take 81 mg by mouth daily.   atorvastatin (LIPITOR) 10 MG tablet Take 1 tablet (10 mg total) by mouth daily.   benzonatate (TESSALON) 100 MG capsule Take 1 capsule (100 mg total) by mouth 3 (three) times daily as needed for cough.   budesonide (PULMICORT) 0.25 MG/2ML  nebulizer solution Take 2 mLs (0.25 mg total) by nebulization in the morning and at bedtime.   Calcium Carbonate-Vit D-Min (CALCIUM 1200 PO) Take 1 tablet by mouth.   fexofenadine (ALLEGRA) 180 MG tablet Take 180 mg by mouth daily.   FLUoxetine (PROZAC) 10 MG capsule TAKE 1 CAPSULE BY MOUTH DAILY.   fluticasone (FLONASE) 50 MCG/ACT nasal spray Place 1 spray into both nostrils 2 (two) times daily.   furosemide (LASIX) 20 MG tablet TAKE 1 TABLET BY MOUTH DAILY.   ipratropium-albuterol (DUONEB) 0.5-2.5 (3) MG/3ML SOLN Take 3 mLs by nebulization every 4 (four) hours as needed.   ketoconazole (NIZORAL) 2 % shampoo APPLY TOPICALLY 2 TIMES A WEEK.   levothyroxine (SYNTHROID) 25 MCG tablet TAKE 1/2 TABLET BY MOUTH DAILY WITH 50 MCG TO EQUAL 62.5 MCG TOTAL DAILY   levothyroxine (SYNTHROID) 50 MCG tablet TAKE 1 TABLET BY MOUTH EVERY DAY   methylPREDNISolone (MEDROL DOSEPAK) 4 MG TBPK tablet Take 24 mg on day 1, 20 mg on day 2, 16 mg on day 3, 12 mg on day 4, 8 mg on day 5, 4 mg on day 6.  Take all tablets in each row at once, do not spread tablets out throughout the day.   mupirocin ointment (BACTROBAN) 2 % APPLY TO AFFECTED AREA ON THE SKIN 2 TIMES A DAY   mupirocin ointment (BACTROBAN) 2 % Apply 1 Application  topically daily. With dressing changes   nystatin (MYCOSTATIN/NYSTOP) powder Apply 1 Application topically 2 (two) times daily.   potassium chloride SA (KLOR-CON M) 20 MEQ tablet Take 1 tablet (20 mEq total) by mouth daily.   risperiDONE (RISPERDAL) 1 MG tablet TAKE 1 TABLET BY MOUTH 2 TIMES DAILY.   Spacer/Aero-Holding Chambers (AEROCHAMBER PLUS WITH MASK) inhaler Use with Stiolto inhalations.   STIOLTO RESPIMAT 2.5-2.5 MCG/ACT AERS INHALE 2 PUFFS INTO THE LUNGS DAILY.   triamcinolone cream (KENALOG) 0.1 % Apply 1 application topically 2 (two) times daily. Request jar   No current facility-administered medications for this visit. (Other)   REVIEW OF SYSTEMS: ROS   Positive for: Musculoskeletal,  Cardiovascular, Eyes, Respiratory Last edited by Annie Paras, COT on 01/04/2022  1:10 PM.      ALLERGIES Allergies  Allergen Reactions   Picato [Ingenol Mebutate] Hives   Codeine Sulfate Other (See Comments)    REACTION: unspecified   PAST MEDICAL HISTORY Past Medical History:  Diagnosis Date   Chronic headaches    COPD (chronic obstructive pulmonary disease) ([place])    Dementia ([place])    Eye infection, left 01/16/2018   GERD (gastroesophageal reflux disease)    Heart attack ([place]) 1998   HLD (hyperlipidemia)    Hypertension    Skin cancer    squamous cell of left face   Squamous cell carcinoma of skin 11/03/2021   L zygoma - poorly differentiated   Stroke ([place])    Thyroid disease    Past Surgical History:  Procedure Laterality Date   BREAST LUMPECTOMY Left 1995   DILATION AND CURETTAGE OF [place] HISTORY Family History  Problem Relation Age of Onset   Hypertension Mother    Stroke Mother    Stroke Sister    Alzheimer's disease Sister    Stroke Sister    Hypertension Sister    Stroke Sister    Breast cancer Sister    SOCIAL HISTORY Social History   Tobacco Use   Smoking status: Former    Types: Cigarettes    Quit date: 1995    Years since quitting: 28.9   Smokeless tobacco: Never  Vaping Use   Vaping Use: Never used  Substance Use Topics   Alcohol use: No   Drug use: No       OPHTHALMIC EXAM:  Base Eye Exam     Visual Acuity (Snellen - Linear)       Right Left   Dist cc 20/40 CF at 3'   Dist ph cc NI NI    Correction: Glasses         Tonometry (Tonopen, 1:14 PM)       Right Left   Pressure 6 9         Pupils       Dark Light Shape React APD   Right 3 3 Round Minimal None   Left 3 3 Round Minimal None         Visual Fields       Left Right    Full Full         Extraocular Movement       Right Left    Full, Ortho Full, Ortho         Neuro/Psych     Oriented  x3: Yes   Mood/Affect: Normal         Dilation     Both eyes: 1.0% Mydriacyl, 2.5% Phenylephrine @  1:11 PM           Slit Lamp and Fundus Exam     External Exam       Right Left   External Normal Normal         Slit Lamp Exam       Right Left   Lids/Lashes Dermatochalasis - upper lid, Meibomian gland dysfunction Dermatochalasis - upper lid, Meibomian gland dysfunction   Conjunctiva/Sclera White and quiet White and quiet   Cornea arcus, trace PEE, well healed cataract wound arcus, well healed cataract wound, 1-2+ inferior Punctate epithelial erosions   Anterior Chamber deep and clear deep and clear   Iris Round and dilated Round and dilated   Lens PC IOL in good position, trace Posterior capsular opacification PC IOL in good position   Anterior Vitreous Vitreous syneresis Vitreous syneresis         Fundus Exam       Right Left   Disc mild Pallor, Sharp rim, PPA, mild disc heme at 0700 2+Pallor, Sharp rim, 360 PPA   C/D Ratio 0.2 0.6   Macula Flat, Blunted foveal reflex, Drusen, RPE mottling and clumping, central CNV with trace cystic changes / edema -- improved, focal central pigment clump, telangiectasias inferior mac, no heme Flat, Blunted foveal reflex, central RPE atrophy, RPE mottling and clumping, diffuse atrophy, no heme   Vessels attenuated, Tortuous Severer attenuation, mild tortuosity   Periphery Attached, No heme Attached, scattered CR scars           IMAGING AND PROCEDURES  Imaging and Procedures for 01/04/2022  OCT, Retina - OU - Both Eyes       Right Eye Quality was good. Scan locations included subfoveal. Central Foveal Thickness: 272. Progression has improved. Findings include normal foveal contour, no IRF, no SRF, retinal drusen , subretinal hyper-reflective material, intraretinal hyper-reflective material, epiretinal membrane, pigment epithelial detachment, outer retinal atrophy (Interval improvement in central cystic changes nasal fovea).    Left Eye Quality was good. Scan locations included nasal. Central Foveal Thickness: 193. Progression has improved. Findings include no IRF, no SRF, abnormal foveal contour, retinal drusen , subretinal hyper-reflective material, inner retinal atrophy, outer retinal atrophy (Diffuse retinal atrophy / ischemia, central SRHM / scar with surrounding cystic changes - improved).   Notes *Images captured and stored on drive  Diagnosis / Impression:  OD: ex ARMD -- Interval improvement in central cystic changes nasal fovea OS: Diffuse retinal atrophy / ischemia, central SRHM / scar with surrounding cystic changes - improved  Clinical management:  See below  Abbreviations: NFP - Normal foveal profile. CME - cystoid macular edema. PED - pigment epithelial detachment. IRF - intraretinal fluid. SRF - subretinal fluid. EZ - ellipsoid zone. ERM - epiretinal membrane. ORA - outer retinal atrophy. ORT - outer retinal tubulation. SRHM - subretinal hyper-reflective material. IRHM - intraretinal hyper-reflective material      Intravitreal Injection, Pharmacologic Agent - OD - Right Eye       Time Out 01/04/2022. 1:54 PM. Confirmed correct patient, procedure, site, and patient consented.   Anesthesia Topical anesthesia was used. Anesthetic medications included Lidocaine 2%, Proparacaine 0.5%.   Procedure Preparation included 5% betadine to ocular surface, eyelid speculum. A (32g) needle was used.   Injection: 1.25 mg Bevacizumab 1.65m/0.05ml   Route: Intravitreal, Site: Right Eye   NDC: 50242-060-01, Lot:: [id number] Expiration date: 01/10/2022   Post-op Post injection exam found visual acuity of at least counting fingers. The patient tolerated the procedure well. There were  no complications. The patient received written and verbal post procedure care education.            ASSESSMENT/PLAN:    ICD-10-CM   1. Exudative age-related macular degeneration of right eye with active choroidal  neovascularization (HCC)  H35.3211 OCT, Retina - OU - Both Eyes    Intravitreal Injection, Pharmacologic Agent - OD - Right Eye    Bevacizumab (AVASTIN) SOLN 1.25 mg    2. Exudative age-related macular degeneration of left eye with inactive choroidal neovascularization ([place])  H35.3222     3. Retinal ischemia  H35.82     4. Essential hypertension  I10     5. Hypertensive retinopathy of both eyes  H35.033     6. Pseudophakia of both eyes  Z96.1      Exudative age related macular degeneration, OD  - previous patient of Dr. Zadie Rhine   - history of IVA OD x16 since November 2021-08.17.23 recorded in Epic  **history of increased IRF at 9 wks on 10.23.23 visit**  - s/p IVA OD #17 (10.23.23)  - OCT shows OD: Interval improvement in central cystic changes nasal fovea at 7 weeks  - recommend IVA OD #18 today, 12.11.23 with follow up extended to 8 weeks - pt wishes to be treated with IVA OD - RBA of procedure discussed, questions answered - IVA informed consent obtained and signed, 10.23.23 (OD) - see procedure note  - f/u in 8 wks -- DFE/OCT, possible injection  2,3. Exudative age related macular degeneration with retinal ischemia OS -- inactive  - pt and daughter report hx of CRAO OS - exam shows central retinal atrophy  - OCT shows Diffuse retinal atrophy / ischemia, central SRHM / scar with surrounding cystic changes--trace  - f/u in 8 wks -- DFE/OCT  4,5. Hypertensive retinopathy OU - discussed importance of tight BP control - monitor  6. Pseudophakia OU  - s/p CE/IOL OU  - IOLs in good position, doing well  - monitor  Ophthalmic Meds Ordered this visit:  Meds ordered this encounter  Medications   Bevacizumab (AVASTIN) SOLN 1.25 mg     Return in about 8 weeks (around 03/01/2022) for f/u exu ARMD OD, DFE, OCT.  There are no Patient Instructions on file for this visit.   Explained the diagnoses, plan, and follow up with the patient and they expressed understanding.  Patient  expressed understanding of the importance of proper follow up care.   This document serves as a record of services personally performed by Gardiner Sleeper, MD, PhD. It was created on their behalf by San Jetty. Owens Shark, OA an ophthalmic technician. The creation of this record is the provider's dictation and/or activities during the visit.    Electronically signed by: San Jetty. Owens Shark, [place] 12.06.2023 12:06 AM  Gardiner Sleeper, M.D., Ph.D. Diseases & Surgery of the Retina and Vitreous [facility]  I have reviewed the above documentation for accuracy and completeness, and I agree with the above. Gardiner Sleeper, M.D., Ph.D. 01/05/22 12:07 AM   Abbreviations: M myopia (nearsighted); A astigmatism; H hyperopia (farsighted); P presbyopia; Mrx spectacle prescription;  CTL contact lenses; OD right eye; OS left eye; OU both eyes  XT exotropia; ET esotropia; PEK punctate epithelial keratitis; PEE punctate epithelial erosions; DES dry eye syndrome; MGD meibomian gland dysfunction; ATs artificial tears; PFAT's preservative free artificial tears; [place] nuclear sclerotic cataract; PSC posterior subcapsular cataract; ERM epi-retinal membrane; PVD posterior vitreous detachment; RD retinal detachment; DM diabetes  mellitus; DR diabetic retinopathy; NPDR non-proliferative diabetic retinopathy; PDR proliferative diabetic retinopathy; CSME clinically significant macular edema; DME diabetic macular edema; dbh dot blot hemorrhages; CWS cotton wool spot; POAG primary open angle glaucoma; C/D cup-to-disc ratio; HVF humphrey visual field; GVF goldmann visual field; OCT optical coherence tomography; IOP intraocular pressure; BRVO Branch retinal vein occlusion; CRVO central retinal vein occlusion; CRAO central retinal artery occlusion; BRAO branch retinal artery occlusion; RT retinal tear; SB scleral buckle; PPV pars plana vitrectomy; VH Vitreous hemorrhage; PRP panretinal laser photocoagulation; IVK intravitreal  kenalog; VMT vitreomacular traction; MH Macular hole;  NVD neovascularization of the disc; NVE neovascularization elsewhere; AREDS age related eye disease study; ARMD age related macular degeneration; POAG primary open angle glaucoma; EBMD epithelial/anterior basement membrane dystrophy; ACIOL anterior chamber intraocular lens; IOL intraocular lens; PCIOL posterior chamber intraocular lens; Phaco/IOL phacoemulsification with intraocular lens placement; [place] photorefractive keratectomy; LASIK laser assisted in situ keratomileusis; HTN hypertension; DM diabetes mellitus; COPD chronic obstructive pulmonary disease

## 2021-12-30 NOTE — Telephone Encounter (Signed)
ATC patients daughter Donna Arias. LVMTCB.

## 2021-12-30 NOTE — Telephone Encounter (Signed)
I spoke with the pt's daughter  She states she was concerned yesterday that pt was not urinating enough  She would urinate only when coughing or sneezing  After the time or her call yesterday, this improved and she is not having any issues  Will continue to monitor and call if needed

## 2022-01-01 ENCOUNTER — Telehealth: Payer: Self-pay | Admitting: Adult Health

## 2022-01-04 ENCOUNTER — Encounter (INDEPENDENT_AMBULATORY_CARE_PROVIDER_SITE_OTHER): Payer: Self-pay | Admitting: Ophthalmology

## 2022-01-04 ENCOUNTER — Ambulatory Visit (INDEPENDENT_AMBULATORY_CARE_PROVIDER_SITE_OTHER): Payer: Medicare HMO | Admitting: Ophthalmology

## 2022-01-04 DIAGNOSIS — H353222 Exudative age-related macular degeneration, left eye, with inactive choroidal neovascularization: Secondary | ICD-10-CM | POA: Diagnosis not present

## 2022-01-04 DIAGNOSIS — H3582 Retinal ischemia: Secondary | ICD-10-CM

## 2022-01-04 DIAGNOSIS — H35033 Hypertensive retinopathy, bilateral: Secondary | ICD-10-CM

## 2022-01-04 DIAGNOSIS — Z961 Presence of intraocular lens: Secondary | ICD-10-CM

## 2022-01-04 DIAGNOSIS — I10 Essential (primary) hypertension: Secondary | ICD-10-CM

## 2022-01-04 DIAGNOSIS — H353211 Exudative age-related macular degeneration, right eye, with active choroidal neovascularization: Secondary | ICD-10-CM

## 2022-01-04 MED ORDER — BEVACIZUMAB CHEMO INJECTION 1.25MG/0.05ML SYRINGE FOR KALEIDOSCOPE
1.2500 mg | INTRAVITREAL | Status: AC | PRN
  Administered 2022-01-04: 1.25 mg via INTRAVITREAL

## 2022-01-04 NOTE — Telephone Encounter (Signed)
Attempted to call pt but unable to reach. Left message to return call.  

## 2022-01-08 ENCOUNTER — Emergency Department (HOSPITAL_COMMUNITY): Payer: Medicare HMO

## 2022-01-08 ENCOUNTER — Other Ambulatory Visit: Payer: Self-pay

## 2022-01-08 ENCOUNTER — Encounter (HOSPITAL_COMMUNITY): Payer: Self-pay

## 2022-01-08 ENCOUNTER — Emergency Department (HOSPITAL_COMMUNITY)
Admission: EM | Admit: 2022-01-08 | Discharge: 2022-01-09 | Disposition: A | Discharge status: Home / Self Care | Payer: Medicare HMO | Attending: Emergency Medicine | Admitting: Emergency Medicine

## 2022-01-08 DIAGNOSIS — I10 Essential (primary) hypertension: Secondary | ICD-10-CM | POA: Insufficient documentation

## 2022-01-08 DIAGNOSIS — R1110 Vomiting, unspecified: Secondary | ICD-10-CM

## 2022-01-08 DIAGNOSIS — J90 Pleural effusion, not elsewhere classified: Secondary | ICD-10-CM | POA: Insufficient documentation

## 2022-01-08 DIAGNOSIS — R109 Unspecified abdominal pain: Secondary | ICD-10-CM | POA: Diagnosis not present

## 2022-01-08 DIAGNOSIS — R059 Cough, unspecified: Secondary | ICD-10-CM | POA: Diagnosis not present

## 2022-01-08 DIAGNOSIS — R112 Nausea with vomiting, unspecified: Secondary | ICD-10-CM | POA: Insufficient documentation

## 2022-01-08 DIAGNOSIS — Z79899 Other long term (current) drug therapy: Secondary | ICD-10-CM | POA: Insufficient documentation

## 2022-01-08 DIAGNOSIS — J811 Chronic pulmonary edema: Secondary | ICD-10-CM | POA: Diagnosis not present

## 2022-01-08 DIAGNOSIS — R10816 Epigastric abdominal tenderness: Secondary | ICD-10-CM | POA: Diagnosis not present

## 2022-01-08 DIAGNOSIS — I700 Atherosclerosis of aorta: Secondary | ICD-10-CM | POA: Diagnosis not present

## 2022-01-08 DIAGNOSIS — Z7982 Long term (current) use of aspirin: Secondary | ICD-10-CM | POA: Diagnosis not present

## 2022-01-08 DIAGNOSIS — R197 Diarrhea, unspecified: Secondary | ICD-10-CM | POA: Diagnosis not present

## 2022-01-08 LAB — CBC WITH DIFFERENTIAL/PLATELET
Abs Immature Granulocytes: 0.02 10*3/uL (ref 0.00–0.07)
Basophils Absolute: 0 10*3/uL (ref 0.0–0.1)
Basophils Relative: 0 %
Eosinophils Absolute: 0 10*3/uL (ref 0.0–0.5)
Eosinophils Relative: 0 %
HCT: 41 % (ref 36.0–46.0)
Hemoglobin: 13.2 g/dL (ref 12.0–15.0)
Immature Granulocytes: 0 %
Lymphocytes Relative: 3 %
Lymphs Abs: 0.3 10*3/uL — ABNORMAL LOW (ref 0.7–4.0)
MCH: 31.4 pg (ref 26.0–34.0)
MCHC: 32.2 g/dL (ref 30.0–36.0)
MCV: 97.4 fL (ref 80.0–100.0)
Monocytes Absolute: 0.2 10*3/uL (ref 0.1–1.0)
Monocytes Relative: 2 %
Neutro Abs: 8.9 10*3/uL — ABNORMAL HIGH (ref 1.7–7.7)
Neutrophils Relative %: 95 %
Platelets: 172 10*3/uL (ref 150–400)
RBC: 4.21 MIL/uL (ref 3.87–5.11)
RDW: 15.5 % (ref 11.5–15.5)
WBC: 9.4 10*3/uL (ref 4.0–10.5)
nRBC: 0 % (ref 0.0–0.2)

## 2022-01-08 LAB — TROPONIN I (HIGH SENSITIVITY)
Troponin I (High Sensitivity): 19 ng/L — ABNORMAL HIGH (ref <18)
Troponin I (High Sensitivity): 21 ng/L — ABNORMAL HIGH (ref <18)

## 2022-01-08 LAB — COMPREHENSIVE METABOLIC PANEL
ALT: 28 U/L (ref 0–44)
AST: 40 U/L (ref 15–41)
Albumin: 3.9 g/dL (ref 3.5–5.0)
Alkaline Phosphatase: 92 U/L (ref 38–126)
Anion gap: 10 (ref 5–15)
BUN: 14 mg/dL (ref 8–23)
CO2: 22 mmol/L (ref 22–32)
Calcium: 9.3 mg/dL (ref 8.9–10.3)
Chloride: 104 mmol/L (ref 98–111)
Creatinine, Ser: 1.03 mg/dL — ABNORMAL HIGH (ref 0.44–1.00)
GFR, Estimated: 51 mL/min — ABNORMAL LOW (ref >60)
Glucose, Bld: 110 mg/dL — ABNORMAL HIGH (ref 70–99)
Potassium: 4.6 mmol/L (ref 3.5–5.1)
Sodium: 136 mmol/L (ref 135–145)
Total Bilirubin: 1.2 mg/dL (ref 0.3–1.2)
Total Protein: 7.4 g/dL (ref 6.5–8.1)

## 2022-01-08 LAB — LIPASE, BLOOD: Lipase: 29 U/L (ref 11–51)

## 2022-01-08 MED ORDER — FAMOTIDINE IN NACL 20-0.9 MG/50ML-% IV SOLN
20.0000 mg | Freq: Once | INTRAVENOUS | Status: AC
  Administered 2022-01-08: 20 mg via INTRAVENOUS
  Filled 2022-01-08: qty 50

## 2022-01-08 MED ORDER — ONDANSETRON HCL 4 MG/2ML IJ SOLN
4.0000 mg | Freq: Once | INTRAMUSCULAR | Status: AC
  Administered 2022-01-08: 4 mg via INTRAVENOUS
  Filled 2022-01-08: qty 2

## 2022-01-08 MED ORDER — SODIUM CHLORIDE 0.9 % IV BOLUS
500.0000 mL | Freq: Once | INTRAVENOUS | Status: AC
  Administered 2022-01-08: 500 mL via INTRAVENOUS

## 2022-01-08 NOTE — ED Notes (Signed)
Unable to do CT at this time, tech states we are waiting on labs. Lab states that their analyzer is down.

## 2022-01-08 NOTE — ED Notes (Signed)
Pt needs labs and 22 g or greater IV for CT

## 2022-01-08 NOTE — ED Provider Triage Note (Signed)
Emergency Medicine Provider Triage Evaluation Note  Donna Arias , a 88 y.o. female  was evaluated in triage.  Pt complains of vomiting.  Has been intermittent issue over the last few months however worse over the last few days.  Daughter mention her last few episodes of emesis have been dark and coffee-ground.  She takes baby aspirin daily however no chronic anti-inflammatories, no other anticoagulation.  She has not noted any grossly bloody stool however family unsure.  Patient has been having some epigastric abdominal pain.  Stools looser than normal. Hx of dementia  Review of Systems  Positive: Emesis, abd pain Negative: fever  Physical Exam  There were no vitals taken for this visit. Gen:   Awake, no distress   Resp:  Normal effort  MSK:   Moves extremities without difficulty  ABD:  Tenderness to epigastric region Other:    Medical Decision Making  Medically screening exam initiated at 7:33 PM.  Appropriate orders placed.  ANALIESE KRUPKA was informed that the remainder of the evaluation will be completed by another provider, this initial triage assessment does not replace that evaluation, and the importance of remaining in the ED until their evaluation is complete.  Emesis, abd pain   Othel Hoogendoorn A, PA-C 01/08/22 1934

## 2022-01-08 NOTE — ED Provider Notes (Signed)
[facility] DEPT Provider Note   CSN: [id number] Arrival date & time: 01/08/22  1911     History  Chief Complaint  Patient presents with   Nausea   Emesis    Donna Arias is a 88 y.o. female history of hypertension, here presenting with intermittent vomiting.  Patient has been having intermittent nausea and vomiting.  Donna Arias vomited today.  Donna Arias also had an episode of vomiting about a week ago.  Patient just recovered from pneumonia and finished a course of antibiotic.  Patient had negative urinalysis several weeks ago and has no urinary symptoms.  Patient is here with Donna Arias daughter who is giving most of the history.  The history is provided by the patient.       Home Medications Prior to Admission medications   Medication Sig Start Date End Date Taking? Authorizing Provider  albuterol (PROVENTIL) (2.5 MG/3ML) 0.083% nebulizer solution Take 3 mLs (2.5 mg total) by nebulization every 6 (six) hours as needed for wheezing or shortness of breath. 11/12/19   Lorrene Reid, PA-C  amLODipine (NORVASC) 2.5 MG tablet Take 1 tablet (2.5 mg total) by mouth daily. 12/07/21   Lorrene Reid, PA-C  amoxicillin-clavulanate (AUGMENTIN) 875-125 MG tablet Take 1 tablet by mouth 2 (two) times daily. 12/28/21   Parrett, Fonnie Mu, NP  aspirin 81 MG tablet Take 81 mg by mouth daily.    [provider]  atorvastatin (LIPITOR) 10 MG tablet Take 1 tablet (10 mg total) by mouth daily. 12/07/21   Lorrene Reid, PA-C  benzonatate (TESSALON) 100 MG capsule Take 1 capsule (100 mg total) by mouth 3 (three) times daily as needed for cough. 03/24/21   Abonza, Maritza, PA-C  budesonide (PULMICORT) 0.25 MG/2ML nebulizer solution Take 2 mLs (0.25 mg total) by nebulization in the morning and at bedtime. 04/20/21   Martyn Ehrich, NP  Calcium Carbonate-Vit D-Min (CALCIUM 1200 PO) Take 1 tablet by mouth.    [provider]  erythromycin ophthalmic ointment Place 1  application into the left eye at bedtime. 08/05/20   Lorrene Reid, PA-C  fexofenadine (ALLEGRA) 180 MG tablet Take 180 mg by mouth daily.    [provider]  FLUoxetine (PROZAC) 10 MG capsule TAKE 1 CAPSULE BY MOUTH DAILY. 08/18/21   Abonza, Maritza, PA-C  fluticasone (FLONASE) 50 MCG/ACT nasal spray Place 1 spray into both nostrils 2 (two) times daily. 10/12/18   Danford, Valetta Fuller D, NP  furosemide (LASIX) 20 MG tablet TAKE 1 TABLET BY MOUTH DAILY. 11/19/21   Abonza, Maritza, PA-C  ipratropium-albuterol (DUONEB) 0.5-2.5 (3) MG/3ML SOLN Take 3 mLs by nebulization every 4 (four) hours as needed. 12/25/21   Lynden Oxford Scales, PA-C  ketoconazole (NIZORAL) 2 % shampoo APPLY TOPICALLY 2 TIMES A WEEK. 12/07/21   Lorrene Reid, PA-C  levothyroxine (SYNTHROID) 25 MCG tablet TAKE 1/2 TABLET BY MOUTH DAILY WITH 50 MCG TO EQUAL 62.5 MCG TOTAL DAILY 12/07/21   Lorrene Reid, PA-C  levothyroxine (SYNTHROID) 50 MCG tablet TAKE 1 TABLET BY MOUTH EVERY DAY 10/21/21   Abonza, Maritza, PA-C  methylPREDNISolone (MEDROL DOSEPAK) 4 MG TBPK tablet Take 24 mg on day 1, 20 mg on day 2, 16 mg on day 3, 12 mg on day 4, 8 mg on day 5, 4 mg on day 6.  Take all tablets in each row at once, do not spread tablets out throughout the day. 12/25/21   Lynden Oxford Scales, PA-C  mupirocin ointment (BACTROBAN) 2 % APPLY TO AFFECTED AREA ON  THE SKIN 2 TIMES A DAY 08/20/21   Lorrene Reid, PA-C  mupirocin ointment (BACTROBAN) 2 % Apply 1 Application topically daily. With dressing changes 11/03/21   Ralene Bathe, MD  nystatin (MYCOSTATIN/NYSTOP) powder Apply 1 Application topically 2 (two) times daily. 12/07/21   Lorrene Reid, PA-C  potassium chloride SA (KLOR-CON M) 20 MEQ tablet Take 1 tablet (20 mEq total) by mouth daily. 12/07/21   Abonza, Herb Grays, PA-C  risperiDONE (RISPERDAL) 1 MG tablet TAKE 1 TABLET BY MOUTH 2 TIMES DAILY. 11/11/21   Lorrene Reid, PA-C  Spacer/Aero-Holding Chambers (AEROCHAMBER PLUS WITH  MASK) inhaler Use with Stiolto inhalations. 12/25/21   Lynden Oxford Scales, PA-C  STIOLTO RESPIMAT 2.5-2.5 MCG/ACT AERS INHALE 2 PUFFS INTO THE LUNGS DAILY. 11/19/21   Lorrene Reid, PA-C  triamcinolone cream (KENALOG) 0.1 % Apply 1 application topically 2 (two) times daily. Request jar 10/12/18   Danford, Valetta Fuller D, NP      Allergies    Picato [ingenol mebutate] and Codeine sulfate    Review of Systems   Review of Systems  Respiratory:  Positive for cough.   Gastrointestinal:  Positive for vomiting.  All other systems reviewed and are negative.   Physical Exam Updated Vital Signs BP (!) 158/91 (BP Location: Left Arm)   Pulse 96   Temp 97.6 F (36.4 C) (Oral)   Resp 16   Ht '5\' 4"'$  (1.626 m)   Wt 61.2 kg   SpO2 93%   BMI 23.17 kg/m  Physical Exam Vitals and nursing note reviewed.  Constitutional:      Appearance: Normal appearance.  HENT:     Head: Normocephalic.     Mouth/Throat:     Mouth: Mucous membranes are moist.  Eyes:     Extraocular Movements: Extraocular movements intact.     Pupils: Pupils are equal, round, and reactive to light.  Cardiovascular:     Rate and Rhythm: Normal rate and regular rhythm.     Pulses: Normal pulses.     Heart sounds: Normal heart sounds.  Pulmonary:     Effort: Pulmonary effort is normal.     Breath sounds: Normal breath sounds.  Abdominal:     General: Abdomen is flat.     Palpations: Abdomen is soft.     Comments: Mild epigastric tenderness  Musculoskeletal:        General: Normal range of motion.     Cervical back: Normal range of motion and neck supple.  Skin:    General: Skin is warm.     Capillary Refill: Capillary refill takes less than 2 seconds.  Neurological:     General: No focal deficit present.     Mental Status: Donna Arias is alert and oriented to person, place, and time.  Psychiatric:        Mood and Affect: Mood normal.        Behavior: Behavior normal.     ED Results / Procedures / Treatments   Labs (all  labs ordered are listed, but only abnormal results are displayed) Labs Reviewed  CBC WITH DIFFERENTIAL/PLATELET  COMPREHENSIVE METABOLIC PANEL  LIPASE, BLOOD  URINALYSIS, ROUTINE W REFLEX MICROSCOPIC  TROPONIN I (HIGH SENSITIVITY)    EKG EKG Interpretation  Date/Time:  Friday January 08 2022 20:38:49 EST Ventricular Rate:  93 PR Interval:    QRS Duration: 141 QT Interval:  396 QTC Calculation: 493 R Axis:   163 Text Interpretation: Atrial fibrillation Anterolateral infarct, acute (LAD) No significant change since last tracing Confirmed by Darl Householder,  Fenton Malling ([id number]) on 01/08/2022 9:01:35 PM  Radiology No results found.  Procedures Procedures    Medications Ordered in ED Medications  famotidine (PEPCID) IVPB 20 mg premix (20 mg Intravenous New Bag/Given 01/08/22 2051)  ondansetron (ZOFRAN) injection 4 mg (4 mg Intravenous Given 01/08/22 2049)  sodium chloride 0.9 % bolus 500 mL (500 mLs Intravenous New Bag/Given 01/08/22 2049)    ED Course/ Medical Decision Making/ A&P                           Medical Decision Making LANNETTE AVELLINO is a 88 y.o. female here presenting with vomiting.  Patient just finished course of antibiotics for pneumonia.  Patient has intermittent vomiting.  Consider ileus versus SBO versus posttussive vomiting.  Plan to get CBC and CMP and lipase and CT abdomen pelvis and chest x-ray.  11:29 PM I reviewed patient's labs.  White blood cell count is normal.  Initial troponin is 19.  Second troponin is pending.  Chest x-ray and CT abdomen pelvis is pending.  Signed out to Dr. Tyrone Nine in the ED.   Amount and/or Complexity of Data Reviewed Radiology: ordered. ECG/medicine tests: ordered.  Risk Prescription drug management.   Final Clinical Impression(s) / ED Diagnoses Final diagnoses:  None    Rx / DC Orders ED Discharge Orders     None         Drenda Freeze, MD 01/08/22 2329

## 2022-01-08 NOTE — ED Triage Notes (Signed)
Patient BIB EMS for evaluation of intermittent nausea and vomiting.  Has had diarrhea.  Pt lives at home with family.  Hx of dementia.

## 2022-01-09 ENCOUNTER — Emergency Department (HOSPITAL_COMMUNITY): Payer: Medicare HMO

## 2022-01-09 DIAGNOSIS — R109 Unspecified abdominal pain: Secondary | ICD-10-CM | POA: Diagnosis not present

## 2022-01-09 DIAGNOSIS — R059 Cough, unspecified: Secondary | ICD-10-CM | POA: Diagnosis not present

## 2022-01-09 DIAGNOSIS — J90 Pleural effusion, not elsewhere classified: Secondary | ICD-10-CM | POA: Diagnosis not present

## 2022-01-09 DIAGNOSIS — I700 Atherosclerosis of aorta: Secondary | ICD-10-CM | POA: Diagnosis not present

## 2022-01-09 DIAGNOSIS — J811 Chronic pulmonary edema: Secondary | ICD-10-CM | POA: Diagnosis not present

## 2022-01-09 LAB — URINALYSIS, ROUTINE W REFLEX MICROSCOPIC
Bacteria, UA: NONE SEEN
Bilirubin Urine: NEGATIVE
Glucose, UA: NEGATIVE mg/dL
Hgb urine dipstick: NEGATIVE
Ketones, ur: 5 mg/dL — AB
Nitrite: NEGATIVE
Protein, ur: 30 mg/dL — AB
Specific Gravity, Urine: 1.018 (ref 1.005–1.030)
pH: 5 (ref 5.0–8.0)

## 2022-01-09 MED ORDER — IOHEXOL 300 MG/ML  SOLN
75.0000 mL | Freq: Once | INTRAMUSCULAR | Status: AC | PRN
  Administered 2022-01-09: 75 mL via INTRAVENOUS

## 2022-01-09 NOTE — Discharge Instructions (Signed)
Follow up with your PCP and GI.

## 2022-01-09 NOTE — ED Provider Notes (Signed)
Received patient in turnover from Dr. Darl Householder.  Please see their note for further details of Hx, PE.  Briefly patient is a 88 y.o. female with a Nausea and Emesis .  Awaiting UA and CT scan of the abdomen pelvis and chest x-ray.  If negative plan to try to send home.Donna Arias

## 2022-01-11 ENCOUNTER — Ambulatory Visit: Payer: Medicare HMO | Admitting: Adult Health

## 2022-01-11 ENCOUNTER — Ambulatory Visit (INDEPENDENT_AMBULATORY_CARE_PROVIDER_SITE_OTHER): Payer: Medicare HMO

## 2022-01-11 ENCOUNTER — Encounter: Payer: Self-pay | Admitting: Adult Health

## 2022-01-11 VITALS — BP 112/76 | HR 84 | Temp 97.4°F | Ht 64.0 in | Wt 135.8 lb

## 2022-01-11 DIAGNOSIS — J441 Chronic obstructive pulmonary disease with (acute) exacerbation: Secondary | ICD-10-CM | POA: Diagnosis not present

## 2022-01-11 DIAGNOSIS — R112 Nausea with vomiting, unspecified: Secondary | ICD-10-CM

## 2022-01-11 DIAGNOSIS — I4891 Unspecified atrial fibrillation: Secondary | ICD-10-CM

## 2022-01-11 DIAGNOSIS — J811 Chronic pulmonary edema: Secondary | ICD-10-CM | POA: Diagnosis not present

## 2022-01-11 DIAGNOSIS — J90 Pleural effusion, not elsewhere classified: Secondary | ICD-10-CM | POA: Diagnosis not present

## 2022-01-11 DIAGNOSIS — I4819 Other persistent atrial fibrillation: Secondary | ICD-10-CM | POA: Insufficient documentation

## 2022-01-11 DIAGNOSIS — J449 Chronic obstructive pulmonary disease, unspecified: Secondary | ICD-10-CM | POA: Diagnosis not present

## 2022-01-11 MED ORDER — PANTOPRAZOLE SODIUM 40 MG PO TBEC: 40.0000 mg | DELAYED_RELEASE_TABLET | Freq: Every day | ORAL | 1 refills | Status: DC

## 2022-01-11 MED ORDER — ONDANSETRON HCL 4 MG PO TABS: 4.0000 mg | ORAL_TABLET | Freq: Two times a day (BID) | ORAL | 1 refills | Status: DC | PRN

## 2022-01-11 NOTE — Patient Instructions (Addendum)
Hold Lipitor for 2 weeks and then restart.  Begin Pantoprazole '40mg'$  daily  Begin Pepcid '20mg'$   At bedtime   Begin Liquid diet today only , Advance as tolerated.  Refer to GI . - N/V x 1 month  Refer to Cardiology - New onset A Fib .  Zofran '4mg'$  Twice daily  As needed   Gas X as needed.,  Fluids and rest.  Follow up with Dr. Chase Caller or Giancarlo Askren NP in 2-3 weeks with chest xray .  Please contact office for sooner follow up if symptoms do not improve or worsen or seek emergency care

## 2022-01-11 NOTE — Progress Notes (Signed)
@Patient  ID: Donna Arias, female    DOB: 1926/02/22, 88 y.o.   MRN: [id number]  Chief Complaint  Patient presents with   Follow-up    Referring provider: Peggye Fothergill  HPI: 88 year old female former smoker followed for COPD Medical history significant for chronic sinusitis and dementia, dysphagia  TEST/EVENTS :  High-resolution CT chest April 07, 2020 shows upper lobe predominant centrilobular nodularity with basilar subpleural groundglass, no honeycombing.  Findings suggestive of an alternative diagnosis not UIP  Barium swallow April 2022 negative for reflux or stricture.  Decreased esophageal motility  Swallow evaluation mild aspiration risk recommend D3 diet with mechanical soft solids and thin liquid  01/11/2022 Follow up : COPD  Patient returns for a 2-week follow-up.  Patient was seen last visit for COPD exacerbation plus or minus pneumonia.  Patient has been sick for around 3 weeks and was seen in the emergency room on December 1 treated for COPD exacerbation and started on a Medrol Dosepak.  Patient had had nausea vomiting along with cough and congestion. Patient is on Stiolto inhaler daily and Pulmicort nebulizer twice daily.  She had had multiple family members with similar symptoms. Patient was given Augmentin for possible pneumonia and COPD flare.  Chest x-ray had showed increased interstitial markings in the mid to lower lungs bilaterally.  And small bilateral pleural effusions.  Patient is accompanied by her daughters says that she did not get much better.  Unfortunately she continued to have intermittent nausea vomiting.  She went back to the emergency room on January 08, 2022.  She had had multiple episodes of nausea vomiting and was unable to keep any fluids or food down.  CT abdomen and pelvis showed no acute process.  Lab workup was essentially unrevealing, troponin was only mildly elevated.  Lipase was normal.  LFTs were normal.  Urinalysis was unrevealing.   And normal CBC.  Patient was given Zofran and IV fluids.  Since emergency room visit.  She continues to have some intermittent episodes of nausea vomiting.  Patient's daughter says it has gotten slightly better as she has been separating her medications.  And only given her liquids and bland food.  Emergency room notes do show patient had atrial fibs.  Patient's daughter does not remember previous diagnosis of this.  Today in the office EKG shows rate controlled A-fib.  Patient denies any chest pain.  Patient is on aspirin 81 mg. She denies any bloody stools, abdominal pain, back pain.  Patient does have some dementia but is able to answer questions.  Patient is accompanied by her daughter. Cough and congestion have improved main issue is her ongoing nausea vomiting.  Chest x-ray today shows stable small bilateral pleural effusion.      Allergies  Allergen Reactions   Picato [Ingenol Mebutate] Hives   Codeine Sulfate Other (See Comments)    REACTION: unspecified    Immunization History  Administered Date(s) Administered   Fluad Quad(high Dose 65+) 11/06/2018, 12/11/2019, 11/07/2020, 12/07/2021   H1N1 02/26/2008   Influenza Split 12/02/2010, 10/07/2011   Influenza Whole 11/17/2006, 10/27/2007, 12/01/2009   Influenza, High Dose Seasonal PF 10/15/2014, 11/10/2015, 10/25/2016, 10/27/2017   Influenza,inj,Quad PF,6+ Mos 11/10/2012, 09/18/2013   PFIZER(Purple Top)SARS-COV-2 Vaccination 01/30/2019, 04/01/2019, 05/01/2019   Pneumococcal Conjugate-13 04/30/2013   Pneumococcal Polysaccharide-23 01/26/2003   Td 01/25/1997, 02/26/2008   Zoster Recombinat (Shingrix) 11/30/2019, 01/31/2020   Zoster, Live 04/08/2008    Past Medical History:  Diagnosis Date   Chronic headaches    COPD (  chronic obstructive pulmonary disease) (HCC)    Dementia (HCC)    Eye infection, left 01/16/2018   GERD (gastroesophageal reflux disease)    Heart attack (HCC) 1998   HLD (hyperlipidemia)    Hypertension     Skin cancer    squamous cell of left face   Squamous cell carcinoma of skin 11/03/2021   L zygoma - poorly differentiated   Stroke (HCC)    Thyroid disease     Tobacco History: Social History   Tobacco Use  Smoking Status Former   Types: Cigarettes   Quit date: 1995   Years since quitting: 28.9  Smokeless Tobacco Never   Counseling given: Not Answered   Outpatient Medications Prior to Visit  Medication Sig Dispense Refill   albuterol (PROVENTIL) (2.5 MG/3ML) 0.083% nebulizer solution Take 3 mLs (2.5 mg total) by nebulization every 6 (six) hours as needed for wheezing or shortness of breath. 75 mL 6   amLODipine (NORVASC) 2.5 MG tablet Take 1 tablet (2.5 mg total) by mouth daily. 90 tablet 1   aspirin 81 MG tablet Take 81 mg by mouth every evening.     atorvastatin (LIPITOR) 10 MG tablet Take 1 tablet (10 mg total) by mouth daily. 90 tablet 1   benzonatate (TESSALON) 100 MG capsule Take 1 capsule (100 mg total) by mouth 3 (three) times daily as needed for cough. 30 capsule 1   budesonide (PULMICORT) 0.25 MG/2ML nebulizer solution Take 2 mLs (0.25 mg total) by nebulization in the morning and at bedtime. 120 mL 11   Calcium Carbonate-Vit D-Min (CALCIUM 1200 PO) Take 1 tablet by mouth daily.     Cranberry 500 MG TABS Take 2 tablets by mouth 3 (three) times a week.     dextromethorphan-guaiFENesin (MUCINEX DM) 30-600 MG 12hr tablet Take 1 tablet by mouth 2 (two) times daily as needed for cough.     erythromycin ophthalmic ointment Place 1 application into the left eye at bedtime. (Patient taking differently: Place 1 application  into the left eye at bedtime as needed (dry eyes).) 3.5 g 2   fexofenadine (ALLEGRA) 180 MG tablet Take 180 mg by mouth daily as needed for allergies.     FLUoxetine (PROZAC) 10 MG capsule TAKE 1 CAPSULE BY MOUTH DAILY. 90 capsule 1   fluticasone (FLONASE) 50 MCG/ACT nasal spray Place 1 spray into both nostrils 2 (two) times daily. (Patient taking differently:  Place 1 spray into both nostrils 2 (two) times daily as needed for allergies.) 16 g 6   furosemide (LASIX) 20 MG tablet TAKE 1 TABLET BY MOUTH DAILY. 90 tablet 0   ipratropium-albuterol (DUONEB) 0.5-2.5 (3) MG/3ML SOLN Take 3 mLs by nebulization every 4 (four) hours as needed. (Patient taking differently: Take 3 mLs by nebulization every 6 (six) hours as needed (sob/wheezing).) 360 mL 1   ketoconazole (NIZORAL) 2 % shampoo APPLY TOPICALLY 2 TIMES A WEEK. (Patient taking differently: Apply 1 Application topically once a week.) 120 mL 1   levothyroxine (SYNTHROID) 25 MCG tablet TAKE 1/2 TABLET BY MOUTH DAILY WITH 50 MCG TO EQUAL 62.5 MCG TOTAL DAILY 45 tablet 0   levothyroxine (SYNTHROID) 50 MCG tablet TAKE 1 TABLET BY MOUTH EVERY DAY 90 tablet 1   loratadine (CLARITIN) 10 MG tablet Take 10 mg by mouth daily.     methylPREDNISolone (MEDROL DOSEPAK) 4 MG TBPK tablet Take 24 mg on day 1, 20 mg on day 2, 16 mg on day 3, 12 mg on day 4, 8 mg on  day 5, 4 mg on day 6.  Take all tablets in each row at once, do not spread tablets out throughout the day. 21 tablet 0   mupirocin ointment (BACTROBAN) 2 % APPLY TO AFFECTED AREA ON THE SKIN 2 TIMES A DAY (Patient taking differently: Apply 1 Application topically 2 (two) times daily as needed (skin irritation).) 22 g 0   mupirocin ointment (BACTROBAN) 2 % Apply 1 Application topically daily. With dressing changes (Patient taking differently: Apply 1 Application topically daily as needed (rash). With dressing changes) 22 g 0   nystatin (MYCOSTATIN/NYSTOP) powder Apply 1 Application topically 2 (two) times daily. 60 g 2   Polyvinyl Alcohol-Povidone PF (REFRESH) 1.4-0.6 % SOLN Apply 1 drop to eye 2 (two) times daily as needed (dry eyes).     potassium chloride SA (KLOR-CON M) 20 MEQ tablet Take 1 tablet (20 mEq total) by mouth daily. 90 tablet 1   risperiDONE (RISPERDAL) 1 MG tablet TAKE 1 TABLET BY MOUTH 2 TIMES DAILY. 180 tablet 1   Spacer/Aero-Holding Chambers  (AEROCHAMBER PLUS WITH MASK) inhaler Use with Stiolto inhalations. 1 each 0   STIOLTO RESPIMAT 2.5-2.5 MCG/ACT AERS INHALE 2 PUFFS INTO THE LUNGS DAILY. 4 g 2   triamcinolone cream (KENALOG) 0.1 % Apply 1 application topically 2 (two) times daily. Request jar 453 g 0   amoxicillin-clavulanate (AUGMENTIN) 875-125 MG tablet Take 1 tablet by mouth 2 (two) times daily. (Patient not taking: Reported on 01/09/2022) 14 tablet 0   No facility-administered medications prior to visit.     Review of Systems:   Constitutional:   No  weight loss, night sweats,  Fevers, chills,  +fatigue, or  lassitude.  HEENT:   No headaches,  Difficulty swallowing,  Tooth/dental problems, or  Sore throat,                No sneezing, itching, ear ache, nasal congestion, post nasal drip,   CV:  No chest pain,  Orthopnea, PND, swelling in lower extremities, anasarca, dizziness, palpitations, syncope.   GI  No heartburn, indigestion, abdominal pain, nausea, vomiting, diarrhea, change in bowel habits, loss of appetite, bloody stools.   Resp: .  No chest wall deformity  Skin: no rash or lesions.  GU: no dysuria, change in color of urine, no urgency or frequency.  No flank pain, no hematuria   MS:  No joint pain or swelling.  No decreased range of motion.  No back pain.    Physical Exam  BP 112/76 (BP Location: Left Arm, Patient Position: Sitting, Cuff Size: Normal)   Pulse 84   Temp (!) 97.4 F (36.3 C) (Temporal)   Ht 5\' 4"  (1.626 m)   Wt 135 lb 12.8 oz (61.6 kg)   SpO2 97%   BMI 23.31 kg/m   GEN: A/Ox3; pleasant , NAD, well nourished    HEENT:  [place]/AT,  , NOSE-clear, THROAT-clear, no lesions, no postnasal drip or exudate noted.   NECK:  Supple w/ fair ROM; no JVD; normal carotid impulses w/o bruits; no thyromegaly or nodules palpated; no lymphadenopathy.    RESP  Clear  P & A; w/o, wheezes/ rales/ or rhonchi. no accessory muscle use, no dullness to percussion  CARD:  Irreg/irreg  no m/r/g, tr   peripheral edema, pulses intact, no cyanosis or clubbing.  GI:   Soft & nt; nml bowel sounds; no organomegaly or masses detected.   Musco: Warm bil, no deformities or joint swelling noted.   Neuro: alert, no focal deficits  noted.    Skin: Warm, no lesions or rashes    Lab Results:  CBC    Component Value Date/Time   WBC 9.4 01/08/2022 2048   RBC 4.21 01/08/2022 2048   HGB 13.2 01/08/2022 2048   HGB 14.5 12/28/2021 0903   HCT 41.0 01/08/2022 2048   HCT 43.1 12/28/2021 0903   PLT 172 01/08/2022 2048   PLT 280 12/28/2021 0903   MCV 97.4 01/08/2022 2048   MCV 91 12/28/2021 0903   MCH 31.4 01/08/2022 2048   MCHC 32.2 01/08/2022 2048   RDW 15.5 01/08/2022 2048   RDW 12.4 12/28/2021 0903   LYMPHSABS 0.3 (L) 01/08/2022 2048   LYMPHSABS 2.2 12/28/2021 0903   MONOABS 0.2 01/08/2022 2048   EOSABS 0.0 01/08/2022 2048   EOSABS 0.1 12/28/2021 0903   BASOSABS 0.0 01/08/2022 2048   BASOSABS 0.1 12/28/2021 0903    BMET    Component Value Date/Time   NA 136 01/08/2022 2048   NA 138 12/28/2021 0903   K 4.6 01/08/2022 2048   CL 104 01/08/2022 2048   CO2 22 01/08/2022 2048   GLUCOSE 110 (H) 01/08/2022 2048   BUN 14 01/08/2022 2048   BUN 18 12/28/2021 0903   CREATININE 1.03 (H) 01/08/2022 2048   CALCIUM 9.3 01/08/2022 2048   GFRNONAA 51 (L) 01/08/2022 2048   GFRAA 49 (L) 02/18/2020 0844    BNP No results found for: "BNP"  ProBNP No results found for: "PROBNP"  Imaging: DG Chest 2 View  Result Date: 01/11/2022 CLINICAL DATA:  Evaluate for pneumonia. EXAM: CHEST - 2 VIEW COMPARISON:  01/09/2022 FINDINGS: Stable cardiomediastinal contours. Small left pleural effusion is unchanged from previous exam. Tiny right pleural effusion is also stable. Pulmonary vascular congestion. No frank edema or airspace disease. IMPRESSION: 1. Stable small left and tiny right pleural effusions. 2. Pulmonary vascular congestion. Electronically Signed   By: Signa Kell M.D.   On: 01/11/2022  10:05   DG Chest 2 View  Result Date: 01/09/2022 CLINICAL DATA:  Cough EXAM: CHEST - 2 VIEW COMPARISON:  Chest x-ray 12/28/2021 FINDINGS: There is a small left pleural effusion which has increased. Central pulmonary vascular congestion persists. The heart is enlarged, unchanged. No pneumothorax or acute fracture. IMPRESSION: 1. Increased small left pleural effusion. 2. Cardiomegaly with central pulmonary vascular congestion. Electronically Signed   By: Darliss Cheney M.D.   On: 01/09/2022 00:58   CT ABDOMEN PELVIS W CONTRAST  Result Date: 01/09/2022 CLINICAL DATA:  Epigastric pain and diarrhea EXAM: CT ABDOMEN AND PELVIS WITH CONTRAST TECHNIQUE: Multidetector CT imaging of the abdomen and pelvis was performed using the standard protocol following bolus administration of intravenous contrast. RADIATION DOSE REDUCTION: This exam was performed according to the departmental dose-optimization program which includes automated exposure control, adjustment of the mA and/or kV according to patient size and/or use of iterative reconstruction technique. CONTRAST:  75mL OMNIPAQUE IOHEXOL 300 MG/ML  SOLN COMPARISON:  None Available. FINDINGS: Lower Chest: Medium-sized pleural effusions with basilar atelectasis. Hepatobiliary: Normal hepatic contours. No intra- or extrahepatic biliary dilatation. Status post cholecystectomy. Pancreas: Normal pancreas. No ductal dilatation or peripancreatic fluid collection. Spleen: Normal. Adrenals/Urinary Tract: The adrenal glands are normal. No hydronephrosis, nephroureterolithiasis or solid renal mass. The urinary bladder is normal for degree of distention Stomach/Bowel: There is no hiatal hernia. Normal duodenal course and caliber. No small bowel dilatation or inflammation. No focal colonic abnormality. Normal appendix. Vascular/Lymphatic: There is calcific atherosclerosis of the abdominal aorta. No lymphadenopathy. Reproductive: Normal uterus. No  adnexal mass. Other: None.  Musculoskeletal: No bony spinal canal stenosis or focal osseous abnormality. IMPRESSION: 1. No acute abnormality of the abdomen or pelvis. 2. Medium-sized pleural effusions with basilar atelectasis. Aortic Atherosclerosis (ICD10-I70.0). Electronically Signed   By: Deatra Robinson M.D.   On: 01/09/2022 00:49   OCT, Retina - OU - Both Eyes  Result Date: 01/04/2022 Right Eye Quality was good. Scan locations included subfoveal. Central Foveal Thickness: 272. Progression has improved. Findings include normal foveal contour, no IRF, no SRF, retinal drusen , subretinal hyper-reflective material, intraretinal hyper-reflective material, epiretinal membrane, pigment epithelial detachment, outer retinal atrophy (Interval improvement in central cystic changes nasal fovea). Left Eye Quality was good. Scan locations included nasal. Central Foveal Thickness: 193. Progression has improved. Findings include no IRF, no SRF, abnormal foveal contour, retinal drusen , subretinal hyper-reflective material, inner retinal atrophy, outer retinal atrophy (Diffuse retinal atrophy / ischemia, central SRHM / scar with surrounding cystic changes - improved). Notes *Images captured and stored on drive Diagnosis / Impression: OD: ex ARMD -- Interval improvement in central cystic changes nasal fovea OS: Diffuse retinal atrophy / ischemia, central SRHM / scar with surrounding cystic changes - improved Clinical management: See below Abbreviations: NFP - Normal foveal profile. CME - cystoid macular edema. PED - pigment epithelial detachment. IRF - intraretinal fluid. SRF - subretinal fluid. EZ - ellipsoid zone. ERM - epiretinal membrane. ORA - outer retinal atrophy. ORT - outer retinal tubulation. SRHM - subretinal hyper-reflective material. IRHM - intraretinal hyper-reflective material   Intravitreal Injection, Pharmacologic Agent - OD - Right Eye  Result Date: 01/04/2022 Time Out 01/04/2022. 1:54 PM. Confirmed correct patient, procedure,  site, and patient consented. Anesthesia Topical anesthesia was used. Anesthetic medications included Lidocaine 2%, Proparacaine 0.5%. Procedure Preparation included 5% betadine to ocular surface, eyelid speculum. A (32g) needle was used. Injection: 1.25 mg Bevacizumab 1.25mg /0.27ml   Route: Intravitreal, Site: Right Eye   NDC: [id number], Lot: [id number], Expiration date: 01/10/2022 Post-op Post injection exam found visual acuity of at least counting fingers. The patient tolerated the procedure well. There were no complications. The patient received written and verbal post procedure care education.   DG Chest 2 View  Result Date: 12/28/2021 CLINICAL DATA:  Vomiting, COPD EXAM: CHEST - 2 VIEW COMPARISON:  None Available. FINDINGS: Transverse diameter of heart is increased. Central pulmonary vessels are prominent. There is interval increase in interstitial markings in parahilar regions and lower lung fields. Small bilateral pleural effusions are seen. IMPRESSION: Cardiomegaly. Increased interstitial markings are seen in mid and lower lung fields on both sides suggesting interstitial pulmonary edema or interstitial pneumonia. Small bilateral pleural effusions are seen. Electronically Signed   By: Ernie Avena M.D.   On: 12/28/2021 16:01    Bevacizumab (AVASTIN) SOLN 1.25 mg     Date Action Dose Route User   Discharged on 01/09/2022   Admitted on 01/08/2022   Discharged on 12/25/2021   Admitted on 12/25/2021   11/16/2021 1621 Given 1.25 mg Intravitreal (Right Eye) Rennis Chris, MD      Bevacizumab (AVASTIN) SOLN 1.25 mg     Date Action Dose Route User   Discharged on 01/09/2022   Admitted on 01/08/2022   01/04/2022 1410 Given 1.25 mg Intravitreal (Right Eye) Rennis Chris, MD           No data to display          No results found for: "NITRICOXIDE"      Assessment & Plan:   COPD (chronic obstructive pulmonary  disease) (HCC) Recent COPD exacerbation appears to be improving.   Chest x-ray does show small bilateral pleural effusions.  May have had a recent pneumonia.  Has completed course of antibiotics and steroids.  Hold on additional for this time.  Patient is on Lasix.  Would hold on additional diuresis with her underlying nausea and vomiting and to avoid dehydration.  Plan  Patient Instructions  Hold Lipitor for 2 weeks and then restart.  Begin Pantoprazole 40mg  daily  Begin Pepcid 20mg   At bedtime   Begin Liquid diet today only , Advance as tolerated.  Refer to GI . - N/V x 1 month  Refer to Cardiology - New onset A Fib .  Zofran 4mg  Twice daily  As needed   Gas X as needed.,  Fluids and rest.  Follow up with Dr. Marchelle Gearing or Jadi Deyarmin NP in 2-3 weeks with chest xray .  Please contact office for sooner follow up if symptoms do not improve or worsen or seek emergency care       N&V (nausea and vomiting) Nausea and vomiting x 6 weeks.  Questionable etiology.  Lab work with LFTs and lipase unrevealing.  Previous cholecystectomy.  CT abdomen and pelvis during the emergency room visit unremarkable.  Will need referral to GI.  Will give prescription for Zofran as needed.  Advance bland diet as tolerated.  Begin PPI and H2 blocker.  May try Gas-X. If symptoms do not improve or worsen she will need to go back to the emergency room Patient is on multiple medications.  Will hold statin for now as trying to limit her polypharmacy with her ongoing nausea vomiting.  Plan  Patient Instructions  Hold Lipitor for 2 weeks and then restart.  Begin Pantoprazole 40mg  daily  Begin Pepcid 20mg   At bedtime   Begin Liquid diet today only , Advance as tolerated.  Refer to GI . - N/V x 1 month  Refer to Cardiology - New onset A Fib .  Zofran 4mg  Twice daily  As needed   Gas X as needed.,  Fluids and rest.  Follow up with Dr. Marchelle Gearing or Timiyah Romito NP in 2-3 weeks with chest xray .  Please contact office for sooner follow up if symptoms do not improve or worsen or seek  emergency care        Atrial fibrillation [facility]) Atrial fibrillation noted on EKG during the emergency room visit.  Repeat EKG today shows A-fib.  Patient's rate is controlled.  Blood pressure is stable.  Recent TSH was normal.  Patient remains on calcium channel blocker and baby aspirin.  Will hold on anticoagulation therapy at this time with patient's age multiple comorbidities and ongoing nausea vomiting.  Will refer to cardiology/  Advise if patient develops chest pain increased edema or worsening underlying condition she will need to go to the emergency  room.  Discussed case and EKG with Dr. Marchelle Gearing   Plan  Patient Instructions  Hold Lipitor for 2 weeks and then restart.  Begin Pantoprazole 40mg  daily  Begin Pepcid 20mg   At bedtime   Begin Liquid diet today only , Advance as tolerated.  Refer to GI . - N/V x 1 month  Refer to Cardiology - New onset A Fib .  Zofran 4mg  Twice daily  As needed   Gas X as needed.,  Fluids and rest.  Follow up with Dr. Marchelle Gearing or Skyy Mcknight NP in 2-3 weeks with chest xray .  Please contact office for sooner follow up if symptoms  do not improve or worsen or seek emergency care         Rubye Oaks, NP 01/11/2022

## 2022-01-11 NOTE — Assessment & Plan Note (Addendum)
Atrial fibrillation noted on EKG during the emergency room visit.  Repeat EKG today shows A-fib.  Patient's rate is controlled.  Blood pressure is stable.  Recent TSH was normal.  Patient remains on calcium channel blocker and baby aspirin.  Will hold on anticoagulation therapy at this time with patient's age multiple comorbidities and ongoing nausea vomiting.  Will refer to cardiology/  Advise if patient develops chest pain increased edema or worsening underlying condition she will need to go to the emergency  room.  Discussed case and EKG with Dr. Chase Caller   Plan  Patient Instructions  Hold Lipitor for 2 weeks and then restart.  Begin Pantoprazole '40mg'$  daily  Begin Pepcid '20mg'$   At bedtime   Begin Liquid diet today only , Advance as tolerated.  Refer to GI . - N/V x 1 month  Refer to Cardiology - New onset A Fib .  Zofran '4mg'$  Twice daily  As needed   Gas X as needed.,  Fluids and rest.  Follow up with Dr. Chase Caller or Debie Ashline NP in 2-3 weeks with chest xray .  Please contact office for sooner follow up if symptoms do not improve or worsen or seek emergency care

## 2022-01-11 NOTE — Assessment & Plan Note (Addendum)
Nausea and vomiting x 6 weeks.  Questionable etiology.  Lab work with LFTs and lipase unrevealing.  Previous cholecystectomy.  CT abdomen and pelvis during the emergency room visit unremarkable.  Will need referral to GI.  Will give prescription for Zofran as needed.  Advance bland diet as tolerated.  Begin PPI and H2 blocker.  May try Gas-X. If symptoms do not improve or worsen she will need to go back to the emergency room Patient is on multiple medications.  Will hold statin for now as trying to limit her polypharmacy with her ongoing nausea vomiting.  Plan  Patient Instructions  Hold Lipitor for 2 weeks and then restart.  Begin Pantoprazole '40mg'$  daily  Begin Pepcid '20mg'$   At bedtime   Begin Liquid diet today only , Advance as tolerated.  Refer to GI . - N/V x 1 month  Refer to Cardiology - New onset A Fib .  Zofran '4mg'$  Twice daily  As needed   Gas X as needed.,  Fluids and rest.  Follow up with Dr. Chase Caller or Natascha Edmonds NP in 2-3 weeks with chest xray .  Please contact office for sooner follow up if symptoms do not improve or worsen or seek emergency care

## 2022-01-11 NOTE — Assessment & Plan Note (Signed)
Recent COPD exacerbation appears to be improving.  Chest x-ray does show small bilateral pleural effusions.  May have had a recent pneumonia.  Has completed course of antibiotics and steroids.  Hold on additional for this time.  Patient is on Lasix.  Would hold on additional diuresis with her underlying nausea and vomiting and to avoid dehydration.  Plan  Patient Instructions  Hold Lipitor for 2 weeks and then restart.  Begin Pantoprazole '40mg'$  daily  Begin Pepcid '20mg'$   At bedtime   Begin Liquid diet today only , Advance as tolerated.  Refer to GI . - N/V x 1 month  Refer to Cardiology - New onset A Fib .  Zofran '4mg'$  Twice daily  As needed   Gas X as needed.,  Fluids and rest.  Follow up with Dr. Chase Caller or Jonaya Freshour NP in 2-3 weeks with chest xray .  Please contact office for sooner follow up if symptoms do not improve or worsen or seek emergency care

## 2022-01-13 ENCOUNTER — Encounter: Payer: Self-pay | Admitting: Cardiovascular Disease

## 2022-01-13 ENCOUNTER — Ambulatory Visit: Payer: Medicare HMO | Attending: Cardiovascular Disease | Admitting: Cardiovascular Disease

## 2022-01-13 VITALS — BP 118/66 | HR 84 | Ht 64.0 in | Wt 134.0 lb

## 2022-01-13 DIAGNOSIS — I480 Paroxysmal atrial fibrillation: Secondary | ICD-10-CM

## 2022-01-13 DIAGNOSIS — E782 Mixed hyperlipidemia: Secondary | ICD-10-CM | POA: Diagnosis not present

## 2022-01-13 NOTE — Assessment & Plan Note (Signed)
Donna Arias was referred to me by Rexene Edison, NP for newly recognized A-fib.  Her last EKG in the medical record was in 2017 when she was in sinus rhythm.  She was just found to be in A-fib during her recent office visit 01/11/2022.  She is unaware of this.  She is fairly frail has mild dementia and is prone to falling.  She lives with her only daughter [name]. This patients CHA2DS2-VASc Score and unadjusted Ischemic Stroke Rate (% per year) is equal to 4.8 % stroke rate/year from a score of 4.  I think because of all the above factors she is a poor candidate for oral anticoagulation.  Above score calculated as 1 point each if present [CHF, HTN, DM, Vascular=MI/PAD/Aortic Plaque, Age if 65-74, or Female] Above score calculated as 2 points each if present [Age > 75, or Stroke/TIA/TE]

## 2022-01-13 NOTE — Assessment & Plan Note (Signed)
History of hyperlipidemia on statin therapy lipid profile performed 12//23 revealing total cholesterol 173, LDL 98 HDL of 60.

## 2022-01-13 NOTE — Progress Notes (Signed)
01/13/2022 Donna Arias   [birth year]  [id number]  Primary Physician Lorrene Reid, PA-C Primary Cardiologist: Lorretta Harp MD Lupe Carney, [place]  HPI:  Donna Arias is a 88 y.o. 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  She was referred by Rexene Edison, NP for newly recognized A-fib.  Her problems include treated hypertension and hyperlipidemia.  She does have mild dementia.  She smoked remotely but does have COPD.  She had a chest CT performed 04/07/2020 that did show coronary calcification.  She is chronically short of breath but denies chest pain.  She is minimally ambulatory and for the most part there is from chair to bed.  She is somewhat unsteady on her feet.   Current Meds  Medication Sig   albuterol (PROVENTIL) (2.5 MG/3ML) 0.083% nebulizer solution Take 3 mLs (2.5 mg total) by nebulization every 6 (six) hours as needed for wheezing or shortness of breath.   amLODipine (NORVASC) 2.5 MG tablet Take 1 tablet (2.5 mg total) by mouth daily.   aspirin 81 MG tablet Take 81 mg by mouth every evening.   benzonatate (TESSALON) 100 MG capsule Take 1 capsule (100 mg total) by mouth 3 (three) times daily as needed for cough.   budesonide (PULMICORT) 0.25 MG/2ML nebulizer solution Take 2 mLs (0.25 mg total) by nebulization in the morning and at bedtime.   Calcium Carbonate-Vit D-Min (CALCIUM 1200 PO) Take 1 tablet by mouth daily.   Cranberry 500 MG TABS Take 2 tablets by mouth 3 (three) times a week.   dextromethorphan-guaiFENesin (MUCINEX DM) 30-600 MG 12hr tablet Take 1 tablet by mouth 2 (two) times daily as needed for cough.   erythromycin ophthalmic ointment Place 1 application into the left eye at bedtime. (Patient taking differently: Place 1 application  into the left eye at bedtime as needed (dry eyes).)   fexofenadine (ALLEGRA) 180 MG tablet Take 180 mg by mouth  daily as needed for allergies.   FLUoxetine (PROZAC) 10 MG capsule TAKE 1 CAPSULE BY MOUTH DAILY.   fluticasone (FLONASE) 50 MCG/ACT nasal spray Place 1 spray into both nostrils 2 (two) times daily. (Patient taking differently: Place 1 spray into both nostrils 2 (two) times daily as needed for allergies.)   furosemide (LASIX) 20 MG tablet TAKE 1 TABLET BY MOUTH DAILY.   ipratropium-albuterol (DUONEB) 0.5-2.5 (3) MG/3ML SOLN Take 3 mLs by nebulization every 4 (four) hours as needed. (Patient taking differently: Take 3 mLs by nebulization every 6 (six) hours as needed (sob/wheezing).)   ketoconazole (NIZORAL) 2 % shampoo APPLY TOPICALLY 2 TIMES A WEEK. (Patient taking differently: Apply 1 Application topically once a week.)   levothyroxine (SYNTHROID) 25 MCG tablet TAKE 1/2 TABLET BY MOUTH DAILY WITH 50 MCG TO EQUAL 62.5 MCG TOTAL DAILY   levothyroxine (SYNTHROID) 50 MCG tablet TAKE 1 TABLET BY MOUTH EVERY DAY   loratadine (CLARITIN) 10 MG tablet Take 10 mg by mouth daily.   mupirocin ointment (BACTROBAN) 2 % APPLY TO AFFECTED AREA ON THE SKIN 2 TIMES A DAY (Patient taking differently: Apply 1 Application topically 2 (two) times daily as needed (skin irritation).)   mupirocin ointment (BACTROBAN) 2 % Apply 1 Application topically daily. With dressing changes (Patient taking differently: Apply 1 Application topically daily as needed (rash). With dressing changes)   nystatin (MYCOSTATIN/NYSTOP) powder Apply 1 Application topically 2 (two) times daily.   ondansetron (  ZOFRAN) 4 MG tablet Take 1 tablet (4 mg total) by mouth 2 (two) times daily as needed for nausea or vomiting.   pantoprazole (PROTONIX) 40 MG tablet Take 1 tablet (40 mg total) by mouth daily.   Polyvinyl Alcohol-Povidone PF (REFRESH) 1.4-0.6 % SOLN Apply 1 drop to eye 2 (two) times daily as needed (dry eyes).   potassium chloride SA (KLOR-CON M) 20 MEQ tablet Take 1 tablet (20 mEq total) by mouth daily.   risperiDONE (RISPERDAL) 1 MG tablet  TAKE 1 TABLET BY MOUTH 2 TIMES DAILY.   Spacer/Aero-Holding Chambers (AEROCHAMBER PLUS WITH MASK) inhaler Use with Stiolto inhalations.   STIOLTO RESPIMAT 2.5-2.5 MCG/ACT AERS INHALE 2 PUFFS INTO THE LUNGS DAILY.   triamcinolone cream (KENALOG) 0.1 % Apply 1 application topically 2 (two) times daily. Request jar     Allergies  Allergen Reactions   Picato [Ingenol Mebutate] Hives   Codeine Sulfate Other (See Comments)    REACTION: unspecified    Social History   Socioeconomic History   Marital status: Widowed    Spouse name: Not on file   Number of children: 1   Years of education: Not on file   Highest education level: Not on file  Occupational History   Occupation: RETIRED  Tobacco Use   Smoking status: Former    Types: Cigarettes    Quit date: 1995    Years since quitting: 28.9   Smokeless tobacco: Never  Vaping Use   Vaping Use: Never used  Substance and Sexual Activity   Alcohol use: No   Drug use: No   Sexual activity: Never    Birth control/protection: None  Other Topics Concern   Not on file  Social History Narrative   CB x 1   Lives at home with her daughter (since 02/05/2013)   Right handed   Regular exercise - NO   Social Determinants of Health   Financial Resource Strain: Not on file  Food Insecurity: Not on file  Transportation Needs: Not on file  Physical Activity: Not on file  Stress: Not on file  Social Connections: Not on file  Intimate Partner Violence: Not on file     Review of Systems: General: negative for chills, fever, night sweats or weight changes.  Cardiovascular: negative for chest pain, dyspnea on exertion, edema, orthopnea, palpitations, paroxysmal nocturnal dyspnea or shortness of breath Dermatological: negative for rash Respiratory: negative for cough or wheezing Urologic: negative for hematuria Abdominal: negative for nausea, vomiting, diarrhea, bright red blood per rectum, melena, or hematemesis Neurologic: negative for  visual changes, syncope, or dizziness All other systems reviewed and are otherwise negative except as noted above.    Blood pressure 118/66, pulse 84, height '5\' 4"'$  (1.626 m), weight 134 lb (60.8 kg).  General appearance: alert and no distress Neck: no adenopathy, no carotid bruit, no JVD, supple, symmetrical, trachea midline, and thyroid not enlarged, symmetric, no tenderness/mass/nodules Lungs: clear to auscultation bilaterally Heart: irregularly irregular rhythm Extremities: extremities normal, atraumatic, no cyanosis or edema Pulses: 2+ and symmetric Skin: Skin color, texture, turgor normal. No rashes or lesions Neurologic: Grossly normal  EKG atrial fibrillation with a ventricular sponsor of 84 left bundle branch block.  I personally reviewed this EKG.  ASSESSMENT AND PLAN:   Persistent atrial fibrillation ([place]) Ms. Donovan was referred to me by Rexene Edison, NP for newly recognized A-fib.  Her last EKG in the medical record was in 2017 when she was in sinus rhythm.  She was just found to  be in A-fib during her recent office visit 01/11/2022.  She is unaware of this.  She is fairly frail has mild dementia and is prone to falling.  She lives with her only daughter [name]. This patients CHA2DS2-VASc Score and unadjusted Ischemic Stroke Rate (% per year) is equal to 4.8 % stroke rate/year from a score of 4.  I think because of all the above factors she is a poor candidate for oral anticoagulation.  Above score calculated as 1 point each if present [CHF, HTN, DM, Vascular=MI/PAD/Aortic Plaque, Age if 65-74, or Female] Above score calculated as 2 points each if present [Age > 75, or Stroke/TIA/TE]   Essential hypertension History of essential hypertension a blood pressure measured today at 118/66.  She is on amlodipine.  Hyperlipidemia History of hyperlipidemia on statin therapy lipid profile performed 12//23 revealing total cholesterol 173, LDL 98 HDL of 60.     Lorretta Harp MD  FACP,FACC,FAHA, [facility] 01/13/2022 1:58 PM

## 2022-01-13 NOTE — Patient Instructions (Signed)
Medication Instructions:  Your physician recommends that you continue on your current medications as directed. Please refer to the Current Medication list given to you today.  *If you need a refill on your cardiac medications before your next appointment, please call your pharmacy*   Follow-Up: At [facility], you and your health needs are our priority.  As part of our continuing mission to provide you with exceptional heart care, we have created designated Provider Care Teams.  These Care Teams include your primary Cardiologist (physician) and Advanced Practice Providers (APPs -  Physician Assistants and Nurse Practitioners) who all work together to provide you with the care you need, when you need it.  We recommend signing up for the patient portal called "MyChart".  Sign up information is provided on this After Visit Summary.  MyChart is used to connect with patients for Virtual Visits (Telemedicine).  Patients are able to view lab/test results, encounter notes, upcoming appointments, etc.  Non-urgent messages can be sent to your provider as well.   To learn more about what you can do with MyChart, go to [email or web address].    Your next appointment:   We will see you on an as needed basis.  Provider:   Jonathan Berry, MD  

## 2022-01-13 NOTE — Assessment & Plan Note (Signed)
History of essential hypertension a blood pressure measured today at 118/66.  She is on amlodipine.

## 2022-01-14 ENCOUNTER — Telehealth: Payer: Self-pay | Admitting: Adult Health

## 2022-01-14 ENCOUNTER — Telehealth: Payer: Self-pay

## 2022-01-14 MED ORDER — ONDANSETRON HCL 4 MG PO TABS: 4.0000 mg | ORAL_TABLET | Freq: Two times a day (BID) | ORAL | 1 refills | Status: DC | PRN

## 2022-01-14 NOTE — Telephone Encounter (Signed)
Called [facility] back this evening and Judson Roch states that patients daughter called asked for zofran. Looks like it was sent 01/11/2022 but they state they do not have it on file. Resent rx refill for zofran. Nothing further needed

## 2022-01-14 NOTE — Telephone Encounter (Signed)
     Patient  visit on 12/16  at [facility]   Have you been able to follow up with your primary care physician? Yes   The patient was or was not able to obtain any needed medicine or equipment. Yes   Are there diet recommendations that you are having difficulty following? Na   Patient expresses understanding of discharge instructions and education provided has no other needs at this time.  Yes      [place], [facility], Care Management  [phone] [address], [place], [place] [address] Phone: [phone] Email: [email or web address]

## 2022-01-20 ENCOUNTER — Telehealth: Payer: Self-pay | Admitting: Gastroenterology

## 2022-01-20 NOTE — Telephone Encounter (Signed)
Spoke with pt's daughter and pt's daughter stated that pt was told by pulmonologist to stop miralax due to nausea and vomiting and now pt is constipated. The pt has tried taking a stool softener but it didn't seem to help. Pt then tried a suppository on Sunday and was able to have a bowel movement. Let pt's daughter know that pt can continue to use the suppositories to help with constipation until pt's follow up appointment on 1/17. Daughter wanted to know how many times per week the patient should use the suppositories. Let pt's daughter know that pt can use the suppositories 3 times a week and can continue taking the stool softener daily as well.

## 2022-01-20 NOTE — Telephone Encounter (Signed)
Patient's daughter is calling seeking advise on something she can give her mother to help her have a bowel movement since she was taken off Miralax. Please advise

## 2022-02-01 ENCOUNTER — Ambulatory Visit (INDEPENDENT_AMBULATORY_CARE_PROVIDER_SITE_OTHER): Payer: Medicare HMO

## 2022-02-01 ENCOUNTER — Ambulatory Visit: Payer: Medicare HMO | Admitting: Adult Health

## 2022-02-01 ENCOUNTER — Encounter: Payer: Self-pay | Admitting: Adult Health

## 2022-02-01 VITALS — BP 104/60 | HR 75 | Temp 98.2°F | Ht 64.0 in | Wt 127.2 lb

## 2022-02-01 DIAGNOSIS — J90 Pleural effusion, not elsewhere classified: Secondary | ICD-10-CM | POA: Diagnosis not present

## 2022-02-01 DIAGNOSIS — R1310 Dysphagia, unspecified: Secondary | ICD-10-CM | POA: Diagnosis not present

## 2022-02-01 DIAGNOSIS — R112 Nausea with vomiting, unspecified: Secondary | ICD-10-CM | POA: Diagnosis not present

## 2022-02-01 DIAGNOSIS — J441 Chronic obstructive pulmonary disease with (acute) exacerbation: Secondary | ICD-10-CM

## 2022-02-01 NOTE — Assessment & Plan Note (Signed)
Continue with aspiration precautions.  

## 2022-02-01 NOTE — Assessment & Plan Note (Addendum)
Recent flare now resolved-possibly initial pneumonia.  Check chest x-ray today. Maintained on present regimen  Plan  Patient Instructions  Continue Pantoprazole '40mg'$  daily  Continue on Pepcid '20mg'$   At bedtime   Bland diet , advance as tolerated.  Follow up with GI as planned next week.  Zofran '4mg'$  Twice daily  As needed   Gas X as needed.,  Pulmicort Neb Twice daily   Duoneb Three times a day   Albuterol inhaler As needed   Chest xray today .  Aspiration precautions Follow up with Donna Arias in 4 months and As needed   Please contact office for sooner follow up if symptoms do not improve or worsen or seek emergency care    '

## 2022-02-01 NOTE — Patient Instructions (Addendum)
Continue Pantoprazole '40mg'$  daily  Continue on Pepcid '20mg'$   At bedtime   Bland diet , advance as tolerated.  Follow up with GI as planned next week.  Zofran '4mg'$  Twice daily  As needed   Gas X as needed.,  Pulmicort Neb Twice daily   Duoneb Three times a day   Albuterol inhaler As needed   Chest xray today .  Aspiration precautions Follow up with Dr. Chase Caller in 4 months and As needed   Please contact office for sooner follow up if symptoms do not improve or worsen or seek emergency care

## 2022-02-01 NOTE — Assessment & Plan Note (Signed)
Nausea vomiting has resolved.  Questionable etiology possibly on underlying gastroenteritis plus or minus medication intolerances Seems to be improving.  Continue on current regimen and follow-up with GI at next week as planned  Plan  Patient Instructions  Continue Pantoprazole '40mg'$  daily  Continue on Pepcid '20mg'$   At bedtime   Bland diet , advance as tolerated.  Follow up with GI as planned next week.  Zofran '4mg'$  Twice daily  As needed   Gas X as needed.,  Pulmicort Neb Twice daily   Duoneb Three times a day   Albuterol inhaler As needed   Chest xray today .  Aspiration precautions Follow up with Dr. Chase Caller in 4 months and As needed   Please contact office for sooner follow up if symptoms do not improve or worsen or seek emergency care

## 2022-02-01 NOTE — Progress Notes (Signed)
$'@Patient'X$  ID: Donna Arias, female    DOB: 03-12-1926, 89 y.o.   MRN: [id number]  Chief Complaint  Patient presents with   Follow-up    Referring provider: Ellouise Newer  HPI: 89 year old female former smoker followed for COPD Medical history significant for chronic sinusitis, dementia and dysphagia  TEST/EVENTS :  High-resolution CT chest April 07, 2020 shows upper lobe predominant centrilobular nodularity with basilar subpleural groundglass, no honeycombing.  Findings suggestive of an alternative diagnosis not UIP   Barium swallow April 2022 negative for reflux or stricture.  Decreased esophageal motility   Swallow evaluation mild aspiration risk recommend D3 diet with mechanical soft solids and thin liquid  02/01/2022 Follow up : COPD , Gastroenteritis  Patient presents for a 2-week follow-up.  Patient was seen last visit for a slow to resolve COPD exacerbation plus or minus pneumonia.  She had been sick and was treated in the emergency room December 1 for COPD exacerbation.  Started on prednisone.  Patient had associated nausea vomiting along with cough and congestion.  There was full multiple family members in the home with similar symptoms.  She was treated with Augmentin for possible underlying pneumonia.  Chest x-ray had showed increased interstitial markings in the mid to lower lungs bilaterally and a small bilateral pleural effusions.  She also developed new onset A-fib.  Patient continued to have ongoing nausea vomiting and anorexia.  Last visit she was started on Protonix and Pepcid.  Advance on a clear liquid diet.  Refer to gastroenterology.  Patient's stomach symptoms have improved substantially.  Nausea vomiting has resolved.  She is starting to advance her diet.  Currently on full diet but more bland foods.  Has some residual gas but symptoms are much improved.  She has an appointment with gastroenterology next week. She was also sent to cardiology.  Patient was felt  high risk for anticoagulation therapy.  Rate is controlled with A-fib.  Continued on current regimen. Says overall breathing is doing okay.  She remains on Pulmicort nebulizer twice daily.  And DuoNeb nebulizer 3 times daily.  She is accompanied by her daughter who is her caregiver.  Says that she is working hard on aspiration precautions. Denies any fever, hemoptysis, chest pain or edema.  Allergies  Allergen Reactions   Picato [Ingenol Mebutate] Hives   Codeine Sulfate Other (See Comments)    REACTION: unspecified    Immunization History  Administered Date(s) Administered   Fluad Quad(high Dose 65+) 11/06/2018, 12/11/2019, 11/07/2020, 12/07/2021   H1N1 02/26/2008   Influenza Split 12/02/2010, 10/07/2011   Influenza Whole 11/17/2006, 10/27/2007, 12/01/2009   Influenza, High Dose Seasonal PF 10/15/2014, 11/10/2015, 10/25/2016, 10/27/2017   Influenza,inj,Quad PF,6+ Mos 11/10/2012, 09/18/2013   PFIZER(Purple Top)SARS-COV-2 Vaccination 01/30/2019, 04/01/2019, 05/01/2019   Pneumococcal Conjugate-13 04/30/2013   Pneumococcal Polysaccharide-23 01/26/2003   Td 01/25/1997, 02/26/2008   Zoster Recombinat (Shingrix) 11/30/2019, 01/31/2020   Zoster, Live 04/08/2008    Past Medical History:  Diagnosis Date   Chronic headaches    COPD (chronic obstructive pulmonary disease) ([place])    Dementia ([place])    Eye infection, left 01/16/2018   GERD (gastroesophageal reflux disease)    Heart attack ([place]) 1998   HLD (hyperlipidemia)    Hypertension    Skin cancer    squamous cell of left face   Squamous cell carcinoma of skin 11/03/2021   L zygoma - poorly differentiated   Stroke ([place])    Thyroid disease     Tobacco History: Social History  Tobacco Use  Smoking Status Former   Types: Cigarettes   Quit date: 1995   Years since quitting: 29.0  Smokeless Tobacco Never   Counseling given: Not Answered   Outpatient Medications Prior to Visit  Medication Sig Dispense Refill   albuterol  (PROVENTIL) (2.5 MG/3ML) 0.083% nebulizer solution Take 3 mLs (2.5 mg total) by nebulization every 6 (six) hours as needed for wheezing or shortness of breath. 75 mL 6   amLODipine (NORVASC) 2.5 MG tablet Take 1 tablet (2.5 mg total) by mouth daily. 90 tablet 1   aspirin 81 MG tablet Take 81 mg by mouth every evening.     atorvastatin (LIPITOR) 10 MG tablet Take 1 tablet (10 mg total) by mouth daily. 90 tablet 1   benzonatate (TESSALON) 100 MG capsule Take 1 capsule (100 mg total) by mouth 3 (three) times daily as needed for cough. 30 capsule 1   budesonide (PULMICORT) 0.25 MG/2ML nebulizer solution Take 2 mLs (0.25 mg total) by nebulization in the morning and at bedtime. 120 mL 11   Calcium Carbonate-Vit D-Min (CALCIUM 1200 PO) Take 1 tablet by mouth daily.     Cranberry 500 MG TABS Take 2 tablets by mouth 3 (three) times a week.     dextromethorphan-guaiFENesin (MUCINEX DM) 30-600 MG 12hr tablet Take 1 tablet by mouth 2 (two) times daily as needed for cough.     erythromycin ophthalmic ointment Place 1 application into the left eye at bedtime. (Patient taking differently: Place 1 application  into the left eye at bedtime as needed (dry eyes).) 3.5 g 2   fexofenadine (ALLEGRA) 180 MG tablet Take 180 mg by mouth daily as needed for allergies.     FLUoxetine (PROZAC) 10 MG capsule TAKE 1 CAPSULE BY MOUTH DAILY. 90 capsule 1   fluticasone (FLONASE) 50 MCG/ACT nasal spray Place 1 spray into both nostrils 2 (two) times daily. (Patient taking differently: Place 1 spray into both nostrils 2 (two) times daily as needed for allergies.) 16 g 6   furosemide (LASIX) 20 MG tablet TAKE 1 TABLET BY MOUTH DAILY. 90 tablet 0   ipratropium-albuterol (DUONEB) 0.5-2.5 (3) MG/3ML SOLN Take 3 mLs by nebulization every 4 (four) hours as needed. (Patient taking differently: Take 3 mLs by nebulization every 6 (six) hours as needed (sob/wheezing).) 360 mL 1   ketoconazole (NIZORAL) 2 % shampoo APPLY TOPICALLY 2 TIMES A WEEK.  (Patient taking differently: Apply 1 Application topically once a week.) 120 mL 1   levothyroxine (SYNTHROID) 25 MCG tablet TAKE 1/2 TABLET BY MOUTH DAILY WITH 50 MCG TO EQUAL 62.5 MCG TOTAL DAILY 45 tablet 0   levothyroxine (SYNTHROID) 50 MCG tablet TAKE 1 TABLET BY MOUTH EVERY DAY 90 tablet 1   loratadine (CLARITIN) 10 MG tablet Take 10 mg by mouth daily.     mupirocin ointment (BACTROBAN) 2 % APPLY TO AFFECTED AREA ON THE SKIN 2 TIMES A DAY (Patient taking differently: Apply 1 Application topically 2 (two) times daily as needed (skin irritation).) 22 g 0   mupirocin ointment (BACTROBAN) 2 % Apply 1 Application topically daily. With dressing changes (Patient taking differently: Apply 1 Application topically daily as needed (rash). With dressing changes) 22 g 0   nystatin (MYCOSTATIN/NYSTOP) powder Apply 1 Application topically 2 (two) times daily. 60 g 2   ondansetron (ZOFRAN) 4 MG tablet Take 1 tablet (4 mg total) by mouth 2 (two) times daily as needed for nausea or vomiting. 20 tablet 1   pantoprazole (PROTONIX) 40 MG  tablet Take 1 tablet (40 mg total) by mouth daily. 30 tablet 1   Polyvinyl Alcohol-Povidone PF (REFRESH) 1.4-0.6 % SOLN Apply 1 drop to eye 2 (two) times daily as needed (dry eyes).     potassium chloride SA (KLOR-CON M) 20 MEQ tablet Take 1 tablet (20 mEq total) by mouth daily. 90 tablet 1   risperiDONE (RISPERDAL) 1 MG tablet TAKE 1 TABLET BY MOUTH 2 TIMES DAILY. 180 tablet 1   Spacer/Aero-Holding Chambers (AEROCHAMBER PLUS WITH MASK) inhaler Use with Stiolto inhalations. 1 each 0   STIOLTO RESPIMAT 2.5-2.5 MCG/ACT AERS INHALE 2 PUFFS INTO THE LUNGS DAILY. 4 g 2   triamcinolone cream (KENALOG) 0.1 % Apply 1 application topically 2 (two) times daily. Request jar 453 g 0   No facility-administered medications prior to visit.     Review of Systems:   Constitutional:   No  weight loss, night sweats,  Fevers, chills, fatigue, or  lassitude.  HEENT:   No headaches,  Difficulty  swallowing,  Tooth/dental problems, or  Sore throat,                No sneezing, itching, ear ache, nasal congestion, post nasal drip,   CV:  No chest pain,  Orthopnea, PND, swelling in lower extremities, anasarca, dizziness, palpitations, syncope.   GI  No heartburn, indigestion, abdominal pain, nausea, vomiting, diarrhea, change in bowel habits, loss of appetite, bloody stools.   Resp: No shortness of breath with exertion or at rest.  No excess mucus, no productive cough,  No non-productive cough,  No coughing up of blood.  No change in color of mucus.  No wheezing.  No chest wall deformity  Skin: no rash or lesions.  GU: no dysuria, change in color of urine, no urgency or frequency.  No flank pain, no hematuria   MS:  No joint pain or swelling.  No decreased range of motion.  No back pain.    Physical Exam  BP 104/60 (BP Location: Right Arm, Patient Position: Sitting, Cuff Size: Normal)   Pulse 75   Temp 98.2 F (36.8 C) (Oral)   Ht '5\' 4"'$  (1.626 m)   Wt 127 lb 3.2 oz (57.7 kg)   SpO2 94%   BMI 21.83 kg/m   GEN: A/Ox3; pleasant , NAD, frail and elderly in wc    HEENT:  [place]/AT,   NOSE-clear, THROAT-clear, no lesions, no postnasal drip or exudate noted.   NECK:  Supple w/ fair ROM; no JVD; normal carotid impulses w/o bruits; no thyromegaly or nodules palpated; no lymphadenopathy.    RESP  Clear  P & A; w/o, wheezes/ rales/ or rhonchi. no accessory muscle use, no dullness to percussion  CARD:  RRR, no m/r/g, no peripheral edema, pulses intact, no cyanosis or clubbing.  GI:   Soft & nt; nml bowel sounds; no organomegaly or masses detected.  No guarding   Musco: Warm bil, no deformities or joint swelling noted.   Neuro: alert, no focal deficits noted.    Skin: Warm, no lesions or rashes    Lab Results:  CBC  BNP No results found for: "BNP"  ProBNP No results found for: "PROBNP"  Imaging: DG Chest 2 View  Result Date: 02/01/2022 CLINICAL DATA:  Pleural  effusion EXAM: CHEST - 2 VIEW COMPARISON:  01/11/2022 FINDINGS: Hyperinflation. Calcified aorta. Stable cardiopericardial silhouette persistent small left effusion mild adjacent opacity. No pneumothorax or edema. Scattered degenerative changes and osteopenia of the skeleton. Surgical clips in the right upper  quadrant of the abdomen. IMPRESSION: Hyperinflation with persistent small left effusion adjacent opacity. Electronically Signed   By: Jill Side M.D.   On: 02/01/2022 15:37   DG Chest 2 View  Result Date: 01/11/2022 CLINICAL DATA:  Evaluate for pneumonia. EXAM: CHEST - 2 VIEW COMPARISON:  01/09/2022 FINDINGS: Stable cardiomediastinal contours. Small left pleural effusion is unchanged from previous exam. Tiny right pleural effusion is also stable. Pulmonary vascular congestion. No frank edema or airspace disease. IMPRESSION: 1. Stable small left and tiny right pleural effusions. 2. Pulmonary vascular congestion. Electronically Signed   By: Kerby Moors M.D.   On: 01/11/2022 10:05   DG Chest 2 View  Result Date: 01/09/2022 CLINICAL DATA:  Cough EXAM: CHEST - 2 VIEW COMPARISON:  Chest x-ray 12/28/2021 FINDINGS: There is a small left pleural effusion which has increased. Central pulmonary vascular congestion persists. The heart is enlarged, unchanged. No pneumothorax or acute fracture. IMPRESSION: 1. Increased small left pleural effusion. 2. Cardiomegaly with central pulmonary vascular congestion. Electronically Signed   By: Ronney Asters M.D.   On: 01/09/2022 00:58   CT ABDOMEN PELVIS W CONTRAST  Result Date: 01/09/2022 CLINICAL DATA:  Epigastric pain and diarrhea EXAM: CT ABDOMEN AND PELVIS WITH CONTRAST TECHNIQUE: Multidetector CT imaging of the abdomen and pelvis was performed using the standard protocol following bolus administration of intravenous contrast. RADIATION DOSE REDUCTION: This exam was performed according to the departmental dose-optimization program which includes automated  exposure control, adjustment of the mA and/or kV according to patient size and/or use of iterative reconstruction technique. CONTRAST:  66m OMNIPAQUE IOHEXOL 300 MG/ML  SOLN COMPARISON:  None Available. FINDINGS: Lower Chest: Medium-sized pleural effusions with basilar atelectasis. Hepatobiliary: Normal hepatic contours. No intra- or extrahepatic biliary dilatation. Status post cholecystectomy. Pancreas: Normal pancreas. No ductal dilatation or peripancreatic fluid collection. Spleen: Normal. Adrenals/Urinary Tract: The adrenal glands are normal. No hydronephrosis, nephroureterolithiasis or solid renal mass. The urinary bladder is normal for degree of distention Stomach/Bowel: There is no hiatal hernia. Normal duodenal course and caliber. No small bowel dilatation or inflammation. No focal colonic abnormality. Normal appendix. Vascular/Lymphatic: There is calcific atherosclerosis of the abdominal aorta. No lymphadenopathy. Reproductive: Normal uterus. No adnexal mass. Other: None. Musculoskeletal: No bony spinal canal stenosis or focal osseous abnormality. IMPRESSION: 1. No acute abnormality of the abdomen or pelvis. 2. Medium-sized pleural effusions with basilar atelectasis. Aortic Atherosclerosis (ICD10-I70.0). Electronically Signed   By: KUlyses JarredM.D.   On: 01/09/2022 00:49   OCT, Retina - OU - Both Eyes  Result Date: 01/04/2022 Right Eye Quality was good. Scan locations included subfoveal. Central Foveal Thickness: 272. Progression has improved. Findings include normal foveal contour, no IRF, no SRF, retinal drusen , subretinal hyper-reflective material, intraretinal hyper-reflective material, epiretinal membrane, pigment epithelial detachment, outer retinal atrophy (Interval improvement in central cystic changes nasal fovea). Left Eye Quality was good. Scan locations included nasal. Central Foveal Thickness: 193. Progression has improved. Findings include no IRF, no SRF, abnormal foveal contour,  retinal drusen , subretinal hyper-reflective material, inner retinal atrophy, outer retinal atrophy (Diffuse retinal atrophy / ischemia, central SRHM / scar with surrounding cystic changes - improved). Notes *Images captured and stored on drive Diagnosis / Impression: OD: ex ARMD -- Interval improvement in central cystic changes nasal fovea OS: Diffuse retinal atrophy / ischemia, central SRHM / scar with surrounding cystic changes - improved Clinical management: See below Abbreviations: NFP - Normal foveal profile. CME - cystoid macular edema. PED - pigment epithelial detachment. IRF - intraretinal  fluid. SRF - subretinal fluid. EZ - ellipsoid zone. ERM - epiretinal membrane. ORA - outer retinal atrophy. ORT - outer retinal tubulation. SRHM - subretinal hyper-reflective material. IRHM - intraretinal hyper-reflective material   Intravitreal Injection, Pharmacologic Agent - OD - Right Eye  Result Date: 01/04/2022 Time Out 01/04/2022. 1:54 PM. Confirmed correct patient, procedure, site, and patient consented. Anesthesia Topical anesthesia was used. Anesthetic medications included Lidocaine 2%, Proparacaine 0.5%. Procedure Preparation included 5% betadine to ocular surface, eyelid speculum. A (32g) needle was used. Injection: 1.25 mg Bevacizumab 1.'25mg'$ /0.39m   Route: Intravitreal, Site: Right Eye   NDC: [id number] Lot:: [id number] Expiration date: 01/10/2022 Post-op Post injection exam found visual acuity of at least counting fingers. The patient tolerated the procedure well. There were no complications. The patient received written and verbal post procedure care education.    Bevacizumab (AVASTIN) SOLN 1.25 mg     Date Action Dose Route User   Discharged on 01/09/2022   Admitted on 01/08/2022   01/04/2022 1410 Given 1.25 mg Intravitreal (Right Eye) ZBernarda Caffey MD           No data to display          No results found for: "NITRICOXIDE"      Assessment & Plan:   N&V (nausea and  vomiting) Nausea vomiting has resolved.  Questionable etiology possibly on underlying gastroenteritis plus or minus medication intolerances Seems to be improving.  Continue on current regimen and follow-up with GI at next week as planned  Plan  Patient Instructions  Continue Pantoprazole '40mg'$  daily  Continue on Pepcid '20mg'$   At bedtime   Bland diet , advance as tolerated.  Follow up with GI as planned next week.  Zofran '4mg'$  Twice daily  As needed   Gas X as needed.,  Pulmicort Neb Twice daily   Duoneb Three times a day   Albuterol inhaler As needed   Chest xray today .  Aspiration precautions Follow up with Dr. RChase Callerin 4 months and As needed   Please contact office for sooner follow up if symptoms do not improve or worsen or seek emergency care       [place] Recent flare now resolved-possibly initial pneumonia.  Check chest x-ray today. Maintained on present regimen  Plan  Patient Instructions  Continue Pantoprazole '40mg'$  daily  Continue on Pepcid '20mg'$   At bedtime   Bland diet , advance as tolerated.  Follow up with GI as planned next week.  Zofran '4mg'$  Twice daily  As needed   Gas X as needed.,  Pulmicort Neb Twice daily   Duoneb Three times a day   Albuterol inhaler As needed   Chest xray today .  Aspiration precautions Follow up with Dr. RChase Callerin 4 months and As needed   Please contact office for sooner follow up if symptoms do not improve or worsen or seek emergency care    '   Dysphagia Continue with aspiration precautions     TRexene Edison NP 02/01/2022

## 2022-02-02 NOTE — Progress Notes (Signed)
ATC patient's daughter (DPR), LVM to return call.

## 2022-02-03 ENCOUNTER — Telehealth: Payer: Self-pay | Admitting: Adult Health

## 2022-02-03 NOTE — Telephone Encounter (Signed)
PT ret call from [facility] regarding her mother's recent rads I saw no open tel encounters. Please call daughter at number on file.

## 2022-02-03 NOTE — Telephone Encounter (Signed)
Called and spoke to patients daughter and went over chest xray results. Nothing further needed

## 2022-02-08 NOTE — Progress Notes (Signed)
Called and spoke to patients daughter and went over chest xray results. Nothing further needed

## 2022-02-10 ENCOUNTER — Encounter: Payer: Self-pay | Admitting: Gastroenterology

## 2022-02-10 ENCOUNTER — Ambulatory Visit: Payer: Medicare HMO | Admitting: Gastroenterology

## 2022-02-10 VITALS — BP 126/78 | HR 98 | Ht 64.0 in | Wt 125.0 lb

## 2022-02-10 DIAGNOSIS — R110 Nausea: Secondary | ICD-10-CM | POA: Insufficient documentation

## 2022-02-10 DIAGNOSIS — K5900 Constipation, unspecified: Secondary | ICD-10-CM

## 2022-02-10 MED ORDER — ONDANSETRON HCL 4 MG PO TABS: 2.0000 mg | ORAL_TABLET | Freq: Two times a day (BID) | ORAL | 1 refills | Status: DC | PRN

## 2022-02-10 NOTE — Progress Notes (Signed)
02/10/2022 Donna Arias [id number] [birth year]   HISTORY OF PRESENT ILLNESS: This is a 89 year old female who is a patient of Dr. Vena Rua, but really only seen by me back in 2022 for some issues with dysphagia that we evaluated with a modified barium swallow study.  She is here today with her daughter for follow-up of a hospital stay in December.  Her daughter tells me that she was having some issues where she was vomiting on and off.  In December she had some worsening of the nausea and vomiting.  She was having a lot of pulmonary/breathing issues at that time as well with a lot of mucus and drainage.  She was hospitalized and all of that was addressed.  In the hospital CT scan of the abdomen and pelvis with contrast was unremarkable for cause of her nausea and vomiting.  She was started on pantoprazole 40 mg daily and given Zofran to use twice daily as needed.  Her MiraLAX was discontinued.  She was using that and doing well with that for constipation, but they thought may be that was worsening some of her nausea.  Now she has been doing well in regards to the nausea and vomiting.  She continues on the pantoprazole 40 mg daily and has been using the Zofran 4 mg twice daily.  Has had no issues.  Daughter has been limiting her diet, but she has done well with everything and is becoming stronger.  She denies any abdominal pain.  She is struggling with her constipation again, however, and using 4 Colace daily has not really been doing the trick.   Past Medical History:  Diagnosis Date   Chronic headaches    COPD (chronic obstructive pulmonary disease) ([place])    Dementia ([place])    Eye infection, left 01/16/2018   GERD (gastroesophageal reflux disease)    Heart attack ([place]) 1998   HLD (hyperlipidemia)    Hypertension    Skin cancer    squamous cell of left face   Squamous cell carcinoma of skin 11/03/2021   L zygoma - poorly differentiated   Stroke ([place])    Thyroid disease    Past Surgical  History:  Procedure Laterality Date   BREAST LUMPECTOMY Left [place]    reports that she quit smoking about 29 years ago. Her smoking use included cigarettes. She has never used smokeless tobacco. She reports that she does not drink alcohol and does not use drugs. family history includes Alzheimer's disease in her sister; Breast cancer in her sister; Hypertension in her mother and sister; Stroke in her mother, sister, sister, and sister. Allergies  Allergen Reactions   Picato [Ingenol Mebutate] Hives   Codeine Sulfate Other (See Comments)    REACTION: unspecified      Outpatient Encounter Medications as of 02/10/2022  Medication Sig   albuterol (PROVENTIL) (2.5 MG/3ML) 0.083% nebulizer solution Take 3 mLs (2.5 mg total) by nebulization every 6 (six) hours as needed for wheezing or shortness of breath.   amLODipine (NORVASC) 2.5 MG tablet Take 1 tablet (2.5 mg total) by mouth daily.   aspirin 81 MG tablet Take 81 mg by mouth every evening.   atorvastatin (LIPITOR) 10 MG tablet Take 1 tablet (10 mg total) by mouth daily.   benzonatate (TESSALON) 100 MG capsule Take 1 capsule (100 mg total) by mouth 3 (three) times daily as needed for cough.  budesonide (PULMICORT) 0.25 MG/2ML nebulizer solution Take 2 mLs (0.25 mg total) by nebulization in the morning and at bedtime.   Calcium Carbonate-Vit D-Min (CALCIUM 1200 PO) Take 1 tablet by mouth daily.   Cranberry 500 MG TABS Take 2 tablets by mouth 3 (three) times a week.   dextromethorphan-guaiFENesin (MUCINEX DM) 30-600 MG 12hr tablet Take 1 tablet by mouth 2 (two) times daily as needed for cough.   erythromycin ophthalmic ointment Place 1 application into the left eye at bedtime. (Patient taking differently: Place 1 application  into the left eye at bedtime as needed (dry eyes).)   fexofenadine (ALLEGRA) 180 MG tablet Take 180 mg by mouth daily as needed for allergies.    FLUoxetine (PROZAC) 10 MG capsule TAKE 1 CAPSULE BY MOUTH DAILY.   fluticasone (FLONASE) 50 MCG/ACT nasal spray Place 1 spray into both nostrils 2 (two) times daily. (Patient taking differently: Place 1 spray into both nostrils 2 (two) times daily as needed for allergies.)   furosemide (LASIX) 20 MG tablet TAKE 1 TABLET BY MOUTH DAILY.   ipratropium-albuterol (DUONEB) 0.5-2.5 (3) MG/3ML SOLN Take 3 mLs by nebulization every 4 (four) hours as needed. (Patient taking differently: Take 3 mLs by nebulization every 6 (six) hours as needed (sob/wheezing).)   ketoconazole (NIZORAL) 2 % shampoo APPLY TOPICALLY 2 TIMES A WEEK. (Patient taking differently: Apply 1 Application topically once a week.)   levothyroxine (SYNTHROID) 25 MCG tablet TAKE 1/2 TABLET BY MOUTH DAILY WITH 50 MCG TO EQUAL 62.5 MCG TOTAL DAILY   levothyroxine (SYNTHROID) 50 MCG tablet TAKE 1 TABLET BY MOUTH EVERY DAY   mupirocin ointment (BACTROBAN) 2 % APPLY TO AFFECTED AREA ON THE SKIN 2 TIMES A DAY (Patient taking differently: Apply 1 Application topically 2 (two) times daily as needed (skin irritation).)   mupirocin ointment (BACTROBAN) 2 % Apply 1 Application topically daily. With dressing changes (Patient taking differently: Apply 1 Application topically daily as needed (rash). With dressing changes)   nystatin (MYCOSTATIN/NYSTOP) powder Apply 1 Application topically 2 (two) times daily.   ondansetron (ZOFRAN) 4 MG tablet Take 1 tablet (4 mg total) by mouth 2 (two) times daily as needed for nausea or vomiting.   pantoprazole (PROTONIX) 40 MG tablet Take 1 tablet (40 mg total) by mouth daily.   Polyvinyl Alcohol-Povidone PF (REFRESH) 1.4-0.6 % SOLN Apply 1 drop to eye 2 (two) times daily as needed (dry eyes).   potassium chloride SA (KLOR-CON M) 20 MEQ tablet Take 1 tablet (20 mEq total) by mouth daily.   risperiDONE (RISPERDAL) 1 MG tablet TAKE 1 TABLET BY MOUTH 2 TIMES DAILY.   Spacer/Aero-Holding Chambers (AEROCHAMBER PLUS WITH MASK)  inhaler Use with Stiolto inhalations.   STIOLTO RESPIMAT 2.5-2.5 MCG/ACT AERS INHALE 2 PUFFS INTO THE LUNGS DAILY.   triamcinolone cream (KENALOG) 0.1 % Apply 1 application topically 2 (two) times daily. Request jar   loratadine (CLARITIN) 10 MG tablet Take 10 mg by mouth daily. (Patient not taking: Reported on 02/10/2022)   No facility-administered encounter medications on file as of 02/10/2022.     REVIEW OF SYSTEMS  : All other systems reviewed and negative except where noted in the History of Present Illness.   PHYSICAL EXAM: BP 126/78   Pulse 98   Ht '5\' 4"'$  (1.626 m)   Wt 125 lb (56.7 kg)   SpO2 97%   BMI 21.46 kg/m  General: Elderly white female in no acute distress; in wheelchair Head: Normocephalic and atraumatic Eyes:  Sclerae anicteric, conjunctiva pink.  Ears: Normal auditory acuity  Lungs: Clear throughout to auscultation; no W/R/R. Heart: Regular rate and rhythm; no M/R/G. Abdomen: Soft, non-distended.  BS present.  Non-tender. Musculoskeletal: Symmetrical with no gross deformities  Skin: No lesions on visible extremities Extremities: No edema  Neurological: Alert oriented x 4, grossly non-focal Psychological:  Alert and cooperative. Normal mood and affect  ASSESSMENT AND PLAN: *Nausea and vomiting:  ? Source.  CT scan of the abdomen and pelvis unremarkable.  Was placed on pantoprazole 40 mg daily and Zofran which she has been using twice a day.  Has had no further issues in over month.  At that time she was also having a lot of pulmonary/lung issues with mucus and drainage, etc.  Will try to decrease her Zofran use.  Advised the daughter to try to cut the pills in half and just use 2 mg twice daily for a while to see how she does.  Can slowly introduce other foods into her diet. *Constipation: Had done well on MiraLAX for a long time.  That was stopped during the time she was having issues with nausea and vomiting.  Several Colace a day do not really seem to be helping.   Okay to restart her MiraLAX at this point.  **Her daughter will update [name] via MyChart in a couple of weeks.   CC:  Lorrene Reid, PA-C

## 2022-02-10 NOTE — Patient Instructions (Addendum)
______________________________________________________  If you are age 87 or older, your body mass index should be between 23-30. Your Body mass index is 21.46 kg/m. If this is out of the aforementioned range listed, please consider follow up with your Primary Care Provider. ________________________________________________________  The [facility] providers would like to encourage you to use [facility] to communicate with providers for non-urgent requests or questions.  Due to long hold times on the telephone, sending your provider a message by [facility] may be a faster and more efficient way to get a response.  Please allow 48 business hours for a response.  Please remember that this is for non-urgent requests.  _______________________________________________________  Please purchase the following medications over the counter and take as directed:  START: Miralax one capful in 8 ounces of water daily.  DECREASE: Zofran to 1/2 tablet in the morning and in the evening.   Please call our office or send MyChart message with update in 2 weeks.   Thank you for entrusting me with your care and choosing [facility].  Alonza Bogus, PA-C

## 2022-02-12 NOTE — Progress Notes (Signed)
Addendum: Reviewed and agree with assessment and management plan. Yeily Link M, MD  

## 2022-02-15 ENCOUNTER — Telehealth: Payer: Self-pay

## 2022-02-15 NOTE — Telephone Encounter (Signed)
This is a lot of different things going on. I have not seen this patient since 03/2021, and that was only for a sick visit. I feel like I need to see this person. I think it's going to have to be more that 20 minutes do to the multiple complaints and needs right now.

## 2022-02-15 NOTE — Progress Notes (Signed)
Labs look good. Blood sugars a little higher than last year. Cholesterol normal. Improved kidney functions.

## 2022-02-15 NOTE — Telephone Encounter (Signed)
Pt daughter stopped by needing some assistance to treat the Bed sores. Pt daughter states its 2 clusters of pressure sores. Pt daughter describes them as rough. She is currently use Endit.   Pt is getting weak from her recently being sick with liquid in the lungs. Pt has lost about 8 lbs. Currently in Afib.   Pt daughter is thinking she will need a HOS bed very soon. Pt daughter is wanting to see if she can get some assistance from [facility] aide, Nurse and maybe PT to help with strengthen the body  Pt has been seen by cardiology and its nothing that they recommended for Afib.  Please advise.

## 2022-02-16 ENCOUNTER — Other Ambulatory Visit: Payer: Self-pay | Admitting: Nurse Practitioner

## 2022-02-16 ENCOUNTER — Telehealth: Payer: Self-pay | Admitting: Gastroenterology

## 2022-02-16 DIAGNOSIS — Z760 Encounter for issue of repeat prescription: Secondary | ICD-10-CM

## 2022-02-16 NOTE — Telephone Encounter (Signed)
Left message on machine to call back  

## 2022-02-16 NOTE — Telephone Encounter (Signed)
Patien's daughter, Tia Alert, called to report on patient.  She has been taking 1/2 Zofran in the morning and 1/2 at night.  This regimen is not working for her at all.  She is still complaining of not feeling good.  Daughter is asking if she can go back to 1 Zofran in the morning and 1 before supper.  If this is acceptable, she will need a refill before Monday.  Please call Ms. Snider and advise.  Thank you.

## 2022-02-17 MED ORDER — ONDANSETRON HCL 4 MG PO TABS: 4.0000 mg | ORAL_TABLET | Freq: Two times a day (BID) | ORAL | 1 refills | Status: DC

## 2022-02-17 NOTE — Telephone Encounter (Signed)
Donna Arias see below: the pt daughter is calling to report that the decrease in zofran is not working as well.  She would like to increase back to 1 pill BID instead of 1/2 pill BID.  Please advise if this is ok   ASSESSMENT AND PLAN: *Nausea and vomiting:  ? Source.  CT scan of the abdomen and pelvis unremarkable.  Was placed on pantoprazole 40 mg daily and Zofran which she has been using twice a day.  Has had no further issues in over month.  At that time she was also having a lot of pulmonary/lung issues with mucus and drainage, etc.  Will try to decrease her Zofran use.  Advised the daughter to try to cut the pills in half and just use 2 mg twice daily for a while to see how she does.  Can slowly introduce other foods into her diet. *Constipation: Had done well on MiraLAX for a long time.  That was stopped during the time she was having issues with nausea and vomiting.  Several Colace a day do not really seem to be helping.  Okay to restart her MiraLAX at this point.   **Her daughter will update [name] via MyChart in a couple of weeks.

## 2022-02-17 NOTE — Telephone Encounter (Signed)
The pt daughter has been advised and will increase to 1 pill twice daily.  I have sent in refills.

## 2022-02-18 ENCOUNTER — Ambulatory Visit: Payer: Medicare HMO | Admitting: Dermatology

## 2022-02-18 ENCOUNTER — Other Ambulatory Visit: Payer: Self-pay | Admitting: Adult Health

## 2022-02-18 VITALS — BP 117/74 | HR 88

## 2022-02-18 DIAGNOSIS — L578 Other skin changes due to chronic exposure to nonionizing radiation: Secondary | ICD-10-CM

## 2022-02-18 DIAGNOSIS — Z85828 Personal history of other malignant neoplasm of skin: Secondary | ICD-10-CM | POA: Diagnosis not present

## 2022-02-18 DIAGNOSIS — L820 Inflamed seborrheic keratosis: Secondary | ICD-10-CM | POA: Diagnosis not present

## 2022-02-18 DIAGNOSIS — Z8589 Personal history of malignant neoplasm of other organs and systems: Secondary | ICD-10-CM

## 2022-02-18 DIAGNOSIS — L570 Actinic keratosis: Secondary | ICD-10-CM

## 2022-02-18 NOTE — Progress Notes (Signed)
   Follow-Up Visit   Subjective  Donna Arias is a 89 y.o. female who presents for the following: Actinic Keratosis (Face, 71mf/u) and check spot (L lower leg, long hx has become crusty). The patient has spots, moles and lesions to be evaluated, some may be new or changing and the patient has concerns that these could be cancer.  Patient accompanied by daughter who contributes to history  The following portions of the chart were reviewed this encounter and updated as appropriate:   Tobacco  Allergies  Meds  Problems  Med Hx  Surg Hx  Fam Hx     Review of Systems:  No other skin or systemic complaints except as noted in HPI or Assessment and Plan.  Objective  Well appearing patient in no apparent distress; mood and affect are within normal limits.  A focused examination was performed including face, left lower leg. Relevant physical exam findings are noted in the Assessment and Plan.  face x 16 (16) Pink scaly macules  L lat calf x 1 Stuck on waxy paps with erythema   Assessment & Plan   Actinic Damage - chronic, secondary to cumulative UV radiation exposure/sun exposure over time - diffuse scaly erythematous macules with underlying dyspigmentation - Recommend daily broad spectrum sunscreen SPF 30+ to sun-exposed areas, reapply every 2 hours as needed.  - Recommend staying in the shade or wearing long sleeves, sun glasses (UVA+UVB protection) and wide brim hats (4-inch brim around the entire circumference of the hat). - Call for new or changing lesions.   History of Squamous Cell Carcinoma of the Skin - No evidence of recurrence today - No lymphadenopathy - Recommend regular full body skin exams - Recommend daily broad spectrum sunscreen SPF 30+ to sun-exposed areas, reapply every 2 hours as needed.  - Call if any new or changing lesions are noted between office visits - L zygoma  AK (actinic keratosis) (16) face x 16 Destruction of lesion - face x 16 Complexity:  simple   Destruction method: cryotherapy   Informed consent: discussed and consent obtained   Timeout:  patient name, date of birth, surgical site, and procedure verified Lesion destroyed using liquid nitrogen: Yes   Region frozen until ice ball extended beyond lesion: Yes   Outcome: patient tolerated procedure well with no complications   Post-procedure details: wound care instructions given    Inflamed seborrheic keratosis L lat calf x 1 Symptomatic, irritating, patient would like treated. Recheck on f/u Destruction of lesion - L lat calf x 1 Complexity: simple   Destruction method: cryotherapy   Informed consent: discussed and consent obtained   Timeout:  patient name, date of birth, surgical site, and procedure verified Lesion destroyed using liquid nitrogen: Yes   Region frozen until ice ball extended beyond lesion: Yes   Outcome: patient tolerated procedure well with no complications   Post-procedure details: wound care instructions given    Return in about 4 months (around 06/19/2022) for recheck ISK L lat calf, AK f/u, hx of SCC.  I, SOthelia Pulling RMA, am acting as scribe for DSarina Ser MD . Documentation: I have reviewed the above documentation for accuracy and completeness, and I agree with the above.  DSarina Ser MD

## 2022-02-18 NOTE — Patient Instructions (Addendum)
Cryotherapy Aftercare  Wash gently with soap and water everyday.   Apply Vaseline and Band-Aid daily until healed.     Due to recent changes in healthcare laws, you may see results of your pathology and/or laboratory studies on MyChart before the doctors have had a chance to review them. We understand that in some cases there may be results that are confusing or concerning to you. Please understand that not all results are received at the same time and often the doctors may need to interpret multiple results in order to provide you with the best plan of care or course of treatment. Therefore, we ask that you please give us 2 business days to thoroughly review all your results before contacting the office for clarification. Should we see a critical lab result, you will be contacted sooner.   If You Need Anything After Your Visit  If you have any questions or concerns for your doctor, please call our main line at [phone] and press option 4 to reach your doctor's medical assistant. If no one answers, please leave a voicemail as directed and we will return your call as soon as possible. Messages left after 4 pm will be answered the following business day.   You may also send us a message via MyChart. We typically respond to MyChart messages within 1-2 business days.  For prescription refills, please ask your pharmacy to contact our office. Our fax number is [phone].  If you have an urgent issue when the clinic is closed that cannot wait until the next business day, you can page your doctor at the number below.    Please note that while we do our best to be available for urgent issues outside of office hours, we are not available 24/7.   If you have an urgent issue and are unable to reach us, you may choose to seek medical care at your doctor's office, retail clinic, urgent care center, or emergency room.  If you have a medical emergency, please immediately call 911 or go to the  emergency department.  Pager Numbers  - Dr. Kowalski: [phone]  - Dr. Moye: [phone]  - Dr. Stewart: [phone]  In the event of inclement weather, please call our main line at [phone] for an update on the status of any delays or closures.  Dermatology Medication Tips: Please keep the boxes that topical medications come in in order to help keep track of the instructions about where and how to use these. Pharmacies typically print the medication instructions only on the boxes and not directly on the medication tubes.   If your medication is too expensive, please contact our office at [phone] option 4 or send us a message through MyChart.   We are unable to tell what your co-pay for medications will be in advance as this is different depending on your insurance coverage. However, we may be able to find a substitute medication at lower cost or fill out paperwork to get insurance to cover a needed medication.   If a prior authorization is required to get your medication covered by your insurance company, please allow us 1-2 business days to complete this process.  Drug prices often vary depending on where the prescription is filled and some pharmacies may offer cheaper prices.  The website www.goodrx.com contains coupons for medications through different pharmacies. The prices here do not account for what the cost may be with help from insurance (it may be cheaper with your insurance), but the website can   give you the price if you did not use any insurance.  - You can print the associated coupon and take it with your prescription to the pharmacy.  - You may also stop by our office during regular business hours and pick up a GoodRx coupon card.  - If you need your prescription sent electronically to a different pharmacy, notify our office through [place] MyChart or by phone at [phone] option 4.     Si Usted Necesita Algo Despus de Su Visita  Tambin puede  enviarnos un mensaje a travs de MyChart. Por lo general respondemos a los mensajes de MyChart en el transcurso de 1 a 2 das hbiles.  Para renovar recetas, por favor pida a su farmacia que se ponga en contacto con nuestra oficina. Nuestro nmero de fax es el [phone].  Si tiene un asunto urgente cuando la clnica est cerrada y que no puede esperar hasta el siguiente da hbil, puede llamar/localizar a su doctor(a) al nmero que aparece a continuacin.   Por favor, tenga en cuenta que aunque hacemos todo lo posible para estar disponibles para asuntos urgentes fuera del horario de oficina, no estamos disponibles las 24 horas del da, los 7 das de la semana.   Si tiene un problema urgente y no puede comunicarse con nosotros, puede optar por buscar atencin mdica  en el consultorio de su doctor(a), en una clnica privada, en un centro de atencin urgente o en una sala de emergencias.  Si tiene una emergencia mdica, por favor llame inmediatamente al 911 o vaya a la sala de emergencias.  Nmeros de bper  - Dr. Kowalski: [phone]  - Dra. Moye: [phone]  - Dra. Stewart: [phone]  En caso de inclemencias del tiempo, por favor llame a nuestra lnea principal al [phone] para una actualizacin sobre el estado de cualquier retraso o cierre.  Consejos para la medicacin en dermatologa: Por favor, guarde las cajas en las que vienen los medicamentos de uso tpico para ayudarle a seguir las instrucciones sobre dnde y cmo usarlos. Las farmacias generalmente imprimen las instrucciones del medicamento slo en las cajas y no directamente en los tubos del medicamento.   Si su medicamento es muy caro, por favor, pngase en contacto con nuestra oficina llamando al [phone] y presione la opcin 4 o envenos un mensaje a travs de MyChart.   No podemos decirle cul ser su copago por los medicamentos por adelantado ya que esto es diferente dependiendo de la cobertura de su seguro.  Sin embargo, es posible que podamos encontrar un medicamento sustituto a menor costo o llenar un formulario para que el seguro cubra el medicamento que se considera necesario.   Si se requiere una autorizacin previa para que su compaa de seguros cubra su medicamento, por favor permtanos de 1 a 2 das hbiles para completar este proceso.  Los precios de los medicamentos varan con frecuencia dependiendo del lugar de dnde se surte la receta y alguna farmacias pueden ofrecer precios ms baratos.  El sitio web www.goodrx.com tiene cupones para medicamentos de diferentes farmacias. Los precios aqu no tienen en cuenta lo que podra costar con la ayuda del seguro (puede ser ms barato con su seguro), pero el sitio web puede darle el precio si no utiliz ningn seguro.  - Puede imprimir el cupn correspondiente y llevarlo con su receta a la farmacia.  - Tambin puede pasar por nuestra oficina durante el horario de atencin regular y recoger una tarjeta de cupones de GoodRx.  -   Si necesita que su receta se enve electrnicamente a una farmacia diferente, informe a nuestra oficina a travs de MyChart de [place] o por telfono llamando al [phone] y presione la opcin 4.  

## 2022-02-21 NOTE — Progress Notes (Signed)
Established patient visit   Patient: Donna Arias   DOB: [birth year]   89 y.o. Female  MRN: [id number] Visit Date: 02/22/2022   Chief Complaint  Patient presents with   Hospitalization Follow-up   Subjective    HPI  Hospital follow up  -seen in ER for vomiting.  --medium sized pleural effusion which was not new  -aortic atherosclerosis.  -chest x-rays have shown small and stable pleural effusion.  -has seen pulmonary provider -has seen [facility] provider  -daughter is concerned about skin lesions on right buttocks.  --unsure if this is pressure injury or shingles.  --daughter has been using vaseline, A & D ointment, zinc ointments to keep skin dry and prevent pressure injury  --has 5 inch orthopedic cushion in her lift chair. --some lesions have been present for months.  --blisters have been progressing in lines  --blisters will rupture and then spread to nearby skin.  -the lesions are local to right buttocks. Painful, itching, and gradually worsening.  -the patient is active. Does need assistance with balance and walking.  --daughter states that she gets her up to move around every few hours.    Medications: Outpatient Medications Prior to Visit  Medication Sig   albuterol (PROVENTIL) (2.5 MG/3ML) 0.083% nebulizer solution Take 3 mLs (2.5 mg total) by nebulization every 6 (six) hours as needed for wheezing or shortness of breath. (Patient not taking: Reported on 03/10/2022)   aspirin 81 MG tablet Take 81 mg by mouth every evening.   atorvastatin (LIPITOR) 10 MG tablet Take 1 tablet (10 mg total) by mouth daily.   benzonatate (TESSALON) 100 MG capsule Take 1 capsule (100 mg total) by mouth 3 (three) times daily as needed for cough.   budesonide (PULMICORT) 0.25 MG/2ML nebulizer solution Take 2 mLs (0.25 mg total) by nebulization in the morning and at bedtime.   dextromethorphan-guaiFENesin (MUCINEX DM) 30-600 MG 12hr tablet Take 1 tablet by mouth 2 (two) times daily as needed for  cough.   erythromycin ophthalmic ointment Place 1 application into the left eye at bedtime. (Patient taking differently: Place 1 application  into the left eye at bedtime as needed (dry eyes).)   fexofenadine (ALLEGRA) 180 MG tablet Take 180 mg by mouth at bedtime.   fluticasone (FLONASE) 50 MCG/ACT nasal spray Place 1 spray into both nostrils 2 (two) times daily. (Patient taking differently: Place 1 spray into both nostrils 2 (two) times daily as needed for allergies.)   ipratropium-albuterol (DUONEB) 0.5-2.5 (3) MG/3ML SOLN Take 3 mLs by nebulization every 4 (four) hours as needed. (Patient taking differently: Take 3 mLs by nebulization every 6 (six) hours as needed (sob/wheezing).)   ketoconazole (NIZORAL) 2 % shampoo APPLY TOPICALLY 2 TIMES A WEEK. (Patient taking differently: Apply 1 Application topically once a week.)   levothyroxine (SYNTHROID) 25 MCG tablet TAKE 1/2 TABLET BY MOUTH DAILY WITH 50 MCG TO EQUAL 62.5 MCG TOTAL DAILY (Patient taking differently: Take 12.5 mcg by mouth See admin instructions. TAKE 1/2 TABLET BY MOUTH DAILY WITH 50 MCG TO EQUAL 62.5 MCG TOTAL DAILY)   levothyroxine (SYNTHROID) 50 MCG tablet TAKE 1 TABLET BY MOUTH EVERY DAY (Patient taking differently: Take 50 mcg by mouth daily before breakfast.)   loratadine (CLARITIN) 10 MG tablet Take 10 mg by mouth daily.   mupirocin ointment (BACTROBAN) 2 % APPLY TO AFFECTED AREA ON THE SKIN 2 TIMES A DAY (Patient taking differently: Apply 1 Application topically 2 (two) times daily as needed (skin irritation).)   nystatin (MYCOSTATIN/NYSTOP)  powder Apply 1 Application topically 2 (two) times daily. (Patient taking differently: Apply 1 Application topically 2 (two) times daily. Apply to groin area)   Spacer/Aero-Holding Chambers (AEROCHAMBER PLUS WITH MASK) inhaler Use with Stiolto inhalations.   [DISCONTINUED] amLODipine (NORVASC) 2.5 MG tablet Take 1 tablet (2.5 mg total) by mouth daily. (Patient taking differently: Take 2.5 mg  by mouth every evening.)   [DISCONTINUED] Calcium Carbonate-Vit D-Min (CALCIUM 1200 PO) Take 1 tablet by mouth daily. (Patient not taking: Reported on 03/10/2022)   [DISCONTINUED] Cranberry 500 MG TABS Take 2 tablets by mouth 3 (three) times a week. (Patient not taking: Reported on 03/10/2022)   [DISCONTINUED] FLUoxetine (PROZAC) 10 MG capsule TAKE 1 CAPSULE BY MOUTH DAILY.   [DISCONTINUED] furosemide (LASIX) 20 MG tablet TAKE 1 TABLET BY MOUTH DAILY.   [DISCONTINUED] mupirocin ointment (BACTROBAN) 2 % Apply 1 Application topically daily. With dressing changes (Patient taking differently: Apply 1 Application topically daily as needed (rash). With dressing changes)   [DISCONTINUED] ondansetron (ZOFRAN) 4 MG tablet Take 1 tablet (4 mg total) by mouth 2 (two) times daily.   [DISCONTINUED] pantoprazole (PROTONIX) 40 MG tablet Take 1 tablet (40 mg total) by mouth daily.   [DISCONTINUED] Polyvinyl Alcohol-Povidone PF (REFRESH) 1.4-0.6 % SOLN Apply 1 drop to eye 2 (two) times daily as needed (dry eyes). (Patient not taking: Reported on 03/10/2022)   [DISCONTINUED] potassium chloride SA (KLOR-CON M) 20 MEQ tablet Take 1 tablet (20 mEq total) by mouth daily.   [DISCONTINUED] risperiDONE (RISPERDAL) 1 MG tablet TAKE 1 TABLET BY MOUTH 2 TIMES DAILY. (Patient taking differently: Take 1 mg by mouth at bedtime.)   [DISCONTINUED] STIOLTO RESPIMAT 2.5-2.5 MCG/ACT AERS INHALE 2 PUFFS INTO THE LUNGS DAILY.   [DISCONTINUED] triamcinolone cream (KENALOG) 0.1 % Apply 1 application topically 2 (two) times daily. Request jar (Patient not taking: Reported on 03/10/2022)   No facility-administered medications prior to visit.    Review of Systems  All other systems reviewed and are negative.   Last CBC Lab Results  Component Value Date   WBC 6.3 03/12/2022   HGB 11.8 (L) 03/12/2022   HCT 33.9 (L) 03/12/2022   MCV 87.6 03/12/2022   MCH 30.5 03/12/2022   RDW 16.0 (H) 03/12/2022   PLT 154 [id number]   Last metabolic  panel Lab Results  Component Value Date   GLUCOSE 97 03/15/2022   NA 134 (L) 03/15/2022   K 4.2 03/15/2022   CL 97 (L) 03/15/2022   CO2 28 03/15/2022   BUN 19 03/15/2022   CREATININE 1.20 (H) 03/15/2022   GFRNONAA 42 (L) 03/15/2022   CALCIUM 8.9 03/15/2022   PROT 5.9 (L) 03/11/2022   ALBUMIN 3.2 (L) 03/11/2022   LABGLOB 2.8 12/28/2021   AGRATIO 1.4 12/28/2021   BILITOT 1.1 03/11/2022   ALKPHOS 69 03/11/2022   AST 19 03/11/2022   ALT 12 03/11/2022   ANIONGAP 9 03/15/2022   Last lipids Lab Results  Component Value Date   CHOL 173 12/28/2021   HDL 60 12/28/2021   LDLCALC 98 12/28/2021   LDLDIRECT 148.4 04/14/2012   TRIG 80 12/28/2021   CHOLHDL 2.9 12/28/2021   Last hemoglobin A1c Lab Results  Component Value Date   HGBA1C 6.2 (H) 12/28/2021   Last thyroid functions Lab Results  Component Value Date   TSH 3.500 12/28/2021   Last vitamin D Lab Results  Component Value Date   VD25OH 37.5 01/20/2017   Last vitamin B12 and Folate Lab Results  Component Value Date  VITAMINB12 321 10/25/2016       Objective     Today's Vitals   02/22/22 1058  BP: 118/75  Pulse: 95  Resp: 18  SpO2: 94%  Weight: 131 lb (59.4 kg)  Height: '5\' 3"'$  (1.6 m)   Body mass index is 23.21 kg/m.  BP Readings from Last 3 Encounters:  03/15/22 125/75  03/08/22 126/71  02/22/22 118/75    Wt Readings from Last 3 Encounters:  03/15/22 119 lb (54 kg)  03/08/22 131 lb (59.4 kg)  02/22/22 131 lb (59.4 kg)    Physical Exam Vitals and nursing note reviewed.  Constitutional:      Appearance: Normal appearance. She is well-developed.  HENT:     Head: Normocephalic and atraumatic.     Nose: Nose normal.     Mouth/Throat:     Mouth: Mucous membranes are moist.     Pharynx: Oropharynx is clear.  Eyes:     Conjunctiva/sclera: Conjunctivae normal.     Pupils: Pupils are equal, round, and reactive to light.  Neck:     Vascular: No carotid bruit.  Cardiovascular:     Rate and  Rhythm: Normal rate and regular rhythm.     Pulses: Normal pulses.     Heart sounds: Normal heart sounds.  Pulmonary:     Effort: Pulmonary effort is normal.     Breath sounds: Normal breath sounds.     Comments: Minimal wheezing heard in bilateral lung bases  Abdominal:     Palpations: Abdomen is soft.  Musculoskeletal:        General: Normal range of motion.     Cervical back: Normal range of motion and neck supple.  Lymphadenopathy:     Cervical: No cervical adenopathy.  Skin:    General: Skin is warm and dry.     Capillary Refill: Capillary refill takes less than 2 seconds.  Neurological:     General: No focal deficit present.     Mental Status: She is alert and oriented to person, place, and time. Mental status is at baseline.  Psychiatric:        Mood and Affect: Mood normal.        Behavior: Behavior normal.        Thought Content: Thought content normal.        Judgment: Judgment normal.      Assessment & Plan    1. Cutaneous candidiasis Trial Lotrisone cream. Apply to  effected areas  twice daily. Continue to use for 3 to 4 days after resolution of rash. Keep area as clean and dry as possible. Reassess at next visit.   - clotrimazole-betamethasone (LOTRISONE) cream; Apply 1 Application topically 2 (two) times daily. (Patient taking differently: Apply 1 Application topically 2 (two) times daily. Apply to right buttock)  Dispense: 45 g; Refill: 3  2. Chronic obstructive pulmonary disease, unspecified COPD type ([place]) Stable. Continue inhalers and respiratory medication as prescribed. Pulmonary visits as scheduled.   3. Acquired hypothyroidism Continue levothyroxine dose as prescribed   4. Essential hypertension Continue blood pressure medication as prescribed     Problem List Items Addressed This Visit       Cardiovascular and Mediastinum   Essential hypertension   Persistent atrial fibrillation (HCC)     Respiratory   COPD (chronic obstructive pulmonary  disease) ([place])     Endocrine   Hypothyroidism     Musculoskeletal and Integument   Cutaneous candidiasis - Primary   Relevant Medications   clotrimazole-betamethasone (LOTRISONE)  cream     Return for as scheduled.         Ronnell Freshwater, NP  [facility] [phone] (phone) [phone] (fax)  [place]

## 2022-02-22 ENCOUNTER — Encounter: Payer: Self-pay | Admitting: Nurse Practitioner

## 2022-02-22 ENCOUNTER — Other Ambulatory Visit: Payer: Self-pay | Admitting: Adult Health

## 2022-02-22 ENCOUNTER — Other Ambulatory Visit: Payer: Self-pay

## 2022-02-22 ENCOUNTER — Ambulatory Visit (INDEPENDENT_AMBULATORY_CARE_PROVIDER_SITE_OTHER): Payer: Medicare HMO | Admitting: Nurse Practitioner

## 2022-02-22 VITALS — BP 118/75 | HR 95 | Resp 18 | Ht 63.0 in | Wt 131.0 lb

## 2022-02-22 DIAGNOSIS — I10 Essential (primary) hypertension: Secondary | ICD-10-CM

## 2022-02-22 DIAGNOSIS — B372 Candidiasis of skin and nail: Secondary | ICD-10-CM

## 2022-02-22 DIAGNOSIS — I4819 Other persistent atrial fibrillation: Secondary | ICD-10-CM | POA: Diagnosis not present

## 2022-02-22 DIAGNOSIS — E039 Hypothyroidism, unspecified: Secondary | ICD-10-CM

## 2022-02-22 DIAGNOSIS — J449 Chronic obstructive pulmonary disease, unspecified: Secondary | ICD-10-CM | POA: Diagnosis not present

## 2022-02-22 MED ORDER — CLOTRIMAZOLE-BETAMETHASONE 1-0.05 % EX CREA: 1.0000 | TOPICAL_CREAM | Freq: Two times a day (BID) | CUTANEOUS | 3 refills | Status: AC

## 2022-02-22 MED ORDER — ONDANSETRON HCL 4 MG PO TABS: 4.0000 mg | ORAL_TABLET | Freq: Two times a day (BID) | ORAL | 1 refills | Status: AC

## 2022-02-22 NOTE — Telephone Encounter (Signed)
The prescription has been resent and pt daughter aware

## 2022-02-22 NOTE — Telephone Encounter (Signed)
Patient's daughter called, stated pharmacy spoke to her and stated Zofran refill had not been sent in. Please advise.

## 2022-02-22 NOTE — Telephone Encounter (Signed)
The prescription was sent to the pharmacy on 1/24 see below.    ondansetron (ZOFRAN) 4 MG tablet [[id number]]   Order Details Dose: 4 mg Route: Oral Frequency: 2 times daily  Dispense Quantity: 60 tablet Refills: 1        Sig: Take 1 tablet (4 mg total) by mouth 2 (two) times daily.       Start Date: 02/17/22 End Date: --  Written Date: 02/17/22 Expiration Date: 02/17/23  Original Order: ondansetron (ZOFRAN) 4 MG tablet [[id number]]  Providers   Ordering and Authorizing Provider: Loralie Champagne, PA-C [place], [place] [address] Phone: [phone]   Fax: [phone] DEA #: [id number]   NPI: [id number]      Ordering User: Timothy Lasso, RN

## 2022-02-24 NOTE — Progress Notes (Signed)
[facility] Note  03/01/2022     CHIEF COMPLAINT Patient presents for Retina Follow Up   HISTORY OF PRESENT ILLNESS: Donna Arias is a 89 y.o. female who presents to the clinic today for:   HPI     Retina Follow Up   Patient presents with  Wet AMD.  In right eye.  Severity is moderate.  Duration of 8 weeks.  Since onset it is gradually improving.  I, the attending physician,  performed the HPI with the patient and updated documentation appropriately.        Comments   Pt here for 8 wk ret f/u exu ARMD OD. Pt states [facility] in OD is improving. Reading numbers on the weather channel better per daughter.       Last edited by Bernarda Caffey, MD on 03/01/2022  2:33 PM.     Referring physician: Lorrene Reid, PA-C No address on file  HISTORICAL INFORMATION:   Selected notes from the [facility] Previous Dr. Zadie Rhine pt LEE: 08.16.23 Ocular Hx- ex ARMD OU, CRAO OS PMH-    CURRENT MEDICATIONS: Current Outpatient Medications (Ophthalmic Drugs)  Medication Sig   erythromycin ophthalmic ointment Place 1 application into the left eye at bedtime. (Patient taking differently: Place 1 application  into the left eye at bedtime as needed (dry eyes).)   Polyvinyl Alcohol-Povidone PF (REFRESH) 1.4-0.6 % SOLN Apply 1 drop to eye 2 (two) times daily as needed (dry eyes).   No current facility-administered medications for this visit. (Ophthalmic Drugs)   Current Outpatient Medications (Other)  Medication Sig   albuterol (PROVENTIL) (2.5 MG/3ML) 0.083% nebulizer solution Take 3 mLs (2.5 mg total) by nebulization every 6 (six) hours as needed for wheezing or shortness of breath.   amLODipine (NORVASC) 2.5 MG tablet Take 1 tablet (2.5 mg total) by mouth daily.   aspirin 81 MG tablet Take 81 mg by mouth every evening.   atorvastatin (LIPITOR) 10 MG tablet Take 1 tablet (10 mg total) by mouth daily.   benzonatate (TESSALON) 100 MG capsule Take 1 capsule  (100 mg total) by mouth 3 (three) times daily as needed for cough.   budesonide (PULMICORT) 0.25 MG/2ML nebulizer solution Take 2 mLs (0.25 mg total) by nebulization in the morning and at bedtime.   Calcium Carbonate-Vit D-Min (CALCIUM 1200 PO) Take 1 tablet by mouth daily.   clotrimazole-betamethasone (LOTRISONE) cream Apply 1 Application topically 2 (two) times daily.   Cranberry 500 MG TABS Take 2 tablets by mouth 3 (three) times a week.   dextromethorphan-guaiFENesin (MUCINEX DM) 30-600 MG 12hr tablet Take 1 tablet by mouth 2 (two) times daily as needed for cough.   fexofenadine (ALLEGRA) 180 MG tablet Take 180 mg by mouth daily as needed for allergies.   FLUoxetine (PROZAC) 10 MG capsule TAKE 1 CAPSULE BY MOUTH DAILY.   fluticasone (FLONASE) 50 MCG/ACT nasal spray Place 1 spray into both nostrils 2 (two) times daily. (Patient taking differently: Place 1 spray into both nostrils 2 (two) times daily as needed for allergies.)   furosemide (LASIX) 20 MG tablet TAKE 1 TABLET BY MOUTH DAILY.   ipratropium-albuterol (DUONEB) 0.5-2.5 (3) MG/3ML SOLN Take 3 mLs by nebulization every 4 (four) hours as needed. (Patient taking differently: Take 3 mLs by nebulization every 6 (six) hours as needed (sob/wheezing).)   ketoconazole (NIZORAL) 2 % shampoo APPLY TOPICALLY 2 TIMES A WEEK. (Patient taking differently: Apply 1 Application topically once a week.)   levothyroxine (SYNTHROID)  25 MCG tablet TAKE 1/2 TABLET BY MOUTH DAILY WITH 50 MCG TO EQUAL 62.5 MCG TOTAL DAILY   levothyroxine (SYNTHROID) 50 MCG tablet TAKE 1 TABLET BY MOUTH EVERY DAY   loratadine (CLARITIN) 10 MG tablet Take 10 mg by mouth daily.   mupirocin ointment (BACTROBAN) 2 % APPLY TO AFFECTED AREA ON THE SKIN 2 TIMES A DAY (Patient taking differently: Apply 1 Application topically 2 (two) times daily as needed (skin irritation).)   mupirocin ointment (BACTROBAN) 2 % Apply 1 Application topically daily. With dressing changes (Patient taking  differently: Apply 1 Application topically daily as needed (rash). With dressing changes)   nystatin (MYCOSTATIN/NYSTOP) powder Apply 1 Application topically 2 (two) times daily.   ondansetron (ZOFRAN) 4 MG tablet Take 1 tablet (4 mg total) by mouth 2 (two) times daily.   pantoprazole (PROTONIX) 40 MG tablet Take 1 tablet (40 mg total) by mouth daily.   potassium chloride SA (KLOR-CON M) 20 MEQ tablet Take 1 tablet (20 mEq total) by mouth daily.   risperiDONE (RISPERDAL) 1 MG tablet TAKE 1 TABLET BY MOUTH 2 TIMES DAILY.   Spacer/Aero-Holding Chambers (AEROCHAMBER PLUS WITH MASK) inhaler Use with Stiolto inhalations.   STIOLTO RESPIMAT 2.5-2.5 MCG/ACT AERS INHALE 2 PUFFS INTO THE LUNGS DAILY.   triamcinolone cream (KENALOG) 0.1 % Apply 1 application topically 2 (two) times daily. Request jar   No current facility-administered medications for this visit. (Other)   REVIEW OF SYSTEMS: ROS   Positive for: Musculoskeletal, Cardiovascular, Eyes, Respiratory Negative for: Constitutional, Gastrointestinal, Neurological, Skin, Genitourinary, HENT, Endocrine, Psychiatric, Allergic/Imm, Heme/Lymph Last edited by Kingsley Spittle, COT on 03/01/2022  1:07 PM.       ALLERGIES Allergies  Allergen Reactions   Picato [Ingenol Mebutate] Hives   Codeine Sulfate Other (See Comments)    REACTION: unspecified   PAST MEDICAL HISTORY Past Medical History:  Diagnosis Date   Chronic headaches    COPD (chronic obstructive pulmonary disease) ([place])    Dementia ([place])    Eye infection, left 01/16/2018   GERD (gastroesophageal reflux disease)    Heart attack [place]) 1998   HLD (hyperlipidemia)    Hypertension    Skin cancer    squamous cell of left face   Squamous cell carcinoma of skin 11/03/2021   L zygoma - poorly differentiated   Stroke ([place])    Thyroid disease    Past Surgical History:  Procedure Laterality Date   BREAST LUMPECTOMY Left 1995   DILATION AND CURETTAGE OF [place] HISTORY Family History  Problem Relation Age of Onset   Hypertension Mother    Stroke Mother    Stroke Sister    Alzheimer's disease Sister    Stroke Sister    Hypertension Sister    Stroke Sister    Breast cancer Sister    SOCIAL HISTORY Social History   Tobacco Use   Smoking status: Former    Types: Cigarettes    Quit date: 1995    Years since quitting: 29.1    Passive exposure: Never   Smokeless tobacco: Never  Vaping Use   Vaping Use: Never used  Substance Use Topics   Alcohol use: No   Drug use: No       OPHTHALMIC EXAM:  Base Eye Exam     Visual Acuity (Snellen - Linear)       Right Left   Dist cc 20/40 CF at 3'   Dist ph cc  NI NI    Correction: Glasses         Tonometry (Tonopen, 1:16 PM)       Right Left   Pressure 11 10         Pupils       Dark Light Shape React APD   Right 3 3 Round Minimal None   Left 3 3 Round Minimal None         Visual Fields (Counting fingers)       Left Right    Full Full         Extraocular Movement       Right Left    Full, Ortho Full, Ortho         Neuro/Psych     Oriented x3: Yes   Mood/Affect: Normal         Dilation     Both eyes: 1.0% Mydriacyl, 2.5% Phenylephrine @ 1:17 PM           Slit Lamp and Fundus Exam     External Exam       Right Left   External Normal Normal         Slit Lamp Exam       Right Left   Lids/Lashes Dermatochalasis - upper lid, Meibomian gland dysfunction Dermatochalasis - upper lid, Meibomian gland dysfunction   Conjunctiva/Sclera White and quiet White and quiet   Cornea arcus, trace PEE, well healed cataract wound arcus, well healed cataract wound, 1-2+ inferior Punctate epithelial erosions   Anterior Chamber deep and clear deep and clear   Iris Round and dilated Round and dilated   Lens PC IOL in good position, trace Posterior capsular opacification PC IOL in good position   Anterior Vitreous Vitreous syneresis  Vitreous syneresis         Fundus Exam       Right Left   Disc mild Pallor, Sharp rim, PPA, mild disc heme at 0700 2+Pallor, Sharp rim, 360 PPA   C/D Ratio 0.2 0.6   Macula Flat, Blunted foveal reflex, Drusen, RPE mottling and clumping, central CNV with trace cystic changes / edema -- improved, focal central pigment clump, telangiectasias inferior mac, no heme Flat, Blunted foveal reflex, central RPE atrophy, RPE mottling and clumping, diffuse atrophy, no heme   Vessels attenuated, Tortuous Severer attenuation, mild tortuosity   Periphery Attached, No heme Attached, scattered CR scars           Refraction     Wearing Rx       Sphere Cylinder Axis Add   Right -0.50 +0.75 015 +2.50   Left -0.50 +0.25 143 +2.50           IMAGING AND PROCEDURES  Imaging and Procedures for 03/01/2022  OCT, Retina - OU - Both Eyes       Right Eye Quality was good. Scan locations included subfoveal. Central Foveal Thickness: 282. Progression has improved. Findings include normal foveal contour, no IRF, no SRF, retinal drusen , subretinal hyper-reflective material, intraretinal hyper-reflective material, epiretinal membrane, pigment epithelial detachment, outer retinal atrophy (stable improvement in cystic changes nasal fovea, interval improvement in central [facility]).   Left Eye Quality was good. Scan locations included nasal. Central Foveal Thickness: 193. Progression has improved. Findings include no IRF, no SRF, abnormal foveal contour, retinal drusen , subretinal hyper-reflective material, inner retinal atrophy, outer retinal atrophy (Diffuse retinal atrophy / ischemia, central SRHM / scar with surrounding cystic changes - improved).   Notes *Images captured and stored  on drive  Diagnosis / Impression:  OD: ex ARMD -- stable improvement in cystic changes nasal fovea, interval improvement in central SRHM OS: Diffuse retinal atrophy / ischemia, central SRHM / scar with surrounding cystic changes  - improved  Clinical management:  See below  Abbreviations: NFP - Normal foveal profile. CME - cystoid macular edema. PED - pigment epithelial detachment. IRF - intraretinal fluid. SRF - subretinal fluid. EZ - ellipsoid zone. ERM - epiretinal membrane. ORA - outer retinal atrophy. ORT - outer retinal tubulation. SRHM - subretinal hyper-reflective material. IRHM - intraretinal hyper-reflective material      Intravitreal Injection, Pharmacologic Agent - OD - Right Eye       Time Out 03/01/2022. 1:48 PM. Confirmed correct patient, procedure, site, and patient consented.   Anesthesia Topical anesthesia was used. Anesthetic medications included Lidocaine 2%, Proparacaine 0.5%.   Procedure Preparation included 5% betadine to ocular surface, eyelid speculum. A supplied (32g) needle was used.   Injection: 1.25 mg Bevacizumab 1.'25mg'$ /0.51m   Route: Intravitreal, Site: Right Eye   NDC: [id number] Lot:: [id number] Expiration date: 04/17/2022   Post-op Post injection exam found visual acuity of at least counting fingers. The patient tolerated the procedure well. There were no complications. The patient received written and verbal post procedure care education.            ASSESSMENT/PLAN:    ICD-10-CM   1. Exudative age-related macular degeneration of right eye with active choroidal neovascularization (HCC)  H35.3211 OCT, Retina - OU - Both Eyes    Intravitreal Injection, Pharmacologic Agent - OD - Right Eye    Bevacizumab (AVASTIN) SOLN 1.25 mg    2. Exudative age-related macular degeneration of left eye with inactive choroidal neovascularization ([place]  H35.3222     3. Retinal ischemia  H35.82     4. Essential hypertension  I10     5. Hypertensive retinopathy of both eyes  H35.033     6. Pseudophakia of both eyes  Z96.1      Exudative age related macular degeneration, OD  - previous patient of Dr. RZadie Rhine  - history of IVA OD x16 since November 2021-08.17.23 recorded in  Epic  **history of increased IRF at 9 wks on 10.23.23 visit**  - s/p IVA OD #17 (10.23.23), #18 (12.11.23)  - BCVA OD 20/40 -- stable  - OCT shows OD: Interval improvement in central cystic changes nasal fovea at 8 weeks  - recommend IVA OD #19 today, 02.05.24 with follow up extended to 9 weeks - pt wishes to be treated with IVA OD - RBA of procedure discussed, questions answered - IVA informed consent obtained and signed, 10.23.23 (OD) - see procedure note  - f/u in 9 wks -- DFE/OCT, possible injection  2,3. Exudative age related macular degeneration with retinal ischemia OS -- inactive  - pt and daughter report hx of CRAO OS  - BCVA OS CF 3' -- stable - exam shows central retinal atrophy  - OCT shows Diffuse retinal atrophy / ischemia, central SRHM / scar with surrounding cystic changes -- trace  - f/u in 9 wks -- DFE/OCT  4,5. Hypertensive retinopathy OU - discussed importance of tight BP control - monitor  6. Pseudophakia OU  - s/p CE/IOL OU  - IOLs in good position, doing well  - monitor  Ophthalmic Meds Ordered this visit:  Meds ordered this encounter  Medications   Bevacizumab (AVASTIN) SOLN 1.25 mg     Return in about 9 weeks (around 05/03/2022) for  f/u exu ARMD OD, DFE, OCT.  There are no Patient Instructions on file for this visit.   Explained the diagnoses, plan, and follow up with the patient and they expressed understanding.  Patient expressed understanding of the importance of proper follow up care.   This document serves as a record of services personally performed by Gardiner Sleeper, MD, PhD. It was created on their behalf by Orvan Falconer, an ophthalmic technician. The creation of this record is the provider's dictation and/or activities during the visit.    Electronically signed by: Orvan Falconer, OA, 03/01/22  3:37 PM  This document serves as a record of services personally performed by Gardiner Sleeper, MD, PhD. It was created on their behalf by  [place]. Owens Shark, OA an ophthalmic technician. The creation of this record is the provider's dictation and/or activities during the visit.    Electronically signed by: San Jetty. Owens Shark, [place] 02.05.2024 3:37 PM  Gardiner Sleeper, M.D., Ph.D. Diseases & Surgery of the Retina and Vitreous [facility]  I have reviewed the above documentation for accuracy and completeness, and I agree with the above. Gardiner Sleeper, M.D., Ph.D. 03/01/22 3:37 PM   Abbreviations: M myopia (nearsighted); A astigmatism; H hyperopia (farsighted); P presbyopia; Mrx spectacle prescription;  CTL contact lenses; OD right eye; OS left eye; OU both eyes  XT exotropia; ET esotropia; PEK punctate epithelial keratitis; PEE punctate epithelial erosions; DES dry eye syndrome; MGD meibomian gland dysfunction; ATs artificial tears; PFAT's preservative free artificial tears; [place] nuclear sclerotic cataract; PSC posterior subcapsular cataract; ERM epi-retinal membrane; PVD posterior vitreous detachment; RD retinal detachment; DM diabetes mellitus; DR diabetic retinopathy; NPDR non-proliferative diabetic retinopathy; PDR proliferative diabetic retinopathy; CSME clinically significant macular edema; DME diabetic macular edema; dbh dot blot hemorrhages; CWS cotton wool spot; POAG primary open angle glaucoma; C/D cup-to-disc ratio; HVF humphrey visual field; GVF goldmann visual field; OCT optical coherence tomography; IOP intraocular pressure; BRVO Branch retinal vein occlusion; CRVO central retinal vein occlusion; CRAO central retinal artery occlusion; BRAO branch retinal artery occlusion; RT retinal tear; SB scleral buckle; PPV pars plana vitrectomy; VH Vitreous hemorrhage; PRP panretinal laser photocoagulation; IVK intravitreal kenalog; VMT vitreomacular traction; MH Macular hole;  NVD neovascularization of the disc; NVE neovascularization elsewhere; AREDS age related eye disease study; ARMD age related macular degeneration;  POAG primary open angle glaucoma; EBMD epithelial/anterior basement membrane dystrophy; ACIOL anterior chamber intraocular lens; IOL intraocular lens; PCIOL posterior chamber intraocular lens; Phaco/IOL phacoemulsification with intraocular lens placement; [place] photorefractive keratectomy; LASIK laser assisted in situ keratomileusis; HTN hypertension; DM diabetes mellitus; COPD chronic obstructive pulmonary disease

## 2022-02-26 ENCOUNTER — Encounter: Payer: Self-pay | Admitting: Dermatology

## 2022-03-01 ENCOUNTER — Other Ambulatory Visit: Payer: Self-pay | Admitting: Nurse Practitioner

## 2022-03-01 ENCOUNTER — Encounter (INDEPENDENT_AMBULATORY_CARE_PROVIDER_SITE_OTHER): Payer: Self-pay | Admitting: Ophthalmology

## 2022-03-01 ENCOUNTER — Ambulatory Visit (INDEPENDENT_AMBULATORY_CARE_PROVIDER_SITE_OTHER): Payer: Medicare HMO | Admitting: Ophthalmology

## 2022-03-01 DIAGNOSIS — H353211 Exudative age-related macular degeneration, right eye, with active choroidal neovascularization: Secondary | ICD-10-CM

## 2022-03-01 DIAGNOSIS — Z961 Presence of intraocular lens: Secondary | ICD-10-CM | POA: Diagnosis not present

## 2022-03-01 DIAGNOSIS — H353231 Exudative age-related macular degeneration, bilateral, with active choroidal neovascularization: Secondary | ICD-10-CM

## 2022-03-01 DIAGNOSIS — I10 Essential (primary) hypertension: Secondary | ICD-10-CM

## 2022-03-01 DIAGNOSIS — H353222 Exudative age-related macular degeneration, left eye, with inactive choroidal neovascularization: Secondary | ICD-10-CM

## 2022-03-01 DIAGNOSIS — H35033 Hypertensive retinopathy, bilateral: Secondary | ICD-10-CM | POA: Diagnosis not present

## 2022-03-01 DIAGNOSIS — J449 Chronic obstructive pulmonary disease, unspecified: Secondary | ICD-10-CM

## 2022-03-01 DIAGNOSIS — H3582 Retinal ischemia: Secondary | ICD-10-CM

## 2022-03-01 MED ORDER — BEVACIZUMAB CHEMO INJECTION 1.25MG/0.05ML SYRINGE FOR KALEIDOSCOPE
1.2500 mg | INTRAVITREAL | Status: AC | PRN
  Administered 2022-03-01: 1.25 mg via INTRAVITREAL

## 2022-03-08 ENCOUNTER — Other Ambulatory Visit: Payer: Self-pay | Admitting: Adult Health

## 2022-03-08 ENCOUNTER — Encounter: Payer: Self-pay | Admitting: Nurse Practitioner

## 2022-03-08 ENCOUNTER — Ambulatory Visit (INDEPENDENT_AMBULATORY_CARE_PROVIDER_SITE_OTHER): Payer: Medicare HMO | Admitting: Nurse Practitioner

## 2022-03-08 ENCOUNTER — Other Ambulatory Visit: Payer: Self-pay | Admitting: Nurse Practitioner

## 2022-03-08 VITALS — BP 126/71 | HR 100 | Ht 63.0 in | Wt 131.0 lb

## 2022-03-08 DIAGNOSIS — Z8679 Personal history of other diseases of the circulatory system: Secondary | ICD-10-CM | POA: Diagnosis not present

## 2022-03-08 DIAGNOSIS — E039 Hypothyroidism, unspecified: Secondary | ICD-10-CM

## 2022-03-08 DIAGNOSIS — I10 Essential (primary) hypertension: Secondary | ICD-10-CM | POA: Diagnosis not present

## 2022-03-08 DIAGNOSIS — Z760 Encounter for issue of repeat prescription: Secondary | ICD-10-CM

## 2022-03-08 DIAGNOSIS — R601 Generalized edema: Secondary | ICD-10-CM | POA: Diagnosis not present

## 2022-03-08 MED ORDER — FUROSEMIDE 20 MG PO TABS: ORAL_TABLET | ORAL | 0 refills | Status: DC

## 2022-03-08 NOTE — Progress Notes (Signed)
Established patient visit   Patient: Donna Arias   DOB: [birth year]   89 y.o. Female  MRN: [id number] Visit Date: 03/08/2022  Chief Complaint  Patient presents with   Edema   Subjective    HPI  Presents to the office with her daughter.  -daughter states that she is having "mobile fluid" collection -had lumps of fluid on upper and lower back which were present after laying down.  --fluid goes away after siting up and more active.  --non tender.  -currently no areas of swelling present.  -having some lower extremity edema.  --non pitting at this time.  -has some shortness of breath --denies chest pain or chest pressure    Medications: Outpatient Medications Prior to Visit  Medication Sig   albuterol (PROVENTIL) (2.5 MG/3ML) 0.083% nebulizer solution Take 3 mLs (2.5 mg total) by nebulization every 6 (six) hours as needed for wheezing or shortness of breath. (Patient not taking: Reported on 03/10/2022)   aspirin 81 MG tablet Take 81 mg by mouth every evening.   atorvastatin (LIPITOR) 10 MG tablet Take 1 tablet (10 mg total) by mouth daily.   benzonatate (TESSALON) 100 MG capsule Take 1 capsule (100 mg total) by mouth 3 (three) times daily as needed for cough.   budesonide (PULMICORT) 0.25 MG/2ML nebulizer solution Take 2 mLs (0.25 mg total) by nebulization in the morning and at bedtime.   clotrimazole-betamethasone (LOTRISONE) cream Apply 1 Application topically 2 (two) times daily. (Patient taking differently: Apply 1 Application topically 2 (two) times daily. Apply to right buttock)   dextromethorphan-guaiFENesin (MUCINEX DM) 30-600 MG 12hr tablet Take 1 tablet by mouth 2 (two) times daily as needed for cough.   erythromycin ophthalmic ointment Place 1 application into the left eye at bedtime. (Patient taking differently: Place 1 application  into the left eye at bedtime as needed (dry eyes).)   fexofenadine (ALLEGRA) 180 MG tablet Take 180 mg by mouth at bedtime.   fluticasone  (FLONASE) 50 MCG/ACT nasal spray Place 1 spray into both nostrils 2 (two) times daily. (Patient taking differently: Place 1 spray into both nostrils 2 (two) times daily as needed for allergies.)   ipratropium-albuterol (DUONEB) 0.5-2.5 (3) MG/3ML SOLN Take 3 mLs by nebulization every 4 (four) hours as needed. (Patient taking differently: Take 3 mLs by nebulization every 6 (six) hours as needed (sob/wheezing).)   ketoconazole (NIZORAL) 2 % shampoo APPLY TOPICALLY 2 TIMES A WEEK. (Patient taking differently: Apply 1 Application topically once a week.)   levothyroxine (SYNTHROID) 25 MCG tablet TAKE 1/2 TABLET BY MOUTH DAILY WITH 50 MCG TO EQUAL 62.5 MCG TOTAL DAILY (Patient taking differently: Take 12.5 mcg by mouth See admin instructions. TAKE 1/2 TABLET BY MOUTH DAILY WITH 50 MCG TO EQUAL 62.5 MCG TOTAL DAILY)   levothyroxine (SYNTHROID) 50 MCG tablet TAKE 1 TABLET BY MOUTH EVERY DAY (Patient taking differently: Take 50 mcg by mouth daily before breakfast.)   loratadine (CLARITIN) 10 MG tablet Take 10 mg by mouth daily.   mupirocin ointment (BACTROBAN) 2 % APPLY TO AFFECTED AREA ON THE SKIN 2 TIMES A DAY (Patient taking differently: Apply 1 Application topically 2 (two) times daily as needed (skin irritation).)   nystatin (MYCOSTATIN/NYSTOP) powder Apply 1 Application topically 2 (two) times daily. (Patient taking differently: Apply 1 Application topically 2 (two) times daily. Apply to groin area)   ondansetron (ZOFRAN) 4 MG tablet Take 1 tablet (4 mg total) by mouth 2 (two) times daily. (Patient taking differently: Take 4 mg by  mouth 2 (two) times daily. Give 1 tablet in the morning before breakfast and 1 tablet before supper)   Spacer/Aero-Holding Chambers (AEROCHAMBER PLUS WITH MASK) inhaler Use with Stiolto inhalations.   STIOLTO RESPIMAT 2.5-2.5 MCG/ACT AERS INHALE 2 PUFFS INTO THE LUNGS DAILY. (Patient taking differently: Inhale 2 puffs into the lungs at bedtime.)   [DISCONTINUED] amLODipine  (NORVASC) 2.5 MG tablet Take 1 tablet (2.5 mg total) by mouth daily. (Patient taking differently: Take 2.5 mg by mouth every evening.)   [DISCONTINUED] Calcium Carbonate-Vit D-Min (CALCIUM 1200 PO) Take 1 tablet by mouth daily. (Patient not taking: Reported on 03/10/2022)   [DISCONTINUED] Cranberry 500 MG TABS Take 2 tablets by mouth 3 (three) times a week. (Patient not taking: Reported on 03/10/2022)   [DISCONTINUED] FLUoxetine (PROZAC) 10 MG capsule TAKE 1 CAPSULE BY MOUTH DAILY.   [DISCONTINUED] furosemide (LASIX) 20 MG tablet TAKE 1 TABLET BY MOUTH DAILY.   [DISCONTINUED] mupirocin ointment (BACTROBAN) 2 % Apply 1 Application topically daily. With dressing changes (Patient taking differently: Apply 1 Application topically daily as needed (rash). With dressing changes)   [DISCONTINUED] pantoprazole (PROTONIX) 40 MG tablet Take 1 tablet (40 mg total) by mouth daily.   [DISCONTINUED] Polyvinyl Alcohol-Povidone PF (REFRESH) 1.4-0.6 % SOLN Apply 1 drop to eye 2 (two) times daily as needed (dry eyes). (Patient not taking: Reported on 03/10/2022)   [DISCONTINUED] potassium chloride SA (KLOR-CON M) 20 MEQ tablet Take 1 tablet (20 mEq total) by mouth daily.   [DISCONTINUED] risperiDONE (RISPERDAL) 1 MG tablet TAKE 1 TABLET BY MOUTH 2 TIMES DAILY. (Patient taking differently: Take 1 mg by mouth at bedtime.)   [DISCONTINUED] triamcinolone cream (KENALOG) 0.1 % Apply 1 application topically 2 (two) times daily. Request jar (Patient not taking: Reported on 03/10/2022)   No facility-administered medications prior to visit.    Review of Systems See HPI      Objective     Today's Vitals   03/08/22 1016  BP: 126/71  Pulse: 100  SpO2: 92%  Weight: 131 lb (59.4 kg)  Height: 5\' 3"  (1.6 m)   Body mass index is 23.21 kg/m.   Physical Exam Vitals and nursing note reviewed.  Constitutional:      Appearance: Normal appearance. She is well-developed.  HENT:     Head: Normocephalic and atraumatic.      Nose: Nose normal.     Mouth/Throat:     Mouth: Mucous membranes are moist.     Pharynx: Oropharynx is clear.  Eyes:     Extraocular Movements: Extraocular movements intact.     Conjunctiva/sclera: Conjunctivae normal.     Pupils: Pupils are equal, round, and reactive to light.  Neck:     Vascular: No carotid bruit.  Cardiovascular:     Rate and Rhythm: Normal rate and regular rhythm.     Pulses: Normal pulses.     Heart sounds: Normal heart sounds.  Pulmonary:     Effort: Pulmonary effort is normal.     Breath sounds: Normal breath sounds.  Abdominal:     Palpations: Abdomen is soft.  Musculoskeletal:        General: Normal range of motion.     Cervical back: Normal range of motion and neck supple.     Right lower leg: Edema present.     Left lower leg: Edema present.     Comments: Pedal edema which is non pitting. Slightly tender   Lymphadenopathy:     Cervical: No cervical adenopathy.  Skin:  General: Skin is warm and dry.     Capillary Refill: Capillary refill takes less than 2 seconds.  Neurological:     General: No focal deficit present.     Mental Status: She is alert and oriented to person, place, and time.  Psychiatric:        Mood and Affect: Mood normal.        Behavior: Behavior normal.        Thought Content: Thought content normal.        Judgment: Judgment normal.     Results for orders placed or performed in visit on 40/98/11  Basic Metabolic Panel (BMET)  Result Value Ref Range   Glucose 118 (H) 70 - 99 mg/dL   BUN 10 10 - 36 mg/dL   Creatinine, Ser 1.09 (H) 0.57 - 1.00 mg/dL   eGFR 47 (L) >59 mL/min/1.73   BUN/Creatinine Ratio 9 (L) 12 - 28   Sodium 135 134 - 144 mmol/L   Potassium 4.5 3.5 - 5.2 mmol/L   Chloride 97 96 - 106 mmol/L   CO2 21 20 - 29 mmol/L   Calcium 9.7 8.7 - 10.3 mg/dL  B Nat Peptide  Result Value Ref Range   BNP 637.9 (H) 0.0 - 100.0 pg/mL    Assessment & Plan     1. Generalized edema Patient may take 2nd dose of  furosemide in early afternoon as needed. Check BMP, Bnp, and Ck/CKMB - Basic Metabolic Panel (BMET) - B Nat Peptide - CK total and CKMB (cardiac)not at [facility]; Future  2. History of atrial fibrillation Histry of A-fib with CHF. Check BNP and CK/CKMB. Send t cardiology as indicated  - B Nat Peptide - CK total and CKMB (cardiac)not at [facility]; Future  3. Essential hypertension Stable. Continue bp medication as prescribed  4. Acquired hypothyroidism Continue levothyroxine as prescribed    Return for as scheduled.        Ronnell Freshwater, NP  [facility] [phone] (phone) [phone] (fax)  [place]

## 2022-03-09 LAB — BASIC METABOLIC PANEL
BUN/Creatinine Ratio: 9 — ABNORMAL LOW (ref 12–28)
BUN: 10 mg/dL (ref 10–36)
CO2: 21 mmol/L (ref 20–29)
Calcium: 9.7 mg/dL (ref 8.7–10.3)
Chloride: 97 mmol/L (ref 96–106)
Creatinine, Ser: 1.09 mg/dL — ABNORMAL HIGH (ref 0.57–1.00)
Glucose: 118 mg/dL — ABNORMAL HIGH (ref 70–99)
Potassium: 4.5 mmol/L (ref 3.5–5.2)
Sodium: 135 mmol/L (ref 134–144)
eGFR: 47 mL/min/{1.73_m2} — ABNORMAL LOW (ref >59)

## 2022-03-09 LAB — BRAIN NATRIURETIC PEPTIDE: BNP: 637.9 pg/mL — ABNORMAL HIGH (ref 0.0–100.0)

## 2022-03-10 ENCOUNTER — Emergency Department (HOSPITAL_COMMUNITY): Payer: Medicare HMO

## 2022-03-10 ENCOUNTER — Other Ambulatory Visit: Payer: Self-pay

## 2022-03-10 ENCOUNTER — Encounter (HOSPITAL_COMMUNITY): Payer: Self-pay

## 2022-03-10 ENCOUNTER — Inpatient Hospital Stay (HOSPITAL_COMMUNITY)
Admission: EM | Admit: 2022-03-10 | Discharge: 2022-03-15 | DRG: 291 | Disposition: A | Discharge status: Hospice | Payer: Medicare HMO | Attending: Internal Medicine | Admitting: Internal Medicine

## 2022-03-10 DIAGNOSIS — J9811 Atelectasis: Secondary | ICD-10-CM | POA: Diagnosis not present

## 2022-03-10 DIAGNOSIS — I4819 Other persistent atrial fibrillation: Secondary | ICD-10-CM | POA: Diagnosis not present

## 2022-03-10 DIAGNOSIS — M7989 Other specified soft tissue disorders: Secondary | ICD-10-CM | POA: Diagnosis present

## 2022-03-10 DIAGNOSIS — H109 Unspecified conjunctivitis: Secondary | ICD-10-CM | POA: Diagnosis present

## 2022-03-10 DIAGNOSIS — F419 Anxiety disorder, unspecified: Secondary | ICD-10-CM

## 2022-03-10 DIAGNOSIS — Z760 Encounter for issue of repeat prescription: Secondary | ICD-10-CM

## 2022-03-10 DIAGNOSIS — M810 Age-related osteoporosis without current pathological fracture: Secondary | ICD-10-CM | POA: Diagnosis present

## 2022-03-10 DIAGNOSIS — Z87891 Personal history of nicotine dependence: Secondary | ICD-10-CM

## 2022-03-10 DIAGNOSIS — I5021 Acute systolic (congestive) heart failure: Secondary | ICD-10-CM | POA: Diagnosis present

## 2022-03-10 DIAGNOSIS — K219 Gastro-esophageal reflux disease without esophagitis: Secondary | ICD-10-CM | POA: Diagnosis not present

## 2022-03-10 DIAGNOSIS — F0394 Unspecified dementia, unspecified severity, with anxiety: Secondary | ICD-10-CM | POA: Diagnosis present

## 2022-03-10 DIAGNOSIS — J309 Allergic rhinitis, unspecified: Secondary | ICD-10-CM | POA: Diagnosis present

## 2022-03-10 DIAGNOSIS — E782 Mixed hyperlipidemia: Secondary | ICD-10-CM | POA: Diagnosis not present

## 2022-03-10 DIAGNOSIS — Z8249 Family history of ischemic heart disease and other diseases of the circulatory system: Secondary | ICD-10-CM

## 2022-03-10 DIAGNOSIS — F03918 Unspecified dementia, unspecified severity, with other behavioral disturbance: Secondary | ICD-10-CM | POA: Diagnosis present

## 2022-03-10 DIAGNOSIS — R109 Unspecified abdominal pain: Secondary | ICD-10-CM | POA: Diagnosis present

## 2022-03-10 DIAGNOSIS — R531 Weakness: Secondary | ICD-10-CM | POA: Diagnosis not present

## 2022-03-10 DIAGNOSIS — E039 Hypothyroidism, unspecified: Secondary | ICD-10-CM | POA: Diagnosis present

## 2022-03-10 DIAGNOSIS — Z7982 Long term (current) use of aspirin: Secondary | ICD-10-CM

## 2022-03-10 DIAGNOSIS — Z7951 Long term (current) use of inhaled steroids: Secondary | ICD-10-CM

## 2022-03-10 DIAGNOSIS — Z7401 Bed confinement status: Secondary | ICD-10-CM | POA: Diagnosis not present

## 2022-03-10 DIAGNOSIS — E871 Hypo-osmolality and hyponatremia: Secondary | ICD-10-CM | POA: Diagnosis present

## 2022-03-10 DIAGNOSIS — Z823 Family history of stroke: Secondary | ICD-10-CM

## 2022-03-10 DIAGNOSIS — J90 Pleural effusion, not elsewhere classified: Secondary | ICD-10-CM | POA: Diagnosis not present

## 2022-03-10 DIAGNOSIS — R0602 Shortness of breath: Principal | ICD-10-CM

## 2022-03-10 DIAGNOSIS — N179 Acute kidney failure, unspecified: Secondary | ICD-10-CM | POA: Diagnosis not present

## 2022-03-10 DIAGNOSIS — Z515 Encounter for palliative care: Secondary | ICD-10-CM | POA: Diagnosis not present

## 2022-03-10 DIAGNOSIS — I509 Heart failure, unspecified: Secondary | ICD-10-CM

## 2022-03-10 DIAGNOSIS — Z885 Allergy status to narcotic agent status: Secondary | ICD-10-CM

## 2022-03-10 DIAGNOSIS — I700 Atherosclerosis of aorta: Secondary | ICD-10-CM | POA: Diagnosis not present

## 2022-03-10 DIAGNOSIS — Z85828 Personal history of other malignant neoplasm of skin: Secondary | ICD-10-CM

## 2022-03-10 DIAGNOSIS — M545 Low back pain, unspecified: Secondary | ICD-10-CM | POA: Diagnosis present

## 2022-03-10 DIAGNOSIS — I2510 Atherosclerotic heart disease of native coronary artery without angina pectoris: Secondary | ICD-10-CM | POA: Diagnosis present

## 2022-03-10 DIAGNOSIS — N1832 Chronic kidney disease, stage 3b: Secondary | ICD-10-CM | POA: Diagnosis not present

## 2022-03-10 DIAGNOSIS — E8809 Other disorders of plasma-protein metabolism, not elsewhere classified: Secondary | ICD-10-CM | POA: Diagnosis present

## 2022-03-10 DIAGNOSIS — R079 Chest pain, unspecified: Secondary | ICD-10-CM | POA: Diagnosis not present

## 2022-03-10 DIAGNOSIS — H353112 Nonexudative age-related macular degeneration, right eye, intermediate dry stage: Secondary | ICD-10-CM | POA: Diagnosis present

## 2022-03-10 DIAGNOSIS — I252 Old myocardial infarction: Secondary | ICD-10-CM

## 2022-03-10 DIAGNOSIS — H353231 Exudative age-related macular degeneration, bilateral, with active choroidal neovascularization: Secondary | ICD-10-CM | POA: Diagnosis present

## 2022-03-10 DIAGNOSIS — E785 Hyperlipidemia, unspecified: Secondary | ICD-10-CM | POA: Diagnosis present

## 2022-03-10 DIAGNOSIS — I130 Hypertensive heart and chronic kidney disease with heart failure and stage 1 through stage 4 chronic kidney disease, or unspecified chronic kidney disease: Principal | ICD-10-CM | POA: Diagnosis present

## 2022-03-10 DIAGNOSIS — J449 Chronic obstructive pulmonary disease, unspecified: Secondary | ICD-10-CM | POA: Diagnosis present

## 2022-03-10 DIAGNOSIS — G8929 Other chronic pain: Secondary | ICD-10-CM | POA: Diagnosis present

## 2022-03-10 DIAGNOSIS — Z79899 Other long term (current) drug therapy: Secondary | ICD-10-CM

## 2022-03-10 DIAGNOSIS — I10 Essential (primary) hypertension: Secondary | ICD-10-CM | POA: Diagnosis present

## 2022-03-10 DIAGNOSIS — Z8673 Personal history of transient ischemic attack (TIA), and cerebral infarction without residual deficits: Secondary | ICD-10-CM

## 2022-03-10 DIAGNOSIS — Z888 Allergy status to other drugs, medicaments and biological substances status: Secondary | ICD-10-CM

## 2022-03-10 DIAGNOSIS — Z66 Do not resuscitate: Secondary | ICD-10-CM | POA: Diagnosis not present

## 2022-03-10 DIAGNOSIS — R001 Bradycardia, unspecified: Secondary | ICD-10-CM | POA: Diagnosis not present

## 2022-03-10 DIAGNOSIS — R062 Wheezing: Secondary | ICD-10-CM | POA: Diagnosis not present

## 2022-03-10 DIAGNOSIS — Z79890 Hormone replacement therapy (postmenopausal): Secondary | ICD-10-CM

## 2022-03-10 LAB — CBC WITH DIFFERENTIAL/PLATELET
Abs Immature Granulocytes: 0.04 10*3/uL (ref 0.00–0.07)
Basophils Absolute: 0 10*3/uL (ref 0.0–0.1)
Basophils Relative: 0 %
Eosinophils Absolute: 0 10*3/uL (ref 0.0–0.5)
Eosinophils Relative: 1 %
HCT: 35.8 % — ABNORMAL LOW (ref 36.0–46.0)
Hemoglobin: 11.6 g/dL — ABNORMAL LOW (ref 12.0–15.0)
Immature Granulocytes: 1 %
Lymphocytes Relative: 8 %
Lymphs Abs: 0.6 10*3/uL — ABNORMAL LOW (ref 0.7–4.0)
MCH: 29.7 pg (ref 26.0–34.0)
MCHC: 32.4 g/dL (ref 30.0–36.0)
MCV: 91.8 fL (ref 80.0–100.0)
Monocytes Absolute: 0.6 10*3/uL (ref 0.1–1.0)
Monocytes Relative: 7 %
Neutro Abs: 6.2 10*3/uL (ref 1.7–7.7)
Neutrophils Relative %: 83 %
Platelets: 168 10*3/uL (ref 150–400)
RBC: 3.9 MIL/uL (ref 3.87–5.11)
RDW: 15.9 % — ABNORMAL HIGH (ref 11.5–15.5)
WBC: 7.5 10*3/uL (ref 4.0–10.5)
nRBC: 0 % (ref 0.0–0.2)

## 2022-03-10 LAB — BRAIN NATRIURETIC PEPTIDE: B Natriuretic Peptide: 997.4 pg/mL — ABNORMAL HIGH (ref 0.0–100.0)

## 2022-03-10 LAB — COMPREHENSIVE METABOLIC PANEL
ALT: 14 U/L (ref 0–44)
AST: 25 U/L (ref 15–41)
Albumin: 2.9 g/dL — ABNORMAL LOW (ref 3.5–5.0)
Alkaline Phosphatase: 72 U/L (ref 38–126)
Anion gap: 12 (ref 5–15)
BUN: 9 mg/dL (ref 8–23)
CO2: 23 mmol/L (ref 22–32)
Calcium: 8.8 mg/dL — ABNORMAL LOW (ref 8.9–10.3)
Chloride: 94 mmol/L — ABNORMAL LOW (ref 98–111)
Creatinine, Ser: 1.24 mg/dL — ABNORMAL HIGH (ref 0.44–1.00)
GFR, Estimated: 41 mL/min — ABNORMAL LOW (ref >60)
Glucose, Bld: 102 mg/dL — ABNORMAL HIGH (ref 70–99)
Potassium: 3.8 mmol/L (ref 3.5–5.1)
Sodium: 129 mmol/L — ABNORMAL LOW (ref 135–145)
Total Bilirubin: 1.2 mg/dL (ref 0.3–1.2)
Total Protein: 5.7 g/dL — ABNORMAL LOW (ref 6.5–8.1)

## 2022-03-10 LAB — URINALYSIS, ROUTINE W REFLEX MICROSCOPIC
Bacteria, UA: NONE SEEN
Bilirubin Urine: NEGATIVE
Glucose, UA: NEGATIVE mg/dL
Ketones, ur: NEGATIVE mg/dL
Leukocytes,Ua: NEGATIVE
Nitrite: NEGATIVE
Protein, ur: NEGATIVE mg/dL
Specific Gravity, Urine: 1.01 (ref 1.005–1.030)
pH: 5 (ref 5.0–8.0)

## 2022-03-10 LAB — TROPONIN I (HIGH SENSITIVITY)
Troponin I (High Sensitivity): 22 ng/L — ABNORMAL HIGH (ref <18)
Troponin I (High Sensitivity): 19 ng/L — ABNORMAL HIGH (ref <18)

## 2022-03-10 LAB — LIPASE, BLOOD: Lipase: 27 U/L (ref 11–51)

## 2022-03-10 MED ORDER — FUROSEMIDE 10 MG/ML IJ SOLN
40.0000 mg | Freq: Once | INTRAMUSCULAR | Status: AC
  Administered 2022-03-10: 40 mg via INTRAVENOUS
  Filled 2022-03-10: qty 4

## 2022-03-10 MED ORDER — SIMETHICONE 80 MG PO CHEW
80.0000 mg | CHEWABLE_TABLET | Freq: Every day | ORAL | Status: DC
  Administered 2022-03-11 – 2022-03-15 (×5): 80 mg via ORAL
  Filled 2022-03-10 (×5): qty 1

## 2022-03-10 MED ORDER — ALBUTEROL SULFATE (2.5 MG/3ML) 0.083% IN NEBU: 2.5000 mg | INHALATION_SOLUTION | Freq: Four times a day (QID) | RESPIRATORY_TRACT | Status: DC | PRN

## 2022-03-10 MED ORDER — ASPIRIN 81 MG PO CHEW
81.0000 mg | CHEWABLE_TABLET | Freq: Every evening | ORAL | Status: DC
  Administered 2022-03-10 – 2022-03-14 (×5): 81 mg via ORAL
  Filled 2022-03-10 (×5): qty 1

## 2022-03-10 MED ORDER — ATORVASTATIN CALCIUM 10 MG PO TABS
10.0000 mg | ORAL_TABLET | Freq: Every day | ORAL | Status: DC
  Administered 2022-03-11 – 2022-03-15 (×5): 10 mg via ORAL
  Filled 2022-03-10 (×5): qty 1

## 2022-03-10 MED ORDER — POLYVINYL ALCOHOL-POVIDONE PF 1.4-0.6 % OP SOLN: 1.0000 [drp] | Freq: Two times a day (BID) | OPHTHALMIC | Status: DC | PRN

## 2022-03-10 MED ORDER — FAMOTIDINE 20 MG PO TABS: 10.0000 mg | ORAL_TABLET | Freq: Two times a day (BID) | ORAL | Status: DC | PRN

## 2022-03-10 MED ORDER — RISPERIDONE 1 MG PO TABS
1.0000 mg | ORAL_TABLET | Freq: Two times a day (BID) | ORAL | Status: DC
  Administered 2022-03-10 – 2022-03-15 (×10): 1 mg via ORAL
  Filled 2022-03-10 (×12): qty 1

## 2022-03-10 MED ORDER — POLYVINYL ALCOHOL 1.4 % OP SOLN: 1.0000 [drp] | OPHTHALMIC | Status: DC | PRN

## 2022-03-10 MED ORDER — SODIUM CHLORIDE 0.9% FLUSH
3.0000 mL | Freq: Two times a day (BID) | INTRAVENOUS | Status: DC
  Administered 2022-03-10 – 2022-03-15 (×10): 3 mL via INTRAVENOUS

## 2022-03-10 MED ORDER — MAGNESIUM OXIDE -MG SUPPLEMENT 400 (240 MG) MG PO TABS
800.0000 mg | ORAL_TABLET | Freq: Once | ORAL | Status: AC
  Administered 2022-03-10: 800 mg via ORAL
  Filled 2022-03-10: qty 2

## 2022-03-10 MED ORDER — ACETAMINOPHEN 650 MG RE SUPP: 650.0000 mg | Freq: Four times a day (QID) | RECTAL | Status: DC | PRN

## 2022-03-10 MED ORDER — IOHEXOL 350 MG/ML SOLN
65.0000 mL | Freq: Once | INTRAVENOUS | Status: AC | PRN
  Administered 2022-03-10: 65 mL via INTRAVENOUS

## 2022-03-10 MED ORDER — FUROSEMIDE 10 MG/ML IJ SOLN
60.0000 mg | Freq: Two times a day (BID) | INTRAMUSCULAR | Status: DC
  Administered 2022-03-11: 60 mg via INTRAVENOUS
  Filled 2022-03-10: qty 6

## 2022-03-10 MED ORDER — POTASSIUM CHLORIDE CRYS ER 20 MEQ PO TBCR
40.0000 meq | EXTENDED_RELEASE_TABLET | Freq: Once | ORAL | Status: AC
  Administered 2022-03-10: 40 meq via ORAL
  Filled 2022-03-10: qty 2

## 2022-03-10 MED ORDER — BUDESONIDE 0.25 MG/2ML IN SUSP
0.2500 mg | Freq: Two times a day (BID) | RESPIRATORY_TRACT | Status: DC
  Administered 2022-03-10 – 2022-03-15 (×10): 0.25 mg via RESPIRATORY_TRACT
  Filled 2022-03-10 (×9): qty 2

## 2022-03-10 MED ORDER — ENOXAPARIN SODIUM 30 MG/0.3ML IJ SOSY
30.0000 mg | PREFILLED_SYRINGE | INTRAMUSCULAR | Status: DC
  Administered 2022-03-10 – 2022-03-14 (×5): 30 mg via SUBCUTANEOUS
  Filled 2022-03-10 (×5): qty 0.3

## 2022-03-10 MED ORDER — ONDANSETRON HCL 4 MG PO TABS
4.0000 mg | ORAL_TABLET | Freq: Two times a day (BID) | ORAL | Status: DC
  Administered 2022-03-10 – 2022-03-15 (×10): 4 mg via ORAL
  Filled 2022-03-10 (×10): qty 1

## 2022-03-10 MED ORDER — POLYETHYLENE GLYCOL 3350 17 G PO PACK
17.0000 g | PACK | Freq: Every day | ORAL | Status: DC | PRN
  Administered 2022-03-11: 17 g via ORAL
  Filled 2022-03-10: qty 1

## 2022-03-10 MED ORDER — FLUOXETINE HCL 10 MG PO CAPS
10.0000 mg | ORAL_CAPSULE | Freq: Every day | ORAL | Status: DC
  Administered 2022-03-11 – 2022-03-15 (×5): 10 mg via ORAL
  Filled 2022-03-10 (×5): qty 1

## 2022-03-10 MED ORDER — ACETAMINOPHEN 325 MG PO TABS: 650.0000 mg | ORAL_TABLET | Freq: Four times a day (QID) | ORAL | Status: DC | PRN

## 2022-03-10 MED ORDER — ARFORMOTEROL TARTRATE 15 MCG/2ML IN NEBU
15.0000 ug | INHALATION_SOLUTION | Freq: Two times a day (BID) | RESPIRATORY_TRACT | Status: DC
  Administered 2022-03-10 – 2022-03-15 (×10): 15 ug via RESPIRATORY_TRACT
  Filled 2022-03-10 (×9): qty 2

## 2022-03-10 MED ORDER — ERYTHROMYCIN 5 MG/GM OP OINT
1.0000 | TOPICAL_OINTMENT | Freq: Every day | OPHTHALMIC | Status: DC
  Administered 2022-03-10 – 2022-03-14 (×5): 1 via OPHTHALMIC
  Filled 2022-03-10: qty 3.5

## 2022-03-10 MED ORDER — UMECLIDINIUM BROMIDE 62.5 MCG/ACT IN AEPB
1.0000 | INHALATION_SPRAY | Freq: Every day | RESPIRATORY_TRACT | Status: DC
  Administered 2022-03-10 – 2022-03-15 (×6): 1 via RESPIRATORY_TRACT
  Filled 2022-03-10: qty 7

## 2022-03-10 MED ORDER — PANTOPRAZOLE SODIUM 40 MG PO TBEC
40.0000 mg | DELAYED_RELEASE_TABLET | Freq: Every day | ORAL | Status: DC
  Administered 2022-03-11 – 2022-03-15 (×5): 40 mg via ORAL
  Filled 2022-03-10 (×5): qty 1

## 2022-03-10 MED ORDER — LEVOTHYROXINE SODIUM 50 MCG PO TABS
62.5000 ug | ORAL_TABLET | Freq: Every day | ORAL | Status: DC
  Administered 2022-03-11 – 2022-03-15 (×5): 62.5 ug via ORAL
  Filled 2022-03-10 (×5): qty 1

## 2022-03-10 MED ORDER — SODIUM CHLORIDE 1 G PO TABS
1.0000 g | ORAL_TABLET | Freq: Once | ORAL | Status: AC
  Administered 2022-03-10: 1 g via ORAL
  Filled 2022-03-10: qty 1

## 2022-03-10 MED ORDER — ALBUMIN HUMAN 25 % IV SOLN
25.0000 g | Freq: Once | INTRAVENOUS | Status: AC
  Administered 2022-03-10: 25 g via INTRAVENOUS
  Filled 2022-03-10: qty 100

## 2022-03-10 NOTE — ED Notes (Signed)
ED TO INPATIENT HANDOFF REPORT  ED Nurse Name and Phone #: [phone]   S Name/Age/Gender Donna Arias 89 y.o. female Room/Bed: 026C/026C  Code Status   Code Status: DNR  Home/SNF/Other Home Patient oriented to: self, place, time, and situation Is this baseline? Yes   Triage Complete: Triage complete  Chief Complaint Acute CHF [facility]) [I50.9]  Triage Note Shortness of breath intermittently, unable to perform normal tasks today. Expiratory wheeze. Hx of COPD. Was 88% on room air, up to 94% after 69m albuterol. Family concerned about fluid build up across lower back.   Allergies Allergies  Allergen Reactions   Picato [Ingenol Mebutate] Hives   Carrot [Daucus Carota] Other (See Comments)    Unable to eat a lot of it due to eye condition per MD   Codeine Sulfate Other (See Comments)    "Made her drunk" per daughter at the bedside    Level of Care/Admitting Diagnosis ED Disposition     ED Dis[facility]  [facility][[id number]]  Level of Care: Telemetry Cardiac [[id number]]  May [facility] patient to [facility] equivalent level of care is available:: No  Covid Evaluation: Asymptomatic - no recent exposure (last 10 days) testing not required  Diagnosis: Acute CHF ([facility] [[id number] Admitting Physician: MMarcelyn Bruins[[id number] Attending Physician: MMarcelyn Bruins[[id number] Certification:: I certify this patient will need inpatient services for at least 2 midnights  Estimated Length of Stay: 3          B Medical/Surgery History Past Medical History:  Diagnosis Date   Chronic headaches    [place]    D[place]    Eye infection, left 01/16/2018   GERD (gastroesophageal reflux disease)    [place]   HLD (hyperlipidemia)    Hypertension    Skin cancer    squamous cell of left face   Squamous cell carcinoma of skin  11/03/2021   L zygoma - poorly differentiated   Stroke ([place]    Thyroid disease    Past Surgical History:  Procedure Laterality Date   BREAST LUMPECTOMY Left [place]    A IV Location/Drains/Wounds Patient Lines/Drains/Airways Status     Active Line/Drains/Airways     Name Placement date Placement time Site Days   Peripheral IV 03/10/22 20 G Left;Posterior Hand 03/10/22  1231  Hand  less than 1   Peripheral IV 03/10/22 20 G Left Antecubital 03/10/22  1332  Antecubital  less than 1            Intake/Output Last 24 hours No intake or output data in the 24 hours ending 03/10/22 2200  Labs/Imaging Results for orders placed or performed during the hospital encounter of 03/10/22 (from the past 48 hour(s))  Comprehensive metabolic panel     Status: Abnormal   Collection Time: 03/10/22 11:40 AM  Result Value Ref [place]   Sodium 129 (L) 135 - 145 mmol/L   Potassium 3.8 3.5 - 5.1 mmol/L   Chloride 94 (L) 98 - 111 mmol/L   CO2 23 22 - 32 mmol/L   Glucose, Bld 102 (H) 70 - 99 mg/dL    Comment: Glucose reference range applies only to samples taken after fasting for at least 8 hours.   BUN 9 8 -  23 mg/dL   Creatinine, Ser 1.24 (H) 0.44 - 1.00 mg/dL   Calcium 8.8 (L) 8.9 - 10.3 mg/dL   Total Protein 5.7 (L) 6.5 - 8.1 g/dL   Albumin 2.9 (L) 3.5 - 5.0 g/dL   AST 25 15 - 41 U/L   ALT 14 0 - 44 U/L   Alkaline Phosphatase 72 38 - 126 U/L   Total Bilirubin 1.2 0.3 - 1.2 mg/dL   GFR, Estimated 41 (L) >60 mL/min    Comment: (NOTE) Calculated using the CKD-EPI Creatinine Equation (2021)    Anion gap 12 5 - 15    Comment: Performed at [facility], [place] [address] [place], [place] [address]  Troponin I (High Sensitivity)     Status: Abnormal   Collection Time: 03/10/22 11:40 AM  Result Value Ref Range   Troponin I (High Sensitivity) 22 (H) <18 ng/L    Comment: (NOTE) Elevated high sensitivity troponin I  (hsTnI) values and significant  changes across serial measurements may suggest ACS but many other  chronic and acute conditions are known to elevate hsTnI results.  Refer to the "Links" section for chest pain algorithms and additional  guidance. Performed at [facility], [place] [address] [place], [place] [address]   CBC with Differential     Status: Abnormal   Collection Time: 03/10/22 11:40 AM  Result Value Ref Range   WBC 7.5 4.0 - 10.5 K/uL   RBC 3.90 3.87 - 5.11 MIL/uL   Hemoglobin 11.6 (L) 12.0 - 15.0 g/dL   HCT 35.8 (L) 36.0 - 46.0 %   MCV 91.8 80.0 - 100.0 fL   MCH 29.7 26.0 - 34.0 pg   MCHC 32.4 30.0 - 36.0 g/dL   RDW 15.9 (H) 11.5 - 15.5 %   Platelets 168 150 - 400 K/uL   nRBC 0.0 0.0 - 0.2 %   Neutrophils Relative % 83 %   Neutro Abs 6.2 1.7 - 7.7 K/uL   Lymphocytes Relative 8 %   Lymphs Abs 0.6 (L) 0.7 - 4.0 K/uL   Monocytes Relative 7 %   Monocytes Absolute 0.6 0.1 - 1.0 K/uL   Eosinophils Relative 1 %   Eosinophils Absolute 0.0 0.0 - 0.5 K/uL   Basophils Relative 0 %   Basophils Absolute 0.0 0.0 - 0.1 K/uL   Immature Granulocytes 1 %   Abs Immature Granulocytes 0.04 0.00 - 0.07 K/uL    Comment: Performed at [facility], [place] [address] [place], [place] [address]  Brain natriuretic peptide     Status: Abnormal   Collection Time: 03/10/22 11:40 AM  Result Value Ref Range   B Natriuretic Peptide 997.4 (H) 0.0 - 100.0 pg/mL    Comment: Performed at [facility] [address] [place], [place] [address]  Lipase, blood     Status: None   Collection Time: 03/10/22 11:40 AM  Result Value Ref Range   Lipase 27 11 - 51 U/L    Comment: Performed at [facility] [address], [place], [place] [address]  Troponin I (High Sensitivity)     Status: Abnormal   Collection Time: 03/10/22  1:16 PM  Result Value Ref Range   Troponin I (High Sensitivity) 19 (H) <18 ng/L    Comment: (NOTE) Elevated high sensitivity troponin I (hsTnI) values and  significant  changes across serial measurements may suggest ACS but many other  chronic and acute conditions are known to elevate hsTnI results.  Refer to the "Links" section for chest pain algorithms and additional  guidance. Performed at [facility], [place] [address], [place], [place] [address]   Urinalysis, Routine w reflex microscopic -Urine, Clean Catch     Status: Abnormal   Collection Time: 03/10/22  4:55 PM  Result Value Ref Range   Color, Urine STRAW (A) YELLOW   APPearance CLEAR CLEAR   Specific Gravity, Urine 1.010 1.005 - 1.030   pH 5.0 5.0 - 8.0   Glucose, UA NEGATIVE NEGATIVE mg/dL   Hgb urine dipstick SMALL (A) NEGATIVE   Bilirubin Urine NEGATIVE NEGATIVE   Ketones, ur NEGATIVE NEGATIVE mg/dL   Protein, ur NEGATIVE NEGATIVE mg/dL   Nitrite NEGATIVE NEGATIVE   Leukocytes,Ua NEGATIVE NEGATIVE   RBC / HPF 0-5 0 - 5 RBC/hpf   WBC, UA 0-5 0 - 5 WBC/hpf   Bacteria, UA NONE SEEN NONE SEEN   Squamous Epithelial / HPF 0-5 0 - 5 /HPF    Comment: Performed at [facility], [place] [address] [place], [place] [address]   CT Angio Chest PE W and/or Wo Contrast  Result Date: 03/10/2022 CLINICAL DATA:  Chest pain and right lower quadrant abdominal pain. EXAM: CT ANGIOGRAPHY CHEST CT ABDOMEN AND PELVIS WITH CONTRAST TECHNIQUE: Multidetector CT imaging of the chest was performed using the standard protocol during bolus administration of intravenous contrast. Multiplanar CT image reconstructions and MIPs were obtained to evaluate the vascular anatomy. Multidetector CT imaging of the abdomen and pelvis was performed using the standard protocol during bolus administration of intravenous contrast. RADIATION DOSE REDUCTION: This exam was performed according to the departmental dose-optimization program which includes automated exposure control, adjustment of the mA and/or kV according to patient size and/or use of iterative reconstruction technique. CONTRAST:  22m OMNIPAQUE IOHEXOL  350 MG/ML SOLN COMPARISON:  CT abdomen/pelvis 01/09/2022 and prior chest CT 06/07/2020 FINDINGS: CTA CHEST FINDINGS Cardiovascular: The heart is enlarged with four-chamber enlargement. No pericardial effusion. There is moderate tortuosity and calcification of the thoracic aorta but no aneurysm or dissection. The pulmonary arterial tree is fairly well opacified. The main pulmonary arteries are enlarged which may suggest pulmonary hypertension. No filling defects to suggest pulmonary embolism. Mediastinum/Nodes: Borderline enlarged mediastinal and hilar lymph nodes likely due to the underlying lung findings. The esophagus is grossly normal. Lungs/Pleura: Large bilateral pleural effusions. With mild overlying right lower lobe atelectasis and moderate left lower lobe atelectasis. Hazy interstitial changes and ground-glass opacity likely reflecting asymmetric pulmonary edema. No pulmonary lesions. Musculoskeletal: No significant bony findings. Review of the MIP images confirms the above findings. CT ABDOMEN and PELVIS FINDINGS Hepatobiliary: No hepatic lesions or intrahepatic biliary dilatation. The gallbladder is surgically absent. No common bile duct dilatation. Pancreas: No mass, inflammation ductal dilatation. Moderate age related pancreatic atrophy. Spleen: Normal size.  No focal lesions. Adrenals/Urinary Tract: The adrenal glands are normal. Mild renal cortical thinning but no renal lesions or hydronephrosis. No collecting system abnormalities on the delayed images. The bladder is. Stomach/Bowel: The stomach, duodenum, small and colon are unremarkable. No acute inflammatory process, mass lesions obstructive findings. The terminal ileum and appendix are normal. Scattered colonic diverticulosis but no findings for acute diverticulitis. Vascular/Lymphatic: Advanced atherosclerotic calcification involving the aorta and branch vessels but no aneurysm dissection. The major venous structures are patent. No mesenteric or  retroperitoneal mass or adenopathy. There is a stable rim calcified density in the left upper pelvis adjacent to the descending colon which could be an area of old fat  necrosis or calcified lymph node. Reproductive: The uterus and ovaries are unremarkable. Other: Small amount of free pelvic fluid is noted. There is also mild diffuse body wall edema. Musculoskeletal: No significant bony findings. Review of the MIP images confirms the above findings. IMPRESSION: 1. No CT findings for pulmonary embolism. 2. Cardiac enlargement. 3. Large bilateral pleural effusions with overlying right lower lobe atelectasis and moderate left lower lobe atelectasis. 4. Hazy interstitial changes and ground-glass opacity likely reflecting asymmetric pulmonary edema. 5. No acute abdominal/pelvic findings, mass lesions or adenopathy. 6. Advanced atherosclerotic calcification involving the thoracic and abdominal aorta and branch vessels. 7. Aortic atherosclerosis. Aortic Atherosclerosis (ICD10-I70.0). Electronically Signed   By: Marijo Sanes M.D.   On: 03/10/2022 16:27   CT ABDOMEN PELVIS W CONTRAST  Result Date: 03/10/2022 CLINICAL DATA:  Chest pain and right lower quadrant abdominal pain. EXAM: CT ANGIOGRAPHY CHEST CT ABDOMEN AND PELVIS WITH CONTRAST TECHNIQUE: Multidetector CT imaging of the chest was performed using the standard protocol during bolus administration of intravenous contrast. Multiplanar CT image reconstructions and MIPs were obtained to evaluate the vascular anatomy. Multidetector CT imaging of the abdomen and pelvis was performed using the standard protocol during bolus administration of intravenous contrast. RADIATION DOSE REDUCTION: This exam was performed according to the departmental dose-optimization program which includes automated exposure control, adjustment of the mA and/or kV according to patient size and/or use of iterative reconstruction technique. CONTRAST:  34m OMNIPAQUE IOHEXOL 350 MG/ML SOLN  COMPARISON:  CT abdomen/pelvis 01/09/2022 and prior chest CT 06/07/2020 FINDINGS: CTA CHEST FINDINGS Cardiovascular: The heart is enlarged with four-chamber enlargement. No pericardial effusion. There is moderate tortuosity and calcification of the thoracic aorta but no aneurysm or dissection. The pulmonary arterial tree is fairly well opacified. The main pulmonary arteries are enlarged which may suggest pulmonary hypertension. No filling defects to suggest pulmonary embolism. Mediastinum/Nodes: Borderline enlarged mediastinal and hilar lymph nodes likely due to the underlying lung findings. The esophagus is grossly normal. Lungs/Pleura: Large bilateral pleural effusions. With mild overlying right lower lobe atelectasis and moderate left lower lobe atelectasis. Hazy interstitial changes and ground-glass opacity likely reflecting asymmetric pulmonary edema. No pulmonary lesions. Musculoskeletal: No significant bony findings. Review of the MIP images confirms the above findings. CT ABDOMEN and PELVIS FINDINGS Hepatobiliary: No hepatic lesions or intrahepatic biliary dilatation. The gallbladder is surgically absent. No common bile duct dilatation. Pancreas: No mass, inflammation ductal dilatation. Moderate age related pancreatic atrophy. Spleen: Normal size.  No focal lesions. Adrenals/Urinary Tract: The adrenal glands are normal. Mild renal cortical thinning but no renal lesions or hydronephrosis. No collecting system abnormalities on the delayed images. The bladder is. Stomach/Bowel: The stomach, duodenum, small and colon are unremarkable. No acute inflammatory process, mass lesions obstructive findings. The terminal ileum and appendix are normal. Scattered colonic diverticulosis but no findings for acute diverticulitis. Vascular/Lymphatic: Advanced atherosclerotic calcification involving the aorta and branch vessels but no aneurysm dissection. The major venous structures are patent. No mesenteric or retroperitoneal  mass or adenopathy. There is a stable rim calcified density in the left upper pelvis adjacent to the descending colon which could be an area of old fat necrosis or calcified lymph node. Reproductive: The uterus and ovaries are unremarkable. Other: Small amount of free pelvic fluid is noted. There is also mild diffuse body wall edema. Musculoskeletal: No significant bony findings. Review of the MIP images confirms the above findings. IMPRESSION: 1. No CT findings for pulmonary embolism. 2. Cardiac enlargement. 3. Large bilateral pleural effusions with overlying  right lower lobe atelectasis and moderate left lower lobe atelectasis. 4. Hazy interstitial changes and ground-glass opacity likely reflecting asymmetric pulmonary edema. 5. No acute abdominal/pelvic findings, mass lesions or adenopathy. 6. Advanced atherosclerotic calcification involving the thoracic and abdominal aorta and branch vessels. 7. Aortic atherosclerosis. Aortic Atherosclerosis (ICD10-I70.0). Electronically Signed   By: Marijo Sanes M.D.   On: 03/10/2022 16:27   DG Chest Portable 1 View  Result Date: 03/10/2022 CLINICAL DATA:  Shortness of breath, decreased O2 sats. EXAM: PORTABLE CHEST 1 VIEW COMPARISON:  02/01/2022 and CT chest 04/07/2020. FINDINGS: Trachea is midline. Heart is enlarged, stable. Thoracic aorta is calcified. Mid and lower lung zone airspace opacification with large left and small right pleural effusions. There may be consolidation in the left lower lobe. IMPRESSION: 1. Congestive heart failure. 2. Possible left lower lobe consolidation. Difficult to exclude pneumonia. Electronically Signed   By: Lorin Picket M.D.   On: 03/10/2022 11:41    Pending Labs Unresulted Labs (From admission, onward)     Start     Ordered   03/17/22 0500  Creatinine, serum  (enoxaparin (LOVENOX)    CrCl < 30 ml/min)  Once,   R       Comments: while on enoxaparin therapy.    03/10/22 1808   03/11/22 0500  CBC  Tomorrow morning,   R         03/10/22 1808   03/11/22 0500  Comprehensive metabolic panel  Tomorrow morning,   R        03/10/22 1808   03/10/22 1807  Magnesium  Once,   R        03/10/22 1808            Vitals/Pain Today's Vitals   03/10/22 2030 03/10/22 2100 03/10/22 2117 03/10/22 2130  BP: 117/78 114/65  115/69  Pulse: 93 77  78  Resp: 20 20  17  $ Temp:      TempSrc:      SpO2: 96% 95%  97%  PainSc:   0-No pain     Isolation Precautions No active isolations  Medications Medications  aspirin chewable tablet 81 mg (81 mg Oral Given 03/10/22 1929)  atorvastatin (LIPITOR) tablet 10 mg (has no administration in time range)  levothyroxine (SYNTHROID) tablet 62.5 mcg (has no administration in time range)  pantoprazole (PROTONIX) EC tablet 40 mg (has no administration in time range)  arformoterol (BROVANA) nebulizer solution 15 mcg (15 mcg Nebulization Given 03/10/22 1930)    And  umeclidinium bromide (INCRUSE ELLIPTA) 62.5 MCG/ACT 1 puff (1 puff Inhalation Given 03/10/22 1955)  albuterol (PROVENTIL) (2.5 MG/3ML) 0.083% nebulizer solution 2.5 mg (has no administration in time range)  budesonide (PULMICORT) nebulizer solution 0.25 mg (0.25 mg Nebulization Given 03/10/22 1930)  enoxaparin (LOVENOX) injection 30 mg (30 mg Subcutaneous Given 03/10/22 1930)  furosemide (LASIX) injection 60 mg (has no administration in time range)  sodium chloride flush (NS) 0.9 % injection 3 mL (has no administration in time range)  acetaminophen (TYLENOL) tablet 650 mg (has no administration in time range)    Or  acetaminophen (TYLENOL) suppository 650 mg (has no administration in time range)  polyethylene glycol (MIRALAX / GLYCOLAX) packet 17 g (has no administration in time range)  ondansetron (ZOFRAN) tablet 4 mg (4 mg Oral Given 03/10/22 1824)  polyvinyl alcohol (LIQUIFILM TEARS) 1.4 % ophthalmic solution 1 drop (has no administration in time range)  famotidine (PEPCID) tablet 10 mg (has no administration in time range)   simethicone (MYLICON)  chewable tablet 80 mg (has no administration in time range)  FLUoxetine (PROZAC) capsule 10 mg (has no administration in time range)  risperiDONE (RISPERDAL) tablet 1 mg (1 mg Oral Given 03/10/22 2115)  erythromycin ophthalmic ointment 1 Application (1 Application Left Eye Given 03/10/22 2115)  furosemide (LASIX) injection 40 mg (40 mg Intravenous Given 03/10/22 1320)  sodium chloride tablet 1 g (1 g Oral Given 03/10/22 1326)  potassium chloride SA (KLOR-CON M) CR tablet 40 mEq (40 mEq Oral Given 03/10/22 1323)  magnesium oxide (MAG-OX) tablet 800 mg (800 mg Oral Given 03/10/22 1323)  albumin human 25 % solution 25 g (0 g Intravenous Stopped 03/10/22 1916)  iohexol (OMNIPAQUE) 350 MG/ML injection 65 mL (65 mLs Intravenous Contrast Given 03/10/22 1610)  furosemide (LASIX) injection 40 mg (40 mg Intravenous Given 03/10/22 1824)    Mobility walks with person assist     Focused Assessments Cardiac Assessment Handoff:  Cardiac Rhythm: Atrial fibrillation No results found for: "CKTOTAL", "CKMB", "CKMBINDEX", "TROPONINI" No results found for: "DDIMER" Does the Patient currently have chest pain? No    R Recommendations: See Admitting Provider Note  Report given to:   Additional Notes:  Patient has to take several breaks while eating a meal to help GI upset, do not use straws to also prevent GI upset, patient does not remember these details herself

## 2022-03-10 NOTE — ED Provider Notes (Signed)
[facility] Provider Note  CSN: [id number] Arrival date & time: 03/10/22 1034  Chief Complaint(s) Shortness of Breath  HPI Donna Arias is a 89 y.o. female with PMH COPD, CAD status post MI, HTN, HLD, previous CVA, CHF who presents emergency department for evaluation of shortness of breath and abdominal pain.  Daughter states that the patient recently has had to double her Lasix due to worsening swelling of the lower extremities and few accumulation in the lower back.  She has been followed by gastroenterology recently for decreased p.o. intake and intermittent abdominal pain.  She states that this morning, patient was significantly short of breath with accessory muscle use and she received a home breathing treatment that seem to have greatly improved her symptoms.  Here in the emergency department, patient endorses some pain to the lateral chest wall bilaterally but otherwise states that she feels quite well.  Of note, patient arrives with slightly rapid A-fib but is currently not on any rate control therapy and daughter states that there was apparently a risk-benefit discussion had with cardiology that decided not to place patient on any rate control medications.   Past Medical History Past Medical History:  Diagnosis Date   Chronic headaches    COPD (chronic obstructive pulmonary disease) (HCC)    Dementia ([place])    Eye infection, left 01/16/2018   GERD (gastroesophageal reflux disease)    Heart attack ([place]) 1998   HLD (hyperlipidemia)    Hypertension    Skin cancer    squamous cell of left face   Squamous cell carcinoma of skin 11/03/2021   L zygoma - poorly differentiated   Stroke [facility])    Thyroid disease    Patient Active Problem List   Diagnosis Date Noted   Nausea 02/10/2022   Constipation 02/10/2022   N&V (nausea and vomiting) 01/11/2022   Persistent atrial fibrillation ([place]) 01/11/2022   Stage 3b chronic kidney disease ([place])  06/02/2021   Allergic rhinitis 04/20/2021   Dysphagia 04/30/2020   Impacted cerumen of right ear 04/18/2020   Exudative age-related macular degeneration of right eye with active choroidal neovascularization ([place]) 12/03/2019   Exudative age-related macular degeneration of left eye with inactive choroidal neovascularization ([place]) 06/04/2019   Posterior vitreous detachment of right eye 06/04/2019   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   Advanced nonexudative age-related macular degeneration of left eye with subfoveal involvement 06/04/2019   Intermediate stage nonexudative age-related macular degeneration of right eye 06/04/2019   Excessive cerumen in ear canal, bilateral 11/06/2018   Ear fullness, left 01/16/2018   Multiple atypical nevi 06/23/2017   Ankle edema, bilateral 05/02/2017   Postnasal drip 12/13/2016   Orthostatic hypotension 11/22/2016   Hyperlipidemia 11/22/2016   Anxiety 11/22/2016   Dysarthria 11/22/2016   Weight gain 11/18/2016   Edema of right foot 11/18/2016   Age-related osteoporosis without current pathological fracture 11/16/2016   Cough 11/16/2016   Healthcare maintenance 10/14/2016   Diaper dermatitis 06/10/2016   Antibiotic-induced yeast infection 06/10/2016   Hearing loss secondary to cerumen impaction, bilateral 06/10/2016   Chronic bilateral low back pain 04/19/2016   Chronic neck pain 04/19/2016   Medication refill 04/14/2016   Joint laxity of right knee 04/14/2016   Hypothyroidism 02/04/2016   Upper back pain 08/01/2009   Transient cerebral ischemia 09/22/2007   Dementia with behavioral disturbance ([place]) 10/07/2006   Essential hypertension 10/07/2006   COPD (chronic obstructive pulmonary disease) ([place]) 10/07/2006   GERD 10/07/2006  Home Medication(s) Prior to Admission medications   Medication Sig Start Date End Date Taking? Authorizing Provider  albuterol (PROVENTIL) (2.5 MG/3ML) 0.083% nebulizer solution Take 3 mLs (2.5 mg total) by  nebulization every 6 (six) hours as needed for wheezing or shortness of breath. 11/12/19   Lorrene Reid, PA-C  amLODipine (NORVASC) 2.5 MG tablet Take 1 tablet (2.5 mg total) by mouth daily. 12/07/21   Lorrene Reid, PA-C  aspirin 81 MG tablet Take 81 mg by mouth every evening.    [provider]  atorvastatin (LIPITOR) 10 MG tablet Take 1 tablet (10 mg total) by mouth daily. 12/07/21   Lorrene Reid, PA-C  benzonatate (TESSALON) 100 MG capsule Take 1 capsule (100 mg total) by mouth 3 (three) times daily as needed for cough. 03/24/21   Abonza, Maritza, PA-C  budesonide (PULMICORT) 0.25 MG/2ML nebulizer solution Take 2 mLs (0.25 mg total) by nebulization in the morning and at bedtime. 04/20/21   Martyn Ehrich, NP  Calcium Carbonate-Vit D-Min (CALCIUM 1200 PO) Take 1 tablet by mouth daily.    [provider]  clotrimazole-betamethasone (LOTRISONE) cream Apply 1 Application topically 2 (two) times daily. 02/22/22   Ronnell Freshwater, NP  Cranberry 500 MG TABS Take 2 tablets by mouth 3 (three) times a week.    [provider]  dextromethorphan-guaiFENesin (MUCINEX DM) 30-600 MG 12hr tablet Take 1 tablet by mouth 2 (two) times daily as needed for cough.    [provider]  erythromycin ophthalmic ointment Place 1 application into the left eye at bedtime. Patient taking differently: Place 1 application  into the left eye at bedtime as needed (dry eyes). 08/05/20   Lorrene Reid, PA-C  fexofenadine (ALLEGRA) 180 MG tablet Take 180 mg by mouth daily as needed for allergies.    [provider]  FLUoxetine (PROZAC) 10 MG capsule TAKE 1 CAPSULE BY MOUTH DAILY. 03/08/22   Ronnell Freshwater, NP  fluticasone (FLONASE) 50 MCG/ACT nasal spray Place 1 spray into both nostrils 2 (two) times daily. Patient taking differently: Place 1 spray into both nostrils 2 (two) times daily as needed for allergies. 10/12/18   Danford, Valetta Fuller D, NP  furosemide (LASIX) 20 MG tablet  Take 1 tablet po QD. May take 2nd tablet PO prn edema 03/08/22   Ronnell Freshwater, NP  ipratropium-albuterol (DUONEB) 0.5-2.5 (3) MG/3ML SOLN Take 3 mLs by nebulization every 4 (four) hours as needed. Patient taking differently: Take 3 mLs by nebulization every 6 (six) hours as needed (sob/wheezing). 12/25/21   Lynden Oxford Scales, PA-C  ketoconazole (NIZORAL) 2 % shampoo APPLY TOPICALLY 2 TIMES A WEEK. Patient taking differently: Apply 1 Application topically once a week. 12/07/21   Lorrene Reid, PA-C  levothyroxine (SYNTHROID) 25 MCG tablet TAKE 1/2 TABLET BY MOUTH DAILY WITH 50 MCG TO EQUAL 62.5 MCG TOTAL DAILY 12/07/21   Lorrene Reid, PA-C  levothyroxine (SYNTHROID) 50 MCG tablet TAKE 1 TABLET BY MOUTH EVERY DAY 10/21/21   Abonza, Maritza, PA-C  loratadine (CLARITIN) 10 MG tablet Take 10 mg by mouth daily.    [provider]  mupirocin ointment (BACTROBAN) 2 % APPLY TO AFFECTED AREA ON THE SKIN 2 TIMES A DAY Patient taking differently: Apply 1 Application topically 2 (two) times daily as needed (skin irritation). 08/20/21   Lorrene Reid, PA-C  mupirocin ointment (BACTROBAN) 2 % Apply 1 Application topically daily. With dressing changes Patient taking differently: Apply 1 Application topically daily as needed (rash). With dressing changes 11/03/21   Nehemiah Massed,  Monia Sabal, MD  nystatin (MYCOSTATIN/NYSTOP) powder Apply 1 Application topically 2 (two) times daily. 12/07/21   Abonza, Maritza, PA-C  ondansetron (ZOFRAN) 4 MG tablet Take 1 tablet (4 mg total) by mouth 2 (two) times daily. 02/22/22   Zehr, Laban Emperor, PA-C  pantoprazole (PROTONIX) 40 MG tablet Take 1 tablet (40 mg total) by mouth daily. 01/11/22 01/11/23  Parrett, Fonnie Mu, NP  Polyvinyl Alcohol-Povidone PF (REFRESH) 1.4-0.6 % SOLN Apply 1 drop to eye 2 (two) times daily as needed (dry eyes).    [provider]  potassium chloride SA (KLOR-CON M) 20 MEQ tablet Take 1 tablet (20 mEq total) by mouth daily. 12/07/21    Abonza, Herb Grays, PA-C  risperiDONE (RISPERDAL) 1 MG tablet TAKE 1 TABLET BY MOUTH 2 TIMES DAILY. 11/11/21   Lorrene Reid, PA-C  Spacer/Aero-Holding Chambers (AEROCHAMBER PLUS WITH MASK) inhaler Use with Stiolto inhalations. 12/25/21   Lynden Oxford Scales, PA-C  STIOLTO RESPIMAT 2.5-2.5 MCG/ACT AERS INHALE 2 PUFFS INTO THE LUNGS DAILY. 03/01/22   Ronnell Freshwater, NP  triamcinolone cream (KENALOG) 0.1 % Apply 1 application topically 2 (two) times daily. Request jar 10/12/18   Esaw Grandchild, NP                                                                                                                                    Past Surgical History Past Surgical History:  Procedure Laterality Date   BREAST LUMPECTOMY Left 1995   DILATION AND CURETTAGE OF [place]   Family History Family History  Problem Relation Age of Onset   Hypertension Mother    Stroke Mother    Stroke Sister    Alzheimer's disease Sister    Stroke Sister    Hypertension Sister    Stroke Sister    Breast cancer Sister     Social History Social History   Tobacco Use   Smoking status: Former    Types: Cigarettes    Quit date: 1995    Years since quitting: 29.1    Passive exposure: Never   Smokeless tobacco: Never  Vaping Use   Vaping Use: Never used  Substance Use Topics   Alcohol use: No   Drug use: No   Allergies Picato [ingenol mebutate] and Codeine sulfate  Review of Systems Review of Systems  Respiratory:  Positive for shortness of breath.   Gastrointestinal:  Positive for abdominal pain.    Physical Exam Vital Signs  I have reviewed the triage vital signs BP (!) 149/74 (BP Location: Right Arm)   Pulse (!) 107   Temp (!) 97.5 F (36.4 C) (Oral)   Resp 18   SpO2 97%   Physical Exam Vitals and nursing note reviewed.  Constitutional:      General: She is not in acute distress.    Appearance: She is well-developed.  HENT:     Head: Normocephalic  and  atraumatic.  Eyes:     Conjunctiva/sclera: Conjunctivae normal.  Cardiovascular:     Rate and Rhythm: Normal rate and regular rhythm.     Heart sounds: No murmur heard. Pulmonary:     Effort: Pulmonary effort is normal. No respiratory distress.     Breath sounds: Rales present.  Chest:     Chest wall: Tenderness present.  Abdominal:     Palpations: Abdomen is soft.     Tenderness: There is no abdominal tenderness.  Musculoskeletal:        General: No swelling.     Cervical back: Neck supple.     Right lower leg: Edema present.     Left lower leg: Edema present.  Skin:    General: Skin is warm and dry.     Capillary Refill: Capillary refill takes less than 2 seconds.  Neurological:     Mental Status: She is alert.  Psychiatric:        Mood and Affect: Mood normal.     ED Results and Treatments Labs (all labs ordered are listed, but only abnormal results are displayed) Labs Reviewed  COMPREHENSIVE METABOLIC PANEL  CBC WITH DIFFERENTIAL/PLATELET  BRAIN NATRIURETIC PEPTIDE  LIPASE, BLOOD  URINALYSIS, ROUTINE W REFLEX MICROSCOPIC  TROPONIN I (HIGH SENSITIVITY)                                                                                                                          Radiology DG Chest Portable 1 View  Result Date: 03/10/2022 CLINICAL DATA:  Shortness of breath, decreased O2 sats. EXAM: PORTABLE CHEST 1 VIEW COMPARISON:  02/01/2022 and CT chest 04/07/2020. FINDINGS: Trachea is midline. Heart is enlarged, stable. Thoracic aorta is calcified. Mid and lower lung zone airspace opacification with large left and small right pleural effusions. There may be consolidation in the left lower lobe. IMPRESSION: 1. Congestive heart failure. 2. Possible left lower lobe consolidation. Difficult to exclude pneumonia. Electronically Signed   By: Lorin Picket M.D.   On: 03/10/2022 11:41    Pertinent labs & imaging results that were available during my care of the patient were  reviewed by me and considered in my medical decision making (see MDM for details).  Medications Ordered in ED Medications - No data to display  Procedures Procedures  (including critical care time)  Medical Decision Making / ED Course   This patient presents to the ED for concern of shortness of breath, this involves an extensive number of treatment options, and is a complaint that carries with it a high risk of complications and morbidity.  The differential diagnosis includes Pe, PTX, Pulmonary Edema, ARDS, COPD/Asthma, ACS, CHF exacerbation, Arrhythmia, Pericardial Effusion/Tamponade, Anemia, Sepsis, Acidosis/Hypercapnia, Anxiety, Viral URI  MDM: Patient seen emerged part for evaluation of shortness of breath.  Physical exam with lower extremity pitting edema and rales at the bases but is otherwise unremarkable.  Laboratory evaluation with a hemoglobin of 11.6, new hyponatremia to 129, creatinine 1.24, hypoalbuminemia to 2.9, BNP elevated to 997.4, high-sensitivity troponin elevated to 22 but delta troponin is 19.  Initial chest x-ray showing congestive heart failure with a left lower lobe consolidation.  Started augmented diuresis on this patient with albumin and Lasix but on my reevaluation she had not had any urinary output.  Bedside ultrasound showing at least 500 cc of retained urine and thus an In-N-Out catheter was performed.  At time of signout, patient pending CT imaging.  Please see provider signout for continuation of workup.  Of note, I did speak with the [facility] team at request of the patient's daughter and placed orders for home health and a hospital bed.   Additional history obtained: -Additional history obtained from daughter -External records from outside source obtained and reviewed including: Chart review including previous notes, labs, imaging,  consultation notes   Lab Tests: -I ordered, reviewed, and interpreted labs.   The pertinent results include:   Labs Reviewed  COMPREHENSIVE METABOLIC PANEL  CBC WITH DIFFERENTIAL/PLATELET  BRAIN NATRIURETIC PEPTIDE  LIPASE, BLOOD  URINALYSIS, ROUTINE W REFLEX MICROSCOPIC  TROPONIN I (HIGH SENSITIVITY)      EKG   EKG Interpretation  Date/Time:  Wednesday March 10 2022 10:44:14 EST Ventricular Rate:  110 PR Interval:    QRS Duration: 134 QT Interval:  344 QTC Calculation: 466 R Axis:   137 Text Interpretation: Atrial fibrillation Left bundle branch block Confirmed by Keyarra Rendall ([id number]) on 03/10/2022 1:03:54 PM         Imaging Studies ordered: I ordered imaging studies including chest x-ray I independently visualized and interpreted imaging. I agree with the radiologist interpretation  CT PE and CT abdomen pelvis pending   Medicines ordered and prescription drug management: No orders of the defined types were placed in this encounter.   -I have reviewed the patients home medicines and have made adjustments as needed  Critical interventions none   Cardiac Monitoring: The patient was maintained on a cardiac monitor.  I personally viewed and interpreted the cardiac monitored which showed an underlying rhythm of: A-fib, left bundle branch block  Social Determinants of Health:  Factors impacting patients care include: Currently being cared for at home by daughter   Reevaluation: After the interventions noted above, I reevaluated the patient and found that they have :improved  Co morbidities that complicate the patient evaluation  Past Medical History:  Diagnosis Date   Chronic headaches    COPD (chronic obstructive pulmonary disease) ([place])    Dementia ([place])    Eye infection, left 01/16/2018   GERD (gastroesophageal reflux disease)    Heart attack ([place]) 1998   HLD (hyperlipidemia)    Hypertension    Skin cancer    squamous cell of left face    Squamous cell carcinoma of skin 11/03/2021   L zygoma -  poorly differentiated   Stroke [facility])    Thyroid disease       Dispostion: I considered admission for this patient, and disposition pending CT imaging.  Please see provider signout for continuation of workup     Final Clinical Impression(s) / ED Diagnoses Final diagnoses:  None     @PCDICTATION$ @    Teressa Lower, MD 03/10/22 [phone]

## 2022-03-10 NOTE — ED Provider Notes (Signed)
Received patient in turnover from Dr. Matilde Sprang.  Please see their note for further details of Hx, PE.  Briefly patient is a 89 y.o. female with a Shortness of Breath .  Patient found to likely have heart failure.  No obvious hx of same on my record review.  CT without PE or pna, concern for fluid overload.  Discussed with family, very sob this morning with hypoxia at home.  Will discuss with medicine for admission.     Deno Etienne, DO 03/10/22 1710

## 2022-03-10 NOTE — H&P (Signed)
History and Physical   Donna Arias [id number] DOB: [birth year] DOA: 03/10/2022  PCP: Velva Harman, PA   Patient coming from: Home  Chief Complaint: Shortness of breath  HPI: Donna Arias is a 89 y.o. female with medical history significant of CKD 3B, atrial fibrillation, hypothyroidism, hyperlipidemia, GERD, hypertension, CAD status post MI in 1998, COPD, anxiety, chronic pain, stroke, macular degeneration, dementia presenting with shortness of breath.  History obtained with assistance of chart review and family.  Patient has had recently increasing edema in her lower extremities and back she has had to double her Lasix dose from 20 mg to 40 mg daily.  She also has had ongoing abdominal pain that she has been following with GI due to this intermittent abdominal pain and decreased p.o. intake.  Today she had significantly increased shortness of breath.  She did have some improvement with home nebulizer treatments but presented to the ED due to worsening symptoms and failure of diuretic therapy.  Reports shortness of breath.  Denies chest pain, chills, fever, constipation, diarrhea, nausea, vomiting.  ED Course: Vital signs in ED significant for blood pressure in the [id number] systolic and respiratory rate in the teens to 20s.  Lab workup included CMP with sodium 129, chloride 94, creatinine 1.24 which is near baseline, glucose 102, calcium 8.8, protein 5.7, albumin 2.9.  CBC with hemoglobin 11.6 near baseline of 12-13.  Troponin flat at 22 and then 19 and repeat.  Lipase normal.  BNP elevated to 997.  Chest x-ray showing evidence of CHF with CTA PE study negative for PE but did show cardiomegaly with large bilateral effusions as well as bilateral atelectasis and opacities and interstitial changes likely representing pulmonary edema.  CT of the abdomen pelvis showed no acute normality.  Patient received Lasix, albumin 40 mill equivalents p.o. potassium, p.o. magnesium, sodium chloride  tab.  Review of Systems: As per HPI otherwise all other systems reviewed and are negative.  Past Medical History:  Diagnosis Date   Chronic headaches    COPD (chronic obstructive pulmonary disease) ([place])    Dementia ([place])    Eye infection, left 01/16/2018   GERD (gastroesophageal reflux disease)    Heart attack ([place]) 1998   HLD (hyperlipidemia)    Hypertension    Skin cancer    squamous cell of left face   Squamous cell carcinoma of skin 11/03/2021   L zygoma - poorly differentiated   Stroke ([place])    Thyroid disease     Past Surgical History:  Procedure Laterality Date   BREAST LUMPECTOMY Left [place]    Social History  reports that she quit smoking about 29 years ago. Her smoking use included cigarettes. She has never been exposed to tobacco smoke. She has never used smokeless tobacco. She reports that she does not drink alcohol and does not use drugs.  Allergies  Allergen Reactions   Picato [Ingenol Mebutate] Hives   Codeine Sulfate Other (See Comments)    REACTION: unspecified    Family History  Problem Relation Age of Onset   Hypertension Mother    Stroke Mother    Stroke Sister    Alzheimer's disease Sister    Stroke Sister    Hypertension Sister    Stroke Sister    Breast cancer Sister   Reviewed on admission  Prior to Admission medications   Medication Sig Start Date End Date Taking?  Authorizing Provider  albuterol (PROVENTIL) (2.5 MG/3ML) 0.083% nebulizer solution Take 3 mLs (2.5 mg total) by nebulization every 6 (six) hours as needed for wheezing or shortness of breath. 11/12/19   Lorrene Reid, PA-C  amLODipine (NORVASC) 2.5 MG tablet Take 1 tablet (2.5 mg total) by mouth daily. 12/07/21   Lorrene Reid, PA-C  aspirin 81 MG tablet Take 81 mg by mouth every evening.    [provider]  atorvastatin (LIPITOR) 10 MG tablet Take 1 tablet (10 mg total) by mouth daily. 12/07/21    Lorrene Reid, PA-C  benzonatate (TESSALON) 100 MG capsule Take 1 capsule (100 mg total) by mouth 3 (three) times daily as needed for cough. 03/24/21   Abonza, Maritza, PA-C  budesonide (PULMICORT) 0.25 MG/2ML nebulizer solution Take 2 mLs (0.25 mg total) by nebulization in the morning and at bedtime. 04/20/21   Martyn Ehrich, NP  Calcium Carbonate-Vit D-Min (CALCIUM 1200 PO) Take 1 tablet by mouth daily.    [provider]  clotrimazole-betamethasone (LOTRISONE) cream Apply 1 Application topically 2 (two) times daily. 02/22/22   Ronnell Freshwater, NP  Cranberry 500 MG TABS Take 2 tablets by mouth 3 (three) times a week.    [provider]  dextromethorphan-guaiFENesin (MUCINEX DM) 30-600 MG 12hr tablet Take 1 tablet by mouth 2 (two) times daily as needed for cough.    [provider]  erythromycin ophthalmic ointment Place 1 application into the left eye at bedtime. Patient taking differently: Place 1 application  into the left eye at bedtime as needed (dry eyes). 08/05/20   Lorrene Reid, PA-C  fexofenadine (ALLEGRA) 180 MG tablet Take 180 mg by mouth daily as needed for allergies.    [provider]  FLUoxetine (PROZAC) 10 MG capsule TAKE 1 CAPSULE BY MOUTH DAILY. 03/08/22   Ronnell Freshwater, NP  fluticasone (FLONASE) 50 MCG/ACT nasal spray Place 1 spray into both nostrils 2 (two) times daily. Patient taking differently: Place 1 spray into both nostrils 2 (two) times daily as needed for allergies. 10/12/18   Danford, Valetta Fuller D, NP  furosemide (LASIX) 20 MG tablet Take 1 tablet po QD. May take 2nd tablet PO prn edema 03/08/22   Ronnell Freshwater, NP  ipratropium-albuterol (DUONEB) 0.5-2.5 (3) MG/3ML SOLN Take 3 mLs by nebulization every 4 (four) hours as needed. Patient taking differently: Take 3 mLs by nebulization every 6 (six) hours as needed (sob/wheezing). 12/25/21   Lynden Oxford Scales, PA-C  ketoconazole (NIZORAL) 2 % shampoo APPLY TOPICALLY 2 TIMES A  WEEK. Patient taking differently: Apply 1 Application topically once a week. 12/07/21   Lorrene Reid, PA-C  levothyroxine (SYNTHROID) 25 MCG tablet TAKE 1/2 TABLET BY MOUTH DAILY WITH 50 MCG TO EQUAL 62.5 MCG TOTAL DAILY 12/07/21   Lorrene Reid, PA-C  levothyroxine (SYNTHROID) 50 MCG tablet TAKE 1 TABLET BY MOUTH EVERY DAY 10/21/21   Abonza, Maritza, PA-C  loratadine (CLARITIN) 10 MG tablet Take 10 mg by mouth daily.    [provider]  mupirocin ointment (BACTROBAN) 2 % APPLY TO AFFECTED AREA ON THE SKIN 2 TIMES A DAY Patient taking differently: Apply 1 Application topically 2 (two) times daily as needed (skin irritation). 08/20/21   Lorrene Reid, PA-C  mupirocin ointment (BACTROBAN) 2 % Apply 1 Application topically daily. With dressing changes Patient taking differently: Apply 1 Application topically daily as needed (rash). With dressing changes 11/03/21   Ralene Bathe, MD  nystatin (MYCOSTATIN/NYSTOP) powder Apply 1 Application topically 2 (two) times daily.  12/07/21   Abonza, Maritza, PA-C  ondansetron (ZOFRAN) 4 MG tablet Take 1 tablet (4 mg total) by mouth 2 (two) times daily. 02/22/22   Zehr, Laban Emperor, PA-C  pantoprazole (PROTONIX) 40 MG tablet Take 1 tablet (40 mg total) by mouth daily. 03/10/22 03/10/23  Parrett, Fonnie Mu, NP  Polyvinyl Alcohol-Povidone PF (REFRESH) 1.4-0.6 % SOLN Apply 1 drop to eye 2 (two) times daily as needed (dry eyes).    [provider]  potassium chloride SA (KLOR-CON M) 20 MEQ tablet Take 1 tablet (20 mEq total) by mouth daily. 12/07/21   Abonza, Herb Grays, PA-C  risperiDONE (RISPERDAL) 1 MG tablet TAKE 1 TABLET BY MOUTH 2 TIMES DAILY. 11/11/21   Lorrene Reid, PA-C  Spacer/Aero-Holding Chambers (AEROCHAMBER PLUS WITH MASK) inhaler Use with Stiolto inhalations. 12/25/21   Lynden Oxford Scales, PA-C  STIOLTO RESPIMAT 2.5-2.5 MCG/ACT AERS INHALE 2 PUFFS INTO THE LUNGS DAILY. 03/01/22   Ronnell Freshwater, NP  triamcinolone cream (KENALOG)  0.1 % Apply 1 application topically 2 (two) times daily. Request jar 10/12/18   Mina Marble D, NP    Physical Exam: Vitals:   03/10/22 1515 03/10/22 1530 03/10/22 1545 03/10/22 1700  BP:  112/72  (!) 129/95  Pulse: 77 87 72 77  Resp: 20 20 (!) 21 19  Temp:      TempSrc:      SpO2: 94% 95% 95% 91%    Physical Exam Constitutional:      General: She is not in acute distress.    Appearance: Normal appearance.  HENT:     Head: Normocephalic and atraumatic.     Mouth/Throat:     Mouth: Mucous membranes are moist.     Pharynx: Oropharynx is clear.  Eyes:     Extraocular Movements: Extraocular movements intact.     Pupils: Pupils are equal, round, and reactive to light.  Cardiovascular:     Rate and Rhythm: Normal rate and regular rhythm.     Pulses: Normal pulses.     Heart sounds: Normal heart sounds.  Pulmonary:     Effort: Pulmonary effort is normal. No respiratory distress.     Breath sounds: Rales present.  Abdominal:     General: Bowel sounds are normal. There is no distension.     Palpations: Abdomen is soft.     Tenderness: There is no abdominal tenderness.  Musculoskeletal:        General: No swelling or deformity.     Right lower leg: Edema present.     Left lower leg: Edema present.  Skin:    General: Skin is warm and dry.  Neurological:     General: No focal deficit present.     Mental Status: Mental status is at baseline.    Labs on Admission: I have personally reviewed following labs and imaging studies  CBC: Recent Labs  Lab 03/10/22 1140  WBC 7.5  NEUTROABS 6.2  HGB 11.6*  HCT 35.8*  MCV 91.8  PLT [id number]    Basic Metabolic Panel: Recent Labs  Lab 03/08/22 1057 03/10/22 1140  NA 135 129*  K 4.5 3.8  CL 97 94*  CO2 21 23  GLUCOSE 118* 102*  BUN 10 9  CREATININE 1.09* 1.24*  CALCIUM 9.7 8.8*    GFR: Estimated Creatinine Clearance: 23.4 mL/min (A) (by C-G formula based on SCr of 1.24 mg/dL (H)).  Liver Function Tests: Recent Labs   Lab 03/10/22 1140  AST 25  ALT 14  ALKPHOS 72  BILITOT  1.2  PROT 5.7*  ALBUMIN 2.9*    Urine analysis:    Component Value Date/Time   COLORURINE YELLOW 01/08/2022 2253   APPEARANCEUR CLEAR 01/08/2022 2253   LABSPEC 1.018 01/08/2022 2253   PHURINE 5.0 01/08/2022 2253   GLUCOSEU NEGATIVE 01/08/2022 2253   HGBUR NEGATIVE 01/08/2022 2253   HGBUR negative 02/26/2008 0950   BILIRUBINUR NEGATIVE 01/08/2022 2253   BILIRUBINUR negative 12/25/2021 1104   BILIRUBINUR Negative 12/01/2020 1403   KETONESUR 5 (A) 01/08/2022 2253   PROTEINUR 30 (A) 01/08/2022 2253   UROBILINOGEN 0.2 12/25/2021 1104   UROBILINOGEN 0.2 02/26/2008 0950   NITRITE NEGATIVE 01/08/2022 2253   LEUKOCYTESUR SMALL (A) 01/08/2022 2253    Radiological Exams on Admission: CT Angio Chest PE W and/or Wo Contrast  Result Date: 03/10/2022 CLINICAL DATA:  Chest pain and right lower quadrant abdominal pain. EXAM: CT ANGIOGRAPHY CHEST CT ABDOMEN AND PELVIS WITH CONTRAST TECHNIQUE: Multidetector CT imaging of the chest was performed using the standard protocol during bolus administration of intravenous contrast. Multiplanar CT image reconstructions and MIPs were obtained to evaluate the vascular anatomy. Multidetector CT imaging of the abdomen and pelvis was performed using the standard protocol during bolus administration of intravenous contrast. RADIATION DOSE REDUCTION: This exam was performed according to the departmental dose-optimization program which includes automated exposure control, adjustment of the mA and/or kV according to patient size and/or use of iterative reconstruction technique. CONTRAST:  64m OMNIPAQUE IOHEXOL 350 MG/ML SOLN COMPARISON:  CT abdomen/pelvis 01/09/2022 and prior chest CT 06/07/2020 FINDINGS: CTA CHEST FINDINGS Cardiovascular: The heart is enlarged with four-chamber enlargement. No pericardial effusion. There is moderate tortuosity and calcification of the thoracic aorta but no aneurysm or  dissection. The pulmonary arterial tree is fairly well opacified. The main pulmonary arteries are enlarged which may suggest pulmonary hypertension. No filling defects to suggest pulmonary embolism. Mediastinum/Nodes: Borderline enlarged mediastinal and hilar lymph nodes likely due to the underlying lung findings. The esophagus is grossly normal. Lungs/Pleura: Large bilateral pleural effusions. With mild overlying right lower lobe atelectasis and moderate left lower lobe atelectasis. Hazy interstitial changes and ground-glass opacity likely reflecting asymmetric pulmonary edema. No pulmonary lesions. Musculoskeletal: No significant bony findings. Review of the MIP images confirms the above findings. CT ABDOMEN and PELVIS FINDINGS Hepatobiliary: No hepatic lesions or intrahepatic biliary dilatation. The gallbladder is surgically absent. No common bile duct dilatation. Pancreas: No mass, inflammation ductal dilatation. Moderate age related pancreatic atrophy. Spleen: Normal size.  No focal lesions. Adrenals/Urinary Tract: The adrenal glands are normal. Mild renal cortical thinning but no renal lesions or hydronephrosis. No collecting system abnormalities on the delayed images. The bladder is. Stomach/Bowel: The stomach, duodenum, small and colon are unremarkable. No acute inflammatory process, mass lesions obstructive findings. The terminal ileum and appendix are normal. Scattered colonic diverticulosis but no findings for acute diverticulitis. Vascular/Lymphatic: Advanced atherosclerotic calcification involving the aorta and branch vessels but no aneurysm dissection. The major venous structures are patent. No mesenteric or retroperitoneal mass or adenopathy. There is a stable rim calcified density in the left upper pelvis adjacent to the descending colon which could be an area of old fat necrosis or calcified lymph node. Reproductive: The uterus and ovaries are unremarkable. Other: Small amount of free pelvic fluid  is noted. There is also mild diffuse body wall edema. Musculoskeletal: No significant bony findings. Review of the MIP images confirms the above findings. IMPRESSION: 1. No CT findings for pulmonary embolism. 2. Cardiac enlargement. 3. Large bilateral pleural effusions with overlying  right lower lobe atelectasis and moderate left lower lobe atelectasis. 4. Hazy interstitial changes and ground-glass opacity likely reflecting asymmetric pulmonary edema. 5. No acute abdominal/pelvic findings, mass lesions or adenopathy. 6. Advanced atherosclerotic calcification involving the thoracic and abdominal aorta and branch vessels. 7. Aortic atherosclerosis. Aortic Atherosclerosis (ICD10-I70.0). Electronically Signed   By: Marijo Sanes M.D.   On: 03/10/2022 16:27   CT ABDOMEN PELVIS W CONTRAST  Result Date: 03/10/2022 CLINICAL DATA:  Chest pain and right lower quadrant abdominal pain. EXAM: CT ANGIOGRAPHY CHEST CT ABDOMEN AND PELVIS WITH CONTRAST TECHNIQUE: Multidetector CT imaging of the chest was performed using the standard protocol during bolus administration of intravenous contrast. Multiplanar CT image reconstructions and MIPs were obtained to evaluate the vascular anatomy. Multidetector CT imaging of the abdomen and pelvis was performed using the standard protocol during bolus administration of intravenous contrast. RADIATION DOSE REDUCTION: This exam was performed according to the departmental dose-optimization program which includes automated exposure control, adjustment of the mA and/or kV according to patient size and/or use of iterative reconstruction technique. CONTRAST:  64m OMNIPAQUE IOHEXOL 350 MG/ML SOLN COMPARISON:  CT abdomen/pelvis 01/09/2022 and prior chest CT 06/07/2020 FINDINGS: CTA CHEST FINDINGS Cardiovascular: The heart is enlarged with four-chamber enlargement. No pericardial effusion. There is moderate tortuosity and calcification of the thoracic aorta but no aneurysm or dissection. The  pulmonary arterial tree is fairly well opacified. The main pulmonary arteries are enlarged which may suggest pulmonary hypertension. No filling defects to suggest pulmonary embolism. Mediastinum/Nodes: Borderline enlarged mediastinal and hilar lymph nodes likely due to the underlying lung findings. The esophagus is grossly normal. Lungs/Pleura: Large bilateral pleural effusions. With mild overlying right lower lobe atelectasis and moderate left lower lobe atelectasis. Hazy interstitial changes and ground-glass opacity likely reflecting asymmetric pulmonary edema. No pulmonary lesions. Musculoskeletal: No significant bony findings. Review of the MIP images confirms the above findings. CT ABDOMEN and PELVIS FINDINGS Hepatobiliary: No hepatic lesions or intrahepatic biliary dilatation. The gallbladder is surgically absent. No common bile duct dilatation. Pancreas: No mass, inflammation ductal dilatation. Moderate age related pancreatic atrophy. Spleen: Normal size.  No focal lesions. Adrenals/Urinary Tract: The adrenal glands are normal. Mild renal cortical thinning but no renal lesions or hydronephrosis. No collecting system abnormalities on the delayed images. The bladder is. Stomach/Bowel: The stomach, duodenum, small and colon are unremarkable. No acute inflammatory process, mass lesions obstructive findings. The terminal ileum and appendix are normal. Scattered colonic diverticulosis but no findings for acute diverticulitis. Vascular/Lymphatic: Advanced atherosclerotic calcification involving the aorta and branch vessels but no aneurysm dissection. The major venous structures are patent. No mesenteric or retroperitoneal mass or adenopathy. There is a stable rim calcified density in the left upper pelvis adjacent to the descending colon which could be an area of old fat necrosis or calcified lymph node. Reproductive: The uterus and ovaries are unremarkable. Other: Small amount of free pelvic fluid is noted. There  is also mild diffuse body wall edema. Musculoskeletal: No significant bony findings. Review of the MIP images confirms the above findings. IMPRESSION: 1. No CT findings for pulmonary embolism. 2. Cardiac enlargement. 3. Large bilateral pleural effusions with overlying right lower lobe atelectasis and moderate left lower lobe atelectasis. 4. Hazy interstitial changes and ground-glass opacity likely reflecting asymmetric pulmonary edema. 5. No acute abdominal/pelvic findings, mass lesions or adenopathy. 6. Advanced atherosclerotic calcification involving the thoracic and abdominal aorta and branch vessels. 7. Aortic atherosclerosis. Aortic Atherosclerosis (ICD10-I70.0). Electronically Signed   By: PRicky StabsD.  On: 03/10/2022 16:27   DG Chest Portable 1 View  Result Date: 03/10/2022 CLINICAL DATA:  Shortness of breath, decreased O2 sats. EXAM: PORTABLE CHEST 1 VIEW COMPARISON:  02/01/2022 and CT chest 04/07/2020. FINDINGS: Trachea is midline. Heart is enlarged, stable. Thoracic aorta is calcified. Mid and lower lung zone airspace opacification with large left and small right pleural effusions. There may be consolidation in the left lower lobe. IMPRESSION: 1. Congestive heart failure. 2. Possible left lower lobe consolidation. Difficult to exclude pneumonia. Electronically Signed   By: Lorin Picket M.D.   On: 03/10/2022 11:41    EKG: Independently reviewed.  Atrial fibrillation at 110 bpm.  Left bundle branch block with repolarization abnormality.  Nonspecific T wave changes.  Similar to previous.  Assessment/Plan Principal Problem:   Acute CHF ([place]) Active Problems:   Dementia with behavioral disturbance (HCC)   Essential hypertension   COPD (chronic obstructive pulmonary disease) (H[facility])   GERD   Hypothyroidism   Chronic bilateral low back pain   Hyperlipidemia   Anxiety   Intermediate stage nonexudative age-related macular degeneration of right eye   Stage 3b chronic kidney disease  (HCC)   Persistent atrial fibrillation (HCC)   Acute CHF Bilateral pulmonary effusions > Patient presenting with progressive shortness of breath and edema, not responding to increased Lasix dose > Per chart review, no known history of previous CHF. > Chest x-ray and CT consistent with CHF/pulmonary edema as well as bilateral effusions. > Does have shortness of breath no oxygen requirement at this time.  Will start with IV diuresis. - Monitor on telemetry - Continue with IV Lasix 60 mg twice daily - Echocardiogram - Check magnesium - Trend renal function and electrolytes  CKD 3B Hyponatremia > Patient presenting with creatinine of 1.24 near baseline of 1.1.  Sodium 129 representing mild hyponatremia likely hypervolemic in nature.  Will monitor response to diuresis. - Diuresis as above - Trend renal function and electrolytes  Atrial fibrillation > Known history of A-fib not currently on rate control or anticoagulation. - Continue to monitor  Hypothyroidism - Continue home Synthroid  Hyperlipidemia - Continue home atorvastatin  Intermittent abdominal pain Decreased p.o. intake > Follows with GI for this.  Daughter states patient needs to be sitting upright when she eats or has some issues swallowing. - Continue home Zofran BID with meals - Continue daily simethicone with breakfast - Bland diet, water only for beverage - As needed Pepcid  GERD - Continue home PPI  Hypertension - Lasix as above - Will hold amlodipine while initiating IV Lasix  CAD > Chart review 7 history of this with MI in 1998. - Continue home aspirin, atorvastatin  COPD - Replace home Stiolto with formulary Brovana and Incruse - Continue home Pulmicort - Continue as needed albuterol  History of stroke - Continue home aspirin and atorvastatin  Dementia with behavior disturbance - Continue home fluoxetine and Risperdal  Conjunctivitis - Continue erythromycin ointment  DVT  prophylaxis: Lovenox Code Status:   DNR, Family would want Intubation trial.  Family Communication:  Updated at bedside  Disposition Plan:   Patient is from:  Home  Anticipated DC to:  Home  Anticipated DC date:  2 to 5 days  Anticipated DC barriers: None  Consults called:  None Admission status:  Inpatient, telemetry  Severity of Illness: The appropriate patient status for this patient is INPATIENT. Inpatient status is judged to be reasonable and necessary in order to provide the required intensity of service to ensure the  patient's safety. The patient's presenting symptoms, physical exam findings, and initial radiographic and laboratory data in the context of their chronic comorbidities is felt to place them at high risk for further clinical deterioration. Furthermore, it is not anticipated that the patient will be medically stable for discharge from the hospital within 2 midnights of admission.   * I certify that at the point of admission it is my clinical judgment that the patient will require inpatient hospital care spanning beyond 2 midnights from the point of admission due to high intensity of service, high risk for further deterioration and high frequency of surveillance required.Marcelyn Bruins MD [facility]  How to contact the [facility] Attending or Consulting provider [place] or covering provider during after hours [place], for this patient?   Check the care team in [facility] and look for a) attending/consulting [facility] provider listed and b) the [facility] team listed Log into [email or web address] and use [place]'s universal password to access. If you do not have the password, please contact the hospital operator. Locate the [facility] provider you are looking for under [facility] and page to a number that you can be directly reached. If you still have difficulty reaching the provider, please page the [facility] (Director on Call) for the Hospitalists listed on amion for assistance.  03/10/2022,  6:08 PM

## 2022-03-10 NOTE — Progress Notes (Signed)
Transition of Care [facility]) - Emergency Department Mini Assessment   Patient Details  Name: Donna Arias MRN: [id number] Date of Birth: Oct 28, 1926  Transition of Care [facility]) CM/SW Contact:    Fuller Mandril, RN Phone Number: 03/10/2022, 2:40 PM   Clinical Narrative: Jason Coop. Clydene Laming, RN, BSN, [place] [phone] RNCM spoke with pt and daughter at bedside regarding discharge planning for [place]. Offered pt medicare.gov list of home health agencies to choose from.  Family chose [facility] to render RNservices. [facility] [place] notified. Patient made aware that Alvis Lemmings will be in contact in 24-48 hours.   DME needs identified at this time include hospital bed.  Family chose [facility] to supply hospital bed.  Family notified that hospital bed would be delivered after [facility] confirms space is cleared.  Navi Erber J. Clydene Laming, RN, BSN, NCM  Transitions of Care  Nurse Case [place] [facility]  Operative Services  [phone]     ED Mini Assessment: What brought you to the Emergency Department? : (P) "I couldn't catch my breath."  Barriers to Discharge: (P) Continued Medical Work up     [employer] of departure: (P) Diplomatic Services operational officer  Interventions which prevented an admission or readmission: [place] or Services, DME Provided    Patient Contact and Communications     Spoke with: (P) [name] (bedside) Contact Date: (P) 03/10/22,   Contact time: (P) 0200 Contact Phone Number: (P) [phone]      CMS Medicare.gov Compare Post Acute Care list provided to:: (P) Patient Represenative (must comment) [name]) Choice offered to / list presented to : (P) Adult Children  Admission diagnosis:  SOB Patient Active Problem List   Diagnosis Date Noted   Nausea 02/10/2022   Constipation 02/10/2022   N&V (nausea and vomiting) 01/11/2022   Persistent atrial fibrillation ([place]) 01/11/2022   Stage 3b chronic kidney disease ([place]) 06/02/2021   Allergic rhinitis  04/20/2021   Dysphagia 04/30/2020   Impacted cerumen of right ear 04/18/2020   Exudative age-related macular degeneration of right eye with active choroidal neovascularization ([place]) 12/03/2019   Exudative age-related macular degeneration of left eye with inactive choroidal neovascularization ([place]) 06/04/2019   Posterior vitreous detachment of right eye 06/04/2019   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   Advanced nonexudative age-related macular degeneration of left eye with subfoveal involvement 06/04/2019   Intermediate stage nonexudative age-related macular degeneration of right eye 06/04/2019   Excessive cerumen in ear canal, bilateral 11/06/2018   Ear fullness, left 01/16/2018   Multiple atypical nevi 06/23/2017   Ankle edema, bilateral 05/02/2017   Postnasal drip 12/13/2016   Orthostatic hypotension 11/22/2016   Hyperlipidemia 11/22/2016   Anxiety 11/22/2016   Dysarthria 11/22/2016   Weight gain 11/18/2016   Edema of right foot 11/18/2016   Age-related osteoporosis without current pathological fracture 11/16/2016   Cough 11/16/2016   Healthcare maintenance 10/14/2016   Diaper dermatitis 06/10/2016   Antibiotic-induced yeast infection 06/10/2016   Hearing loss secondary to cerumen impaction, bilateral 06/10/2016   Chronic bilateral low back pain 04/19/2016   Chronic neck pain 04/19/2016   Medication refill 04/14/2016   Joint laxity of right knee 04/14/2016   Hypothyroidism 02/04/2016   Upper back pain 08/01/2009   Transient cerebral ischemia 09/22/2007   Dementia with behavioral disturbance ([place]) 10/07/2006   Essential hypertension 10/07/2006   COPD (chronic obstructive pulmonary disease) ([place]) 10/07/2006   GERD 10/07/2006   PCP:  Velva Harman, PA Pharmacy:   [place], [place] -  [place] [address] Phone: [phone] Fax: [phone]

## 2022-03-10 NOTE — ED Triage Notes (Signed)
Shortness of breath intermittently, unable to perform normal tasks today. Expiratory wheeze. Hx of COPD. Was 88% on room air, up to 94% after 69m albuterol. Family concerned about fluid build up across lower back.

## 2022-03-11 ENCOUNTER — Inpatient Hospital Stay (HOSPITAL_COMMUNITY): Payer: Medicare HMO

## 2022-03-11 DIAGNOSIS — I509 Heart failure, unspecified: Secondary | ICD-10-CM | POA: Diagnosis not present

## 2022-03-11 LAB — CBC
HCT: 34.1 % — ABNORMAL LOW (ref 36.0–46.0)
Hemoglobin: 11.7 g/dL — ABNORMAL LOW (ref 12.0–15.0)
MCH: 30 pg (ref 26.0–34.0)
MCHC: 34.3 g/dL (ref 30.0–36.0)
MCV: 87.4 fL (ref 80.0–100.0)
Platelets: 172 10*3/uL (ref 150–400)
RBC: 3.9 MIL/uL (ref 3.87–5.11)
RDW: 15.9 % — ABNORMAL HIGH (ref 11.5–15.5)
WBC: 5.6 10*3/uL (ref 4.0–10.5)
nRBC: 0 % (ref 0.0–0.2)

## 2022-03-11 LAB — ECHOCARDIOGRAM COMPLETE
AV Vena cont: 0.4 cm
Calc EF: 22.5 %
Est EF: <20
P 1/2 time: 597 msec
Radius: 0.3 cm
S' Lateral: 4.3 cm
Single Plane A2C EF: 27.5 %
Single Plane A4C EF: 15.8 %

## 2022-03-11 LAB — COMPREHENSIVE METABOLIC PANEL
ALT: 12 U/L (ref 0–44)
AST: 19 U/L (ref 15–41)
Albumin: 3.2 g/dL — ABNORMAL LOW (ref 3.5–5.0)
Alkaline Phosphatase: 69 U/L (ref 38–126)
Anion gap: 12 (ref 5–15)
BUN: 11 mg/dL (ref 8–23)
CO2: 23 mmol/L (ref 22–32)
Calcium: 8.9 mg/dL (ref 8.9–10.3)
Chloride: 95 mmol/L — ABNORMAL LOW (ref 98–111)
Creatinine, Ser: 1.52 mg/dL — ABNORMAL HIGH (ref 0.44–1.00)
GFR, Estimated: 32 mL/min — ABNORMAL LOW (ref >60)
Glucose, Bld: 98 mg/dL (ref 70–99)
Potassium: 3.8 mmol/L (ref 3.5–5.1)
Sodium: 130 mmol/L — ABNORMAL LOW (ref 135–145)
Total Bilirubin: 1.1 mg/dL (ref 0.3–1.2)
Total Protein: 5.9 g/dL — ABNORMAL LOW (ref 6.5–8.1)

## 2022-03-11 LAB — MAGNESIUM: Magnesium: 1.8 mg/dL (ref 1.7–2.4)

## 2022-03-11 MED ORDER — FUROSEMIDE 10 MG/ML IJ SOLN
40.0000 mg | Freq: Two times a day (BID) | INTRAMUSCULAR | Status: DC
  Administered 2022-03-11 – 2022-03-13 (×5): 40 mg via INTRAVENOUS
  Filled 2022-03-11 (×5): qty 4

## 2022-03-11 MED ORDER — SENNOSIDES-DOCUSATE SODIUM 8.6-50 MG PO TABS
1.0000 | ORAL_TABLET | Freq: Two times a day (BID) | ORAL | Status: DC
  Administered 2022-03-11 – 2022-03-15 (×8): 1 via ORAL
  Filled 2022-03-11 (×8): qty 1

## 2022-03-11 MED ORDER — PERFLUTREN LIPID MICROSPHERE
1.0000 mL | INTRAVENOUS | Status: AC | PRN
  Administered 2022-03-11: 4 mL via INTRAVENOUS

## 2022-03-11 NOTE — Evaluation (Signed)
Physical Therapy Evaluation Patient Details Name: Donna Arias MRN: [id number] DOB: 07-23-1926 Today's Date: 03/11/2022  History of Present Illness  Pt is a 89 y.o. female who presented 03/10/22 with SOB. Pt admitted with acute CHF and bil pulmonary effusions. PMH: CKD 3B, afib, hypothyroidism, HLD, GERD, HTN, CAD s/p MI in 1998, COPD, anxiety, chronic pain, CVA, macular degeneration, dementia   Clinical Impression  Pt presents with condition above and deficits mentioned below, see PT Problem List. PTA, she was living with her daughter and son-in-law in a 1-level house with a ramped entrance, reliant on her family to assist with ADLs and all functional mobility with HHA. Pt has difficulty understanding how to safely utilize a RW, thus her daughter just hugs onto the pt for stability with household distance gait bouts at home. Currently, pt is demonstrating increased confusion compared to her reported baseline, likely due to being out of her familiar environment with her hx of dementia. In addition, pt is demonstrating deficits in overall strength, coordination, power, balance, and activity tolerance. She required up to [facility] for bed mobility, transfers, and to take a few steps in the room with UE support. Pt will likely do better physically and cognitively once back in her familiar environment of home with her daughter, thus recommending d/c home with HHPT follow-up and [place] services. Will continue to follow acutely.       Recommendations for follow up therapy are one component of a multi-disciplinary discharge planning process, led by the attending physician.  Recommendations may be updated based on patient status, additional functional criteria and insurance authorization.  Follow Up Recommendations Home health PT (& [facility] services)      Assistance Recommended at Discharge Frequent or constant Supervision/Assistance  Patient can return home with the following  A lot of help with walking and/or  transfers;A lot of help with bathing/dressing/bathroom;Assistance with cooking/housework;Direct supervision/assist for medications management;Direct supervision/assist for financial management;Help with stairs or ramp for entrance;Assist for transportation    Equipment Recommendations BSC/3in1;Hospital bed  Recommendations for Other Services       Functional Status Assessment Patient has had a recent decline in their functional status and demonstrates the ability to make significant improvements in function in a reasonable and predictable amount of time.     Precautions / Restrictions Precautions Precautions: Fall;Other (comment) Precaution Comments: watch HR Restrictions Weight Bearing Restrictions: No      Mobility  Bed Mobility Overal bed mobility: Needs Assistance Bed Mobility: Supine to Sit     Supine to sit: Min assist, Mod assist, HOB elevated     General bed mobility comments: Pt initially needing minA to ascend trunk but needed intermittent modA to scoot hips to EOB, HOB elevated.    Transfers Overall transfer level: Needs assistance Equipment used: Rolling walker (2 wheels), 1 person hand held assist Transfers: Sit to/from Stand, Bed to chair/wheelchair/BSC Sit to Stand: Mod assist   Step pivot transfers: Mod assist       General transfer comment: x2 transfers to stand from EOB to RW and x1 to stand with HHA on therapist anterior to her as pt was having difficulty understanding how to utilize the RW as she relies on HHA at home instead. ModA to power up to stand, shift weight anteriorly, and gain stability each rep, with pt leaning legs against bed and needing cues to stand upright and step anteriorly. ModA to direct buttocks to R from bed to chair with pt trying to sit prematurely.    Ambulation/Gait  Ambulation/Gait assistance: Mod assist Gait Distance (Feet): 2 Feet (x2 bouts of ~2 ft each bout) Assistive device: Rolling walker (2 wheels), 1 person hand held  assist Gait Pattern/deviations: Step-to pattern, Decreased step length - right, Decreased step length - left, Decreased stride length, Shuffle, Trunk flexed, Leaning posteriorly Gait velocity: reduced Gait velocity interpretation: <1.31 ft/sec, indicative of household ambulator   General Gait Details: Pt with very small, slow, shuffling unsteady steps along/around EOB, 1x with RW and 1x with HHA. She needs verbal and tactile cues to shift her weight anteriorly and improve her posture as she tends to maintain a very flexed and kyphotic posture. Poor comprehension on how to utilize a RW.  Stairs            Wheelchair Mobility    Modified Rankin (Stroke Patients Only)       Balance Overall balance assessment: Needs assistance Sitting-balance support: No upper extremity supported, Feet supported Sitting balance-Leahy Scale: Fair Sitting balance - Comments: Static sitting EOB with min guard for safety Postural control: Posterior lean Standing balance support: Bilateral upper extremity supported, Reliant on assistive device for balance, During functional activity Standing balance-Leahy Scale: Poor Standing balance comment: Reliant on UE support and external physical assistance.                             Pertinent Vitals/Pain Pain Assessment Pain Assessment: Faces Faces Pain Scale: Hurts little more Pain Location: back Pain Descriptors / Indicators: Discomfort, Grimacing Pain Intervention(s): Limited activity within patient's tolerance, Monitored during session, Repositioned    Home Living Family/patient expects to be discharged to:: Private residence Living Arrangements: Children (daughter and son-in-law) Available Help at Discharge: Family;Available 24 hours/day Type of Home: House Home Access: Ramped entrance       Home Layout: One level Home Equipment: Conservation officer, nature (2 wheels);Shower seat;Wheelchair - manual;Grab bars - toilet      Prior Function Prior  Level of Function : Needs assist  Cognitive Assist : ADLs (cognitive)     Physical Assist : Mobility (physical);ADLs (physical) Mobility (physical): Bed mobility;Transfers;Gait   Mobility Comments: Daughter assists pt as needed with bed mobility and pt never gets up and walks without assistance from her daughter. Daughter tends to hug onto pt to provide stability with walking as pt has difficulty utilizing an AD. ADLs Comments: Daughter manages meds, finances, and assists with ADLs     Hand Dominance        Extremity/Trunk Assessment   Upper Extremity Assessment Upper Extremity Assessment: Defer to OT evaluation    Lower Extremity Assessment Lower Extremity Assessment: Generalized weakness (edema noted bil)    Cervical / Trunk Assessment Cervical / Trunk Assessment: Kyphotic  Communication   Communication: HOH  Cognition Arousal/Alertness: Awake/alert Behavior During Therapy: Impulsive (mildly) Overall Cognitive Status: History of cognitive impairments - at baseline                                 General Comments: Pt can be mildly impulsive to try to sit prematurely. Hx of dementia but typically knows the date, location, and situation when at home. Pt appears more confused than baseline currently, likely due to being out of her familiar environment.        General Comments General comments (skin integrity, edema, etc.): VSS on RA    Exercises     Assessment/Plan    PT Assessment  Patient needs continued PT services  PT Problem List Decreased strength;Decreased activity tolerance;Decreased balance;Decreased mobility;Decreased cognition;Decreased coordination;Decreased knowledge of use of DME;Decreased safety awareness;Cardiopulmonary status limiting activity       PT Treatment Interventions DME instruction;Gait training;Functional mobility training;Therapeutic activities;Therapeutic exercise;Neuromuscular re-education;Balance training;Patient/family  education;Cognitive remediation;Wheelchair mobility training    PT Goals (Current goals can be found in the Care Plan section)  Acute Rehab PT Goals Patient Stated Goal: to not fall PT Goal Formulation: With patient/family Time For Goal Achievement: 03/25/22 Potential to Achieve Goals: Fair    Frequency Min 3X/week     Co-evaluation               AM-PAC PT "6 Clicks" Mobility  Outcome Measure Help needed turning from your back to your side while in a flat bed without using bedrails?: A Little Help needed moving from lying on your back to sitting on the side of a flat bed without using bedrails?: A Lot Help needed moving to and from a bed to a chair (including a wheelchair)?: A Lot Help needed standing up from a chair using your arms (e.g., wheelchair or bedside chair)?: A Lot Help needed to walk in hospital room?: Total Help needed climbing 3-5 steps with a railing? : Total 6 Click Score: 11    End of Session Equipment Utilized During Treatment: Gait belt Activity Tolerance: Patient tolerated treatment well Patient left: in chair;with call bell/phone within reach;with chair alarm set Nurse Communication: Mobility status PT Visit Diagnosis: Unsteadiness on feet (R26.81);Other abnormalities of gait and mobility (R26.89);Muscle weakness (generalized) (M62.81);Difficulty in walking, not elsewhere classified (R26.2)    Time: [id number] PT Time Calculation (min) (ACUTE ONLY): 24 min   Charges:   PT Evaluation $PT Eval Moderate Complexity: 1 Mod PT Treatments $Therapeutic Activity: 8-22 mins        Moishe Spice, PT, DPT Acute Rehabilitation Services  Office: [phone]   Orvan Falconer 03/11/2022, 2:10 PM

## 2022-03-11 NOTE — Progress Notes (Signed)
Heart Failure Navigator Progress Note  Assessed for Heart & Vascular TOC clinic readiness.  Patient does not meet criteria due to history of dementia.   Navigator available for reassessment of patient.   Kerby Nora, PharmD, BCPS Heart Failure Stewardship Pharmacist Phone [phone]

## 2022-03-11 NOTE — Progress Notes (Signed)
Pt had a 5 beat run of v tach. Pt asymptomatic.

## 2022-03-11 NOTE — Progress Notes (Signed)
PROGRESS NOTE    Donna Arias  [id number] DOB: April 17, 1926 DOA: 03/10/2022 PCP: Velva Harman, PA  89/F with history of atrial fibrillation, CKD 3B, hypothyroidism, hypertension, CAD, COPD, anxiety, chronic pain, CVA, dementia presented to the ED with shortness of breath, recently had increasing lower extremity edema, Lasix dose increased also had some ongoing abdominal discomfort as well. -Workup in the ED noted stable vital signs, creatinine 1.2, BNP 997, chest x-ray with CHF, CTA chest noted cardiomegaly, large bilateral pleural effusions, CT abdomen pelvis was unremarkable   Subjective: Feels better, breathing starting to improve  Assessment and Plan:  Acute CHF, likely diastolic Bilateral pulmonary effusions -Follow-up echocardiogram -Continue IV Lasix today -Conservative management considering advanced age, memory loss and poor functional status -Ambulate, PT OT eval   CKD 3B Hyponatremia -Mild AKI, monitor with diuresis   Atrial fibrillation > Known history of A-fib not currently on rate control or anticoagulation.  On account of age and falls   Hypothyroidism - Continue home Synthroid   Hyperlipidemia - Continue home atorvastatin   Intermittent abdominal pain Decreased p.o. intake > Follows with GI for this.  Daughter states patient needs to be sitting upright when she eats or has some issues swallowing. - Continue home Zofran BID with meals - Continue daily simethicone with breakfast - Bland diet, water only for beverage - As needed Pepcid   GERD - Continue home PPI   Hypertension - Lasix as above - Will hold amlodipine while initiating IV Lasix   CAD > Chart review 7 history of this with MI in 1998. - Continue home aspirin, atorvastatin   COPD - Replace home Stiolto with formulary Brovana and Incruse - Continue home Pulmicort - Continue as needed albuterol   History of stroke - Continue home aspirin and atorvastatin   Dementia with  behavior disturbance - Continue home fluoxetine and Risperdal   Conjunctivitis - Continue erythromycin ointment   DVT prophylaxis:      Lovenox Code Status:              DNR, Family would want Intubation trial.  Family Communication: No family at bedside Disposition Plan:   Consultants:    Procedures:   Antimicrobials:    Objective: Vitals:   03/11/22 0800 03/11/22 1137 03/11/22 1230 03/11/22 1306  BP: 123/78 112/65 125/82   Pulse: 95 80 81   Resp: (!) 25 (!) 21 (!) 29 (!) 22  Temp: 98.1 F (36.7 C) (!) 97.5 F (36.4 C) 98.1 F (36.7 C)   TempSrc: Oral Oral Oral   SpO2: 95% 91% 92%     Intake/Output Summary (Last 24 hours) at 03/11/2022 1547 Last data filed at 03/11/2022 1430 Gross per 24 hour  Intake 743 ml  Output 820 ml  Net -77 ml   There were no vitals filed for this visit.  Examination:  General exam: Chronically ill elderly female sitting up in bed, awake alert oriented to self and place, cognitive deficits Respiratory system: Decreased breath sounds at the bases Cardiovascular system: S1 & S2 heard, irregularly irregular Abd: nondistended, soft and nontender.Normal bowel sounds heard. Central nervous system: Alert and oriented. No focal neurological deficits. Extremities: 1+ edema Skin: No rashes Psychiatry: Flat affect    Data Reviewed:   CBC: Recent Labs  Lab 03/10/22 1140 03/11/22 0303  WBC 7.5 5.6  NEUTROABS 6.2  --   HGB 11.6* 11.7*  HCT 35.8* 34.1*  MCV 91.8 87.4  PLT 168 [id number]   Basic Metabolic Panel: Recent Labs  Lab 03/08/22 1057 03/10/22 1140 03/11/22 0303  NA 135 129* 130*  K 4.5 3.8 3.8  CL 97 94* 95*  CO2 21 23 23  $ GLUCOSE 118* 102* 98  BUN 10 9 11  $ CREATININE 1.09* 1.24* 1.52*  CALCIUM 9.7 8.8* 8.9  MG  --   --  1.8   GFR: Estimated Creatinine Clearance: 19.1 mL/min (A) (by C-G formula based on SCr of 1.52 mg/dL (H)). Liver Function Tests: Recent Labs  Lab 03/10/22 1140 03/11/22 0303  AST 25 19  ALT 14 12   ALKPHOS 72 69  BILITOT 1.2 1.1  PROT 5.7* 5.9*  ALBUMIN 2.9* 3.2*   Recent Labs  Lab 03/10/22 1140  LIPASE 27   No results for input(s): "AMMONIA" in the last 168 hours. Coagulation Profile: No results for input(s): "INR", "PROTIME" in the last 168 hours. Cardiac Enzymes: No results for input(s): "CKTOTAL", "CKMB", "CKMBINDEX", "TROPONINI" in the last 168 hours. BNP (last 3 results) No results for input(s): "PROBNP" in the last 8760 hours. HbA1C: No results for input(s): "HGBA1C" in the last 72 hours. CBG: No results for input(s): "GLUCAP" in the last 168 hours. Lipid Profile: No results for input(s): "CHOL", "HDL", "LDLCALC", "TRIG", "CHOLHDL", "LDLDIRECT" in the last 72 hours. Thyroid Function Tests: No results for input(s): "TSH", "T4TOTAL", "FREET4", "T3FREE", "THYROIDAB" in the last 72 hours. Anemia Panel: No results for input(s): "VITAMINB12", "FOLATE", "FERRITIN", "TIBC", "IRON", "RETICCTPCT" in the last 72 hours. Urine analysis:    Component Value Date/Time   COLORURINE STRAW (A) 03/10/2022 1655   APPEARANCEUR CLEAR 03/10/2022 1655   LABSPEC 1.010 03/10/2022 1655   PHURINE 5.0 03/10/2022 1655   GLUCOSEU NEGATIVE 03/10/2022 1655   HGBUR SMALL (A) 03/10/2022 1655   HGBUR negative 02/26/2008 0950   BILIRUBINUR NEGATIVE 03/10/2022 1655   BILIRUBINUR negative 12/25/2021 1104   BILIRUBINUR Negative 12/01/2020 1403   KETONESUR NEGATIVE 03/10/2022 1655   PROTEINUR NEGATIVE 03/10/2022 1655   UROBILINOGEN 0.2 12/25/2021 1104   UROBILINOGEN 0.2 02/26/2008 0950   NITRITE NEGATIVE 03/10/2022 1655   LEUKOCYTESUR NEGATIVE 03/10/2022 1655   Sepsis Labs: @LABRCNTIP$ (procalcitonin:4,lacticidven:4)  )No results found for this or any previous visit (from the past 240 hour(s)).   Radiology Studies: CT Angio Chest PE W and/or Wo Contrast  Result Date: 03/10/2022 CLINICAL DATA:  Chest pain and right lower quadrant abdominal pain. EXAM: CT ANGIOGRAPHY CHEST CT ABDOMEN AND  PELVIS WITH CONTRAST TECHNIQUE: Multidetector CT imaging of the chest was performed using the standard protocol during bolus administration of intravenous contrast. Multiplanar CT image reconstructions and MIPs were obtained to evaluate the vascular anatomy. Multidetector CT imaging of the abdomen and pelvis was performed using the standard protocol during bolus administration of intravenous contrast. RADIATION DOSE REDUCTION: This exam was performed according to the departmental dose-optimization program which includes automated exposure control, adjustment of the mA and/or kV according to patient size and/or use of iterative reconstruction technique. CONTRAST:  47m OMNIPAQUE IOHEXOL 350 MG/ML SOLN COMPARISON:  CT abdomen/pelvis 01/09/2022 and prior chest CT 06/07/2020 FINDINGS: CTA CHEST FINDINGS Cardiovascular: The heart is enlarged with four-chamber enlargement. No pericardial effusion. There is moderate tortuosity and calcification of the thoracic aorta but no aneurysm or dissection. The pulmonary arterial tree is fairly well opacified. The main pulmonary arteries are enlarged which may suggest pulmonary hypertension. No filling defects to suggest pulmonary embolism. Mediastinum/Nodes: Borderline enlarged mediastinal and hilar lymph nodes likely due to the underlying lung findings. The esophagus is grossly normal. Lungs/Pleura: Large bilateral pleural effusions. With mild  overlying right lower lobe atelectasis and moderate left lower lobe atelectasis. Hazy interstitial changes and ground-glass opacity likely reflecting asymmetric pulmonary edema. No pulmonary lesions. Musculoskeletal: No significant bony findings. Review of the MIP images confirms the above findings. CT ABDOMEN and PELVIS FINDINGS Hepatobiliary: No hepatic lesions or intrahepatic biliary dilatation. The gallbladder is surgically absent. No common bile duct dilatation. Pancreas: No mass, inflammation ductal dilatation. Moderate age related  pancreatic atrophy. Spleen: Normal size.  No focal lesions. Adrenals/Urinary Tract: The adrenal glands are normal. Mild renal cortical thinning but no renal lesions or hydronephrosis. No collecting system abnormalities on the delayed images. The bladder is. Stomach/Bowel: The stomach, duodenum, small and colon are unremarkable. No acute inflammatory process, mass lesions obstructive findings. The terminal ileum and appendix are normal. Scattered colonic diverticulosis but no findings for acute diverticulitis. Vascular/Lymphatic: Advanced atherosclerotic calcification involving the aorta and branch vessels but no aneurysm dissection. The major venous structures are patent. No mesenteric or retroperitoneal mass or adenopathy. There is a stable rim calcified density in the left upper pelvis adjacent to the descending colon which could be an area of old fat necrosis or calcified lymph node. Reproductive: The uterus and ovaries are unremarkable. Other: Small amount of free pelvic fluid is noted. There is also mild diffuse body wall edema. Musculoskeletal: No significant bony findings. Review of the MIP images confirms the above findings. IMPRESSION: 1. No CT findings for pulmonary embolism. 2. Cardiac enlargement. 3. Large bilateral pleural effusions with overlying right lower lobe atelectasis and moderate left lower lobe atelectasis. 4. Hazy interstitial changes and ground-glass opacity likely reflecting asymmetric pulmonary edema. 5. No acute abdominal/pelvic findings, mass lesions or adenopathy. 6. Advanced atherosclerotic calcification involving the thoracic and abdominal aorta and branch vessels. 7. Aortic atherosclerosis. Aortic Atherosclerosis (ICD10-I70.0). Electronically Signed   By: Marijo Sanes M.D.   On: 03/10/2022 16:27   CT ABDOMEN PELVIS W CONTRAST  Result Date: 03/10/2022 CLINICAL DATA:  Chest pain and right lower quadrant abdominal pain. EXAM: CT ANGIOGRAPHY CHEST CT ABDOMEN AND PELVIS WITH CONTRAST  TECHNIQUE: Multidetector CT imaging of the chest was performed using the standard protocol during bolus administration of intravenous contrast. Multiplanar CT image reconstructions and MIPs were obtained to evaluate the vascular anatomy. Multidetector CT imaging of the abdomen and pelvis was performed using the standard protocol during bolus administration of intravenous contrast. RADIATION DOSE REDUCTION: This exam was performed according to the departmental dose-optimization program which includes automated exposure control, adjustment of the mA and/or kV according to patient size and/or use of iterative reconstruction technique. CONTRAST:  91m OMNIPAQUE IOHEXOL 350 MG/ML SOLN COMPARISON:  CT abdomen/pelvis 01/09/2022 and prior chest CT 06/07/2020 FINDINGS: CTA CHEST FINDINGS Cardiovascular: The heart is enlarged with four-chamber enlargement. No pericardial effusion. There is moderate tortuosity and calcification of the thoracic aorta but no aneurysm or dissection. The pulmonary arterial tree is fairly well opacified. The main pulmonary arteries are enlarged which may suggest pulmonary hypertension. No filling defects to suggest pulmonary embolism. Mediastinum/Nodes: Borderline enlarged mediastinal and hilar lymph nodes likely due to the underlying lung findings. The esophagus is grossly normal. Lungs/Pleura: Large bilateral pleural effusions. With mild overlying right lower lobe atelectasis and moderate left lower lobe atelectasis. Hazy interstitial changes and ground-glass opacity likely reflecting asymmetric pulmonary edema. No pulmonary lesions. Musculoskeletal: No significant bony findings. Review of the MIP images confirms the above findings. CT ABDOMEN and PELVIS FINDINGS Hepatobiliary: No hepatic lesions or intrahepatic biliary dilatation. The gallbladder is surgically absent. No common bile duct  dilatation. Pancreas: No mass, inflammation ductal dilatation. Moderate age related pancreatic atrophy.  Spleen: Normal size.  No focal lesions. Adrenals/Urinary Tract: The adrenal glands are normal. Mild renal cortical thinning but no renal lesions or hydronephrosis. No collecting system abnormalities on the delayed images. The bladder is. Stomach/Bowel: The stomach, duodenum, small and colon are unremarkable. No acute inflammatory process, mass lesions obstructive findings. The terminal ileum and appendix are normal. Scattered colonic diverticulosis but no findings for acute diverticulitis. Vascular/Lymphatic: Advanced atherosclerotic calcification involving the aorta and branch vessels but no aneurysm dissection. The major venous structures are patent. No mesenteric or retroperitoneal mass or adenopathy. There is a stable rim calcified density in the left upper pelvis adjacent to the descending colon which could be an area of old fat necrosis or calcified lymph node. Reproductive: The uterus and ovaries are unremarkable. Other: Small amount of free pelvic fluid is noted. There is also mild diffuse body wall edema. Musculoskeletal: No significant bony findings. Review of the MIP images confirms the above findings. IMPRESSION: 1. No CT findings for pulmonary embolism. 2. Cardiac enlargement. 3. Large bilateral pleural effusions with overlying right lower lobe atelectasis and moderate left lower lobe atelectasis. 4. Hazy interstitial changes and ground-glass opacity likely reflecting asymmetric pulmonary edema. 5. No acute abdominal/pelvic findings, mass lesions or adenopathy. 6. Advanced atherosclerotic calcification involving the thoracic and abdominal aorta and branch vessels. 7. Aortic atherosclerosis. Aortic Atherosclerosis (ICD10-I70.0). Electronically Signed   By: Marijo Sanes M.D.   On: 03/10/2022 16:27   DG Chest Portable 1 View  Result Date: 03/10/2022 CLINICAL DATA:  Shortness of breath, decreased O2 sats. EXAM: PORTABLE CHEST 1 VIEW COMPARISON:  02/01/2022 and CT chest 04/07/2020. FINDINGS: Trachea is  midline. Heart is enlarged, stable. Thoracic aorta is calcified. Mid and lower lung zone airspace opacification with large left and small right pleural effusions. There may be consolidation in the left lower lobe. IMPRESSION: 1. Congestive heart failure. 2. Possible left lower lobe consolidation. Difficult to exclude pneumonia. Electronically Signed   By: Lorin Picket M.D.   On: 03/10/2022 11:41     Scheduled Meds:  arformoterol  15 mcg Nebulization BID   And   umeclidinium bromide  1 puff Inhalation Daily   aspirin  81 mg Oral QPM   atorvastatin  10 mg Oral Daily   budesonide  0.25 mg Nebulization BID   enoxaparin (LOVENOX) injection  30 mg Subcutaneous Q24H   erythromycin  1 Application Left Eye QHS   FLUoxetine  10 mg Oral Daily   furosemide  60 mg Intravenous BID   levothyroxine  62.5 mcg Oral Q0600   ondansetron  4 mg Oral BID WC   pantoprazole  40 mg Oral Daily   risperiDONE  1 mg Oral BID   simethicone  80 mg Oral Q breakfast   sodium chloride flush  3 mL Intravenous Q12H   Continuous Infusions:   LOS: 1 day    Time spent: 38mn    PDomenic Polite MD [facility]   03/11/2022, 3:47 PM

## 2022-03-11 NOTE — TOC Progression Note (Addendum)
Transition of Care [facility]) - Progression Note    Patient Details  Name: Donna Arias MRN: [id number] Date of Birth: 29-Aug-1926  Transition of Care [facility]) CM/SW Contact  Graves-Bigelow, Ocie Cornfield, RN Phone Number: 03/11/2022, 3:17 PM  Clinical Narrative:  Case Manager discussed disposition plan of care with the daughter. The plan will be to return to daughters home. Previous Case Manager ordered a hospital bed and the writer called [employer] for [facility] to be delivered to the home. Alvis Lemmings will service for [facility] RN. [employer] will call the daughter for delivery times for HB. No further needs identified at this time.    Expected Discharge Plan: [place] Barriers to Discharge: Continued Medical Work up  Expected Discharge Plan and Services   Discharge Planning Services: CM Consult   Living arrangements for the past 2 months: Single Family Home                 DME Arranged: Bedside commode DME Agency: [employer] Date DME Agency Contacted: 03/11/22 Time DME Agency Contacted: [phone] Representative spoke with at DME Agency: Gwendalyn Ege Arranged: RN     Social Determinants of Health ([place]) Interventions SDOH Screenings   Depression (PHQ2-9): Low Risk  (03/08/2022)  Tobacco Use: Medium Risk (03/10/2022)    Readmission Risk Interventions     No data to display

## 2022-03-11 NOTE — Progress Notes (Signed)
Pt transferred from ED via stretcher by [facility]. Pt is A&OX4, breathing equal and unlabored. Pt denied pain. MAE well. Pt was placed on telemetry and VS taken. POC reviewed with pt.

## 2022-03-11 NOTE — Progress Notes (Signed)
Echocardiogram 2D Echocardiogram has been performed.  Donna Arias 03/11/2022, 5:31 PM

## 2022-03-12 DIAGNOSIS — Z515 Encounter for palliative care: Secondary | ICD-10-CM

## 2022-03-12 DIAGNOSIS — N1832 Chronic kidney disease, stage 3b: Secondary | ICD-10-CM | POA: Diagnosis not present

## 2022-03-12 DIAGNOSIS — R531 Weakness: Secondary | ICD-10-CM | POA: Diagnosis not present

## 2022-03-12 DIAGNOSIS — I509 Heart failure, unspecified: Secondary | ICD-10-CM | POA: Diagnosis not present

## 2022-03-12 DIAGNOSIS — F419 Anxiety disorder, unspecified: Secondary | ICD-10-CM | POA: Diagnosis not present

## 2022-03-12 LAB — BASIC METABOLIC PANEL
Anion gap: 10 (ref 5–15)
BUN: 10 mg/dL (ref 8–23)
CO2: 28 mmol/L (ref 22–32)
Calcium: 8.6 mg/dL — ABNORMAL LOW (ref 8.9–10.3)
Chloride: 93 mmol/L — ABNORMAL LOW (ref 98–111)
Creatinine, Ser: 1.35 mg/dL — ABNORMAL HIGH (ref 0.44–1.00)
GFR, Estimated: 37 mL/min — ABNORMAL LOW (ref >60)
Glucose, Bld: 96 mg/dL (ref 70–99)
Potassium: 2.8 mmol/L — ABNORMAL LOW (ref 3.5–5.1)
Sodium: 131 mmol/L — ABNORMAL LOW (ref 135–145)

## 2022-03-12 LAB — CBC
HCT: 33.9 % — ABNORMAL LOW (ref 36.0–46.0)
Hemoglobin: 11.8 g/dL — ABNORMAL LOW (ref 12.0–15.0)
MCH: 30.5 pg (ref 26.0–34.0)
MCHC: 34.8 g/dL (ref 30.0–36.0)
MCV: 87.6 fL (ref 80.0–100.0)
Platelets: 154 10*3/uL (ref 150–400)
RBC: 3.87 MIL/uL (ref 3.87–5.11)
RDW: 16 % — ABNORMAL HIGH (ref 11.5–15.5)
WBC: 6.3 10*3/uL (ref 4.0–10.5)
nRBC: 0 % (ref 0.0–0.2)

## 2022-03-12 MED ORDER — GUAIFENESIN-DM 100-10 MG/5ML PO SYRP
5.0000 mL | ORAL_SOLUTION | ORAL | Status: DC | PRN
  Administered 2022-03-13: 5 mL via ORAL
  Filled 2022-03-12: qty 5

## 2022-03-12 MED ORDER — POTASSIUM CHLORIDE 20 MEQ PO PACK
40.0000 meq | PACK | ORAL | Status: AC
  Administered 2022-03-12 (×2): 40 meq via ORAL
  Filled 2022-03-12 (×2): qty 2

## 2022-03-12 MED ORDER — CLOTRIMAZOLE-BETAMETHASONE 1-0.05 % EX CREA
1.0000 | TOPICAL_CREAM | Freq: Two times a day (BID) | CUTANEOUS | Status: DC
  Administered 2022-03-12: 1 via TOPICAL
  Filled 2022-03-12: qty 15

## 2022-03-12 MED ORDER — MEDIHONEY WOUND/BURN DRESSING EX PSTE
1.0000 | PASTE | Freq: Every day | CUTANEOUS | Status: DC
  Administered 2022-03-12 – 2022-03-15 (×4): 1 via TOPICAL
  Filled 2022-03-12: qty 44

## 2022-03-12 NOTE — Progress Notes (Signed)
This chaplain responded to PMT NP-[name] consult for Pt. and family spiritual care. The Pt. daughter-[name] is at the bedside.  The Pt. accepted the chaplain's invitation to visit with generations of joy in her storytelling. 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  The Pt. is hopeful strength will return with her d/c home.  The Pt. accepted the chaplain's invitation for prayer and education on how to reach a chaplain over the weekend.  Chaplain Sallyanne Kuster [phone]

## 2022-03-12 NOTE — Progress Notes (Signed)
Pt's Potassium levels resulted at 2.8; attempted to notify Marlowe Sax, MD via text page & awaiting further orders.   Elaina Hoops, RN

## 2022-03-12 NOTE — Consult Note (Signed)
WOC Nurse Consult Note: Reason for Consult:Patient with dementia and acute CHF with nonhealing wound to upper gluteal folds on each side.  COnsistent with moisture associated skin damage (MASD) .  Patient is incontinent. She has female external urinary manager in place now.  Wound type:MASD Pressure Injury POA: Yes moisture and pressure are likely factors.  Measurement:Each fold:  1 cm x 0.3 cm  Wound [id number] to wound bed.  Drainage (amount, consistency, odor) minimal serosanguinous   Periwound:intact  Dressing procedure/placement/frequency: cleanse wounds to gluteal folds with soap and water and pat dry. Apply medihoney to wound bed  Cover with gauze and foam dressing. Change daily.  Will not follow at this time.  Please re-consult if needed.  Donna Ludwig MSN, RN, FNP-BC CWON Wound, Ostomy, Continence Nurse [facility] [phone] Pager [phone]

## 2022-03-12 NOTE — Progress Notes (Signed)
PROGRESS NOTE    Donna Arias  [id number] DOB: 05-17-1926 DOA: 03/10/2022 PCP: Velva Harman, PA  89/F with history of atrial fibrillation, CKD 3B, hypothyroidism, hypertension, CAD, COPD, anxiety, chronic pain, CVA, dementia presented to the ED with shortness of breath, recently had increasing lower extremity edema, Lasix dose increased also had some ongoing abdominal discomfort as well. -Workup in the ED noted stable vital signs, creatinine 1.2, BNP 997, chest x-ray with CHF, CTA chest noted cardiomegaly, large bilateral pleural effusions, CT abdomen pelvis was unremarkable   Subjective: Feels better, breathing starting to improve  Assessment and Plan:  Acute systolic CHF, severe BiV failure Bilateral pulmonary effusions -2D echo with a EF less than 20%, RV function severely decreased, moderately elevated PA systolic pressure -Urine output has been inaccurate, continue IV Lasix, check daily weights -Poor prognosis in the setting of advanced age, dementia and severe BiV failure, discussed with daughter, recommended palliative care meeting -Conservative management considering advanced age, memory loss and poor functional status -Ambulate, PT OT eval   CKD 3B Hyponatremia -Mild AKI, monitor with diuresis   Atrial fibrillation > Known history of A-fib not currently on rate control or anticoagulation.  On account of age and falls   Hypothyroidism - Continue home Synthroid   Hyperlipidemia - Continue home atorvastatin   Intermittent abdominal pain Decreased p.o. intake -Could be from BiV failure, supportive care   GERD - Continue home PPI   Hypertension - Lasix as above - Will hold amlodipine while initiating IV Lasix   CAD > Chart review 7 history of this with MI in 1998. - Continue home aspirin, atorvastatin   COPD - Replace home Stiolto with formulary Brovana and Incruse - Continue home Pulmicort - Continue as needed albuterol   History of stroke -  Continue home aspirin and atorvastatin   Dementia with behavior disturbance - Continue home fluoxetine and Risperdal   Conjunctivitis - Continue erythromycin ointment   DVT prophylaxis:      Lovenox Code Status:              DNR, Family would want Intubation trial.  Family Communication: No family at bedside, called and updated daughter Disposition Plan:   Consultants:    Procedures:   Antimicrobials:    Objective: Vitals:   03/12/22 0742 03/12/22 0743 03/12/22 0745 03/12/22 0828  BP:    114/75  Pulse:    86  Resp:    20  Temp:    (!) 96.4 F (35.8 C)  TempSrc:    Axillary  SpO2: 94% 96% 98% 96%  Weight:        Intake/Output Summary (Last 24 hours) at 03/12/2022 1239 Last data filed at 03/12/2022 [id number] Gross per 24 hour  Intake 440 ml  Output 700 ml  Net -260 ml   Filed Weights   03/12/22 0524  Weight: 60.6 kg    Examination:  General exam: Elderly chronically ill female sitting up in bed, awake alert oriented to self and place, cognitive deficits CVS: S1-S2, irregularly irregular rhythm Lungs: Clear anteriorly, diminished at the bases Abdomen: Soft, nontender, bowel sounds present Extremities: Trace edema Skin: No rashes Psychiatry: Flat affect    Data Reviewed:   CBC: Recent Labs  Lab 03/10/22 1140 03/11/22 0303 03/12/22 0257  WBC 7.5 5.6 6.3  NEUTROABS 6.2  --   --   HGB 11.6* 11.7* 11.8*  HCT 35.8* 34.1* 33.9*  MCV 91.8 87.4 87.6  PLT 168 172 [id number]   Basic Metabolic Panel:  Recent Labs  Lab 03/08/22 1057 03/10/22 1140 03/11/22 0303 03/12/22 0257  NA 135 129* 130* 131*  K 4.5 3.8 3.8 2.8*  CL 97 94* 95* 93*  CO2 21 23 23 28  $ GLUCOSE 118* 102* 98 96  BUN 10 9 11 10  $ CREATININE 1.09* 1.24* 1.52* 1.35*  CALCIUM 9.7 8.8* 8.9 8.6*  MG  --   --  1.8  --    GFR: Estimated Creatinine Clearance: 21.5 mL/min (A) (by C-G formula based on SCr of 1.35 mg/dL (H)). Liver Function Tests: Recent Labs  Lab 03/10/22 1140 03/11/22 0303  AST 25  19  ALT 14 12  ALKPHOS 72 69  BILITOT 1.2 1.1  PROT 5.7* 5.9*  ALBUMIN 2.9* 3.2*   Recent Labs  Lab 03/10/22 1140  LIPASE 27   No results for input(s): "AMMONIA" in the last 168 hours. Coagulation Profile: No results for input(s): "INR", "PROTIME" in the last 168 hours. Cardiac Enzymes: No results for input(s): "CKTOTAL", "CKMB", "CKMBINDEX", "TROPONINI" in the last 168 hours. BNP (last 3 results) No results for input(s): "PROBNP" in the last 8760 hours. HbA1C: No results for input(s): "HGBA1C" in the last 72 hours. CBG: No results for input(s): "GLUCAP" in the last 168 hours. Lipid Profile: No results for input(s): "CHOL", "HDL", "LDLCALC", "TRIG", "CHOLHDL", "LDLDIRECT" in the last 72 hours. Thyroid Function Tests: No results for input(s): "TSH", "T4TOTAL", "FREET4", "T3FREE", "THYROIDAB" in the last 72 hours. Anemia Panel: No results for input(s): "VITAMINB12", "FOLATE", "FERRITIN", "TIBC", "IRON", "RETICCTPCT" in the last 72 hours. Urine analysis:    Component Value Date/Time   COLORURINE STRAW (A) 03/10/2022 1655   APPEARANCEUR CLEAR 03/10/2022 1655   LABSPEC 1.010 03/10/2022 1655   PHURINE 5.0 03/10/2022 1655   GLUCOSEU NEGATIVE 03/10/2022 1655   HGBUR SMALL (A) 03/10/2022 1655   HGBUR negative 02/26/2008 0950   BILIRUBINUR NEGATIVE 03/10/2022 1655   BILIRUBINUR negative 12/25/2021 1104   BILIRUBINUR Negative 12/01/2020 1403   KETONESUR NEGATIVE 03/10/2022 1655   PROTEINUR NEGATIVE 03/10/2022 1655   UROBILINOGEN 0.2 12/25/2021 1104   UROBILINOGEN 0.2 02/26/2008 0950   NITRITE NEGATIVE 03/10/2022 1655   LEUKOCYTESUR NEGATIVE 03/10/2022 1655   Sepsis Labs: @LABRCNTIP$ (procalcitonin:4,lacticidven:4)  )No results found for this or any previous visit (from the past 240 hour(s)).   Radiology Studies: ECHOCARDIOGRAM COMPLETE  Result Date: 03/11/2022    ECHOCARDIOGRAM REPORT   Patient Name:   Donna Arias Date of Exam: 03/11/2022 Medical Rec #:  [id number]       Height:       63.0 in Accession #:    [id number]     Weight:       131.0 lb Date of Birth:  [birth year]      BSA:          1.615 m Patient Age:    89 years       BP:           125/82 mmHg Patient Gender: F              HR:           93 bpm. Exam Location:  Inpatient Procedure: 2D Echo, Cardiac Doppler, Color Doppler and Intracardiac            Opacification Agent Indications:    CHF  History:        Patient has no prior history of Echocardiogram examinations.                 Previous  Myocardial Infarction, COPD and Stroke; Risk                 Factors:GERD, Dyslipidemia and Hypertension.  Sonographer:    Bernadene Person RDCS Referring Phys: [id number] [place]  1. Left ventricular ejection fraction, by estimation, is <20%. The left ventricle has severely decreased function. The left ventricle demonstrates global hypokinesis. Left ventricular diastolic parameters are indeterminate.  2. Right ventricular systolic function is severely reduced. The right ventricular size is mildly enlarged. There is moderately elevated pulmonary artery systolic pressure.  3. Large pleural effusion in the left lateral region.  4. The mitral valve is normal in structure. Mild to moderate mitral valve regurgitation.  5. The aortic valve is tricuspid. Aortic valve regurgitation is mild.  6. The inferior vena cava is dilated in size with <50% respiratory variability, suggesting right atrial pressure of 15 mmHg. FINDINGS  Left Ventricle: Left ventricular ejection fraction, by estimation, is <20%. The left ventricle has severely decreased function. The left ventricle demonstrates global hypokinesis. Definity contrast agent was given IV to delineate the left ventricular endocardial borders. The left ventricular internal cavity size was normal in size. There is no left ventricular hypertrophy. Left ventricular diastolic parameters are indeterminate. Right Ventricle: The right ventricular size is mildly enlarged. Right vetricular  wall thickness was not assessed. Right ventricular systolic function is severely reduced. There is moderately elevated pulmonary artery systolic pressure. The tricuspid regurgitant velocity is 3.15 m/s, and with an assumed right atrial pressure of 15 mmHg, the estimated right ventricular systolic pressure is [id number] mmHg. Left Atrium: Left atrial size was normal in size. Right Atrium: Right atrial size was normal in size. Pericardium: Trivial pericardial effusion is present. Mitral Valve: The mitral valve is normal in structure. Mild to moderate mitral valve regurgitation. Tricuspid Valve: The tricuspid valve is normal in structure. Tricuspid valve regurgitation is mild. Aortic Valve: The aortic valve is tricuspid. Aortic valve regurgitation is mild. Aortic regurgitation PHT measures 597 msec. Pulmonic Valve: The pulmonic valve was normal in structure. Pulmonic valve regurgitation is mild. Aorta: The aortic root and ascending aorta are structurally normal, with no evidence of dilitation. Venous: The inferior vena cava is dilated in size with less than 50% respiratory variability, suggesting right atrial pressure of 15 mmHg. IAS/Shunts: No atrial level shunt detected by color flow Doppler. Additional Comments: There is a large pleural effusion in the left lateral region.  LEFT VENTRICLE PLAX 2D LVIDd:         5.30 cm LVIDs:         4.30 cm LV PW:         0.70 cm LV IVS:        0.60 cm LVOT diam:     1.80 cm LV SV:         31 LV SV Index:   19 LVOT Area:     2.54 cm  LV Volumes (MOD) LV vol d, MOD A2C: 113.0 ml LV vol d, MOD A4C: 81.9 ml LV vol s, MOD A2C: 81.9 ml LV vol s, MOD A4C: 69.0 ml LV SV MOD A2C:     31.1 ml LV SV MOD A4C:     81.9 ml LV SV MOD BP:      22.1 ml RIGHT VENTRICLE RV S prime:     7.56 cm/s TAPSE (M-mode): 1.0 cm LEFT ATRIUM             Index        RIGHT ATRIUM  Index LA diam:        3.80 cm 2.35 cm/m   RA Area:     16.50 cm LA Vol (A2C):   52.9 ml 32.75 ml/m  RA Volume:   46.20 ml   28.60 ml/m LA Vol (A4C):   49.4 ml 30.58 ml/m LA Biplane Vol: 53.0 ml 32.81 ml/m  AORTIC VALVE                   PULMONIC VALVE LVOT Vmax:         93.43 cm/s  PR End Diast Vel: 10.63 msec LVOT Vmean:        55.567 cm/s LVOT VTI:          0.121 m AI PHT:            597 msec AR Vena Contracta: 0.40 cm  AORTA Ao Root diam: 2.60 cm Ao Asc diam:  3.00 cm MR PISA:        0.57 cm  TRICUSPID VALVE MR PISA Radius: 0.30 cm   TR Peak grad:   39.7 mmHg                           TR Vmax:        315.00 cm/s                            SHUNTS                           Systemic VTI:  0.12 m                           Systemic Diam: 1.80 cm Dorris Carnes MD Electronically signed by Dorris Carnes MD Signature Date/Time: 03/11/2022/5:43:20 PM    Final    CT Angio Chest PE W and/or Wo Contrast  Result Date: 03/10/2022 CLINICAL DATA:  Chest pain and right lower quadrant abdominal pain. EXAM: CT ANGIOGRAPHY CHEST CT ABDOMEN AND PELVIS WITH CONTRAST TECHNIQUE: Multidetector CT imaging of the chest was performed using the standard protocol during bolus administration of intravenous contrast. Multiplanar CT image reconstructions and MIPs were obtained to evaluate the vascular anatomy. Multidetector CT imaging of the abdomen and pelvis was performed using the standard protocol during bolus administration of intravenous contrast. RADIATION DOSE REDUCTION: This exam was performed according to the departmental dose-optimization program which includes automated exposure control, adjustment of the mA and/or kV according to patient size and/or use of iterative reconstruction technique. CONTRAST:  42m OMNIPAQUE IOHEXOL 350 MG/ML SOLN COMPARISON:  CT abdomen/pelvis 01/09/2022 and prior chest CT 06/07/2020 FINDINGS: CTA CHEST FINDINGS Cardiovascular: The heart is enlarged with four-chamber enlargement. No pericardial effusion. There is moderate tortuosity and calcification of the thoracic aorta but no aneurysm or dissection. The pulmonary arterial  tree is fairly well opacified. The main pulmonary arteries are enlarged which may suggest pulmonary hypertension. No filling defects to suggest pulmonary embolism. Mediastinum/Nodes: Borderline enlarged mediastinal and hilar lymph nodes likely due to the underlying lung findings. The esophagus is grossly normal. Lungs/Pleura: Large bilateral pleural effusions. With mild overlying right lower lobe atelectasis and moderate left lower lobe atelectasis. Hazy interstitial changes and ground-glass opacity likely reflecting asymmetric pulmonary edema. No pulmonary lesions. Musculoskeletal: No significant bony findings. Review of the MIP images confirms the above findings. CT ABDOMEN and PELVIS FINDINGS Hepatobiliary: No  hepatic lesions or intrahepatic biliary dilatation. The gallbladder is surgically absent. No common bile duct dilatation. Pancreas: No mass, inflammation ductal dilatation. Moderate age related pancreatic atrophy. Spleen: Normal size.  No focal lesions. Adrenals/Urinary Tract: The adrenal glands are normal. Mild renal cortical thinning but no renal lesions or hydronephrosis. No collecting system abnormalities on the delayed images. The bladder is. Stomach/Bowel: The stomach, duodenum, small and colon are unremarkable. No acute inflammatory process, mass lesions obstructive findings. The terminal ileum and appendix are normal. Scattered colonic diverticulosis but no findings for acute diverticulitis. Vascular/Lymphatic: Advanced atherosclerotic calcification involving the aorta and branch vessels but no aneurysm dissection. The major venous structures are patent. No mesenteric or retroperitoneal mass or adenopathy. There is a stable rim calcified density in the left upper pelvis adjacent to the descending colon which could be an area of old fat necrosis or calcified lymph node. Reproductive: The uterus and ovaries are unremarkable. Other: Small amount of free pelvic fluid is noted. There is also mild diffuse  body wall edema. Musculoskeletal: No significant bony findings. Review of the MIP images confirms the above findings. IMPRESSION: 1. No CT findings for pulmonary embolism. 2. Cardiac enlargement. 3. Large bilateral pleural effusions with overlying right lower lobe atelectasis and moderate left lower lobe atelectasis. 4. Hazy interstitial changes and ground-glass opacity likely reflecting asymmetric pulmonary edema. 5. No acute abdominal/pelvic findings, mass lesions or adenopathy. 6. Advanced atherosclerotic calcification involving the thoracic and abdominal aorta and branch vessels. 7. Aortic atherosclerosis. Aortic Atherosclerosis (ICD10-I70.0). Electronically Signed   By: Marijo Sanes M.D.   On: 03/10/2022 16:27   CT ABDOMEN PELVIS W CONTRAST  Result Date: 03/10/2022 CLINICAL DATA:  Chest pain and right lower quadrant abdominal pain. EXAM: CT ANGIOGRAPHY CHEST CT ABDOMEN AND PELVIS WITH CONTRAST TECHNIQUE: Multidetector CT imaging of the chest was performed using the standard protocol during bolus administration of intravenous contrast. Multiplanar CT image reconstructions and MIPs were obtained to evaluate the vascular anatomy. Multidetector CT imaging of the abdomen and pelvis was performed using the standard protocol during bolus administration of intravenous contrast. RADIATION DOSE REDUCTION: This exam was performed according to the departmental dose-optimization program which includes automated exposure control, adjustment of the mA and/or kV according to patient size and/or use of iterative reconstruction technique. CONTRAST:  3m OMNIPAQUE IOHEXOL 350 MG/ML SOLN COMPARISON:  CT abdomen/pelvis 01/09/2022 and prior chest CT 06/07/2020 FINDINGS: CTA CHEST FINDINGS Cardiovascular: The heart is enlarged with four-chamber enlargement. No pericardial effusion. There is moderate tortuosity and calcification of the thoracic aorta but no aneurysm or dissection. The pulmonary arterial tree is fairly well  opacified. The main pulmonary arteries are enlarged which may suggest pulmonary hypertension. No filling defects to suggest pulmonary embolism. Mediastinum/Nodes: Borderline enlarged mediastinal and hilar lymph nodes likely due to the underlying lung findings. The esophagus is grossly normal. Lungs/Pleura: Large bilateral pleural effusions. With mild overlying right lower lobe atelectasis and moderate left lower lobe atelectasis. Hazy interstitial changes and ground-glass opacity likely reflecting asymmetric pulmonary edema. No pulmonary lesions. Musculoskeletal: No significant bony findings. Review of the MIP images confirms the above findings. CT ABDOMEN and PELVIS FINDINGS Hepatobiliary: No hepatic lesions or intrahepatic biliary dilatation. The gallbladder is surgically absent. No common bile duct dilatation. Pancreas: No mass, inflammation ductal dilatation. Moderate age related pancreatic atrophy. Spleen: Normal size.  No focal lesions. Adrenals/Urinary Tract: The adrenal glands are normal. Mild renal cortical thinning but no renal lesions or hydronephrosis. No collecting system abnormalities on the delayed images. The bladder  is. Stomach/Bowel: The stomach, duodenum, small and colon are unremarkable. No acute inflammatory process, mass lesions obstructive findings. The terminal ileum and appendix are normal. Scattered colonic diverticulosis but no findings for acute diverticulitis. Vascular/Lymphatic: Advanced atherosclerotic calcification involving the aorta and branch vessels but no aneurysm dissection. The major venous structures are patent. No mesenteric or retroperitoneal mass or adenopathy. There is a stable rim calcified density in the left upper pelvis adjacent to the descending colon which could be an area of old fat necrosis or calcified lymph node. Reproductive: The uterus and ovaries are unremarkable. Other: Small amount of free pelvic fluid is noted. There is also mild diffuse body wall edema.  Musculoskeletal: No significant bony findings. Review of the MIP images confirms the above findings. IMPRESSION: 1. No CT findings for pulmonary embolism. 2. Cardiac enlargement. 3. Large bilateral pleural effusions with overlying right lower lobe atelectasis and moderate left lower lobe atelectasis. 4. Hazy interstitial changes and ground-glass opacity likely reflecting asymmetric pulmonary edema. 5. No acute abdominal/pelvic findings, mass lesions or adenopathy. 6. Advanced atherosclerotic calcification involving the thoracic and abdominal aorta and branch vessels. 7. Aortic atherosclerosis. Aortic Atherosclerosis (ICD10-I70.0). Electronically Signed   By: Marijo Sanes M.D.   On: 03/10/2022 16:27     Scheduled Meds:  arformoterol  15 mcg Nebulization BID   And   umeclidinium bromide  1 puff Inhalation Daily   aspirin  81 mg Oral QPM   atorvastatin  10 mg Oral Daily   budesonide  0.25 mg Nebulization BID   clotrimazole-betamethasone  1 Application Topical BID   enoxaparin (LOVENOX) injection  30 mg Subcutaneous Q24H   erythromycin  1 Application Left Eye QHS   FLUoxetine  10 mg Oral Daily   furosemide  40 mg Intravenous BID   leptospermum manuka honey  1 Application Topical Daily   levothyroxine  62.5 mcg Oral Q0600   ondansetron  4 mg Oral BID WC   pantoprazole  40 mg Oral Daily   risperiDONE  1 mg Oral BID   senna-docusate  1 tablet Oral BID   simethicone  80 mg Oral Q breakfast   sodium chloride flush  3 mL Intravenous Q12H   Continuous Infusions:   LOS: 2 days    Time spent: 21mn    PDomenic Polite MD [facility]   03/12/2022, 12:39 PM

## 2022-03-12 NOTE — Progress Notes (Signed)
Mobility Specialist Progress Note:   03/12/22 0950  Mobility  Activity Transferred from bed to chair  Level of Assistance Moderate assist, patient does 50-74%  Assistive Device Front wheel walker  Distance Ambulated (ft) 3 ft  Activity Response Tolerated well  Mobility Referral Yes  $Mobility charge 1 Mobility   Pt agreeable to mobility session. Required minA for bed mobility, modA to stand and pivot to chair. Pt left with all needs met, chair alarm on. Daughter in room to assist with breakfast.   [place] Specialist Please contact via SecureChat or  Rehab office at [phone]

## 2022-03-12 NOTE — Care Management Important Message (Signed)
Important Message  Patient Details  Name: Donna Arias MRN: [id number] Date of Birth: 01/08/1927   Medicare Important Message Given:  Yes     Harding Thomure [address] 03/12/2022, 2:50 PM

## 2022-03-12 NOTE — Consult Note (Signed)
Consultation Note Date: 03/12/2022   Patient Name: Donna Arias  DOB: [birth year]  MRN: [id number]  Age / Sex: 89 y.o., female  PCP: Velva Harman, PA Referring Physician: Domenic Polite, MD  Reason for Consultation: Establishing goals of care  HPI/Patient Profile: 89 y.o. female  admitted on 03/10/2022 with past medical history of atrial fibrillation, CKD 3B, hypothyroidism, hypertension, CAD, COPD, anxiety, chronic pain, CVA, dementia presented to the ED with shortness of breath, recently had increasing lower extremity edema, also  ongoing abdominal discomfort as well.   -Workup in the ED noted stable vital signs, creatinine 1.2, BNP 997, chest x-ray with CHF, CTA chest noted cardiomegaly, large bilateral pleural effusions, CT abdomen pelvis was unremarkable  Patient does not have medical decision-making capacity at this time.  Family face treatment option decisions, advanced directive decisions and anticipatory care needs.   Clinical Assessment and Goals of Care:  This NP Wadie Lessen reviewed medical records, received report from team, assessed the patient and then meet at the patient's bedside along with her daughter/main decision Charolotte Eke to discuss diagnosis, prognosis, GOC, EOL wishes disposition and options.   Concept of Palliative Care was introduced as specialized medical care for people and their families living with serious illness.  If focuses on providing relief from the symptoms and stress of a serious illness.  The goal is to improve quality of life for both the patient and the family. Values and goals of care important to patient and family were attempted to be elicited.  Created space and opportunity for daughter/Jayanna Kroeger  to explore thoughts and feelings regarding current medical situation.   Patient lives with daughter and her husband, she has been caring for her mother since  2011.    Daughter  does report continued slow physical and functional decline in the patient over the past many months.  Patient has multiple specialists that daughter has been able to continue with follow-ups.  Recent primary care provider change, unsure of the name of the new PCP at this time, they are with [place]  Granddaughter lives nearby and is support also.    Daughter recognizes increasing care needs in the home and is open to viable service options.  Education on hospice benefit; philosophy and eligibility offered.  Patient on process for self-referral to hospice for the future Education offered on home health who could offer in-home physical therapy short-term.  A  discussion was had today regarding advanced directives.  Concepts specific to code status, artifical feeding and hydration, continued IV antibiotics and rehospitalization was had.    The difference between a aggressive medical intervention path  and a palliative comfort care path for this patient at this time was had.      MOST form completed.  A hard choices booklet was left for review     Natural trajectory and expectations at EOL were discussed.    Questions and concerns addressed.  Patient  encouraged to call with questions or concerns.     PMT will continue to support  holistically.     HCPOA/ Tia Alert    SUMMARY OF RECOMMENDATIONS    Code Status/Advance Care Planning: DNR  Palliative Prophylaxis:  Aspiration, Bowel Regimen, Delirium Protocol, Frequent Pain Assessment, and Oral Care  Additional Recommendations (Limitations, Scope, Preferences): Avoid Hospitalization and No Artificial Feeding  Psycho-social/Spiritual:  Desire for further Chaplaincy support:yes-[employer] Additional Recommendations: Education on Hospice  Prognosis:  < 12 months  Discharge Planning: Home with Home Health      Primary Diagnoses: Present on Admission:  Stage 3b chronic kidney disease  (HCC)  Persistent atrial fibrillation (HCC)  Intermediate stage nonexudative age-related macular degeneration of right eye  Hypothyroidism  Hyperlipidemia  GERD  Essential hypertension  Dementia with behavioral disturbance (HCC)  COPD (chronic obstructive pulmonary disease) (HCC)  Anxiety  Chronic bilateral low back pain   I have reviewed the medical record, interviewed the patient and family, and examined the patient. The following aspects are pertinent.  Past Medical History:  Diagnosis Date   Chronic headaches    COPD (chronic obstructive pulmonary disease) (HCC)    Dementia ([place])    Eye infection, left 01/16/2018   GERD (gastroesophageal reflux disease)    Heart attack ([place]) 1998   HLD (hyperlipidemia)    Hypertension    Skin cancer    squamous cell of left face   Squamous cell carcinoma of skin 11/03/2021   L zygoma - poorly differentiated   Stroke [facility])    Thyroid disease    Social History   Socioeconomic History   Marital status: Widowed    Spouse name: Not on file   Number of children: 1   Years of education: Not on file   Highest education level: Not on file  Occupational History   Occupation: RETIRED  Tobacco Use   Smoking status: Former    Types: Cigarettes    Quit date: 1995    Years since quitting: 29.1    Passive exposure: Never   Smokeless tobacco: Never  Vaping Use   Vaping Use: Never used  Substance and Sexual Activity   Alcohol use: No   Drug use: No   Sexual activity: Never    Birth control/protection: None  Other Topics Concern   Not on file  Social History Narrative   CB x 1   Lives at home with her daughter (since 02/05/2013)   Right handed   Regular exercise - NO   Social Determinants of Health   Financial Resource Strain: Not on file  Food Insecurity: Not on file  Transportation Needs: Not on file  Physical Activity: Not on file  Stress: Not on file  Social Connections: Not on file   Family History  Problem Relation Age  of Onset   Hypertension Mother    Stroke Mother    Stroke Sister    Alzheimer's disease Sister    Stroke Sister    Hypertension Sister    Stroke Sister    Breast cancer Sister    Scheduled Meds:  arformoterol  15 mcg Nebulization BID   And   umeclidinium bromide  1 puff Inhalation Daily   aspirin  81 mg Oral QPM   atorvastatin  10 mg Oral Daily   budesonide  0.25 mg Nebulization BID   clotrimazole-betamethasone  1 Application Topical BID   enoxaparin (LOVENOX) injection  30 mg Subcutaneous Q24H   erythromycin  1 Application Left Eye QHS   FLUoxetine  10 mg Oral Daily   furosemide  40 mg Intravenous BID  leptospermum manuka honey  1 Application Topical Daily   levothyroxine  62.5 mcg Oral Q0600   ondansetron  4 mg Oral BID WC   pantoprazole  40 mg Oral Daily   potassium chloride  40 mEq Oral Q4H   risperiDONE  1 mg Oral BID   senna-docusate  1 tablet Oral BID   simethicone  80 mg Oral Q breakfast   sodium chloride flush  3 mL Intravenous Q12H   Continuous Infusions: PRN Meds:.acetaminophen **OR** acetaminophen, albuterol, famotidine, polyethylene glycol, polyvinyl alcohol Medications Prior to Admission:  Prior to Admission medications   Medication Sig Start Date End Date Taking? Authorizing Provider  amLODipine (NORVASC) 2.5 MG tablet Take 1 tablet (2.5 mg total) by mouth daily. Patient taking differently: Take 2.5 mg by mouth every evening. 12/07/21  Yes Lorrene Reid, PA-C  aspirin 81 MG tablet Take 81 mg by mouth every evening.   Yes [provider]  atorvastatin (LIPITOR) 10 MG tablet Take 1 tablet (10 mg total) by mouth daily. 12/07/21  Yes Abonza, Maritza, PA-C  benzonatate (TESSALON) 100 MG capsule Take 1 capsule (100 mg total) by mouth 3 (three) times daily as needed for cough. 03/24/21  Yes Abonza, Maritza, PA-C  budesonide (PULMICORT) 0.25 MG/2ML nebulizer solution Take 2 mLs (0.25 mg total) by nebulization in the morning and at bedtime. 04/20/21  Yes  Martyn Ehrich, NP  clotrimazole-betamethasone (LOTRISONE) cream Apply 1 Application topically 2 (two) times daily. Patient taking differently: Apply 1 Application topically 2 (two) times daily. Apply to right buttock 02/22/22  Yes Boscia, Heather E, NP  dextromethorphan-guaiFENesin (MUCINEX DM) 30-600 MG 12hr tablet Take 1 tablet by mouth 2 (two) times daily as needed for cough.   Yes [provider]  erythromycin ophthalmic ointment Place 1 application into the left eye at bedtime. Patient taking differently: Place 1 application  into the left eye at bedtime as needed (dry eyes). 08/05/20  Yes Abonza, Maritza, PA-C  famotidine (PEPCID) 20 MG tablet Take 20 mg by mouth every evening. Give 1 tablet every evening before supper   Yes [provider]  fexofenadine (ALLEGRA) 180 MG tablet Take 180 mg by mouth at bedtime.   Yes [provider]  FLUoxetine (PROZAC) 10 MG capsule TAKE 1 CAPSULE BY MOUTH DAILY. Patient taking differently: Take 10 mg by mouth daily. 03/08/22  Yes Boscia, Heather E, NP  fluticasone (FLONASE) 50 MCG/ACT nasal spray Place 1 spray into both nostrils 2 (two) times daily. Patient taking differently: Place 1 spray into both nostrils 2 (two) times daily as needed for allergies. 10/12/18  Yes Danford, Valetta Fuller D, NP  furosemide (LASIX) 20 MG tablet Take 1 tablet po QD. May take 2nd tablet PO prn edema Patient taking differently: Take 20 mg by mouth daily. May take 2nd tablet by mouth as needed for fluid and edema 03/08/22  Yes Boscia, Heather E, NP  ipratropium-albuterol (DUONEB) 0.5-2.5 (3) MG/3ML SOLN Take 3 mLs by nebulization every 4 (four) hours as needed. Patient taking differently: Take 3 mLs by nebulization every 6 (six) hours as needed (sob/wheezing). 12/25/21  Yes Lynden Oxford Scales, PA-C  ketoconazole (NIZORAL) 2 % shampoo APPLY TOPICALLY 2 TIMES A WEEK. Patient taking differently: Apply 1 Application topically once a week. 12/07/21  Yes Abonza,  Herb Grays, PA-C  levothyroxine (SYNTHROID) 25 MCG tablet TAKE 1/2 TABLET BY MOUTH DAILY WITH 50 MCG TO EQUAL 62.5 MCG TOTAL DAILY Patient taking differently: Take 12.5 mcg by mouth See admin instructions. TAKE 1/2 TABLET BY  MOUTH DAILY WITH 50 MCG TO EQUAL 62.5 MCG TOTAL DAILY 12/07/21  Yes Abonza, Maritza, PA-C  levothyroxine (SYNTHROID) 50 MCG tablet TAKE 1 TABLET BY MOUTH EVERY DAY Patient taking differently: Take 50 mcg by mouth daily before breakfast. 10/21/21  Yes Abonza, Maritza, PA-C  loratadine (CLARITIN) 10 MG tablet Take 10 mg by mouth daily.   Yes [provider]  mupirocin ointment (BACTROBAN) 2 % APPLY TO AFFECTED AREA ON THE SKIN 2 TIMES A DAY Patient taking differently: Apply 1 Application topically 2 (two) times daily as needed (skin irritation). 08/20/21  Yes Abonza, Maritza, PA-C  nystatin (MYCOSTATIN/NYSTOP) powder Apply 1 Application topically 2 (two) times daily. Patient taking differently: Apply 1 Application topically 2 (two) times daily. Apply to groin area 12/07/21  Yes Abonza, Maritza, PA-C  ondansetron (ZOFRAN) 4 MG tablet Take 1 tablet (4 mg total) by mouth 2 (two) times daily. Patient taking differently: Take 4 mg by mouth 2 (two) times daily. Give 1 tablet in the morning before breakfast and 1 tablet before supper 02/22/22  Yes Zehr, Janett Billow D, PA-C  pantoprazole (PROTONIX) 40 MG tablet Take 1 tablet (40 mg total) by mouth daily. 03/10/22 03/10/23 Yes Parrett, Tammy S, NP  potassium chloride SA (KLOR-CON M) 20 MEQ tablet Take 1 tablet (20 mEq total) by mouth daily. 12/07/21  Yes Abonza, Maritza, PA-C  risperiDONE (RISPERDAL) 1 MG tablet TAKE 1 TABLET BY MOUTH 2 TIMES DAILY. Patient taking differently: Take 1 mg by mouth at bedtime. 11/11/21  Yes Abonza, Maritza, PA-C  Simethicone (GAS-X PO) Take 1 capsule by mouth in the morning. Give 1 capsule in the morning before breakfast   Yes [provider]  Spacer/Aero-Holding Chambers (AEROCHAMBER PLUS WITH MASK)  inhaler Use with Stiolto inhalations. 12/25/21  Yes Lynden Oxford Scales, PA-C  STIOLTO RESPIMAT 2.5-2.5 MCG/ACT AERS INHALE 2 PUFFS INTO THE LUNGS DAILY. Patient taking differently: Inhale 2 puffs into the lungs at bedtime. 03/01/22  Yes Boscia, Heather E, NP  albuterol (PROVENTIL) (2.5 MG/3ML) 0.083% nebulizer solution Take 3 mLs (2.5 mg total) by nebulization every 6 (six) hours as needed for wheezing or shortness of breath. Patient not taking: Reported on 03/10/2022 11/12/19   Lorrene Reid, PA-C  Calcium Carbonate-Vit D-Min (CALCIUM 1200 PO) Take 1 tablet by mouth daily. Patient not taking: Reported on 03/10/2022    [provider]  Cranberry 500 MG TABS Take 2 tablets by mouth 3 (three) times a week. Patient not taking: Reported on 03/10/2022    [provider]  Polyvinyl Alcohol-Povidone PF (REFRESH) 1.4-0.6 % SOLN Apply 1 drop to eye 2 (two) times daily as needed (dry eyes). Patient not taking: Reported on 03/10/2022    [provider]  triamcinolone cream (KENALOG) 0.1 % Apply 1 application topically 2 (two) times daily. Request jar Patient not taking: Reported on 03/10/2022 10/12/18   Mina Marble D, NP   Allergies  Allergen Reactions   Picato [Ingenol Mebutate] Hives   Carrot [Daucus Carota] Other (See Comments)    Unable to eat a lot of it due to eye condition per MD   Codeine Sulfate Other (See Comments)    "Made her drunk" per daughter at the bedside   Review of Systems  Physical Exam  Vital Signs: BP 114/75 (BP Location: Right Arm)   Pulse 86   Temp (!) 96.4 F (35.8 C) (Axillary)   Resp 20   Wt 60.6 kg   SpO2 96%   BMI 23.67 kg/m  Pain Scale: 0-10  Pain Score: 0-No pain   SpO2: SpO2: 96 % O2 Device:SpO2: 96 % O2 Flow Rate: .   IO: Intake/output summary:  Intake/Output Summary (Last 24 hours) at 03/12/2022 1250 Last data filed at 03/12/2022 [id number] Gross per 24 hour  Intake 440 ml  Output 700 ml  Net -260 ml    LBM: Last BM Date :  03/09/22 Baseline Weight: Weight: 60.6 kg Most recent weight: Weight: 60.6 kg     Palliative Assessment/Data: 40 % at best   Discussed with Broadus John  Time In: 1200 Time Out: 1330 Time Total: 90 minutes Greater than 50%  of this time was spent counseling and coordinating care related to the above assessment and plan.  Signed by: Wadie Lessen, NP   Please contact Palliative Medicine Team phone at [phone] for questions and concerns.  For individual provider: See Shea Evans

## 2022-03-12 NOTE — Progress Notes (Signed)
Mobility Specialist Progress Note:   03/12/22 1200  Mobility  Activity Stood at bedside (x3)  Level of Assistance Moderate assist, patient does 50-74%  Assistive Device Front wheel walker  Activity Response Tolerated well  Mobility Referral Yes  $Mobility charge 1 Mobility   NT requesting assistance, pt with large liquid BM. Required modA to stand x3 to perform pericare. Pt tolerated well. Left in chair with all needs met.   Nelta Numbers Mobility Specialist Please contact via SecureChat or  Rehab office at [phone]

## 2022-03-13 DIAGNOSIS — I509 Heart failure, unspecified: Secondary | ICD-10-CM | POA: Diagnosis not present

## 2022-03-13 LAB — BASIC METABOLIC PANEL WITH GFR
Anion gap: 12 (ref 5–15)
BUN: 16 mg/dL (ref 8–23)
CO2: 25 mmol/L (ref 22–32)
Calcium: 8.7 mg/dL — ABNORMAL LOW (ref 8.9–10.3)
Chloride: 95 mmol/L — ABNORMAL LOW (ref 98–111)
Creatinine, Ser: 1.34 mg/dL — ABNORMAL HIGH (ref 0.44–1.00)
GFR, Estimated: 37 mL/min — ABNORMAL LOW
Glucose, Bld: 92 mg/dL (ref 70–99)
Potassium: 4.3 mmol/L (ref 3.5–5.1)
Sodium: 132 mmol/L — ABNORMAL LOW (ref 135–145)

## 2022-03-13 MED ORDER — GUAIFENESIN ER 600 MG PO TB12
600.0000 mg | ORAL_TABLET | Freq: Two times a day (BID) | ORAL | Status: DC
  Administered 2022-03-13 – 2022-03-15 (×4): 600 mg via ORAL
  Filled 2022-03-13 (×4): qty 1

## 2022-03-13 MED ORDER — SPIRONOLACTONE 25 MG PO TABS
25.0000 mg | ORAL_TABLET | Freq: Every day | ORAL | Status: DC
  Administered 2022-03-13 – 2022-03-15 (×3): 25 mg via ORAL
  Filled 2022-03-13 (×3): qty 1

## 2022-03-13 NOTE — Progress Notes (Signed)
PROGRESS NOTE    Donna Arias  [id number] DOB: [birth year] DOA: 03/10/2022 PCP: Velva Harman, PA  89/F with history of atrial fibrillation, CKD 3B, hypothyroidism, hypertension, CAD, COPD, anxiety, chronic pain, CVA, dementia presented to the ED with shortness of breath, recently had increasing lower extremity edema, Lasix dose increased also had some ongoing abdominal discomfort as well. -Workup in the ED noted stable vital signs, creatinine 1.2, BNP 997, chest x-ray with CHF, CTA chest noted cardiomegaly, large bilateral pleural effusions, CT abdomen pelvis was unremarkable   Subjective: Feels better, breathing starting to improve  Assessment and Plan:  Acute systolic CHF, severe BiV failure Bilateral pulmonary effusions -2D echo with a EF less than 20%, RV function severely decreased, moderately elevated PA systolic pressure -Urine output has been inaccurate, weight down 9 pounds, continue IV Lasix 1 more day, add Aldactone -Poor prognosis in the setting of advanced age, dementia and severe BiV failure, discussed with daughter, seen by palliative care, now DNR, \ -Conservative management considering advanced age, memory loss and poor functional status -She would be more appropriate for hospice at home   CKD 3B Hyponatremia -Mild AKI, monitor with diuresis, stable   Atrial fibrillation > Known history of A-fib not currently on rate control or anticoagulation.  On account of age and falls   Hypothyroidism - Continue home Synthroid   Hyperlipidemia - Continue home atorvastatin   Intermittent abdominal pain Decreased p.o. intake -Could be from BiV failure, supportive care   GERD - Continue home PPI   Hypertension - Lasix as above - Will hold amlodipine while initiating IV Lasix   CAD > Chart review 7 history of this with MI in 1998. - Continue home aspirin, atorvastatin   COPD - Replace home Stiolto with formulary Brovana and Incruse - Continue home  Pulmicort - Continue as needed albuterol   History of stroke - Continue home aspirin and atorvastatin   Dementia with behavior disturbance - Continue home fluoxetine and Risperdal   Conjunctivitis - Continue erythromycin ointment   DVT prophylaxis:      Lovenox Code Status:              DNR Family Communication: No family at bedside, called and updated daughter yesterday Disposition Plan:   Consultants:    Procedures:   Antimicrobials:    Objective: Vitals:   03/13/22 0452 03/13/22 0812 03/13/22 0825 03/13/22 1123  BP: 121/82 125/70  138/88  Pulse: 82 72  78  Resp: 20 (!) 22  (!) 24  Temp: 97.9 F (36.6 C) (!) 97.4 F (36.3 C)  (!) 97.3 F (36.3 C)  TempSrc: Axillary Axillary  Oral  SpO2: 93% 95% 95% 93%  Weight: 56.6 kg       Intake/Output Summary (Last 24 hours) at 03/13/2022 1240 Last data filed at 03/13/2022 1143 Gross per 24 hour  Intake 840 ml  Output 1050 ml  Net -210 ml   Filed Weights   03/12/22 0524 03/13/22 0452  Weight: 60.6 kg 56.6 kg    Examination:  General exam: Early chronically ill female laying in bed, somnolent but easily arousable, oriented to self and place, cognitive deficits  CVS: S1-S2, irregular rhythm Lungs: Decreased breath sounds to bases Abdomen: Soft, nontender, bowel sounds present Extremities: No edema  Skin: No rashes Psychiatry: Flat affect    Data Reviewed:   CBC: Recent Labs  Lab 03/10/22 1140 03/11/22 0303 03/12/22 0257  WBC 7.5 5.6 6.3  NEUTROABS 6.2  --   --  HGB 11.6* 11.7* 11.8*  HCT 35.8* 34.1* 33.9*  MCV 91.8 87.4 87.6  PLT 168 172 [id number]   Basic Metabolic Panel: Recent Labs  Lab 03/08/22 1057 03/10/22 1140 03/11/22 0303 03/12/22 0257 03/13/22 0232  NA 135 129* 130* 131* 132*  K 4.5 3.8 3.8 2.8* 4.3  CL 97 94* 95* 93* 95*  CO2 21 23 23 28 25  $ GLUCOSE 118* 102* 98 96 92  BUN 10 9 11 10 16  $ CREATININE 1.09* 1.24* 1.52* 1.35* 1.34*  CALCIUM 9.7 8.8* 8.9 8.6* 8.7*  MG  --   --  1.8  --    --    GFR: Estimated Creatinine Clearance: 21.7 mL/min (A) (by C-G formula based on SCr of 1.34 mg/dL (H)). Liver Function Tests: Recent Labs  Lab 03/10/22 1140 03/11/22 0303  AST 25 19  ALT 14 12  ALKPHOS 72 69  BILITOT 1.2 1.1  PROT 5.7* 5.9*  ALBUMIN 2.9* 3.2*   Recent Labs  Lab 03/10/22 1140  LIPASE 27   No results for input(s): "AMMONIA" in the last 168 hours. Coagulation Profile: No results for input(s): "INR", "PROTIME" in the last 168 hours. Cardiac Enzymes: No results for input(s): "CKTOTAL", "CKMB", "CKMBINDEX", "TROPONINI" in the last 168 hours. BNP (last 3 results) No results for input(s): "PROBNP" in the last 8760 hours. HbA1C: No results for input(s): "HGBA1C" in the last 72 hours. CBG: No results for input(s): "GLUCAP" in the last 168 hours. Lipid Profile: No results for input(s): "CHOL", "HDL", "LDLCALC", "TRIG", "CHOLHDL", "LDLDIRECT" in the last 72 hours. Thyroid Function Tests: No results for input(s): "TSH", "T4TOTAL", "FREET4", "T3FREE", "THYROIDAB" in the last 72 hours. Anemia Panel: No results for input(s): "VITAMINB12", "FOLATE", "FERRITIN", "TIBC", "IRON", "RETICCTPCT" in the last 72 hours. Urine analysis:    Component Value Date/Time   COLORURINE STRAW (A) 03/10/2022 1655   APPEARANCEUR CLEAR 03/10/2022 1655   LABSPEC 1.010 03/10/2022 1655   PHURINE 5.0 03/10/2022 1655   GLUCOSEU NEGATIVE 03/10/2022 1655   HGBUR SMALL (A) 03/10/2022 1655   HGBUR negative 02/26/2008 0950   BILIRUBINUR NEGATIVE 03/10/2022 1655   BILIRUBINUR negative 12/25/2021 1104   BILIRUBINUR Negative 12/01/2020 1403   KETONESUR NEGATIVE 03/10/2022 1655   PROTEINUR NEGATIVE 03/10/2022 1655   UROBILINOGEN 0.2 12/25/2021 1104   UROBILINOGEN 0.2 02/26/2008 0950   NITRITE NEGATIVE 03/10/2022 1655   LEUKOCYTESUR NEGATIVE 03/10/2022 1655   Sepsis Labs: @LABRCNTIP$ (procalcitonin:4,lacticidven:4)  )No results found for this or any previous visit (from the past 240  hour(s)).   Radiology Studies: ECHOCARDIOGRAM COMPLETE  Result Date: 03/11/2022    ECHOCARDIOGRAM REPORT   Patient Name:   Donna Arias Date of Exam: 03/11/2022 Medical Rec #:  [id number]      Height:       63.0 in Accession #:    [id number]     Weight:       131.0 lb Date of Birth:  1926/04/09      BSA:          1.615 m Patient Age:    89 years       BP:           125/82 mmHg Patient Gender: F              HR:           93 bpm. Exam Location:  Inpatient Procedure: 2D Echo, Cardiac Doppler, Color Doppler and Intracardiac            Opacification Agent Indications:  CHF  History:        Patient has no prior history of Echocardiogram examinations.                 Previous Myocardial Infarction, COPD and Stroke; Risk                 Factors:GERD, Dyslipidemia and Hypertension.  Sonographer:    Bernadene Person RDCS Referring Phys: [id number] [place]  1. Left ventricular ejection fraction, by estimation, is <20%. The left ventricle has severely decreased function. The left ventricle demonstrates global hypokinesis. Left ventricular diastolic parameters are indeterminate.  2. Right ventricular systolic function is severely reduced. The right ventricular size is mildly enlarged. There is moderately elevated pulmonary artery systolic pressure.  3. Large pleural effusion in the left lateral region.  4. The mitral valve is normal in structure. Mild to moderate mitral valve regurgitation.  5. The aortic valve is tricuspid. Aortic valve regurgitation is mild.  6. The inferior vena cava is dilated in size with <50% respiratory variability, suggesting right atrial pressure of 15 mmHg. FINDINGS  Left Ventricle: Left ventricular ejection fraction, by estimation, is <20%. The left ventricle has severely decreased function. The left ventricle demonstrates global hypokinesis. Definity contrast agent was given IV to delineate the left ventricular endocardial borders. The left ventricular internal cavity size  was normal in size. There is no left ventricular hypertrophy. Left ventricular diastolic parameters are indeterminate. Right Ventricle: The right ventricular size is mildly enlarged. Right vetricular wall thickness was not assessed. Right ventricular systolic function is severely reduced. There is moderately elevated pulmonary artery systolic pressure. The tricuspid regurgitant velocity is 3.15 m/s, and with an assumed right atrial pressure of 15 mmHg, the estimated right ventricular systolic pressure is [id number] mmHg. Left Atrium: Left atrial size was normal in size. Right Atrium: Right atrial size was normal in size. Pericardium: Trivial pericardial effusion is present. Mitral Valve: The mitral valve is normal in structure. Mild to moderate mitral valve regurgitation. Tricuspid Valve: The tricuspid valve is normal in structure. Tricuspid valve regurgitation is mild. Aortic Valve: The aortic valve is tricuspid. Aortic valve regurgitation is mild. Aortic regurgitation PHT measures 597 msec. Pulmonic Valve: The pulmonic valve was normal in structure. Pulmonic valve regurgitation is mild. Aorta: The aortic root and ascending aorta are structurally normal, with no evidence of dilitation. Venous: The inferior vena cava is dilated in size with less than 50% respiratory variability, suggesting right atrial pressure of 15 mmHg. IAS/Shunts: No atrial level shunt detected by color flow Doppler. Additional Comments: There is a large pleural effusion in the left lateral region.  LEFT VENTRICLE PLAX 2D LVIDd:         5.30 cm LVIDs:         4.30 cm LV PW:         0.70 cm LV IVS:        0.60 cm LVOT diam:     1.80 cm LV SV:         31 LV SV Index:   19 LVOT Area:     2.54 cm  LV Volumes (MOD) LV vol d, MOD A2C: 113.0 ml LV vol d, MOD A4C: 81.9 ml LV vol s, MOD A2C: 81.9 ml LV vol s, MOD A4C: 69.0 ml LV SV MOD A2C:     31.1 ml LV SV MOD A4C:     81.9 ml LV SV MOD BP:      22.1 ml RIGHT VENTRICLE RV S  prime:     7.56 cm/s TAPSE  (M-mode): 1.0 cm LEFT ATRIUM             Index        RIGHT ATRIUM           Index LA diam:        3.80 cm 2.35 cm/m   RA Area:     16.50 cm LA Vol (A2C):   52.9 ml 32.75 ml/m  RA Volume:   46.20 ml  28.60 ml/m LA Vol (A4C):   49.4 ml 30.58 ml/m LA Biplane Vol: 53.0 ml 32.81 ml/m  AORTIC VALVE                   PULMONIC VALVE LVOT Vmax:         93.43 cm/s  PR End Diast Vel: 10.63 msec LVOT Vmean:        55.567 cm/s LVOT VTI:          0.121 m AI PHT:            597 msec AR Vena Contracta: 0.40 cm  AORTA Ao Root diam: 2.60 cm Ao Asc diam:  3.00 cm MR PISA:        0.57 cm  TRICUSPID VALVE MR PISA Radius: 0.30 cm   TR Peak grad:   39.7 mmHg                           TR Vmax:        315.00 cm/s                            SHUNTS                           Systemic VTI:  0.12 m                           Systemic Diam: 1.80 cm Dorris Carnes MD Electronically signed by Dorris Carnes MD Signature Date/Time: 03/11/2022/5:43:20 PM    Final      Scheduled Meds:  arformoterol  15 mcg Nebulization BID   And   umeclidinium bromide  1 puff Inhalation Daily   aspirin  81 mg Oral QPM   atorvastatin  10 mg Oral Daily   budesonide  0.25 mg Nebulization BID   clotrimazole-betamethasone  1 Application Topical BID   enoxaparin (LOVENOX) injection  30 mg Subcutaneous Q24H   erythromycin  1 Application Left Eye QHS   FLUoxetine  10 mg Oral Daily   furosemide  40 mg Intravenous BID   leptospermum manuka honey  1 Application Topical Daily   levothyroxine  62.5 mcg Oral Q0600   ondansetron  4 mg Oral BID WC   pantoprazole  40 mg Oral Daily   risperiDONE  1 mg Oral BID   senna-docusate  1 tablet Oral BID   simethicone  80 mg Oral Q breakfast   sodium chloride flush  3 mL Intravenous Q12H   Continuous Infusions:   LOS: 3 days    Time spent: 5mn    PDomenic Polite MD [facility]   03/13/2022, 12:40 PM

## 2022-03-13 NOTE — Evaluation (Signed)
Occupational Therapy Evaluation Patient Details Name: Donna Arias MRN: [id number] DOB: 06/11/1926 Today's Date: 03/13/2022   History of Present Illness Pt is a 89 y.o. female who presented 03/10/22 with SOB. Pt admitted with acute CHF and bil pulmonary effusions. PMH: CKD 3B, afib, hypothyroidism, HLD, GERD, HTN, CAD s/p MI in 1998, COPD, anxiety, chronic pain, CVA, macular degeneration, dementia   Clinical Impression   Pt admitted for concerns listed above. PTA pt's daughter reported that she was requiring min to mod assist with all ADL's and total assist for all IADL's. At this time, she is requiring increased assist and time for all functional mobility, as well as mod-max A for all ADL's. Pt fatigues quickly and at times has difficulty following directions. Recommending [place], as pt most likely will do better in a familiar environment with familiar people. OT will continue to follow acutely.       Recommendations for follow up therapy are one component of a multi-disciplinary discharge planning process, led by the attending physician.  Recommendations may be updated based on patient status, additional functional criteria and insurance authorization.   Follow Up Recommendations  Home health OT     Assistance Recommended at Discharge Frequent or constant Supervision/Assistance  Patient can return home with the following A lot of help with walking and/or transfers;A little help with bathing/dressing/bathroom;Assistance with cooking/housework;Help with stairs or ramp for entrance    Functional Status Assessment  Patient has had a recent decline in their functional status and demonstrates the ability to make significant improvements in function in a reasonable and predictable amount of time.  Equipment Recommendations  None recommended by OT    Recommendations for Other Services       Precautions / Restrictions Precautions Precautions: Fall;Other (comment) Precaution Comments: watch  HR Restrictions Weight Bearing Restrictions: No      Mobility Bed Mobility Overal bed mobility: Needs Assistance Bed Mobility: Supine to Sit, Sit to Supine     Supine to sit: Mod assist, HOB elevated Sit to supine: Min assist   General bed mobility comments: Mod A to pull to sitting. Min A to swing hips around to supine    Transfers Overall transfer level: Needs assistance Equipment used: Rolling walker (2 wheels), 1 person hand held assist Transfers: Sit to/from Stand, Bed to chair/wheelchair/BSC Sit to Stand: Mod assist     Step pivot transfers: Mod assist     General transfer comment: Working on taking steps forwards and backwards, unable to take side steps, impulsively sitting without warning multiple times      Balance Overall balance assessment: Needs assistance Sitting-balance support: No upper extremity supported, Feet supported Sitting balance-Leahy Scale: Fair Sitting balance - Comments: Static sitting EOB with min guard for safety Postural control: Posterior lean Standing balance support: Bilateral upper extremity supported, Reliant on assistive device for balance, During functional activity Standing balance-Leahy Scale: Poor Standing balance comment: Reliant on UE support and external physical assistance.                           ADL either performed or assessed with clinical judgement   ADL Overall ADL's : Needs assistance/impaired Eating/Feeding: Moderate assistance;Sitting   Grooming: Minimal assistance;Sitting   Upper Body Bathing: Moderate assistance;Sitting   Lower Body Bathing: Maximal assistance;Sitting/lateral leans;Sit to/from stand   Upper Body Dressing : Minimal assistance;Sitting   Lower Body Dressing: Maximal assistance;Sitting/lateral leans;Sit to/from stand   Toilet Transfer: Moderate assistance;Stand-pivot   Toileting- Clothing  Manipulation and Hygiene: Maximal assistance;Sitting/lateral lean;Sit to/from stand        Functional mobility during ADLs: Moderate assistance;Rolling walker (2 wheels) General ADL Comments: Requiring mildly increased assist compared to her baseline     Vision Baseline Vision/History: 1 Wears glasses Ability to See in Adequate Light: 1 Impaired Patient Visual Report: No change from baseline Vision Assessment?: No apparent visual deficits     Perception Perception Perception Tested?: No   Praxis Praxis Praxis tested?: Not tested    Pertinent Vitals/Pain Pain Assessment Pain Assessment: No/denies pain     Hand Dominance Right   Extremity/Trunk Assessment Upper Extremity Assessment Upper Extremity Assessment: Generalized weakness   Lower Extremity Assessment Lower Extremity Assessment: Defer to PT evaluation   Cervical / Trunk Assessment Cervical / Trunk Assessment: Kyphotic   Communication Communication Communication: HOH   Cognition Arousal/Alertness: Awake/alert Behavior During Therapy: WFL for tasks assessed/performed Overall Cognitive Status: History of cognitive impairments - at baseline                                 General Comments: Pt can be mildly impulsive to try to sit prematurely. Hx of dementia but typically knows the date, location, and situation when at home. Pt appears more confused than baseline currently, likely due to being out of her familiar environment.     General Comments  VSS on RA    Exercises     Shoulder Instructions      Home Living Family/patient expects to be discharged to:: Private residence Living Arrangements: Children Available Help at Discharge: Family;Available 24 hours/day Type of Home: House Home Access: Ramped entrance     Home Layout: One level     Bathroom Shower/Tub: Sponge bathes at baseline   Bathroom Toilet: Handicapped height Bathroom Accessibility: Yes How Accessible: Accessible via walker Home Equipment: [place] (2 wheels);BSC/3in1;Shower seat;Grab bars - toilet;Grab  bars - tub/shower;Wheelchair - manual;Hospital bed          Prior Functioning/Environment Prior Level of Function : Needs assist             Mobility Comments: Daughter assists pt as needed with bed mobility and pt never gets up and walks without assistance from her daughter. Daughter tends to hug onto pt to provide stability with walking as pt has difficulty utilizing an AD. ADLs Comments: Daughter manages meds, finances, and assists with ADLs        OT Problem List: Decreased strength;Decreased activity tolerance;Impaired balance (sitting and/or standing);Decreased safety awareness;Decreased knowledge of use of DME or AE;Cardiopulmonary status limiting activity;Impaired UE functional use      OT Treatment/Interventions: Self-care/ADL training;Therapeutic exercise;Energy conservation;DME and/or AE instruction;Therapeutic activities;Patient/family education;Balance training    OT Goals(Current goals can be found in the care plan section) Acute Rehab OT Goals Patient Stated Goal: To go home OT Goal Formulation: With patient/family Time For Goal Achievement: 03/27/22 Potential to Achieve Goals: Good ADL Goals Pt Will Perform Grooming: with modified independence;sitting;with set-up Pt Will Perform Lower Body Bathing: with mod assist;sitting/lateral leans;sit to/from stand Pt Will Perform Lower Body Dressing: with mod assist;sit to/from stand;sitting/lateral leans Pt Will Transfer to Toilet: with min assist;ambulating Pt Will Perform Toileting - Clothing Manipulation and hygiene: with min assist;sitting/lateral leans;sit to/from stand  OT Frequency: Min 2X/week    Co-evaluation              AM-PAC OT "6 Clicks" Daily Activity     Outcome Measure  Help from another person eating meals?: A Lot Help from another person taking care of personal grooming?: A Little Help from another person toileting, which includes using toliet, bedpan, or urinal?: A Lot Help from another person  bathing (including washing, rinsing, drying)?: A Lot Help from another person to put on and taking off regular upper body clothing?: A Lot Help from another person to put on and taking off regular lower body clothing?: A Lot 6 Click Score: 13   End of Session Equipment Utilized During Treatment: Rolling walker (2 wheels);Gait belt Nurse Communication: Mobility status  Activity Tolerance: Patient tolerated treatment well Patient left: in bed;with call bell/phone within reach;with bed alarm set;with family/visitor present  OT Visit Diagnosis: Unsteadiness on feet (R26.81);Other abnormalities of gait and mobility (R26.89);Muscle weakness (generalized) (M62.81)                Time: [id number] OT Time Calculation (min): 33 min Charges:  OT General Charges $OT Visit: 1 Visit OT Evaluation $OT Eval Moderate Complexity: 1 Mod OT Treatments $Self Care/Home Management : 8-22 mins  Paulita Fujita, OTR/L [facility]  Braelen Sproule Elane Yolanda Bonine 03/13/2022, 1:00 PM

## 2022-03-14 DIAGNOSIS — I509 Heart failure, unspecified: Secondary | ICD-10-CM | POA: Diagnosis not present

## 2022-03-14 LAB — BASIC METABOLIC PANEL
Anion gap: 9 (ref 5–15)
BUN: 20 mg/dL (ref 8–23)
CO2: 29 mmol/L (ref 22–32)
Calcium: 8.6 mg/dL — ABNORMAL LOW (ref 8.9–10.3)
Chloride: 94 mmol/L — ABNORMAL LOW (ref 98–111)
Creatinine, Ser: 1.31 mg/dL — ABNORMAL HIGH (ref 0.44–1.00)
GFR, Estimated: 38 mL/min — ABNORMAL LOW (ref >60)
Glucose, Bld: 87 mg/dL (ref 70–99)
Potassium: 3.3 mmol/L — ABNORMAL LOW (ref 3.5–5.1)
Sodium: 132 mmol/L — ABNORMAL LOW (ref 135–145)

## 2022-03-14 MED ORDER — POTASSIUM CHLORIDE 20 MEQ PO PACK
40.0000 meq | PACK | Freq: Once | ORAL | Status: AC
  Administered 2022-03-14: 40 meq via ORAL
  Filled 2022-03-14: qty 2

## 2022-03-14 MED ORDER — FUROSEMIDE 10 MG/ML IJ SOLN
40.0000 mg | Freq: Once | INTRAMUSCULAR | Status: AC
  Administered 2022-03-14: 40 mg via INTRAVENOUS
  Filled 2022-03-14: qty 4

## 2022-03-14 MED ORDER — POTASSIUM CHLORIDE CRYS ER 20 MEQ PO TBCR
40.0000 meq | EXTENDED_RELEASE_TABLET | Freq: Two times a day (BID) | ORAL | Status: DC
  Administered 2022-03-14: 40 meq via ORAL
  Filled 2022-03-14: qty 2

## 2022-03-14 NOTE — Progress Notes (Signed)
PROGRESS NOTE    Donna Arias  [id number] DOB: [birth year] DOA: 03/10/2022 PCP: Velva Harman, PA  89/F with history of atrial fibrillation, CKD 3B, hypothyroidism, hypertension, CAD, COPD, anxiety, chronic pain, CVA, dementia presented to the ED with shortness of breath, recently had increasing lower extremity edema, Lasix dose increased also had some ongoing abdominal discomfort as well. -Workup in the ED noted stable vital signs, creatinine 1.2, BNP 997, chest x-ray with CHF, CTA chest noted cardiomegaly, large bilateral pleural effusions, CT abdomen pelvis was unremarkable   Subjective: Feels better, breathing starting to improve  Assessment and Plan:  Acute systolic CHF, severe BiV failure Bilateral pulmonary effusions -2D echo with a EF less than 20%, RV function severely decreased, moderately elevated PA systolic pressure -Urine output has been inaccurate, weight down 9 pounds, continue IV Lasix 1 more day, add Aldactone -Poor prognosis in the setting of advanced age, dementia and severe BiV failure, discussed with daughter, seen by palliative care, now DNR, \ -Conservative management considering advanced age, memory loss and poor functional status -Called and updated daughter again, would be most appropriate for home hospice, discharge planning, home tomorrow if stable   CKD 3B Hyponatremia -Mild AKI, monitor with diuresis, stable   Atrial fibrillation > Known history of A-fib not currently on rate control or anticoagulation.  On account of age and falls   Hypothyroidism - Continue home Synthroid   Hyperlipidemia - Continue home atorvastatin   Intermittent abdominal pain Decreased p.o. intake -Could be from BiV failure, supportive care   GERD - Continue home PPI   Hypertension - Lasix as above - Will hold amlodipine while initiating IV Lasix   CAD > Chart review 7 history of this with MI in 1998. - Continue home aspirin, atorvastatin   COPD -  Replace home Stiolto with formulary Brovana and Incruse - Continue home Pulmicort - Continue as needed albuterol   History of stroke - Continue home aspirin and atorvastatin   Dementia with behavior disturbance - Continue home fluoxetine and Risperdal   Conjunctivitis - Continue erythromycin ointment   DVT prophylaxis:      Lovenox Code Status:              DNR Family Communication: No family at bedside, called and updated daughter  Disposition Plan:   Consultants:    Procedures:   Antimicrobials:    Objective: Vitals:   03/13/22 2012 03/14/22 0500 03/14/22 0821 03/14/22 1151  BP: 114/83 (!) 120/55  (!) 115/55  Pulse: 72 78  76  Resp: 18 17  17  $ Temp: (!) 97.2 F (36.2 C) (!) 97.3 F (36.3 C)  (!) 97.5 F (36.4 C)  TempSrc: Oral Oral  Oral  SpO2: 97% 94% 97% 94%  Weight:  54.3 kg      Intake/Output Summary (Last 24 hours) at 03/14/2022 1309 Last data filed at 03/14/2022 0500 Gross per 24 hour  Intake 100 ml  Output 1450 ml  Net -1350 ml   Filed Weights   03/12/22 0524 03/13/22 0452 03/14/22 0500  Weight: 60.6 kg 56.6 kg 54.3 kg    Examination:  General exam: Elderly frail chronically ill female laying in bed, awake alert oriented to self and place, cognitive deficits CVS: S1-S2, irregular rhythm Lungs: Decreased breath sounds to bases Abdomen: Soft, nontender, bowel sounds present Remedies: No edema Skin: No rashes Psychiatry: Flat affect    Data Reviewed:   CBC: Recent Labs  Lab 03/10/22 1140 03/11/22 0303 03/12/22 0257  WBC 7.5  5.6 6.3  NEUTROABS 6.2  --   --   HGB 11.6* 11.7* 11.8*  HCT 35.8* 34.1* 33.9*  MCV 91.8 87.4 87.6  PLT 168 172 [id number]   Basic Metabolic Panel: Recent Labs  Lab 03/10/22 1140 03/11/22 0303 03/12/22 0257 03/13/22 0232 03/14/22 0225  NA 129* 130* 131* 132* 132*  K 3.8 3.8 2.8* 4.3 3.3*  CL 94* 95* 93* 95* 94*  CO2 23 23 28 25 29  $ GLUCOSE 102* 98 96 92 87  BUN 9 11 10 16 20  $ CREATININE 1.24* 1.52* 1.35*  1.34* 1.31*  CALCIUM 8.8* 8.9 8.6* 8.7* 8.6*  MG  --  1.8  --   --   --    GFR: Estimated Creatinine Clearance: 22.2 mL/min (A) (by C-G formula based on SCr of 1.31 mg/dL (H)). Liver Function Tests: Recent Labs  Lab 03/10/22 1140 03/11/22 0303  AST 25 19  ALT 14 12  ALKPHOS 72 69  BILITOT 1.2 1.1  PROT 5.7* 5.9*  ALBUMIN 2.9* 3.2*   Recent Labs  Lab 03/10/22 1140  LIPASE 27   No results for input(s): "AMMONIA" in the last 168 hours. Coagulation Profile: No results for input(s): "INR", "PROTIME" in the last 168 hours. Cardiac Enzymes: No results for input(s): "CKTOTAL", "CKMB", "CKMBINDEX", "TROPONINI" in the last 168 hours. BNP (last 3 results) No results for input(s): "PROBNP" in the last 8760 hours. HbA1C: No results for input(s): "HGBA1C" in the last 72 hours. CBG: No results for input(s): "GLUCAP" in the last 168 hours. Lipid Profile: No results for input(s): "CHOL", "HDL", "LDLCALC", "TRIG", "CHOLHDL", "LDLDIRECT" in the last 72 hours. Thyroid Function Tests: No results for input(s): "TSH", "T4TOTAL", "FREET4", "T3FREE", "THYROIDAB" in the last 72 hours. Anemia Panel: No results for input(s): "VITAMINB12", "FOLATE", "FERRITIN", "TIBC", "IRON", "RETICCTPCT" in the last 72 hours. Urine analysis:    Component Value Date/Time   COLORURINE STRAW (A) 03/10/2022 1655   APPEARANCEUR CLEAR 03/10/2022 1655   LABSPEC 1.010 03/10/2022 1655   PHURINE 5.0 03/10/2022 1655   GLUCOSEU NEGATIVE 03/10/2022 1655   HGBUR SMALL (A) 03/10/2022 1655   HGBUR negative 02/26/2008 0950   BILIRUBINUR NEGATIVE 03/10/2022 1655   BILIRUBINUR negative 12/25/2021 1104   BILIRUBINUR Negative 12/01/2020 1403   KETONESUR NEGATIVE 03/10/2022 1655   PROTEINUR NEGATIVE 03/10/2022 1655   UROBILINOGEN 0.2 12/25/2021 1104   UROBILINOGEN 0.2 02/26/2008 0950   NITRITE NEGATIVE 03/10/2022 1655   LEUKOCYTESUR NEGATIVE 03/10/2022 1655   Sepsis Labs: @LABRCNTIP$ (procalcitonin:4,lacticidven:4)  )No  results found for this or any previous visit (from the past 240 hour(s)).   Radiology Studies: No results found.   Scheduled Meds:  arformoterol  15 mcg Nebulization BID   And   umeclidinium bromide  1 puff Inhalation Daily   aspirin  81 mg Oral QPM   atorvastatin  10 mg Oral Daily   budesonide  0.25 mg Nebulization BID   clotrimazole-betamethasone  1 Application Topical BID   enoxaparin (LOVENOX) injection  30 mg Subcutaneous Q24H   erythromycin  1 Application Left Eye QHS   FLUoxetine  10 mg Oral Daily   guaiFENesin  600 mg Oral BID   leptospermum manuka honey  1 Application Topical Daily   levothyroxine  62.5 mcg Oral Q0600   ondansetron  4 mg Oral BID WC   pantoprazole  40 mg Oral Daily   potassium chloride  40 mEq Oral BID   risperiDONE  1 mg Oral BID   senna-docusate  1 tablet Oral BID  simethicone  80 mg Oral Q breakfast   sodium chloride flush  3 mL Intravenous Q12H   spironolactone  25 mg Oral Daily   Continuous Infusions:   LOS: 4 days    Time spent: 86mn    PDomenic Polite MD [facility]   03/14/2022, 1:09 PM

## 2022-03-14 NOTE — Progress Notes (Signed)
Mobility Specialist Progress Note:   03/14/22 1517  Mobility  Activity Transferred from bed to chair  Level of Assistance Moderate assist, patient does 50-74%  Assistive Device Other (Comment) (HHA)  Distance Ambulated (ft) 2 ft  Activity Response Tolerated well  Mobility Referral Yes  $Mobility charge 1 Mobility   During mobility: 82 HR, 98% SpO2 on RA Post-mobility: 77 HR, 96% SpO2 on RA  Pt received in bed and agreeable. No complaints. Pt returned to chair with all needs met, call bell in reach, and chair alarm on.   Andrey Campanile Mobility Specialist Please contact via SecureChat or  Rehab office at [phone]

## 2022-03-15 DIAGNOSIS — I5021 Acute systolic (congestive) heart failure: Secondary | ICD-10-CM

## 2022-03-15 LAB — BASIC METABOLIC PANEL
Anion gap: 11 (ref 5–15)
Anion gap: 9 (ref 5–15)
BUN: 18 mg/dL (ref 8–23)
BUN: 19 mg/dL (ref 8–23)
CO2: 24 mmol/L (ref 22–32)
CO2: 28 mmol/L (ref 22–32)
Calcium: 8.7 mg/dL — ABNORMAL LOW (ref 8.9–10.3)
Calcium: 8.9 mg/dL (ref 8.9–10.3)
Chloride: 97 mmol/L — ABNORMAL LOW (ref 98–111)
Chloride: 97 mmol/L — ABNORMAL LOW (ref 98–111)
Creatinine, Ser: 1.3 mg/dL — ABNORMAL HIGH (ref 0.44–1.00)
Creatinine, Ser: 1.2 mg/dL — ABNORMAL HIGH (ref 0.44–1.00)
GFR, Estimated: 38 mL/min — ABNORMAL LOW (ref >60)
GFR, Estimated: 42 mL/min — ABNORMAL LOW (ref >60)
Glucose, Bld: 91 mg/dL (ref 70–99)
Glucose, Bld: 97 mg/dL (ref 70–99)
Potassium: 5.2 mmol/L — ABNORMAL HIGH (ref 3.5–5.1)
Potassium: 4.2 mmol/L (ref 3.5–5.1)
Sodium: 132 mmol/L — ABNORMAL LOW (ref 135–145)
Sodium: 134 mmol/L — ABNORMAL LOW (ref 135–145)

## 2022-03-15 MED ORDER — FUROSEMIDE 40 MG PO TABS
40.0000 mg | ORAL_TABLET | Freq: Every day | ORAL | Status: DC
  Administered 2022-03-15: 40 mg via ORAL
  Filled 2022-03-15: qty 1

## 2022-03-15 MED ORDER — FUROSEMIDE 20 MG PO TABS: 40.0000 mg | ORAL_TABLET | Freq: Every day | ORAL | 0 refills | Status: AC

## 2022-03-15 MED ORDER — SPIRONOLACTONE 25 MG PO TABS: 25.0000 mg | ORAL_TABLET | Freq: Every day | ORAL | 0 refills | Status: AC

## 2022-03-15 MED ORDER — POTASSIUM CHLORIDE CRYS ER 20 MEQ PO TBCR: 10.0000 meq | EXTENDED_RELEASE_TABLET | Freq: Every day | ORAL | Status: AC

## 2022-03-15 MED ORDER — RISPERIDONE 1 MG PO TABS: 1.0000 mg | ORAL_TABLET | Freq: Every day | ORAL | Status: AC

## 2022-03-15 NOTE — Progress Notes (Signed)
Mobility Specialist Progress Note:   03/15/22 0910  Mobility  Activity Transferred to/from [facility]  Level of Assistance Moderate assist, patient does 50-74%  Assistive Device Front wheel walker  Distance Ambulated (ft) 3 ft  Activity Response Tolerated well  Mobility Referral Yes  $Mobility charge 1 Mobility   Pt eager for mobility session. Requested to transfer to [facility] for BM. Required modA to stand and pivot to [facility] with RW. Pt continues to prematurely sit, requiring maxA to aim hips for seat. Pt left with all needs met. Will return to transfer to chair.   Nelta Numbers Mobility Specialist Please contact via SecureChat or  Rehab office at [phone]

## 2022-03-15 NOTE — TOC Transition Note (Addendum)
Transition of Care [facility]) - CM/SW Discharge Note   Patient Details  Name: BIRDINE HANDLEY MRN: [id number] Date of Birth: [birth year]  [facility]) CM/SW Contact:  Bethena Roys, RN Phone Number: 03/15/2022, 11:52 AM   Clinical Narrative:  Case Manager spoke with daughter regarding plan of care of he patient. Family has decided to take the patient home with Hospice Services- Medicare.gov list provided to the daughter and she chose [facility]. Daughter states patient has DME: Hospital bed, Bedside commode, rolling walker, and wheelchair. Daughter wants to order a hoyer lift for the patient- ordered via [facility]. Referral submitted to Hospice Liaison and the MD will review the patient to make sure the patient is Hospice eligible. Daughter has decided for the patient to transport home via [place]. Case Manager will continue to follow for additional transition of care needs.   03-15-22 1238 Case Manager called [facility] to schedule for 1:30; several patients ahead and [facility] states they may can transport between 2:30-3:30. Case Manager made Staff and family aware. No further needs identified at this time.   Final next level of care: Home w Hospice Care Barriers to Discharge: No Barriers Identified   Patient Goals and CMS Choice CMS Medicare.gov Compare Post Acute Care list provided to:: Patient Choice offered to / list presented to : Patient  Discharge Plan and Services Additional resources added to the After Visit Summary for   In-house Referral: NA Discharge Planning Services: CM Consult Post Acute Care Choice: Hospice          DME Arranged: Other see comment (hoyer lift) DME Agency: Other - Comment ([place]) Date DME Agency Contacted: 03/15/22 Time DME Agency Contacted: [phone] Representative spoke with at DME Agency: [place]: RN Ama Date [place]: 03/15/22 Time [place]: 1151 Representative spoke with at [place]: [place] ([place]) Interventions SDOH Screenings   Depression (PHQ2-9): Low Risk  (03/08/2022)  Tobacco Use: Medium Risk (03/10/2022)     Readmission Risk Interventions    03/15/2022   11:47 AM  Readmission Risk Prevention Plan  Transportation Screening Complete  HRI or Home Care Consult Complete  Social Work Consult for [place] Planning/Counseling Complete  Palliative Care Screening Complete  Medication Review Press photographer) Referral to Pharmacy

## 2022-03-15 NOTE — Discharge Summary (Signed)
Physician Discharge Summary  Donna Arias [id number] DOB: 1926/08/07 DOA: 03/10/2022  PCP: Velva Harman, PA  Admit date: 03/10/2022 Discharge date: 03/15/2022  Time spent: 45 minutes  Recommendations for Outpatient Follow-up:  Home with hospice of the [facility]   Discharge Diagnoses:  Acute systolic CHF, severe BiV failure   Dementia with behavioral disturbance ([place])   Essential hypertension   COPD (chronic obstructive pulmonary disease) ([place])   GERD   Hypothyroidism   Chronic bilateral low back pain   Hyperlipidemia   Anxiety   Intermediate stage nonexudative age-related macular degeneration of right eye   Stage 3b chronic kidney disease (HCC)   Persistent atrial fibrillation ([place]) DO NOT RESUSCITATE  Discharge Condition: Stable  Diet recommendation: Low-sodium  Filed Weights   03/13/22 0452 03/14/22 0500 03/15/22 0330  Weight: 56.6 kg 54.3 kg 54 kg    History of present illness:  89/F with history of atrial fibrillation, CKD 3B, hypothyroidism, hypertension, CAD, COPD, anxiety, chronic pain, CVA, dementia presented to the ED with shortness of breath, recently had increasing lower extremity edema, Lasix dose increased also had some ongoing abdominal discomfort as well. -Workup in the ED noted stable vital signs, creatinine 1.2, BNP 997, chest x-ray with CHF, CTA chest noted cardiomegaly, large bilateral pleural effusions, CT abdomen pelvis was unremarkable  Hospital Course:   Acute systolic CHF, severe BiV failure Bilateral pulmonary effusions -2D echo with a EF less than 20%, RV function severely decreased, moderately elevated PA systolic pressure -Urine output has been inaccurate, weight down 9 pounds,  -Transitioned to oral Lasix and started on Aldactone -Poor prognosis in the setting of advanced age, dementia and severe BiV failure, discussed with daughter, seen by palliative care, now DNR,  -Called and updated daughter again, recommended discharge home  with hospice services, family is agreeable to this, she was set up with hosp[facility] of [place]   CKD 3B Hyponatremia -Stable   Atrial fibrillation > Known history of A-fib not currently on rate control or anticoagulation.  On account of age and falls   Hypothyroidism - Continue home Synthroid   Hyperlipidemia - Continue home atorvastatin   Intermittent abdominal pain Decreased p.o. intake -Could be from BiV failure, supportive care   GERD - Continue home PPI   Hypertension - Lasix as above -Amlodipine disc continued   CAD > Chart review 7 history of this with MI in 1998. - Continue home aspirin, atorvastatin   COPD - Replace home Stiolto with formulary Brovana and Incruse - Continue home Pulmicort - Continue as needed albuterol   History of stroke - Continue home aspirin and atorvastatin   Dementia with behavior disturbance - Continue home fluoxetine and Risperdal   Conjunctivitis - Continue erythromycin ointment  Discharge Exam: Vitals:   03/15/22 0806 03/15/22 0814  BP:  112/87  Pulse: 90 83  Resp: 19 19  Temp:  97.6 F (36.4 C)  SpO2: 96% 93%   General exam: Elderly frail chronically ill female laying in bed, awake alert oriented to self and place, cognitive deficits CVS: S1-S2, irregular rhythm Lungs: Decreased breath sounds to bases Abdomen: Soft, nontender, bowel sounds present Remedies: No edema Skin: No rashes Psychiatry: Flat affect  Discharge Instructions   Discharge Instructions     Diet - low sodium heart healthy   Complete by: As directed    Discharge wound care:   Complete by: As directed    Cleanse wounds to gluteal folds with soap and water, pat dry, apply Medihoney to wound bed,  cover with gauze and formed dressing, change daily   Face-to-face encounter (required for Medicare/Medicaid patients)   Complete by: As directed    [place] certify that this patient is under my care and that I, or a nurse practitioner or  physician's assistant working with me, had a face-to-face encounter that meets the physician face-to-face encounter requirements with this patient on 03/10/2022. The encounter with the patient was in whole, or in part for the following medical condition(s) which is the primary reason for home health care (List medical condition): dyspnea, CHF, deconditioning   The encounter with the patient was in whole, or in part, for the following medical condition, which is the primary reason for home health care: decoditioning   I certify that, based on my findings, the following services are medically necessary home health services: Nursing   Reason for Medically Necessary Home Health Services: Skilled Nursing- Change/Decline in Patient Status   My clinical findings support the need for the above services: Shortness of breath with activity   Further, I certify that my clinical findings support that this patient is homebound due to: Shortness of Breath with activity   For home use only DME Hospital bed   Complete by: As directed    Length of Need: Lifetime   Bed type: Semi-electric   Home Health   Complete by: As directed    To provide the following care/treatments:  RN PT     Increase activity slowly   Complete by: As directed       Allergies as of 03/15/2022       Reactions   Picato [ingenol Mebutate] Hives   Carrot [daucus Carota] Other (See Comments)   Unable to eat a lot of it due to eye condition per MD   Codeine Sulfate Other (See Comments)   "Made her drunk" per daughter at the bedside        Medication List     STOP taking these medications    amLODipine 2.5 MG tablet Commonly known as: NORVASC   CALCIUM 1200 PO   Cranberry 500 MG Tabs   Refresh 1.4-0.6 % Soln Generic drug: Polyvinyl Alcohol-Povidone PF   triamcinolone cream 0.1 % Commonly known as: KENALOG       TAKE these medications    aerochamber plus with mask inhaler Use with Stiolto inhalations.   albuterol  (2.5 MG/3ML) 0.083% nebulizer solution Commonly known as: PROVENTIL Take 3 mLs (2.5 mg total) by nebulization every 6 (six) hours as needed for wheezing or shortness of breath.   aspirin 81 MG tablet Take 81 mg by mouth every evening.   atorvastatin 10 MG tablet Commonly known as: LIPITOR Take 1 tablet (10 mg total) by mouth daily.   benzonatate 100 MG capsule Commonly known as: TESSALON Take 1 capsule (100 mg total) by mouth 3 (three) times daily as needed for cough.   budesonide 0.25 MG/2ML nebulizer solution Commonly known as: Pulmicort Take 2 mLs (0.25 mg total) by nebulization in the morning and at bedtime.   clotrimazole-betamethasone cream Commonly known as: LOTRISONE Apply 1 Application topically 2 (two) times daily. What changed: additional instructions   dextromethorphan-guaiFENesin 30-600 MG 12hr tablet Commonly known as: MUCINEX DM Take 1 tablet by mouth 2 (two) times daily as needed for cough.   erythromycin ophthalmic ointment Place 1 application into the left eye at bedtime. What changed:  when to take this reasons to take this   famotidine 20 MG tablet Commonly known as: PEPCID Take  20 mg by mouth every evening. Give 1 tablet every evening before supper   fexofenadine 180 MG tablet Commonly known as: ALLEGRA Take 180 mg by mouth at bedtime.   FLUoxetine 10 MG capsule Commonly known as: PROZAC TAKE 1 CAPSULE BY MOUTH DAILY.   fluticasone 50 MCG/ACT nasal spray Commonly known as: FLONASE Place 1 spray into both nostrils 2 (two) times daily. What changed:  when to take this reasons to take this   furosemide 20 MG tablet Commonly known as: LASIX Take 2 tablets (40 mg total) by mouth daily. Take 1 tablet po QD. May take 2nd tablet PO prn edema What changed:  how much to take how to take this when to take this   GAS-X PO Take 1 capsule by mouth in the morning. Give 1 capsule in the morning before breakfast   ipratropium-albuterol 0.5-2.5 (3)  MG/3ML Soln Commonly known as: DUONEB Take 3 mLs by nebulization every 4 (four) hours as needed. What changed:  when to take this reasons to take this   ketoconazole 2 % shampoo Commonly known as: NIZORAL APPLY TOPICALLY 2 TIMES A WEEK. What changed:  how much to take how to take this when to take this additional instructions   levothyroxine 50 MCG tablet Commonly known as: SYNTHROID TAKE 1 TABLET BY MOUTH EVERY DAY What changed: when to take this   levothyroxine 25 MCG tablet Commonly known as: SYNTHROID TAKE 1/2 TABLET BY MOUTH DAILY WITH 50 MCG TO EQUAL 62.5 MCG TOTAL DAILY What changed:  how much to take how to take this when to take this   loratadine 10 MG tablet Commonly known as: CLARITIN Take 10 mg by mouth daily.   mupirocin ointment 2 % Commonly known as: BACTROBAN APPLY TO AFFECTED AREA ON THE SKIN 2 TIMES A DAY What changed: See the new instructions.   nystatin powder Commonly known as: MYCOSTATIN/NYSTOP Apply 1 Application topically 2 (two) times daily. What changed: additional instructions   ondansetron 4 MG tablet Commonly known as: Zofran Take 1 tablet (4 mg total) by mouth 2 (two) times daily. What changed: additional instructions   pantoprazole 40 MG tablet Commonly known as: PROTONIX Take 1 tablet (40 mg total) by mouth daily.   potassium chloride SA 20 MEQ tablet Commonly known as: KLOR-CON M Take 0.5 tablets (10 mEq total) by mouth daily. What changed: how much to take   risperiDONE 1 MG tablet Commonly known as: RISPERDAL Take 1 tablet (1 mg total) by mouth at bedtime.   spironolactone 25 MG tablet Commonly known as: ALDACTONE Take 1 tablet (25 mg total) by mouth daily. Start taking on: March 16, 2022   Stiolto Respimat 2.5-2.5 MCG/ACT Aers Generic drug: Tiotropium Bromide-Olodaterol INHALE 2 PUFFS INTO THE LUNGS DAILY. What changed: when to take this               Durable Medical Equipment  (From admission,  onward)           Start     Ordered   03/12/22 0941  For home use only DME Bedside commode  Once       Question:  Patient needs a bedside commode to treat with the following condition  Answer:  General weakness   03/12/22 0941   03/11/22 1416  For home use only DME Hospital bed  Once       Question Answer Comment  Length of Need Lifetime   Patient has (list medical condition): CHF, dementia, CKD, afib, COPD, CVA  The above medical condition requires: Patient requires the ability to reposition frequently   Bed type Semi-electric   Support Surface: Gel Overlay      03/11/22 1416   03/11/22 1415  For home use only DME 3 n 1  Once        03/11/22 1416   03/10/22 0000  For home use only DME Hospital bed       Question Answer Comment  Length of Need Lifetime   Bed type Semi-electric      03/10/22 1418              Discharge Care Instructions  (From admission, onward)           Start     Ordered   03/15/22 0000  Discharge wound care:       Comments: Cleanse wounds to gluteal folds with soap and water, pat dry, apply Medihoney to wound bed, cover with gauze and formed dressing, change daily   03/15/22 1215           Allergies  Allergen Reactions   Picato [Ingenol Mebutate] Hives   Carrot [Daucus Carota] Other (See Comments)    Unable to eat a lot of it due to eye condition per MD   Codeine Sulfate Other (See Comments)    "Made her drunk" per daughter at the bedside    Follow-up [place] [facility] [place] [facility] up.   Why: Registered Nurse-office to call for visits. Contact information: [address] [place] [place] [phone]                 The results of significant diagnostics from this hospitalization (including imaging, microbiology, ancillary and laboratory) are listed below for reference.    Significant Diagnostic Studies: ECHOCARDIOGRAM COMPLETE  Result Date: 03/11/2022    ECHOCARDIOGRAM  REPORT   Patient Name:   Donna Arias Date of Exam: 03/11/2022 Medical Rec #:  [id number]      Height:       63.0 in Accession #:    [id number]     Weight:       131.0 lb Date of Birth:  09-17-1926      BSA:          1.615 m Patient Age:    5 years       BP:           125/82 mmHg Patient Gender: F              HR:           93 bpm. Exam Location:  Inpatient Procedure: 2D Echo, Cardiac Doppler, Color Doppler and Intracardiac            Opacification Agent Indications:    CHF  History:        Patient has no prior history of Echocardiogram examinations.                 Previous Myocardial Infarction, COPD and Stroke; Risk                 Factors:GERD, Dyslipidemia and Hypertension.  Sonographer:    Bernadene Person RDCS Referring Phys: [id number] [place]  1. Left ventricular ejection fraction, by estimation, is <20%. The left ventricle has severely decreased function. The left ventricle demonstrates global hypokinesis. Left ventricular diastolic parameters are indeterminate.  2. Right ventricular systolic function is severely reduced. The right ventricular size is mildly enlarged.  There is moderately elevated pulmonary artery systolic pressure.  3. Large pleural effusion in the left lateral region.  4. The mitral valve is normal in structure. Mild to moderate mitral valve regurgitation.  5. The aortic valve is tricuspid. Aortic valve regurgitation is mild.  6. The inferior vena cava is dilated in size with <50% respiratory variability, suggesting right atrial pressure of 15 mmHg. FINDINGS  Left Ventricle: Left ventricular ejection fraction, by estimation, is <20%. The left ventricle has severely decreased function. The left ventricle demonstrates global hypokinesis. Definity contrast agent was given IV to delineate the left ventricular endocardial borders. The left ventricular internal cavity size was normal in size. There is no left ventricular hypertrophy. Left ventricular diastolic parameters are  indeterminate. Right Ventricle: The right ventricular size is mildly enlarged. Right vetricular wall thickness was not assessed. Right ventricular systolic function is severely reduced. There is moderately elevated pulmonary artery systolic pressure. The tricuspid regurgitant velocity is 3.15 m/s, and with an assumed right atrial pressure of 15 mmHg, the estimated right ventricular systolic pressure is [id number] mmHg. Left Atrium: Left atrial size was normal in size. Right Atrium: Right atrial size was normal in size. Pericardium: Trivial pericardial effusion is present. Mitral Valve: The mitral valve is normal in structure. Mild to moderate mitral valve regurgitation. Tricuspid Valve: The tricuspid valve is normal in structure. Tricuspid valve regurgitation is mild. Aortic Valve: The aortic valve is tricuspid. Aortic valve regurgitation is mild. Aortic regurgitation PHT measures 597 msec. Pulmonic Valve: The pulmonic valve was normal in structure. Pulmonic valve regurgitation is mild. Aorta: The aortic root and ascending aorta are structurally normal, with no evidence of dilitation. Venous: The inferior vena cava is dilated in size with less than 50% respiratory variability, suggesting right atrial pressure of 15 mmHg. IAS/Shunts: No atrial level shunt detected by color flow Doppler. Additional Comments: There is a large pleural effusion in the left lateral region.  LEFT VENTRICLE PLAX 2D LVIDd:         5.30 cm LVIDs:         4.30 cm LV PW:         0.70 cm LV IVS:        0.60 cm LVOT diam:     1.80 cm LV SV:         31 LV SV Index:   19 LVOT Area:     2.54 cm  LV Volumes (MOD) LV vol d, MOD A2C: 113.0 ml LV vol d, MOD A4C: 81.9 ml LV vol s, MOD A2C: 81.9 ml LV vol s, MOD A4C: 69.0 ml LV SV MOD A2C:     31.1 ml LV SV MOD A4C:     81.9 ml LV SV MOD BP:      22.1 ml RIGHT VENTRICLE RV S prime:     7.56 cm/s TAPSE (M-mode): 1.0 cm LEFT ATRIUM             Index        RIGHT ATRIUM           Index LA diam:        3.80 cm  2.35 cm/m   RA Area:     16.50 cm LA Vol (A2C):   52.9 ml 32.75 ml/m  RA Volume:   46.20 ml  28.60 ml/m LA Vol (A4C):   49.4 ml 30.58 ml/m LA Biplane Vol: 53.0 ml 32.81 ml/m  AORTIC VALVE  PULMONIC VALVE LVOT Vmax:         93.43 cm/s  PR End Diast Vel: 10.63 msec LVOT Vmean:        55.567 cm/s LVOT VTI:          0.121 m AI PHT:            597 msec AR Vena Contracta: 0.40 cm  AORTA Ao Root diam: 2.60 cm Ao Asc diam:  3.00 cm MR PISA:        0.57 cm  TRICUSPID VALVE MR PISA Radius: 0.30 cm   TR Peak grad:   39.7 mmHg                           TR Vmax:        315.00 cm/s                            SHUNTS                           Systemic VTI:  0.12 m                           Systemic Diam: 1.80 cm Dorris Carnes MD Electronically signed by Dorris Carnes MD Signature Date/Time: 03/11/2022/5:43:20 PM    Final    CT Angio Chest PE W and/or Wo Contrast  Result Date: 03/10/2022 CLINICAL DATA:  Chest pain and right lower quadrant abdominal pain. EXAM: CT ANGIOGRAPHY CHEST CT ABDOMEN AND PELVIS WITH CONTRAST TECHNIQUE: Multidetector CT imaging of the chest was performed using the standard protocol during bolus administration of intravenous contrast. Multiplanar CT image reconstructions and MIPs were obtained to evaluate the vascular anatomy. Multidetector CT imaging of the abdomen and pelvis was performed using the standard protocol during bolus administration of intravenous contrast. RADIATION DOSE REDUCTION: This exam was performed according to the departmental dose-optimization program which includes automated exposure control, adjustment of the mA and/or kV according to patient size and/or use of iterative reconstruction technique. CONTRAST:  61m OMNIPAQUE IOHEXOL 350 MG/ML SOLN COMPARISON:  CT abdomen/pelvis 01/09/2022 and prior chest CT 06/07/2020 FINDINGS: CTA CHEST FINDINGS Cardiovascular: The heart is enlarged with four-chamber enlargement. No pericardial effusion. There is moderate tortuosity  and calcification of the thoracic aorta but no aneurysm or dissection. The pulmonary arterial tree is fairly well opacified. The main pulmonary arteries are enlarged which may suggest pulmonary hypertension. No filling defects to suggest pulmonary embolism. Mediastinum/Nodes: Borderline enlarged mediastinal and hilar lymph nodes likely due to the underlying lung findings. The esophagus is grossly normal. Lungs/Pleura: Large bilateral pleural effusions. With mild overlying right lower lobe atelectasis and moderate left lower lobe atelectasis. Hazy interstitial changes and ground-glass opacity likely reflecting asymmetric pulmonary edema. No pulmonary lesions. Musculoskeletal: No significant bony findings. Review of the MIP images confirms the above findings. CT ABDOMEN and PELVIS FINDINGS Hepatobiliary: No hepatic lesions or intrahepatic biliary dilatation. The gallbladder is surgically absent. No common bile duct dilatation. Pancreas: No mass, inflammation ductal dilatation. Moderate age related pancreatic atrophy. Spleen: Normal size.  No focal lesions. Adrenals/Urinary Tract: The adrenal glands are normal. Mild renal cortical thinning but no renal lesions or hydronephrosis. No collecting system abnormalities on the delayed images. The bladder is. Stomach/Bowel: The stomach, duodenum, small and colon are unremarkable. No acute inflammatory process, mass lesions obstructive findings. The terminal  ileum and appendix are normal. Scattered colonic diverticulosis but no findings for acute diverticulitis. Vascular/Lymphatic: Advanced atherosclerotic calcification involving the aorta and branch vessels but no aneurysm dissection. The major venous structures are patent. No mesenteric or retroperitoneal mass or adenopathy. There is a stable rim calcified density in the left upper pelvis adjacent to the descending colon which could be an area of old fat necrosis or calcified lymph node. Reproductive: The uterus and ovaries  are unremarkable. Other: Small amount of free pelvic fluid is noted. There is also mild diffuse body wall edema. Musculoskeletal: No significant bony findings. Review of the MIP images confirms the above findings. IMPRESSION: 1. No CT findings for pulmonary embolism. 2. Cardiac enlargement. 3. Large bilateral pleural effusions with overlying right lower lobe atelectasis and moderate left lower lobe atelectasis. 4. Hazy interstitial changes and ground-glass opacity likely reflecting asymmetric pulmonary edema. 5. No acute abdominal/pelvic findings, mass lesions or adenopathy. 6. Advanced atherosclerotic calcification involving the thoracic and abdominal aorta and branch vessels. 7. Aortic atherosclerosis. Aortic Atherosclerosis (ICD10-I70.0). Electronically Signed   By: Marijo Sanes M.D.   On: 03/10/2022 16:27   CT ABDOMEN PELVIS W CONTRAST  Result Date: 03/10/2022 CLINICAL DATA:  Chest pain and right lower quadrant abdominal pain. EXAM: CT ANGIOGRAPHY CHEST CT ABDOMEN AND PELVIS WITH CONTRAST TECHNIQUE: Multidetector CT imaging of the chest was performed using the standard protocol during bolus administration of intravenous contrast. Multiplanar CT image reconstructions and MIPs were obtained to evaluate the vascular anatomy. Multidetector CT imaging of the abdomen and pelvis was performed using the standard protocol during bolus administration of intravenous contrast. RADIATION DOSE REDUCTION: This exam was performed according to the departmental dose-optimization program which includes automated exposure control, adjustment of the mA and/or kV according to patient size and/or use of iterative reconstruction technique. CONTRAST:  64m OMNIPAQUE IOHEXOL 350 MG/ML SOLN COMPARISON:  CT abdomen/pelvis 01/09/2022 and prior chest CT 06/07/2020 FINDINGS: CTA CHEST FINDINGS Cardiovascular: The heart is enlarged with four-chamber enlargement. No pericardial effusion. There is moderate tortuosity and calcification of  the thoracic aorta but no aneurysm or dissection. The pulmonary arterial tree is fairly well opacified. The main pulmonary arteries are enlarged which may suggest pulmonary hypertension. No filling defects to suggest pulmonary embolism. Mediastinum/Nodes: Borderline enlarged mediastinal and hilar lymph nodes likely due to the underlying lung findings. The esophagus is grossly normal. Lungs/Pleura: Large bilateral pleural effusions. With mild overlying right lower lobe atelectasis and moderate left lower lobe atelectasis. Hazy interstitial changes and ground-glass opacity likely reflecting asymmetric pulmonary edema. No pulmonary lesions. Musculoskeletal: No significant bony findings. Review of the MIP images confirms the above findings. CT ABDOMEN and PELVIS FINDINGS Hepatobiliary: No hepatic lesions or intrahepatic biliary dilatation. The gallbladder is surgically absent. No common bile duct dilatation. Pancreas: No mass, inflammation ductal dilatation. Moderate age related pancreatic atrophy. Spleen: Normal size.  No focal lesions. Adrenals/Urinary Tract: The adrenal glands are normal. Mild renal cortical thinning but no renal lesions or hydronephrosis. No collecting system abnormalities on the delayed images. The bladder is. Stomach/Bowel: The stomach, duodenum, small and colon are unremarkable. No acute inflammatory process, mass lesions obstructive findings. The terminal ileum and appendix are normal. Scattered colonic diverticulosis but no findings for acute diverticulitis. Vascular/Lymphatic: Advanced atherosclerotic calcification involving the aorta and branch vessels but no aneurysm dissection. The major venous structures are patent. No mesenteric or retroperitoneal mass or adenopathy. There is a stable rim calcified density in the left upper pelvis adjacent to the descending colon which could be  an area of old fat necrosis or calcified lymph node. Reproductive: The uterus and ovaries are unremarkable.  Other: Small amount of free pelvic fluid is noted. There is also mild diffuse body wall edema. Musculoskeletal: No significant bony findings. Review of the MIP images confirms the above findings. IMPRESSION: 1. No CT findings for pulmonary embolism. 2. Cardiac enlargement. 3. Large bilateral pleural effusions with overlying right lower lobe atelectasis and moderate left lower lobe atelectasis. 4. Hazy interstitial changes and ground-glass opacity likely reflecting asymmetric pulmonary edema. 5. No acute abdominal/pelvic findings, mass lesions or adenopathy. 6. Advanced atherosclerotic calcification involving the thoracic and abdominal aorta and branch vessels. 7. Aortic atherosclerosis. Aortic Atherosclerosis (ICD10-I70.0). Electronically Signed   By: Marijo Sanes M.D.   On: 03/10/2022 16:27   DG Chest Portable 1 View  Result Date: 03/10/2022 CLINICAL DATA:  Shortness of breath, decreased O2 sats. EXAM: PORTABLE CHEST 1 VIEW COMPARISON:  02/01/2022 and CT chest 04/07/2020. FINDINGS: Trachea is midline. Heart is enlarged, stable. Thoracic aorta is calcified. Mid and lower lung zone airspace opacification with large left and small right pleural effusions. There may be consolidation in the left lower lobe. IMPRESSION: 1. Congestive heart failure. 2. Possible left lower lobe consolidation. Difficult to exclude pneumonia. Electronically Signed   By: Lorin Picket M.D.   On: 03/10/2022 11:41   Intravitreal Injection, Pharmacologic Agent - OD - Right Eye  Result Date: 03/01/2022 Time Out 03/01/2022. 1:48 PM. Confirmed correct patient, procedure, site, and patient consented. Anesthesia Topical anesthesia was used. Anesthetic medications included Lidocaine 2%, Proparacaine 0.5%. Procedure Preparation included 5% betadine to ocular surface, eyelid speculum. A supplied (32g) needle was used. Injection: 1.25 mg Bevacizumab 1.10m/0.05ml   Route: Intravitreal, Site: Right Eye   NDC: 50242-060-01, Lot:[id number] Expiration  date: 04/17/2022 Post-op Post injection exam found visual acuity of at least counting fingers. The patient tolerated the procedure well. There were no complications. The patient received written and verbal post procedure care education.   OCT, Retina - OU - Both Eyes  Result Date: 03/01/2022 Right Eye Quality was good. Scan locations included subfoveal. Central Foveal Thickness: 282. Progression has improved. Findings include normal foveal contour, no IRF, no SRF, retinal drusen , subretinal hyper-reflective material, intraretinal hyper-reflective material, epiretinal membrane, pigment epithelial detachment, outer retinal atrophy (stable improvement in cystic changes nasal fovea, interval improvement in central [facility] Eye Quality was good. Scan locations included nasal. Central Foveal Thickness: 193. Progression has improved. Findings include no IRF, no SRF, abnormal foveal contour, retinal drusen , subretinal hyper-reflective material, inner retinal atrophy, outer retinal atrophy (Diffuse retinal atrophy / ischemia, central SRHM / scar with surrounding cystic changes - improved). Notes *Images captured and stored on drive Diagnosis / Impression: OD: ex ARMD -- stable improvement in cystic changes nasal fovea, interval improvement in central SRHM OS: Diffuse retinal atrophy / ischemia, central SRHM / scar with surrounding cystic changes - improved Clinical management: See below Abbreviations: NFP - Normal foveal profile. CME - cystoid macular edema. PED - pigment epithelial detachment. IRF - intraretinal fluid. SRF - subretinal fluid. EZ - ellipsoid zone. ERM - epiretinal membrane. ORA - outer retinal atrophy. ORT - outer retinal tubulation. SRHM - subretinal hyper-reflective material. IRHM - intraretinal hyper-reflective material    Microbiology: No results found for this or any previous visit (from the past 240 hour(s)).   Labs: Basic Metabolic Panel: Recent Labs  Lab 03/11/22 0303 03/12/22 0257  03/13/22 0232 03/14/22 0225 03/15/22 0223 03/15/22 0757  NA 130* 131*  132* 132* 132* 134*  K 3.8 2.8* 4.3 3.3* 5.2* 4.2  CL 95* 93* 95* 94* 97* 97*  CO2 23 28 25 29 24 28  $ GLUCOSE 98 96 92 87 91 97  BUN 11 10 16 20 18 19  $ CREATININE 1.52* 1.35* 1.34* 1.31* 1.30* 1.20*  CALCIUM 8.9 8.6* 8.7* 8.6* 8.7* 8.9  MG 1.8  --   --   --   --   --    Liver Function Tests: Recent Labs  Lab 03/10/22 1140 03/11/22 0303  AST 25 19  ALT 14 12  ALKPHOS 72 69  BILITOT 1.2 1.1  PROT 5.7* 5.9*  ALBUMIN 2.9* 3.2*   Recent Labs  Lab 03/10/22 1140  LIPASE 27   No results for input(s): "AMMONIA" in the last 168 hours. CBC: Recent Labs  Lab 03/10/22 1140 03/11/22 0303 03/12/22 0257  WBC 7.5 5.6 6.3  NEUTROABS 6.2  --   --   HGB 11.6* 11.7* 11.8*  HCT 35.8* 34.1* 33.9*  MCV 91.8 87.4 87.6  PLT 168 172 154   Cardiac Enzymes: No results for input(s): "CKTOTAL", "CKMB", "CKMBINDEX", "TROPONINI" in the last 168 hours. BNP: BNP (last 3 results) Recent Labs    03/08/22 1057 03/10/22 1140  BNP 637.9* 997.4*    ProBNP (last 3 results) No results for input(s): "PROBNP" in the last 8760 hours.  CBG: No results for input(s): "GLUCAP" in the last 168 hours.     Signed:  Domenic Polite MD.  [facility] 03/15/2022, 12:16 PM

## 2022-03-15 NOTE — Care Management Important Message (Signed)
Important Message  Patient Details  Name: Donna Arias MRN: [id number] Date of Birth: [birth year]   Medicare Important Message Given:  Yes     Shelda Altes 03/15/2022, 10:23 AM

## 2022-03-15 NOTE — Progress Notes (Signed)
Mobility Specialist Progress Note:   03/15/22 1120  Mobility  Activity Transferred from chair to bed  Level of Assistance Moderate assist, patient does 50-74%  Assistive Device Front wheel walker  Distance Ambulated (ft) 3 ft  Activity Response Tolerated well  Mobility Referral Yes  $Mobility charge 1 Mobility   Pt and daughter requesting to transfer back to bed. Required modA to stand and pivot to bed with RW. Pt left with all needs met, bed alarm on.  Nelta Numbers Mobility Specialist Please contact via SecureChat or  Rehab office at [phone]

## 2022-03-21 DIAGNOSIS — B372 Candidiasis of skin and nail: Secondary | ICD-10-CM | POA: Insufficient documentation

## 2022-03-29 ENCOUNTER — Telehealth: Payer: Self-pay

## 2022-03-29 NOTE — Telephone Encounter (Signed)
Pt daughter called and canceled upcoming appts for the pt. Pt is now under Hospice care.

## 2022-03-31 ENCOUNTER — Ambulatory Visit: Payer: Medicare HMO | Admitting: Gastroenterology

## 2022-04-15 DIAGNOSIS — Z8679 Personal history of other diseases of the circulatory system: Secondary | ICD-10-CM | POA: Insufficient documentation

## 2022-04-15 DIAGNOSIS — R601 Generalized edema: Secondary | ICD-10-CM | POA: Insufficient documentation

## 2022-04-21 ENCOUNTER — Telehealth: Payer: Self-pay | Admitting: Internal Medicine

## 2022-04-21 NOTE — Telephone Encounter (Signed)
Thanks for letting me know. cLosing message

## 2022-04-21 NOTE — Telephone Encounter (Signed)
Called to set up FU app. PT is in [facility], daughter said.  Per Mel Almond sending back note to advise Dr. anytime PT no longer want/need FU appts. TY.

## 2022-04-21 NOTE — Telephone Encounter (Signed)
Dr. Chase Caller just an [facility]

## 2022-05-03 ENCOUNTER — Encounter (INDEPENDENT_AMBULATORY_CARE_PROVIDER_SITE_OTHER): Payer: Medicare HMO | Admitting: Ophthalmology

## 2022-05-12 NOTE — Telephone Encounter (Signed)
Pt was seen at office after call was made to clinic. Closing encounter.

## 2022-05-26 DEATH — deceased

## 2022-06-07 ENCOUNTER — Ambulatory Visit: Payer: Medicare HMO | Admitting: Family Medicine

## 2022-06-17 ENCOUNTER — Ambulatory Visit: Payer: Medicare HMO | Admitting: Dermatology

## 2022-07-08 ENCOUNTER — Encounter: Payer: Medicare HMO | Admitting: Dermatology

## 2022-10-28 IMAGING — CT CT CHEST HIGH RESOLUTION W/O CM
2 of 7 series · 12 of 36 positions shown, 15 images · non-contrast
Comparison: None.

CLINICAL DATA: Bibasilar crackles, wheezing, chronic sinusitis.

EXAM:
CT CHEST WITHOUT CONTRAST
TECHNIQUE: Multidetector CT imaging of the chest was performed following the
standard protocol without intravenous contrast. High resolution
imaging of the lungs, as well as inspiratory and expiratory imaging,
was performed.

[Series 5: chest 2.00 br36 s3 cor soft · coronal · 0.53mm/px · 3 of 125 slices shown]
[im 25/125  lung]
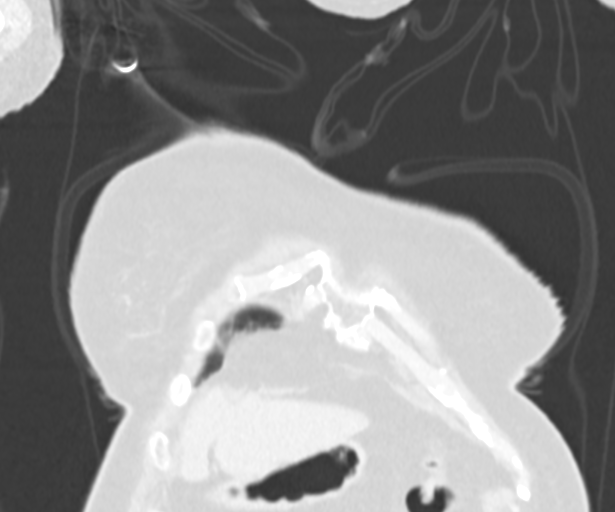
[im 50/125  lung]
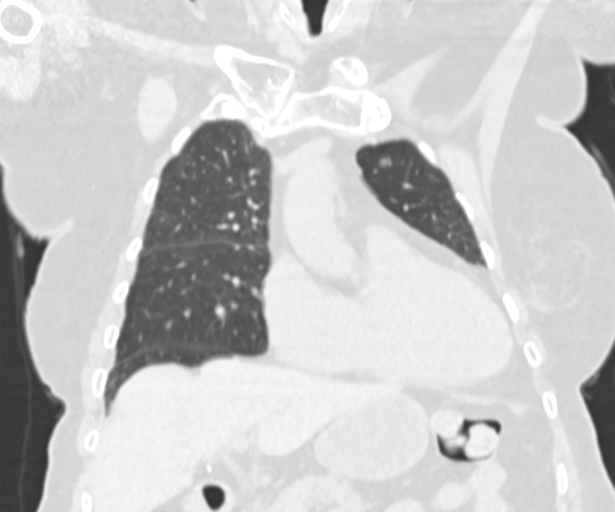
[im 75/125  lung]
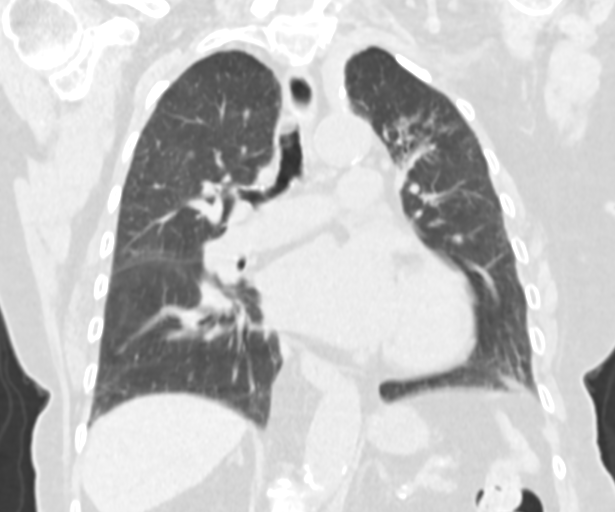

[Series 12: chest 1.00 br60 s3 high res thins 1x1 mm · axial · 0.49mm/px · z∈[-937,-717]mm · 9 of 264 slices shown, 12 images]
[im 22/264  mediastinal]
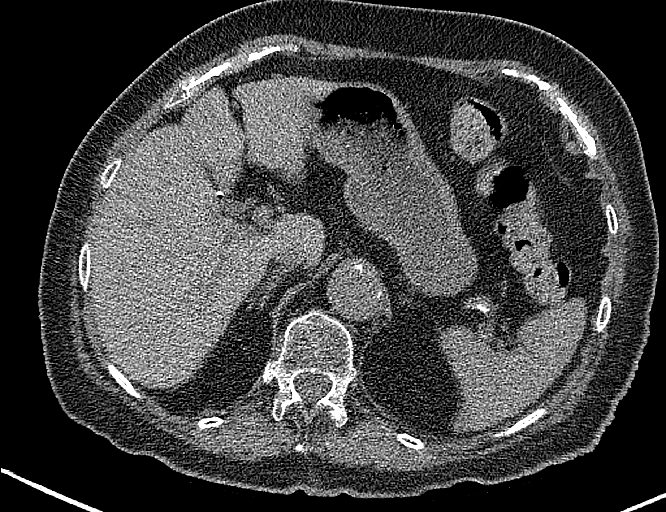
[im 22/264  lung]
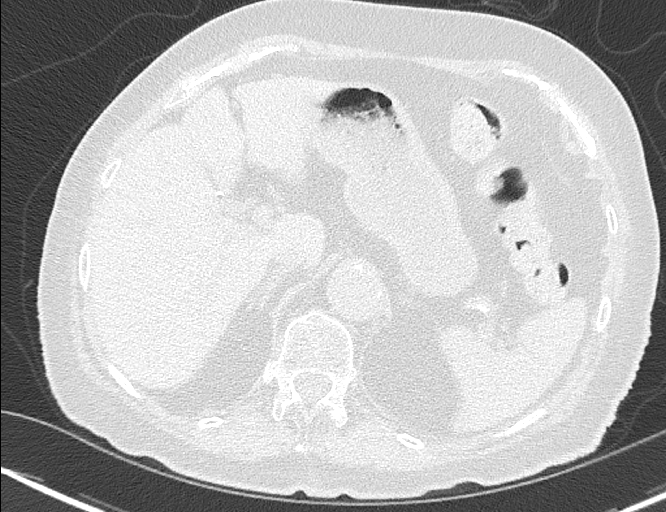
[im 44/264  lung]
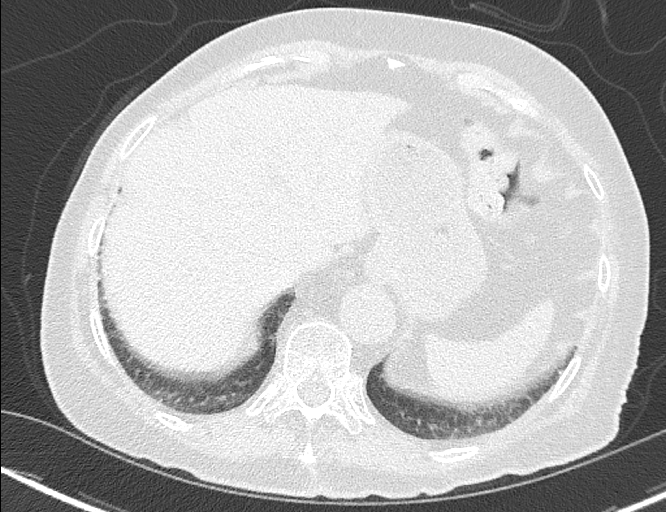
[im 88/264  lung]
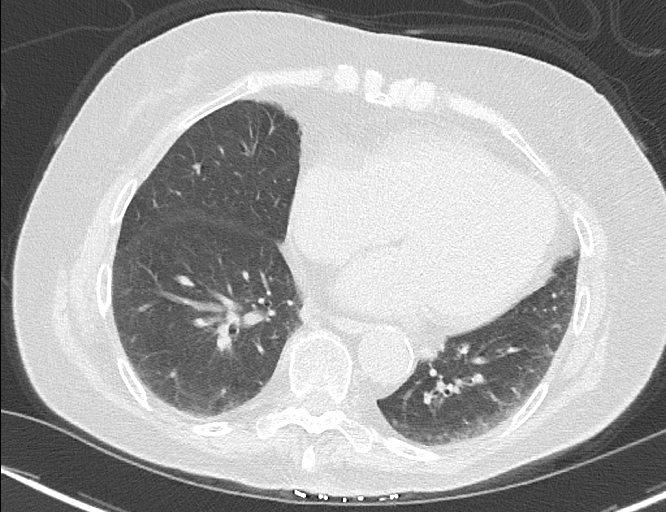
[im 110/264  lung]
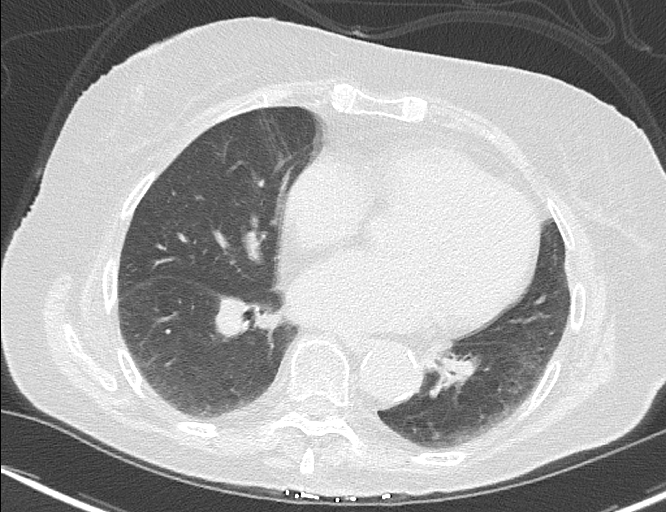
[im 132/264  mediastinal]
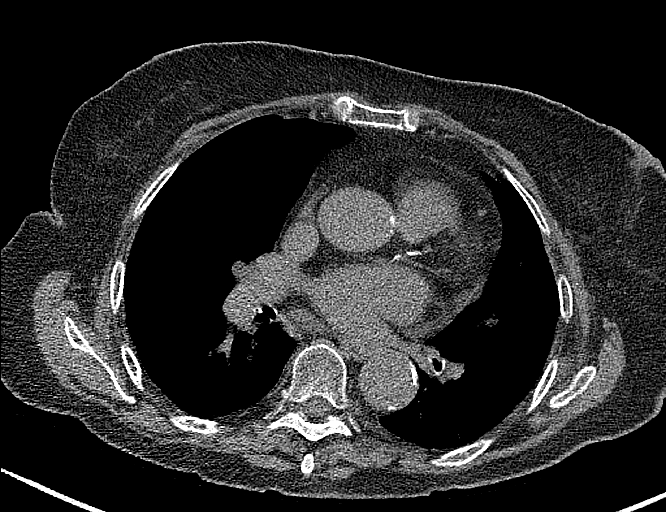
[im 132/264  lung]
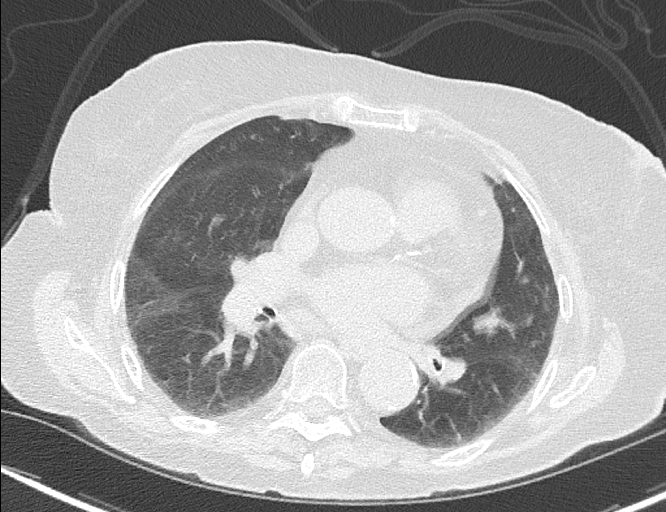
[im 154/264  lung]
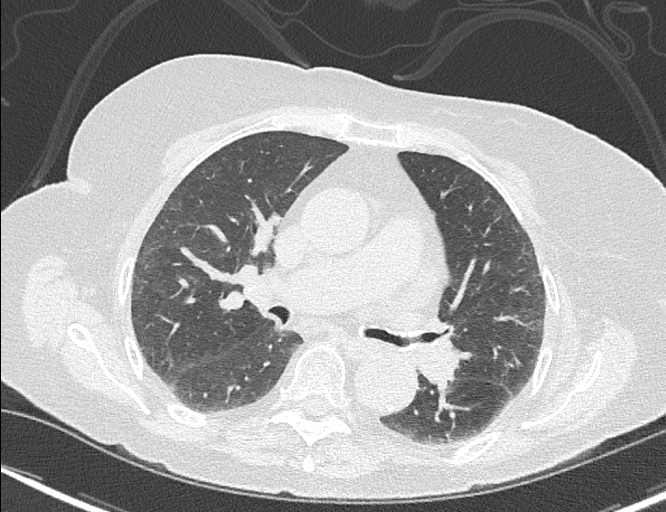
[im 176/264  lung]
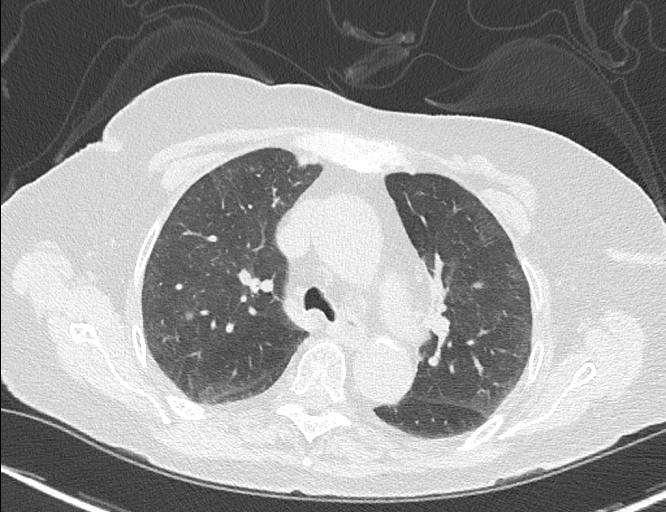
[im 220/264  lung]
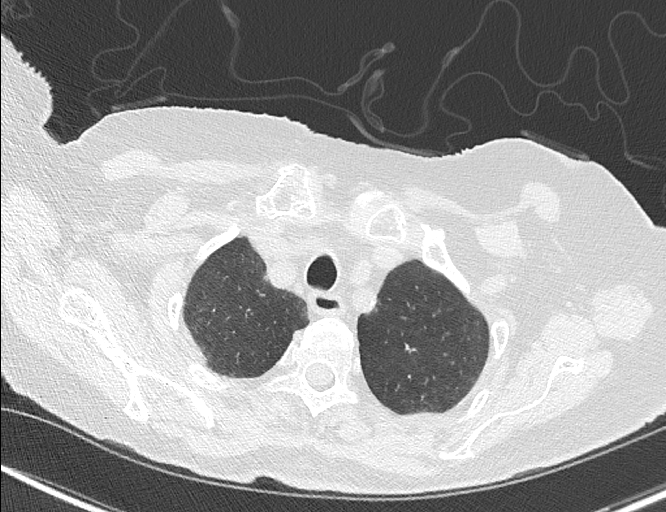
[im 242/264  mediastinal]
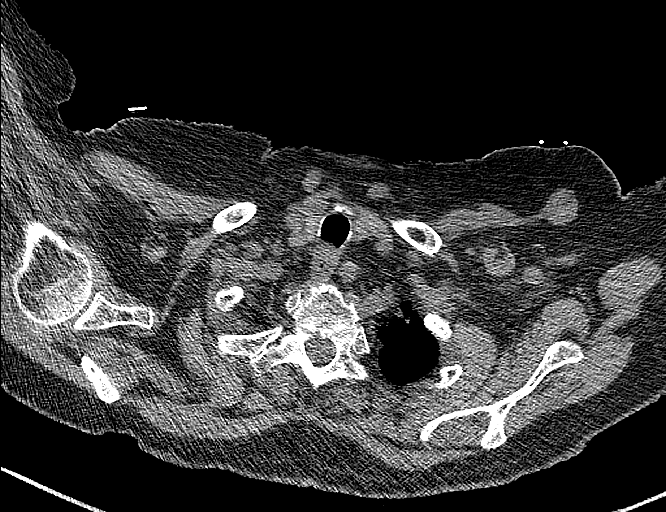
[im 242/264  lung]
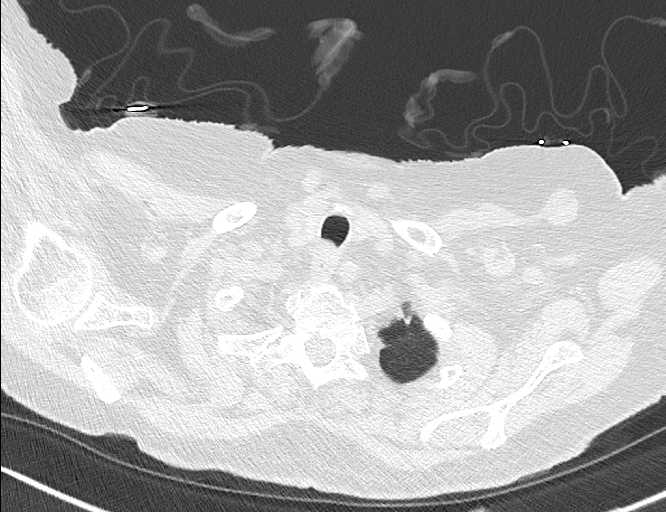

[12 of 36 positions shown; findings below may reference images not displayed]

FINDINGS: Cardiovascular: Atherosclerotic calcification of the aorta and
coronary arteries. Heart is enlarged. No pericardial effusion.

Mediastinum/Nodes: Mediastinal lymph nodes are not enlarged by CT
size criteria. Calcified hilar lymph nodes. No axillary adenopathy.
Esophagus is grossly unremarkable.

Lungs/Pleura: Image quality is somewhat degraded by respiratory
motion and expiratory phase imaging. Upper lobe predominant
ill-defined centrilobular nodularity with basilar subpleural
ground-glass. Reticulation is not an overwhelming feature. No
honeycombing. 4 mm anterior left upper lobe nodule (9/42) 4 mm right
middle lobe nodule (9/68). Calcified granulomas. Inspiratory phase
imaging was largely done in expiration, limiting the evaluation for
air trapping. No pleural fluid. Airway is unremarkable.

Upper Abdomen: Visualized portions of the liver, adrenal glands,
kidneys, spleen, pancreas, stomach and bowel are unremarkable with
exception of a tiny hiatal hernia. Cholecystectomy. No upper
abdominal adenopathy.

Musculoskeletal: Degenerative changes in the spine. No worrisome
lytic or sclerotic lesions.
IMPRESSION: 1. Image quality is degraded by respiratory motion and expiratory
phase imaging. Upper and midlung zone predominant centrilobular
nodularity with areas of subpleural ground-glass in the lung bases.
In the absence of active smoking, respiratory bronchiolitis
interstitial lung disease is excluded. Subacute hypersensitivity
pneumonitis could have this appearance. Findings are suggestive of
an alternative diagnosis (not UIP) per consensus guidelines:
Diagnosis of Idiopathic Pulmonary Fibrosis: An Official
ATS/ERS/JRS/ALAT Clinical Practice Guideline. Am J Respir Crit Care
Med Vol 198, Cherian 5, [id number][DATE].
2. Scattered pulmonary nodules measure 4 mm or less in size. These
could be followed in 1 year if the patient is at increased risk for
bronchogenic carcinoma, and at the discretion of the referring
provider, given the patient's age.
3. Aortic atherosclerosis (6DXME-O18.8). Coronary artery
calcification.

## 2023-03-03 ENCOUNTER — Encounter: Payer: Self-pay | Admitting: Family Medicine
